# Patient Record
Sex: Female | Born: 1950 | Race: White | Hispanic: No | Marital: Single | Smoking: Current every day smoker
Health system: Southern US, Community
[De-identification: ages and names within clinical notes are randomized; demographics above are authoritative.]

## PROBLEM LIST (undated history)

## (undated) DIAGNOSIS — G459 Transient cerebral ischemic attack, unspecified: Secondary | ICD-10-CM

## (undated) DIAGNOSIS — K649 Unspecified hemorrhoids: Secondary | ICD-10-CM

## (undated) DIAGNOSIS — R0602 Shortness of breath: Secondary | ICD-10-CM

## (undated) DIAGNOSIS — G8929 Other chronic pain: Secondary | ICD-10-CM

## (undated) DIAGNOSIS — IMO0001 Reserved for inherently not codable concepts without codable children: Secondary | ICD-10-CM

## (undated) DIAGNOSIS — F419 Anxiety disorder, unspecified: Secondary | ICD-10-CM

## (undated) DIAGNOSIS — M1990 Unspecified osteoarthritis, unspecified site: Secondary | ICD-10-CM

## (undated) DIAGNOSIS — K219 Gastro-esophageal reflux disease without esophagitis: Secondary | ICD-10-CM

## (undated) DIAGNOSIS — C4490 Unspecified malignant neoplasm of skin, unspecified: Secondary | ICD-10-CM

## (undated) DIAGNOSIS — M797 Fibromyalgia: Secondary | ICD-10-CM

## (undated) DIAGNOSIS — J449 Chronic obstructive pulmonary disease, unspecified: Secondary | ICD-10-CM

## (undated) DIAGNOSIS — I10 Essential (primary) hypertension: Secondary | ICD-10-CM

## (undated) DIAGNOSIS — M549 Dorsalgia, unspecified: Secondary | ICD-10-CM

## (undated) HISTORY — DX: Reserved for inherently not codable concepts without codable children: IMO0001

## (undated) HISTORY — DX: Unspecified hemorrhoids: K64.9

## (undated) HISTORY — DX: Gastro-esophageal reflux disease without esophagitis: K21.9

## (undated) HISTORY — DX: Unspecified malignant neoplasm of skin, unspecified: C44.90

## (undated) HISTORY — DX: Dorsalgia, unspecified: M54.9

## (undated) HISTORY — DX: Other chronic pain: G89.29

## (undated) HISTORY — PX: ABDOMINAL HYSTERECTOMY: SHX81

## (undated) HISTORY — PX: OTHER SURGICAL HISTORY: SHX169

---

## 2001-01-14 ENCOUNTER — Encounter: Payer: Self-pay | Admitting: Emergency Medicine

## 2001-01-14 ENCOUNTER — Emergency Department (HOSPITAL_COMMUNITY): Admission: EM | Admit: 2001-01-14 | Discharge: 2001-01-14 | Payer: Self-pay | Admitting: Emergency Medicine

## 2004-10-16 ENCOUNTER — Emergency Department (HOSPITAL_COMMUNITY): Admission: EM | Admit: 2004-10-16 | Discharge: 2004-10-16 | Payer: Self-pay | Admitting: Emergency Medicine

## 2006-04-06 ENCOUNTER — Ambulatory Visit (HOSPITAL_COMMUNITY): Admission: RE | Admit: 2006-04-06 | Discharge: 2006-04-06 | Payer: Self-pay | Admitting: Orthopaedic Surgery

## 2006-04-06 ENCOUNTER — Encounter (INDEPENDENT_AMBULATORY_CARE_PROVIDER_SITE_OTHER): Payer: Self-pay | Admitting: Specialist

## 2008-05-03 ENCOUNTER — Emergency Department (HOSPITAL_COMMUNITY): Admission: EM | Admit: 2008-05-03 | Discharge: 2008-05-03 | Payer: Self-pay | Admitting: Emergency Medicine

## 2008-05-09 ENCOUNTER — Emergency Department (HOSPITAL_COMMUNITY): Admission: EM | Admit: 2008-05-09 | Discharge: 2008-05-09 | Payer: Self-pay | Admitting: Emergency Medicine

## 2008-05-13 ENCOUNTER — Ambulatory Visit (HOSPITAL_COMMUNITY)
Admission: RE | Admit: 2008-05-13 | Discharge: 2008-05-13 | Payer: Self-pay | Admitting: Physical Medicine and Rehabilitation

## 2008-05-21 ENCOUNTER — Ambulatory Visit (HOSPITAL_COMMUNITY)
Admission: RE | Admit: 2008-05-21 | Discharge: 2008-05-21 | Payer: Self-pay | Admitting: Physical Medicine and Rehabilitation

## 2008-05-29 ENCOUNTER — Ambulatory Visit (HOSPITAL_COMMUNITY): Admission: RE | Admit: 2008-05-29 | Discharge: 2008-05-31 | Payer: Self-pay | Admitting: Orthopedic Surgery

## 2008-06-05 ENCOUNTER — Emergency Department (HOSPITAL_COMMUNITY): Admission: EM | Admit: 2008-06-05 | Discharge: 2008-06-05 | Payer: Self-pay | Admitting: Emergency Medicine

## 2008-07-16 ENCOUNTER — Ambulatory Visit (HOSPITAL_COMMUNITY): Admission: RE | Admit: 2008-07-16 | Discharge: 2008-07-16 | Payer: Self-pay | Admitting: Orthopedic Surgery

## 2008-10-13 ENCOUNTER — Encounter
Admission: RE | Admit: 2008-10-13 | Discharge: 2008-10-13 | Payer: Self-pay | Admitting: Physical Medicine and Rehabilitation

## 2008-12-07 ENCOUNTER — Ambulatory Visit (HOSPITAL_COMMUNITY): Admission: RE | Admit: 2008-12-07 | Discharge: 2008-12-07 | Payer: Self-pay | Admitting: Family Medicine

## 2009-01-15 ENCOUNTER — Emergency Department (HOSPITAL_COMMUNITY): Admission: EM | Admit: 2009-01-15 | Discharge: 2009-01-16 | Payer: Self-pay | Admitting: Psychology

## 2009-01-17 ENCOUNTER — Emergency Department (HOSPITAL_COMMUNITY): Admission: EM | Admit: 2009-01-17 | Discharge: 2009-01-17 | Payer: Self-pay | Admitting: Emergency Medicine

## 2009-06-18 DIAGNOSIS — IMO0001 Reserved for inherently not codable concepts without codable children: Secondary | ICD-10-CM

## 2009-06-18 HISTORY — DX: Reserved for inherently not codable concepts without codable children: IMO0001

## 2009-06-27 ENCOUNTER — Ambulatory Visit: Payer: Self-pay | Admitting: Cardiology

## 2009-06-27 ENCOUNTER — Observation Stay (HOSPITAL_COMMUNITY): Admission: EM | Admit: 2009-06-27 | Discharge: 2009-06-28 | Payer: Self-pay | Admitting: Emergency Medicine

## 2009-06-28 ENCOUNTER — Ambulatory Visit: Payer: Self-pay | Admitting: Cardiology

## 2009-06-28 ENCOUNTER — Encounter (INDEPENDENT_AMBULATORY_CARE_PROVIDER_SITE_OTHER): Payer: Self-pay | Admitting: Internal Medicine

## 2009-06-29 ENCOUNTER — Encounter (HOSPITAL_COMMUNITY): Admission: RE | Admit: 2009-06-29 | Discharge: 2009-06-29 | Payer: Self-pay | Admitting: Cardiology

## 2009-06-29 ENCOUNTER — Ambulatory Visit: Payer: Self-pay | Admitting: Cardiology

## 2009-06-30 ENCOUNTER — Ambulatory Visit: Payer: Self-pay | Admitting: Cardiology

## 2009-09-28 ENCOUNTER — Emergency Department (HOSPITAL_COMMUNITY)
Admission: EM | Admit: 2009-09-28 | Discharge: 2009-09-28 | Payer: Self-pay | Source: Home / Self Care | Admitting: Emergency Medicine

## 2010-04-09 ENCOUNTER — Encounter: Payer: Self-pay | Admitting: Emergency Medicine

## 2010-04-10 ENCOUNTER — Encounter: Payer: Self-pay | Admitting: Family Medicine

## 2010-04-10 ENCOUNTER — Encounter: Payer: Self-pay | Admitting: Physical Medicine and Rehabilitation

## 2010-04-10 ENCOUNTER — Encounter: Payer: Self-pay | Admitting: Orthopedic Surgery

## 2010-04-11 ENCOUNTER — Encounter: Payer: Self-pay | Admitting: Family Medicine

## 2010-04-14 ENCOUNTER — Ambulatory Visit (HOSPITAL_COMMUNITY)
Admission: RE | Admit: 2010-04-14 | Discharge: 2010-04-14 | Payer: Self-pay | Source: Home / Self Care | Attending: Gastroenterology | Admitting: Gastroenterology

## 2010-04-21 ENCOUNTER — Other Ambulatory Visit: Payer: Self-pay | Admitting: Gastroenterology

## 2010-04-21 ENCOUNTER — Other Ambulatory Visit: Payer: Self-pay

## 2010-04-21 DIAGNOSIS — R928 Other abnormal and inconclusive findings on diagnostic imaging of breast: Secondary | ICD-10-CM

## 2010-04-27 ENCOUNTER — Ambulatory Visit (HOSPITAL_COMMUNITY): Payer: Self-pay

## 2010-04-27 ENCOUNTER — Encounter (HOSPITAL_COMMUNITY): Payer: Self-pay

## 2010-05-04 ENCOUNTER — Ambulatory Visit (HOSPITAL_COMMUNITY)
Admission: RE | Admit: 2010-05-04 | Discharge: 2010-05-04 | Disposition: A | Discharge status: Home / Self Care | Payer: Self-pay | Source: Ambulatory Visit | Attending: Gastroenterology | Admitting: Gastroenterology

## 2010-05-04 ENCOUNTER — Ambulatory Visit (HOSPITAL_COMMUNITY): Payer: Self-pay

## 2010-05-04 DIAGNOSIS — R928 Other abnormal and inconclusive findings on diagnostic imaging of breast: Secondary | ICD-10-CM | POA: Insufficient documentation

## 2010-05-04 DIAGNOSIS — N63 Unspecified lump in unspecified breast: Secondary | ICD-10-CM | POA: Insufficient documentation

## 2010-06-08 LAB — CBC
HCT: 41 % (ref 36.0–46.0)
Hemoglobin: 14 g/dL (ref 12.0–15.0)
MCHC: 34.1 g/dL (ref 30.0–36.0)
MCV: 95.1 fL (ref 78.0–100.0)
Platelets: 279 10*3/uL (ref 150–400)
RBC: 4.31 MIL/uL (ref 3.87–5.11)
RDW: 16.1 % — ABNORMAL HIGH (ref 11.5–15.5)
WBC: 11.5 10*3/uL — ABNORMAL HIGH (ref 4.0–10.5)

## 2010-06-08 LAB — DIFFERENTIAL
Basophils Absolute: 0.1 10*3/uL (ref 0.0–0.1)
Basophils Relative: 1 % (ref 0–1)
Eosinophils Absolute: 0.1 10*3/uL (ref 0.0–0.7)
Eosinophils Relative: 1 % (ref 0–5)
Lymphocytes Relative: 23 % (ref 12–46)
Lymphs Abs: 2.6 10*3/uL (ref 0.7–4.0)
Monocytes Absolute: 0.9 10*3/uL (ref 0.1–1.0)
Monocytes Relative: 8 % (ref 3–12)
Neutro Abs: 7.7 10*3/uL (ref 1.7–7.7)
Neutrophils Relative %: 67 % (ref 43–77)

## 2010-06-08 LAB — BASIC METABOLIC PANEL
BUN: 11 mg/dL (ref 6–23)
BUN: 12 mg/dL (ref 6–23)
CO2: 26 mEq/L (ref 19–32)
CO2: 27 mEq/L (ref 19–32)
Calcium: 8.6 mg/dL (ref 8.4–10.5)
Calcium: 8.8 mg/dL (ref 8.4–10.5)
Chloride: 107 mEq/L (ref 96–112)
Chloride: 96 mEq/L (ref 96–112)
Creatinine, Ser: 0.79 mg/dL (ref 0.4–1.2)
Creatinine, Ser: 1.13 mg/dL (ref 0.4–1.2)
GFR calc Af Amer: >60 mL/min (ref >60)
GFR calc Af Amer: >60 mL/min (ref >60)
GFR calc non Af Amer: 50 mL/min — ABNORMAL LOW (ref >60)
GFR calc non Af Amer: >60 mL/min (ref >60)
Glucose, Bld: 135 mg/dL — ABNORMAL HIGH (ref 70–99)
Glucose, Bld: 97 mg/dL (ref 70–99)
Potassium: 3.3 mEq/L — ABNORMAL LOW (ref 3.5–5.1)
Potassium: 4.2 mEq/L (ref 3.5–5.1)
Sodium: 133 mEq/L — ABNORMAL LOW (ref 135–145)
Sodium: 139 mEq/L (ref 135–145)

## 2010-06-08 LAB — LIPID PANEL
Cholesterol: 114 mg/dL (ref 0–200)
Cholesterol: 117 mg/dL (ref 0–200)
HDL: 36 mg/dL — ABNORMAL LOW (ref >39)
HDL: 54 mg/dL (ref >39)
LDL Cholesterol: 56 mg/dL (ref 0–99)
LDL Cholesterol: 70 mg/dL (ref 0–99)
Total CHOL/HDL Ratio: 2.2 RATIO
Total CHOL/HDL Ratio: 3.2 RATIO
Triglycerides: 36 mg/dL (ref <150)
Triglycerides: 39 mg/dL (ref <150)
VLDL: 7 mg/dL (ref 0–40)
VLDL: 8 mg/dL (ref 0–40)

## 2010-06-08 LAB — TROPONIN I: Troponin I: 0.02 ng/mL (ref 0.00–0.06)

## 2010-06-08 LAB — POCT CARDIAC MARKERS
CKMB, poc: <1 ng/mL — ABNORMAL LOW (ref 1.0–8.0)
CKMB, poc: <1 ng/mL — ABNORMAL LOW (ref 1.0–8.0)
Myoglobin, poc: 38.2 ng/mL (ref 12–200)
Myoglobin, poc: 67.5 ng/mL (ref 12–200)
Troponin i, poc: <0.05 ng/mL (ref 0.00–0.09)
Troponin i, poc: <0.05 ng/mL (ref 0.00–0.09)

## 2010-06-08 LAB — CARDIAC PANEL(CRET KIN+CKTOT+MB+TROPI)
CK, MB: 0.7 ng/mL (ref 0.3–4.0)
CK, MB: 0.9 ng/mL (ref 0.3–4.0)
Relative Index: INVALID (ref 0.0–2.5)
Relative Index: INVALID (ref 0.0–2.5)
Total CK: 26 U/L (ref 7–177)
Total CK: 26 U/L (ref 7–177)
Troponin I: 0.01 ng/mL (ref 0.00–0.06)
Troponin I: <0.01 ng/mL (ref 0.00–0.06)

## 2010-06-08 LAB — MAGNESIUM: Magnesium: 2.1 mg/dL (ref 1.5–2.5)

## 2010-06-08 LAB — D-DIMER, QUANTITATIVE: D-Dimer, Quant: 0.44 ug/mL-FEU (ref 0.00–0.48)

## 2010-06-08 LAB — CK TOTAL AND CKMB (NOT AT ARMC)
CK, MB: 0.7 ng/mL (ref 0.3–4.0)
Relative Index: INVALID (ref 0.0–2.5)
Total CK: 36 U/L (ref 7–177)

## 2010-06-08 LAB — BRAIN NATRIURETIC PEPTIDE: Pro B Natriuretic peptide (BNP): <30 pg/mL (ref 0.0–100.0)

## 2010-06-08 LAB — TSH: TSH: 1.478 u[IU]/mL (ref 0.350–4.500)

## 2010-06-23 LAB — BLOOD GAS, ARTERIAL
Acid-Base Excess: 2 mmol/L (ref 0.0–2.0)
Bicarbonate: 25.2 mEq/L — ABNORMAL HIGH (ref 20.0–24.0)
FIO2: 0.21 %
O2 Saturation: 97.5 %
Patient temperature: 37
pCO2 arterial: 34 mmHg — ABNORMAL LOW (ref 35.0–45.0)
pH, Arterial: 7.483 — ABNORMAL HIGH (ref 7.350–7.400)
pO2, Arterial: 94 mmHg (ref 80.0–100.0)

## 2010-06-23 LAB — BASIC METABOLIC PANEL
BUN: 13 mg/dL (ref 6–23)
CO2: 30 mEq/L (ref 19–32)
Calcium: 9.5 mg/dL (ref 8.4–10.5)
Chloride: 102 mEq/L (ref 96–112)
Creatinine, Ser: 1.05 mg/dL (ref 0.4–1.2)
GFR calc Af Amer: >60 mL/min (ref >60)
GFR calc non Af Amer: 54 mL/min — ABNORMAL LOW (ref >60)
Glucose, Bld: 109 mg/dL — ABNORMAL HIGH (ref 70–99)
Potassium: 3.5 mEq/L (ref 3.5–5.1)
Sodium: 137 mEq/L (ref 135–145)

## 2010-06-23 LAB — POCT CARDIAC MARKERS
CKMB, poc: <1 ng/mL — ABNORMAL LOW (ref 1.0–8.0)
Myoglobin, poc: 81.5 ng/mL (ref 12–200)
Troponin i, poc: <0.05 ng/mL (ref 0.00–0.09)

## 2010-06-23 LAB — CBC
HCT: 39.9 % (ref 36.0–46.0)
Hemoglobin: 13.6 g/dL (ref 12.0–15.0)
MCHC: 34.1 g/dL (ref 30.0–36.0)
MCV: 98.2 fL (ref 78.0–100.0)
Platelets: 264 10*3/uL (ref 150–400)
RBC: 4.06 MIL/uL (ref 3.87–5.11)
RDW: 15.4 % (ref 11.5–15.5)
WBC: 8.8 10*3/uL (ref 4.0–10.5)

## 2010-06-23 LAB — DIFFERENTIAL
Basophils Absolute: 0.1 10*3/uL (ref 0.0–0.1)
Basophils Relative: 1 % (ref 0–1)
Eosinophils Absolute: 0.1 10*3/uL (ref 0.0–0.7)
Eosinophils Relative: 2 % (ref 0–5)
Lymphocytes Relative: 31 % (ref 12–46)
Lymphs Abs: 2.7 10*3/uL (ref 0.7–4.0)
Monocytes Absolute: 0.6 10*3/uL (ref 0.1–1.0)
Monocytes Relative: 7 % (ref 3–12)
Neutro Abs: 5.3 10*3/uL (ref 1.7–7.7)
Neutrophils Relative %: 60 % (ref 43–77)

## 2010-06-23 LAB — D-DIMER, QUANTITATIVE: D-Dimer, Quant: 0.47 ug/mL-FEU (ref 0.00–0.48)

## 2010-06-30 LAB — URINE CULTURE
Colony Count: 5000
Special Requests: NEGATIVE

## 2010-06-30 LAB — URINE MICROSCOPIC-ADD ON

## 2010-06-30 LAB — CBC
HCT: 41.9 % (ref 36.0–46.0)
Hemoglobin: 13.8 g/dL (ref 12.0–15.0)
MCHC: 32.9 g/dL (ref 30.0–36.0)
MCV: 98.3 fL (ref 78.0–100.0)
Platelets: 259 10*3/uL (ref 150–400)
RBC: 4.27 MIL/uL (ref 3.87–5.11)
RDW: 16.4 % — ABNORMAL HIGH (ref 11.5–15.5)
WBC: 11.4 10*3/uL — ABNORMAL HIGH (ref 4.0–10.5)

## 2010-06-30 LAB — PROTIME-INR
INR: 0.9 (ref 0.00–1.49)
Prothrombin Time: 12.3 seconds (ref 11.6–15.2)

## 2010-06-30 LAB — COMPREHENSIVE METABOLIC PANEL
ALT: 12 U/L (ref 0–35)
AST: 8 U/L (ref 0–37)
Albumin: 3.9 g/dL (ref 3.5–5.2)
Alkaline Phosphatase: 67 U/L (ref 39–117)
BUN: 19 mg/dL (ref 6–23)
CO2: 29 mEq/L (ref 19–32)
Calcium: 9.3 mg/dL (ref 8.4–10.5)
Chloride: 106 mEq/L (ref 96–112)
Creatinine, Ser: 0.98 mg/dL (ref 0.4–1.2)
GFR calc Af Amer: >60 mL/min (ref >60)
GFR calc non Af Amer: 59 mL/min — ABNORMAL LOW (ref >60)
Glucose, Bld: 93 mg/dL (ref 70–99)
Potassium: 4.6 mEq/L (ref 3.5–5.1)
Sodium: 142 mEq/L (ref 135–145)
Total Bilirubin: 0.6 mg/dL (ref 0.3–1.2)
Total Protein: 6.5 g/dL (ref 6.0–8.3)

## 2010-06-30 LAB — DIFFERENTIAL
Basophils Absolute: 0.1 10*3/uL (ref 0.0–0.1)
Basophils Relative: 1 % (ref 0–1)
Eosinophils Absolute: 0.2 10*3/uL (ref 0.0–0.7)
Eosinophils Relative: 1 % (ref 0–5)
Lymphocytes Relative: 33 % (ref 12–46)
Lymphs Abs: 3.7 10*3/uL (ref 0.7–4.0)
Monocytes Absolute: 0.9 10*3/uL (ref 0.1–1.0)
Monocytes Relative: 8 % (ref 3–12)
Neutro Abs: 6.5 10*3/uL (ref 1.7–7.7)
Neutrophils Relative %: 58 % (ref 43–77)

## 2010-06-30 LAB — URINALYSIS, ROUTINE W REFLEX MICROSCOPIC
Bilirubin Urine: NEGATIVE
Glucose, UA: NEGATIVE mg/dL
Ketones, ur: NEGATIVE mg/dL
Leukocytes, UA: NEGATIVE
Nitrite: NEGATIVE
Protein, ur: NEGATIVE mg/dL
Specific Gravity, Urine: 1.015 (ref 1.005–1.030)
Urobilinogen, UA: 1 mg/dL (ref 0.0–1.0)
pH: 6 (ref 5.0–8.0)

## 2010-06-30 LAB — APTT: aPTT: 27 seconds (ref 24–37)

## 2010-07-05 LAB — COMPREHENSIVE METABOLIC PANEL
ALT: 28 U/L (ref 0–35)
AST: 33 U/L (ref 0–37)
Albumin: 3.4 g/dL — ABNORMAL LOW (ref 3.5–5.2)
Alkaline Phosphatase: 82 U/L (ref 39–117)
BUN: 20 mg/dL (ref 6–23)
CO2: 26 mEq/L (ref 19–32)
Calcium: 9.7 mg/dL (ref 8.4–10.5)
Chloride: 105 mEq/L (ref 96–112)
Creatinine, Ser: 0.98 mg/dL (ref 0.4–1.2)
GFR calc Af Amer: >60 mL/min (ref >60)
GFR calc non Af Amer: 59 mL/min — ABNORMAL LOW (ref >60)
Glucose, Bld: 111 mg/dL — ABNORMAL HIGH (ref 70–99)
Potassium: 4.1 mEq/L (ref 3.5–5.1)
Sodium: 141 mEq/L (ref 135–145)
Total Bilirubin: 0.2 mg/dL — ABNORMAL LOW (ref 0.3–1.2)
Total Protein: 6.3 g/dL (ref 6.0–8.3)

## 2010-07-05 LAB — DIFFERENTIAL
Basophils Absolute: 0.1 10*3/uL (ref 0.0–0.1)
Basophils Relative: 1 % (ref 0–1)
Eosinophils Absolute: 0.2 10*3/uL (ref 0.0–0.7)
Eosinophils Relative: 2 % (ref 0–5)
Lymphocytes Relative: 33 % (ref 12–46)
Lymphs Abs: 3 10*3/uL (ref 0.7–4.0)
Monocytes Absolute: 0.6 10*3/uL (ref 0.1–1.0)
Monocytes Relative: 7 % (ref 3–12)
Neutro Abs: 5.2 10*3/uL (ref 1.7–7.7)
Neutrophils Relative %: 57 % (ref 43–77)

## 2010-07-05 LAB — CBC
HCT: 38 % (ref 36.0–46.0)
Hemoglobin: 13 g/dL (ref 12.0–15.0)
MCHC: 34.2 g/dL (ref 30.0–36.0)
MCV: 96.6 fL (ref 78.0–100.0)
Platelets: 236 10*3/uL (ref 150–400)
RBC: 3.94 MIL/uL (ref 3.87–5.11)
RDW: 15.9 % — ABNORMAL HIGH (ref 11.5–15.5)
WBC: 9 10*3/uL (ref 4.0–10.5)

## 2010-07-05 LAB — URINALYSIS, ROUTINE W REFLEX MICROSCOPIC
Bilirubin Urine: NEGATIVE
Glucose, UA: NEGATIVE mg/dL
Ketones, ur: NEGATIVE mg/dL
Leukocytes, UA: NEGATIVE
Nitrite: NEGATIVE
Protein, ur: NEGATIVE mg/dL
Specific Gravity, Urine: <1.005 — ABNORMAL LOW (ref 1.005–1.030)
Urobilinogen, UA: 0.2 mg/dL (ref 0.0–1.0)
pH: 5 (ref 5.0–8.0)

## 2010-07-05 LAB — D-DIMER, QUANTITATIVE: D-Dimer, Quant: 0.47 ug/mL-FEU (ref 0.00–0.48)

## 2010-07-05 LAB — BRAIN NATRIURETIC PEPTIDE: Pro B Natriuretic peptide (BNP): <30 pg/mL (ref 0.0–100.0)

## 2010-07-05 LAB — URINE MICROSCOPIC-ADD ON

## 2010-08-02 NOTE — Op Note (Signed)
Paula Long, IPOCK               ACCOUNT NO.:  [id number]   MEDICAL RECORD NO.:  [id number]          PATIENT TYPE:  OIB   LOCATION:  1615                         FACILITY:  [facility]   PHYSICIAN:  Georges Lynch. Gioffre, M.D.DATE OF BIRTH:  13-May-1950   DATE OF PROCEDURE:  05/29/2008  DATE OF DISCHARGE:                               OPERATIVE REPORT   SURGEON:  Georges Lynch. Gioffre, MD.   ASSISTANT:  Marlowe Kays, MD.   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   OPERATIONS:  1. 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  2. Microdiskectomy at L4-5 on the left.   PROCEDURE:  Under general anesthesia, routine orthopedic prep and  draping of the lower back was carried out.  She had 2 grams of IV Ancef.  At this time 2 needles were placed in the back for localization purposes  and an x-ray was taken.  At this time, an incision was made over the L4-  5 interspace, bleeders identified and cauterized.  The muscle was  separated from the lamina and spinous processes bilaterally.  Another x-  ray was taken with the markers in place.  Following that, we noted that  there was marked overlapping of the L4-5 lamina so we decided to go  centrally because of the extremely large nature of the disk and the  obliteration of the canal, so we went down and carried out our central  decompressive lumbar laminectomy in the usual fashion.  We brought the  microscope in and then gently removed the ligamentum flavum.  We then  identified the dura, we protected the dura with cottonoids, went out  laterally on the left at L4-5 to decompress the lateral recess.  We  cauterized lateral recess veins with the bipolar.  Once this was  done,  we identified the L5 root, gently retracted it and prior to doing that  we did a foraminotomy for the root but the root was quite swollen and  tense.  We gently moved the dura and held the dura in place with the  D'Errico retractor, made a cruciate incision in the posterior  longitudinal ligament and the disk literally exploded out through the  incision site.  We then utilized the nerve hooks and the Epstein  curettes and removed all the subligamentous disk rupture, compressed it  down into the disk space and completed our diskectomy.  We made multiple  passes into the space and into the subligamental space.  Once the  diskectomy was completed, we were able to easily get a hockey-stick  proximally, distally, medially and laterally.  The nerve root was free,  the dura was free, we had a nice decompression.  We thoroughly irrigated  out the area.  I injected about 10 mL of FloSeal into the wound site and  loosely applied some thrombin-soaked Gelfoam.  The wound was closed in  layered usual fashion except the deep distal part of the wound was left  partially open for drainage purposes.  Subcu was closed with 0 Vicryl,  the skin was closed with metal staples and a sterile Neosporin dressing  was applied.  She left the operating  room in satisfactory condition.  Estimated blood loss, by anesthesia,  was 75 mL.  She was placed on 2 grams IV Ancef preop and she will be  followed closely.  I will maintain her with PAS hose starting in the  recovery room and then start her on low-dose aspirin.           ______________________________  Georges Lynch. [name], M.D.     RAG/MEDQ  D:  05/29/2008  T:  05/30/2008  Job:  [id number]

## 2010-08-05 NOTE — H&P (Signed)
NAME:  Paula Long, Paula Long               ACCOUNT NO.:  [id number]   MEDICAL RECORD NO.:  [id number]          PATIENT TYPE:  AMB   LOCATION:  DAY                           FACILITY:  [facility]   PHYSICIAN:  J. Darreld Mclean, M.D. DATE OF BIRTH:  [birth year]   DATE OF ADMISSION:  DATE OF DISCHARGE:  LH                              HISTORY & PHYSICAL   CHIEF COMPLAINT:  Right hand pain and locking of the long finger.   The patient is a 60 year old female with pain and tenderness in her  right hand with locking of the right long finger. It has been present  for several months, getting progressively worse. Locks particularly  early in the morning. She has inability to completely flex the finger.  She also has a small ganglion cyst over the radial artery on the radial  side of the hand. She denies any trauma. It has been getting  progressively worse.   PAST HISTORY:  Negative. Denies any heart disease, lung disease, kidney  disease, stroke, paralysis, weakness, hypertension, diabetes, cancer,  ulcer disease, circulatory problems. She denies any allergies. She is  currently taking no medications. The patient does smoke. She does not  use alcoholic beverages. There is no family doctor. Status post ankle  surgery and D&C in the past. History of lung cancer runs in the family;  father had this. The patient lives in [place] and is not married.   VITAL SIGNS:  BP 136/82, pulse 80, respirations 16, afebrile. Height 5  foot 5, weight 179.  The patient is alert, cooperative and oriented.  HEENT:  Negative.  NECK:  Supple.  LUNGS:  Clear to P&A.  HEART:  Regular without murmur heard.  ABDOMEN:  Soft, nontender, without masses.  EXTREMITIES:  Right hand:  She has got inability to fully extend the  right long finger and difficulty with flexion. There is pain over the A1  pulley. There is some popping of the finger. There is a small ganglion  cyst the size of English peas over the right volar wrist.  Other  extremities are within normal limits.  CENTRAL NERVOUS SYSTEM:  Intact.  SKIN:  Intact.   IMPRESSION:  Trigger finger (stenosing tenosynovitis), right long  finger. Small ganglion cyst, right wrist.   She does not desire anything done to the ganglion cyst other than  possible aspiration.   The plan is to release the A1 pulley. We discussed with the patient the  planned procedure, risks and imponderables. Labs are pending.                                            ______________________________  J. Darreld Mclean, M.D.     JWK/MEDQ  D:  04/05/2006  T:  04/05/2006  Job:  [id number]

## 2010-08-05 NOTE — Op Note (Signed)
NAME:  Paula Long, GEISSINGER               ACCOUNT NO.:  [id number]   MEDICAL RECORD NO.:  [id number]          PATIENT TYPE:  AMB   LOCATION:  DAY                           FACILITY:  [facility]   PHYSICIAN:  J. Darreld Mclean, M.D. DATE OF BIRTH:  March 03, 1951   DATE OF PROCEDURE:  04/06/2006  DATE OF DISCHARGE:                               OPERATIVE REPORT   PREOPERATIVE DIAGNOSIS:  Trigger finger, right long finger, and ganglion  cyst, right volar wrist.   POSTOPERATIVE DIAGNOSIS:  Trigger finger, right long finger, and  ganglion cyst, right volar wrist.   PROCEDURE:  Release A1 pulley, right long finger, and aspiration of  ganglion cyst.   ANESTHESIA:  Bier block. Please refer to anesthesia record for  tourniquet time.   SURGEON:  Keeling.   No drains.   INDICATIONS:  The patient is a 60 year old female with locking of the  right long finger. It has been present for some time, and conservative  treatment has not been successful. She also has a ganglion cyst. She  does not want it surgically removed. I told her I would aspirate that  while we were doing the procedure for the finger. She is agreeable to  that. She understands the cyst could reoccur. Risks and imponderables of  the procedure were discussed preoperatively. She asked appropriate  questions. She appears to understand.   DESCRIPTION OF PROCEDURE:  The patient was seen in the holding area. The  right hand and right long finger was identified as the correct surgical  site. I placed a mark on the palm of the right long finger. She had  placed a mark. She was taken to the operating room. She was given Bier  block anesthesia while supine on the operating room table, hand table  attached. We had a time out reidentifying Ms. Priddy as the patient and  the right long finger as the correct surgical site. Once anesthesia had  been given, Bier block, and the patient was prepped and draped in the  usual manner. I performed a small  incision over the A1 pulley. With  careful dissection, the A1 pulley was identified. The neurovascular  bundles were identified and protected and retracted. Incision was made  in the A1 pulley with a small Beaver blade, and the volar portion of the  pulley was removed. There  __________ changes on the tendon. There was  also a small ganglion cyst coming from the area. This was removed.  Tendon sheath was opened proximally and distally, and then the finger  was extended and flexed, and there was no further locking. Neurovascular  bundles were inspected and no apparent injury. Wounds were approximated  using 3-0 nylon interrupted vertical mattress manner. Put Marcaine 0.25%  in the wound. Attention was directed to the small ganglion cyst. I  aspirated that using an 18-gauge needle. Obtained approximately 1.5 cc  of fluid, real thick  gelatinous type. Sterile dressing was applied. Bulky dressing applied.  Ace bandage applied loosely. The patient tolerated the procedure well.  Prescription for Vicodin ES given for pain. Will see in the office on  Monday for a dressing change and then approximately 10 to 2 weeks for  suture removal.           ______________________________  J. Darreld Mclean, M.D.     JWK/MEDQ  D:  04/06/2006  T:  04/06/2006  Job:  [id number]

## 2010-12-16 ENCOUNTER — Emergency Department (HOSPITAL_COMMUNITY): Payer: Medicare Other

## 2010-12-16 ENCOUNTER — Emergency Department (HOSPITAL_COMMUNITY)
Admission: EM | Admit: 2010-12-16 | Discharge: 2010-12-16 | Disposition: A | Discharge status: Home / Self Care | Payer: Medicare Other | Attending: Emergency Medicine | Admitting: Emergency Medicine

## 2010-12-16 ENCOUNTER — Encounter: Payer: Self-pay | Admitting: *Deleted

## 2010-12-16 DIAGNOSIS — M25519 Pain in unspecified shoulder: Secondary | ICD-10-CM | POA: Insufficient documentation

## 2010-12-16 DIAGNOSIS — M25511 Pain in right shoulder: Secondary | ICD-10-CM

## 2010-12-16 DIAGNOSIS — M79609 Pain in unspecified limb: Secondary | ICD-10-CM | POA: Insufficient documentation

## 2010-12-16 DIAGNOSIS — F17200 Nicotine dependence, unspecified, uncomplicated: Secondary | ICD-10-CM | POA: Insufficient documentation

## 2010-12-16 DIAGNOSIS — J4489 Other specified chronic obstructive pulmonary disease: Secondary | ICD-10-CM | POA: Insufficient documentation

## 2010-12-16 DIAGNOSIS — M542 Cervicalgia: Secondary | ICD-10-CM | POA: Insufficient documentation

## 2010-12-16 DIAGNOSIS — J449 Chronic obstructive pulmonary disease, unspecified: Secondary | ICD-10-CM | POA: Insufficient documentation

## 2010-12-16 HISTORY — DX: Chronic obstructive pulmonary disease, unspecified: J44.9

## 2010-12-16 MED ORDER — CYCLOBENZAPRINE HCL 10 MG PO TABS: 10.0000 mg | ORAL_TABLET | Freq: Three times a day (TID) | ORAL | Status: AC | PRN

## 2010-12-16 MED ORDER — DICLOFENAC SODIUM 75 MG PO TBEC: 75.0000 mg | DELAYED_RELEASE_TABLET | Freq: Two times a day (BID) | ORAL | Status: DC

## 2010-12-16 MED ORDER — MORPHINE SULFATE 10 MG/ML IJ SOLN
8.0000 mg | Freq: Once | INTRAMUSCULAR | Status: AC
Start: 2010-12-16 — End: 2010-12-16
  Administered 2010-12-16: 8 mg via INTRAMUSCULAR
  Filled 2010-12-16: qty 1

## 2010-12-16 NOTE — ED Provider Notes (Signed)
History     CSN: [id number] Arrival date & time: 12/16/2010  2:59 PM  Chief Complaint  Patient presents with  . Arm Pain    right arm    (Consider location/radiation/quality/duration/timing/severity/associated sxs/prior treatment) HPI Comments: Patient c/o pain to the right shoulder for one month.  States pain s increasing and worse with over head movements.  States the pain ranges from her right upper arm into the shoulder and to the right side of her neck.  She denies numbness, weakness, headaches, chest pain or visual changes.    Patient is a 60 y.o. female presenting with shoulder pain. The history is provided by the patient.  Shoulder Pain This is a chronic problem. The current episode started more than 1 month ago. The problem occurs constantly. The problem has been gradually worsening. Associated symptoms include neck pain. Pertinent negatives include no chest pain, fever, headaches, joint swelling, numbness, rash, sore throat, swollen glands, visual change, vomiting or weakness. Exacerbated by: movement , palpation. She has tried NSAIDs and acetaminophen for the symptoms. The treatment provided no relief.    Past Medical History  Diagnosis Date  . COPD (chronic obstructive pulmonary disease)     Past Surgical History  Procedure Date  . Abdominal hysterectomy   . Left ankle surgery   . Right wrist surgery for pinched nerve   . Trigger finger surgery   . Back surgery     History reviewed. No pertinent family history.  History  Substance Use Topics  . Smoking status: Current Everyday Smoker  . Smokeless tobacco: Not on file  . Alcohol Use: No    OB History    Grav Para Term Preterm Abortions TAB SAB Ect Mult Living                  Review of Systems  Constitutional: Negative for fever.  HENT: Positive for neck pain. Negative for sore throat and neck stiffness.   Eyes: Negative for visual disturbance.  Cardiovascular: Negative for chest pain.    Gastrointestinal: Negative for vomiting.  Musculoskeletal: Negative for back pain and joint swelling.  Skin: Negative.  Negative for rash.  Neurological: Negative for dizziness, facial asymmetry, weakness, numbness and headaches.  Hematological: Negative for adenopathy. Does not bruise/bleed easily.  All other systems reviewed and are negative.    Allergies  Percocet and Vicodin  Home Medications   Current Outpatient Rx  Name Route Sig Dispense Refill  . ACETAMINOPHEN 500 MG PO TABS Oral Take 1,000 mg by mouth as needed. For pain     . IBUPROFEN 200 MG PO TABS Oral Take 800 mg by mouth daily as needed. For pain       BP 146/81  Pulse 82  Temp(Src) 98.2 F (36.8 C) (Oral)  Resp 16  Ht 5\' 6"  (1.676 m)  Wt 173 lb (78.472 kg)  BMI 27.92 kg/m2  SpO2 98%  Physical Exam  Nursing note and vitals reviewed. Constitutional: She is oriented to person, place, and time. She appears well-developed and well-nourished. No distress.  HENT:  Head: Normocephalic and atraumatic.  Eyes: EOM are normal.  Neck: Trachea normal and normal range of motion. Neck supple. Spinous process tenderness and muscular tenderness present. No rigidity. No edema, no erythema and normal range of motion present. No Brudzinski's sign and no Kernig's sign noted.  Cardiovascular: Normal rate, regular rhythm and normal heart sounds.   Pulmonary/Chest: Effort normal and breath sounds normal.  Musculoskeletal: She exhibits tenderness. She exhibits no edema.  Right shoulder: She exhibits tenderness and pain. She exhibits normal range of motion, no bony tenderness, no swelling, no effusion, no crepitus, no deformity, no laceration, normal pulse and normal strength.  Lymphadenopathy:    She has no cervical adenopathy.  Neurological: She is alert and oriented to person, place, and time. She has normal reflexes. No cranial nerve deficit or sensory deficit. She exhibits normal muscle tone. Coordination and gait normal.   Reflex Scores:      Tricep reflexes are 2+ on the right side and 2+ on the left side.      Bicep reflexes are 2+ on the right side and 2+ on the left side.      Brachioradialis reflexes are 2+ on the right side and 2+ on the left side. Skin: Skin is warm and dry.    ED Course  Procedures (including critical care time)  Dg Cervical Spine Complete  12/16/2010  *RADIOLOGY REPORT*  Clinical Data: Neck pain  CERVICAL SPINE - COMPLETE 4+ VIEW  Comparison: None  Findings: Normal alignment of the cervical spine.  Vertebral body heights are well preserved.  Prevertebral soft tissue space is normal.  Mild disc space narrowing and endplate spurring noted at C5-6.  Neuroforamina appear patent.  No fractures or subluxations identified.  IMPRESSION:  1.  No acute findings. 2.  Mild degenerative disc disease.  Original Report Authenticated By: Rosealee Albee, M.D.   Dg Shoulder Right  12/16/2010  *RADIOLOGY REPORT*  Clinical Data: Right shoulder pain.  RIGHT SHOULDER - 2+ VIEW  Comparison: None  Findings: The joint spaces are maintained.  Mild AC joint degenerative changes.  Mild lateral downsloping of the acromion. No acute fracture.  The right lung apex is clear.  No upper right rib fractures.  IMPRESSION: No acute bony findings.  Original Report Authenticated By: P. Loralie Champagne, M.D.        MDM    Patient has ttp of the right [facility] joint and right trapezius muscles.  Has full ROM of the shoulder, pain is reproduced with full abduction and external rotation, distal sensation intact,  CR<2 sec.  Radial pulse is strong and equal bilaterally.  Diff dx would include rotator cuff syndrome vs cervical radiculopathy.  No focal neuro deficits, speech impairment  or motor weakness to suggest TIA.    Patient requests referral to f/u with Dr. Hilda Lias.    12/16/2010 Pt feels improved after observation and/or treatment in ED.   Patient / Family / Caregiver understand and agree with initial ED impression and  plan with expectations set for ED visit.         Channing Savich L. [place], [place] 12/16/10 1717

## 2010-12-16 NOTE — ED Provider Notes (Signed)
Medical screening examination/treatment/procedure(s) were performed by non-physician practitioner and as supervising physician I was immediately available for consultation/collaboration.   Dayton Bailiff, MD 12/16/10 [phone]

## 2010-12-16 NOTE — ED Notes (Signed)
Pt c/o right arm pain starting on the upper arm radiating around to shoulder and up to neck and down front to collar bone; pt states the pain has been going on for about a month and denies any injury

## 2010-12-16 NOTE — ED Notes (Signed)
Pt a/ox4. Resp even and unlabored. NAD at this time. D/C instructions and Rx x2  reviewed with pt. Pt verbalized understanding. Pt ambulated with steady gate. Sister with pt to transport home.

## 2011-06-17 ENCOUNTER — Emergency Department (HOSPITAL_COMMUNITY)
Admission: EM | Admit: 2011-06-17 | Discharge: 2011-06-17 | Disposition: A | Discharge status: Home / Self Care | Payer: Medicare Other | Attending: Emergency Medicine | Admitting: Emergency Medicine

## 2011-06-17 ENCOUNTER — Encounter (HOSPITAL_COMMUNITY): Payer: Self-pay | Admitting: *Deleted

## 2011-06-17 DIAGNOSIS — F17200 Nicotine dependence, unspecified, uncomplicated: Secondary | ICD-10-CM | POA: Insufficient documentation

## 2011-06-17 DIAGNOSIS — J4489 Other specified chronic obstructive pulmonary disease: Secondary | ICD-10-CM | POA: Insufficient documentation

## 2011-06-17 DIAGNOSIS — Z886 Allergy status to analgesic agent status: Secondary | ICD-10-CM | POA: Insufficient documentation

## 2011-06-17 DIAGNOSIS — I10 Essential (primary) hypertension: Secondary | ICD-10-CM | POA: Insufficient documentation

## 2011-06-17 DIAGNOSIS — M5430 Sciatica, unspecified side: Secondary | ICD-10-CM

## 2011-06-17 DIAGNOSIS — G8929 Other chronic pain: Secondary | ICD-10-CM | POA: Insufficient documentation

## 2011-06-17 DIAGNOSIS — J449 Chronic obstructive pulmonary disease, unspecified: Secondary | ICD-10-CM | POA: Insufficient documentation

## 2011-06-17 HISTORY — DX: Essential (primary) hypertension: I10

## 2011-06-17 MED ORDER — METHOCARBAMOL 500 MG PO TABS: ORAL_TABLET | ORAL | Status: DC

## 2011-06-17 MED ORDER — ONDANSETRON 8 MG PO TBDP
8.0000 mg | ORAL_TABLET | Freq: Once | ORAL | Status: AC
  Administered 2011-06-17: 8 mg via ORAL
  Filled 2011-06-17: qty 1

## 2011-06-17 MED ORDER — OXYCODONE-ACETAMINOPHEN 5-325 MG PO TABS: 1.0000 | ORAL_TABLET | ORAL | Status: AC | PRN

## 2011-06-17 MED ORDER — MAGIC MOUTHWASH: ORAL | Status: DC

## 2011-06-17 MED ORDER — HYDROMORPHONE HCL PF 1 MG/ML IJ SOLN
1.0000 mg | Freq: Once | INTRAMUSCULAR | Status: AC
  Administered 2011-06-17: 1 mg via INTRAMUSCULAR
  Filled 2011-06-17: qty 1

## 2011-06-17 NOTE — ED Provider Notes (Signed)
History     CSN: [id number]  Arrival date & time 06/17/11  1546   First MD Initiated Contact with Patient 06/17/11 1603      Chief Complaint  Patient presents with  . Leg Pain    (Consider location/radiation/quality/duration/timing/severity/associated sxs/prior treatment) HPI Comments: Patient with hx of chronic low back pain c/o increasing pain to her left lower back for 2-3 weeks.  States the pain has become worse today and radiating into her left thigh and knee.  States the pain has radiated into her left leg in the past.  States the pain is similar to the intense pain she felt in 2010 prior to her previous back surgery.  She denies fall or recent injury.  She denies weakness, numbness, incontinence or feces or urine, or abd pain.  States she has appt with her neurologist, Dr. Ethelene Hal, on June 26, 2011  Patient is a 61 y.o. female presenting with back pain. The history is provided by the patient. No language interpreter was used.  Back Pain  This is a chronic problem. The current episode started more than 1 week ago. The problem occurs constantly. The problem has been gradually worsening. The pain is associated with no known injury. The pain is present in the lumbar spine and sacro-iliac joint. The quality of the pain is described as aching and shooting. The pain radiates to the left thigh and left knee. The pain is moderate. The symptoms are aggravated by twisting, certain positions and bending. The pain is the same all the time. Associated symptoms include leg pain and tingling. Pertinent negatives include no chest pain, no fever, no numbness, no abdominal pain, no abdominal swelling, no bowel incontinence, no perianal numbness, no bladder incontinence, no dysuria, no pelvic pain, no paresthesias, no paresis and no weakness. She has tried NSAIDs (tylenol) for the symptoms. The treatment provided no relief.    Past Medical History  Diagnosis Date  . COPD (chronic obstructive pulmonary  disease)   . Hypertension     Past Surgical History  Procedure Date  . Abdominal hysterectomy   . Left ankle surgery   . Right wrist surgery for pinched nerve   . Trigger finger surgery   . Back surgery     History reviewed. No pertinent family history.  History  Substance Use Topics  . Smoking status: Current Everyday Smoker    Types: Cigarettes  . Smokeless tobacco: Not on file  . Alcohol Use: No    OB History    Grav Para Term Preterm Abortions TAB SAB Ect Mult Living                  Review of Systems  Constitutional: Negative for fever.  Cardiovascular: Negative for chest pain.  Gastrointestinal: Negative for abdominal pain and bowel incontinence.  Genitourinary: Negative for bladder incontinence, dysuria and pelvic pain.  Musculoskeletal: Positive for back pain.  Neurological: Positive for tingling. Negative for weakness, numbness and paresthesias.    Allergies  Percocet and Vicodin  Home Medications   Current Outpatient Rx  Name Route Sig Dispense Refill  . ACETAMINOPHEN 500 MG PO TABS Oral Take 1,000 mg by mouth as needed. For pain     . DICLOFENAC SODIUM 75 MG PO TBEC Oral Take 1 tablet (75 mg total) by mouth 2 (two) times daily. Prn pain.  Take with food 20 tablet 0  . IBUPROFEN 200 MG PO TABS Oral Take 800 mg by mouth daily as needed. For pain  BP 155/72  Pulse 90  Temp(Src) 97.5 F (36.4 C) (Oral)  Resp 18  Ht 5\' 6"  (1.676 m)  Wt 180 lb (81.647 kg)  BMI 29.05 kg/m2  SpO2 99%  Physical Exam  Nursing note and vitals reviewed. Constitutional: She is oriented to person, place, and time. She appears well-developed and well-nourished. No distress.  HENT:  Head: Normocephalic and atraumatic.  Neck: Normal range of motion. Neck supple.  Cardiovascular: Normal rate, regular rhythm and normal heart sounds.   Pulmonary/Chest: Effort normal and breath sounds normal. No respiratory distress.  Abdominal: Soft. She exhibits no distension. There is  no tenderness.  Musculoskeletal: Normal range of motion. She exhibits tenderness. She exhibits no edema.       Lumbar back: She exhibits tenderness and pain. She exhibits normal range of motion, no bony tenderness, no swelling, no edema, no deformity and normal pulse.       Back:  Neurological: She is alert and oriented to person, place, and time. No sensory deficit. She exhibits normal muscle tone. Coordination and gait normal.  Reflex Scores:      Patellar reflexes are 2+ on the right side and 2+ on the left side.      Achilles reflexes are 2+ on the right side and 2+ on the left side. Skin: Skin is warm and dry.    ED Course  Procedures (including critical care time)       MDM  I have reviewed previous ED charts and nursing notes  Patient ambulates with a cane but has a steady gait.  No focal neuro deficits on exam.  ttp of the left lumbar paraspinal muscles and left SI joint space.  Patient has hx of chronic low back pain and has appt with Dr. Ethelene Hal next week.  I will prescribe a short course of pain medication and a muscle relaxer.    Patient / Family / Caregiver understand and agree with initial ED impression and plan with expectations set for ED visit. Pt stable in ED with no significant deterioration in condition. Pt feels improved after observation and/or treatment in ED.        Paytan Recine L. [place], [place] 06/18/11 [phone]

## 2011-06-17 NOTE — ED Notes (Signed)
Left leg pain x 2-3 weeks, but today pain is unbearable per pt, denies any injury

## 2011-06-17 NOTE — Discharge Instructions (Signed)

## 2011-06-18 NOTE — ED Provider Notes (Signed)
Medical screening examination/treatment/procedure(s) were performed by non-physician practitioner and as supervising physician I was immediately available for consultation/collaboration.  Rick Warnick, MD 06/18/11 1519 

## 2011-08-04 ENCOUNTER — Other Ambulatory Visit (HOSPITAL_COMMUNITY): Payer: Self-pay | Admitting: Physical Medicine and Rehabilitation

## 2011-08-04 DIAGNOSIS — M545 Low back pain: Secondary | ICD-10-CM

## 2011-08-08 ENCOUNTER — Ambulatory Visit (HOSPITAL_COMMUNITY): Payer: Medicare Other

## 2011-08-09 ENCOUNTER — Ambulatory Visit (HOSPITAL_COMMUNITY)
Admission: RE | Admit: 2011-08-09 | Discharge: 2011-08-09 | Disposition: A | Discharge status: Home / Self Care | Payer: Medicare Other | Source: Ambulatory Visit | Attending: Physical Medicine and Rehabilitation | Admitting: Physical Medicine and Rehabilitation

## 2011-08-09 DIAGNOSIS — M545 Low back pain, unspecified: Secondary | ICD-10-CM | POA: Insufficient documentation

## 2011-08-09 DIAGNOSIS — M79609 Pain in unspecified limb: Secondary | ICD-10-CM | POA: Insufficient documentation

## 2011-08-09 DIAGNOSIS — M5126 Other intervertebral disc displacement, lumbar region: Secondary | ICD-10-CM | POA: Insufficient documentation

## 2011-08-09 LAB — POCT I-STAT, CHEM 8
BUN: 10 mg/dL (ref 6–23)
Calcium, Ion: 1.16 mmol/L (ref 1.12–1.32)
Chloride: 107 mEq/L (ref 96–112)
Creatinine, Ser: 1 mg/dL (ref 0.50–1.10)
Glucose, Bld: 96 mg/dL (ref 70–99)
HCT: 44 % (ref 36.0–46.0)
Hemoglobin: 15 g/dL (ref 12.0–15.0)
Potassium: 4.1 mEq/L (ref 3.5–5.1)
Sodium: 138 mEq/L (ref 135–145)
TCO2: 22 mmol/L (ref 0–100)

## 2011-08-09 MED ORDER — GADOBENATE DIMEGLUMINE 529 MG/ML IV SOLN: 20.0000 mL | Freq: Once | INTRAVENOUS | Status: AC | PRN

## 2011-10-26 ENCOUNTER — Ambulatory Visit: Payer: Medicare Other | Admitting: Family Medicine

## 2011-10-30 ENCOUNTER — Encounter: Payer: Self-pay | Admitting: Family Medicine

## 2011-10-30 ENCOUNTER — Ambulatory Visit (INDEPENDENT_AMBULATORY_CARE_PROVIDER_SITE_OTHER): Payer: Medicare Other | Admitting: Family Medicine

## 2011-10-30 VITALS — BP 134/76 | HR 91 | Resp 18 | Ht 65.0 in | Wt 208.0 lb

## 2011-10-30 DIAGNOSIS — E669 Obesity, unspecified: Secondary | ICD-10-CM

## 2011-10-30 DIAGNOSIS — F17210 Nicotine dependence, cigarettes, uncomplicated: Secondary | ICD-10-CM | POA: Insufficient documentation

## 2011-10-30 DIAGNOSIS — R7303 Prediabetes: Secondary | ICD-10-CM

## 2011-10-30 DIAGNOSIS — I10 Essential (primary) hypertension: Secondary | ICD-10-CM

## 2011-10-30 DIAGNOSIS — J449 Chronic obstructive pulmonary disease, unspecified: Secondary | ICD-10-CM | POA: Insufficient documentation

## 2011-10-30 DIAGNOSIS — R7309 Other abnormal glucose: Secondary | ICD-10-CM

## 2011-10-30 DIAGNOSIS — R928 Other abnormal and inconclusive findings on diagnostic imaging of breast: Secondary | ICD-10-CM

## 2011-10-30 DIAGNOSIS — G8929 Other chronic pain: Secondary | ICD-10-CM

## 2011-10-30 DIAGNOSIS — Z720 Tobacco use: Secondary | ICD-10-CM | POA: Insufficient documentation

## 2011-10-30 DIAGNOSIS — M549 Dorsalgia, unspecified: Secondary | ICD-10-CM

## 2011-10-30 DIAGNOSIS — F17200 Nicotine dependence, unspecified, uncomplicated: Secondary | ICD-10-CM

## 2011-10-30 DIAGNOSIS — K219 Gastro-esophageal reflux disease without esophagitis: Secondary | ICD-10-CM | POA: Insufficient documentation

## 2011-10-30 MED ORDER — ALBUTEROL SULFATE HFA 108 (90 BASE) MCG/ACT IN AERS: 2.0000 | INHALATION_SPRAY | RESPIRATORY_TRACT | Status: DC | PRN

## 2011-10-30 MED ORDER — ESOMEPRAZOLE MAGNESIUM 40 MG PO CPDR: 40.0000 mg | DELAYED_RELEASE_CAPSULE | Freq: Every day | ORAL | Status: DC

## 2011-10-30 MED ORDER — FLUTICASONE-SALMETEROL 250-50 MCG/DOSE IN AEPB: 1.0000 | INHALATION_SPRAY | Freq: Two times a day (BID) | RESPIRATORY_TRACT | Status: DC

## 2011-10-30 NOTE — Assessment & Plan Note (Signed)
Counseled on cessation, she has used patches in the past

## 2011-10-30 NOTE — Addendum Note (Signed)
Addended by: Milinda Antis F on: 10/30/2011 09:30 PM   Modules accepted: Orders

## 2011-10-30 NOTE — Assessment & Plan Note (Signed)
Send for repeat Mammogram as recommended with multiple nodular densities

## 2011-10-30 NOTE — Assessment & Plan Note (Signed)
Followed by pain clinic, has radicular symptoms with paresthesia

## 2011-10-30 NOTE — Assessment & Plan Note (Signed)
Labs, monitor off meds, currently bp looks good

## 2011-10-30 NOTE — Assessment & Plan Note (Signed)
Per report she has gained 75 pounds since her back surgery in 2010

## 2011-10-30 NOTE — Assessment & Plan Note (Signed)
Restart nexium 

## 2011-10-30 NOTE — Patient Instructions (Signed)
Get the labs done fasting Restart nexium You need to quit smoking! Restart advair and albuterol  I will get records  F/U 1 week for labs

## 2011-10-30 NOTE — Assessment & Plan Note (Signed)
Restart advair and albuterol, nebs as needed for flares

## 2011-10-30 NOTE — Progress Notes (Signed)
  Subjective:    Patient ID: Paula Long, female    DOB: September 13, 1950, 61 y.o.   MRN: [id number]  HPI Pt here to establish care, last PCP Dr. Loney Hering [facility]- Dr. Nobie Putnam- Dr. Ethelene Hal Medications and history reviewed Chronic back pain followed by pain clinic, on Opana, has had back surgery in the past, has radicular symptoms down left side with numbness in left foot 1st and 2nd digits for past 4 months. Pt on Disability since 2011, back surgery in 2010. Borderline diabetic, HTN with no current medications.  COPD- was on advair and albuterol but no insurance to get meds, smokes 2ppd, has wheezing episodes a few times a week Stress test done in 2011 secondary to SOB work-up- negative stress test Abnormal Mammogram- has multiple nodular densities in breast being followed, overdue for Mammogram  Review of Systems - per above    GEN- denies fatigue, fever, weight loss,weakness, recent illness HEENT- denies eye drainage, change in vision, nasal discharge, CVS- denies chest pain, palpitations RESP- denies SOB,+ cough,+ wheeze ABD- denies N/V, change in stools, abd pain GU- denies dysuria, hematuria, dribbling, incontinence MSK- + joint pain, muscle aches, injury Neuro- denies headache, dizziness, syncope, seizure activity      Objective:   Physical Exam GEN- NAD, alert and oriented x3, obesity HEENT- PERRL, EOMI, non injected sclera, pink conjunctiva, MMM, oropharynx clear Neck- Supple,  CVS- RRR, no murmur RESP-CTAB ABD-NABS,soft,NT,ND EXT- No edema Neuro- decreased sensation in left foot 1st and 2nd digits, small ulcerative callus seen on left sole x 2 , bunion of left foot Pulses- Radial, DP- 2+ Antalgic gait       Assessment & Plan:

## 2011-10-31 LAB — CBC
HCT: 43.2 % (ref 36.0–46.0)
Hemoglobin: 14.8 g/dL (ref 12.0–15.0)
MCH: 31.5 pg (ref 26.0–34.0)
MCHC: 34.3 g/dL (ref 30.0–36.0)
MCV: 91.9 fL (ref 78.0–100.0)
Platelets: 317 10*3/uL (ref 150–400)
RBC: 4.7 MIL/uL (ref 3.87–5.11)
RDW: 14.9 % (ref 11.5–15.5)
WBC: 8.2 10*3/uL (ref 4.0–10.5)

## 2011-10-31 LAB — COMPREHENSIVE METABOLIC PANEL
ALT: 10 U/L (ref 0–35)
AST: 11 U/L (ref 0–37)
Albumin: 4.1 g/dL (ref 3.5–5.2)
Alkaline Phosphatase: 79 U/L (ref 39–117)
BUN: 18 mg/dL (ref 6–23)
CO2: 27 mEq/L (ref 19–32)
Calcium: 9.7 mg/dL (ref 8.4–10.5)
Chloride: 105 mEq/L (ref 96–112)
Creat: 1.03 mg/dL (ref 0.50–1.10)
Glucose, Bld: 88 mg/dL (ref 70–99)
Potassium: 4.7 mEq/L (ref 3.5–5.3)
Sodium: 140 mEq/L (ref 135–145)
Total Bilirubin: 0.4 mg/dL (ref 0.3–1.2)
Total Protein: 7.1 g/dL (ref 6.0–8.3)

## 2011-10-31 LAB — LIPID PANEL
Cholesterol: 130 mg/dL (ref 0–200)
HDL: 49 mg/dL (ref >39)
LDL Cholesterol: 72 mg/dL (ref 0–99)
Total CHOL/HDL Ratio: 2.7 Ratio
Triglycerides: 47 mg/dL (ref <150)
VLDL: 9 mg/dL (ref 0–40)

## 2011-10-31 LAB — TSH: TSH: 2.439 u[IU]/mL (ref 0.350–4.500)

## 2011-11-01 LAB — HEMOGLOBIN A1C
Hgb A1c MFr Bld: 6.3 % — ABNORMAL HIGH (ref <5.7)
Mean Plasma Glucose: 134 mg/dL — ABNORMAL HIGH (ref <117)

## 2011-11-06 ENCOUNTER — Encounter: Payer: Self-pay | Admitting: Family Medicine

## 2011-11-06 ENCOUNTER — Ambulatory Visit: Payer: Medicare Other | Admitting: Family Medicine

## 2011-11-10 ENCOUNTER — Encounter: Payer: Self-pay | Admitting: Family Medicine

## 2011-11-10 ENCOUNTER — Ambulatory Visit (INDEPENDENT_AMBULATORY_CARE_PROVIDER_SITE_OTHER): Payer: Medicare Other | Admitting: Family Medicine

## 2011-11-10 VITALS — BP 144/76 | HR 74 | Resp 18 | Ht 65.0 in | Wt 208.0 lb

## 2011-11-10 DIAGNOSIS — Z720 Tobacco use: Secondary | ICD-10-CM

## 2011-11-10 DIAGNOSIS — F17200 Nicotine dependence, unspecified, uncomplicated: Secondary | ICD-10-CM

## 2011-11-10 DIAGNOSIS — I10 Essential (primary) hypertension: Secondary | ICD-10-CM

## 2011-11-10 DIAGNOSIS — R7303 Prediabetes: Secondary | ICD-10-CM

## 2011-11-10 DIAGNOSIS — R7309 Other abnormal glucose: Secondary | ICD-10-CM

## 2011-11-10 MED ORDER — TRAZODONE HCL 50 MG PO TABS: 50.0000 mg | ORAL_TABLET | Freq: Every day | ORAL | Status: DC

## 2011-11-10 NOTE — Progress Notes (Signed)
  Subjective:    Patient ID: Paula Long, female    DOB: [birth year], 61 y.o.   MRN: [id number]  HPI Patient here to followup blood pressure and labs.  She has no concerns today She was seen one week ago for her establishment visit. She still being followed by pain clinic. She tells me that she drinks a 2 L of soda every day.  Review of Systems - per above      Objective:   Physical Exam GEN-NAD, alert and oriented x 3 CVS- Repeat BP 140/80      Assessment & Plan:

## 2011-11-10 NOTE — Assessment & Plan Note (Signed)
We'll continue to monitor her blood pressure will not start medications at this time. Renal function ,liver function of and CBC all normal

## 2011-11-10 NOTE — Assessment & Plan Note (Signed)
She's cut back to one pack per day which is a good success for her. Hopefully she will continue to work on her smoking.

## 2011-11-10 NOTE — Assessment & Plan Note (Signed)
A1c is 6.3% we discussed her diet detail. She is to cut down to 24 ounces of soda a day she was previous to drinking 2 L a day. No meds needed

## 2011-11-10 NOTE — Patient Instructions (Signed)
Decrease the soda to 24 ounces a day  Continue to work on the smoking, congratulations on cutting back to 1 pack a day  Sleeping medication sent in  Please schedule Mammogram  Watch the sweets, junk food, avoid fast food, eat fresh or frozen veggies and fruits  F/U 4 months

## 2011-11-14 ENCOUNTER — Ambulatory Visit (HOSPITAL_COMMUNITY): Payer: Medicare Other

## 2011-11-15 ENCOUNTER — Ambulatory Visit (HOSPITAL_COMMUNITY)
Admission: RE | Admit: 2011-11-15 | Discharge: 2011-11-15 | Disposition: A | Discharge status: Home / Self Care | Payer: Medicare Other | Source: Ambulatory Visit | Attending: Family Medicine | Admitting: Family Medicine

## 2011-11-15 ENCOUNTER — Encounter (HOSPITAL_COMMUNITY): Payer: Medicare Other

## 2011-11-15 DIAGNOSIS — Z09 Encounter for follow-up examination after completed treatment for conditions other than malignant neoplasm: Secondary | ICD-10-CM | POA: Insufficient documentation

## 2011-11-15 DIAGNOSIS — R928 Other abnormal and inconclusive findings on diagnostic imaging of breast: Secondary | ICD-10-CM

## 2011-11-22 ENCOUNTER — Ambulatory Visit (HOSPITAL_COMMUNITY): Payer: Medicare Other

## 2011-12-01 ENCOUNTER — Telehealth: Payer: Self-pay | Admitting: Family Medicine

## 2011-12-01 MED ORDER — TRAZODONE HCL 100 MG PO TABS: 100.0000 mg | ORAL_TABLET | Freq: Every day | ORAL | Status: DC

## 2011-12-01 NOTE — Telephone Encounter (Signed)
Do you want to send in new rx for Trazadone 100mg ?

## 2011-12-01 NOTE — Telephone Encounter (Signed)
New dose sent

## 2011-12-04 ENCOUNTER — Other Ambulatory Visit: Payer: Self-pay | Admitting: Surgical

## 2011-12-05 ENCOUNTER — Encounter (HOSPITAL_COMMUNITY): Payer: Self-pay | Admitting: Pharmacy Technician

## 2011-12-05 NOTE — H&P (Signed)
Paula Long DOB: Feb 23, 1951  Chief Complaint: back pain  History of Present Illness The patient is a 61 year old female who comes in today for a preoperative History and Physical. The patient is scheduled for a decmpressive lumbar laminectomy L5-S1 on the right to be performed by Dr. Georges Lynch. Paula Hillock, MD at [facility] on Monday December 11, 2011 . The patient reports lumbosacral area symptoms including pain, numbness, pain with range of motion, pain with extension, pain with flexion, tingling and left leg numbness and tingling down to her toes which began 2 months ago without any known injury. The patient describes the pain as sharp, burning and tingling. Symptoms are exacerbated by standing and bending. She had a central decompressive lumbar laminectomy. She did fairly well for three years. She had a recent MRI that showed that central decompression that was done is wide open now. She had a small disc herniation on the left at L5-S1. She tried to get by with conservative measures with pain management with Dr. Ethelene Hal but was unable to.    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 06/11/2008 Hives. PERCOCET. 06/11/2008 Hives.   Family History Father. deceased age 20 due to cancer Mother. deceased age 28 due to cancer Cancer. mother, father, grandmother mothers side, grandfather mothers side, grandmother fathers side and grandfather fathers side Chronic Obstructive Lung Disease. sister Hypertension. sister, brother and child   Social History Tobacco use. current every day smoker; smoke(d) 2 1/2 pack(s) per day Tobacco / smoke exposure. yes Marital status. divorced Number of flights of stairs before winded. 2-3 Living situation. live alone Alcohol use. never  consumed alcohol Exercise. Exercises rarely Children. 2 Current work status. disabled Post-Surgical Plans. home with family Advance Directives. none   Medication History Opana (10MG  Tablet, 1 Oral ONE PO Q 6 hrs prn pain, Taken starting 10/24/2011) Active. TraZODone HCl (100MG  Tablet, Oral at bedtime) Active. Advair HFA (115-21MCG/ACT Aerosol, Inhalation as needed) Active.   Past Surgical History Spinal Surgery. central decompressive lumbar laminectomy 2010 Hysterectomy (not due to cancer) - Partial Carpal Tunnel Surgery - Right Finger Surgery. Right. trigger finger Ankle Surgery. left   Review of Systems General:Present- Night Sweats, Fatigue and Weight Gain. Not Present- Chills, Fever, Weight Loss and Memory Loss. Skin:Not Present- Hives, Itching, Rash, Eczema and Lesions. HEENT:Present- Dentures. Not Present- Tinnitus, Headache, Double Vision, Visual Loss and Hearing Loss. Respiratory:Present- Shortness of breath with exertion, Shortness of breath at rest, Chronic Cough and Wheezing. Not Present- Allergies and Coughing up blood. Cardiovascular:Present- Difficulty Breathing Lying Down. Not Present- Chest Pain, Racing/skipping heartbeats, Murmur, Swelling and Palpitations. Gastrointestinal:Not Present- Bloody Stool, Heartburn, Abdominal Pain, Vomiting, Nausea, Constipation, Diarrhea, Difficulty Swallowing, Jaundice and Loss of appetitie. Female Genitourinary:Not Present- Blood in Urine, Urinary frequency, Weak urinary stream, Discharge, Flank Pain, Incontinence, Painful Urination, Urgency, Urinary Retention and Urinating at Night. Musculoskeletal:Present- Muscle Weakness, Back Pain and Morning Stiffness. Not Present- Muscle Pain, Joint Swelling, Joint Pain and Spasms. Neurological:Present- Unsteadiness and Weakness. Not Present- Tremor, Dizziness, Blackout spells, Paralysis and Difficulty with balance. Psychiatric:Not Present- Insomnia.   Vitals Weight:  208 lb Height: 65 in Body Surface Area: 2.08 m Body Mass Index: 34.61 kg/m Pulse: 81 (Regular) Resp.: 18 (Unlabored) BP: 143/89 (Sitting, Left Arm, Standard)    Physical Exam General Mental Status - Alert, cooperative and good historian. General Appearance- pleasant. Not in acute distress. Orientation- Oriented X3.  Build & Nutrition- Overweight, Well nourished and Well developed. Head and Neck Head- normocephalic, atraumatic . Neck Global Assessment- supple. no bruit auscultated on the right and no bruit auscultated on the left. Eye Pupil- Bilateral- Regular and Round. Motion- Bilateral- EOMI. Chest and Lung Exam Auscultation: Breath sounds:Decreased- Note: distant breath sounds Adventitious sounds:- No Adventitious sounds. Cardiovascular Auscultation:Rhythm- Regular rate and rhythm. Heart Sounds- S1 WNL and S2 WNL. Murmurs & Other Heart Sounds:Auscultation of the heart reveals - No Murmurs. Abdomen Palpation/Percussion:Tenderness- Abdomen is non-tender to palpation. Rigidity (guarding)- Abdomen is soft. Auscultation:Auscultation of the abdomen reveals - Bowel sounds normal. Female Genitourinary Not done, not pertinent to present illness Peripheral Vascular Upper Extremity: Palpation:- Pulses bilaterally normal. Lower Extremity: Palpation:- Pulses bilaterally normal. Neurologic Examination of related systems reveals - normal muscle strength and tone in all extremities. Sensory: Light Touch:Decreased- Left L5 distribution. Musculoskeletal Pain with motion of the lumbar spine with pain radiating into the left leg . Positive straight leg raise on the left . Normal painless motion of the hips and knees.   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

## 2011-12-07 ENCOUNTER — Ambulatory Visit (HOSPITAL_COMMUNITY)
Admission: RE | Admit: 2011-12-07 | Discharge: 2011-12-07 | Disposition: A | Discharge status: Home / Self Care | Payer: Medicare Other | Source: Ambulatory Visit | Attending: Surgical | Admitting: Surgical

## 2011-12-07 ENCOUNTER — Encounter (HOSPITAL_COMMUNITY)
Admission: RE | Admit: 2011-12-07 | Discharge: 2011-12-07 | Disposition: A | Discharge status: Home / Self Care | Payer: Medicare Other | Source: Ambulatory Visit | Attending: Orthopedic Surgery | Admitting: Orthopedic Surgery

## 2011-12-07 ENCOUNTER — Encounter (HOSPITAL_COMMUNITY): Payer: Self-pay

## 2011-12-07 DIAGNOSIS — M1990 Unspecified osteoarthritis, unspecified site: Secondary | ICD-10-CM

## 2011-12-07 DIAGNOSIS — J449 Chronic obstructive pulmonary disease, unspecified: Secondary | ICD-10-CM

## 2011-12-07 DIAGNOSIS — Z01812 Encounter for preprocedural laboratory examination: Secondary | ICD-10-CM | POA: Insufficient documentation

## 2011-12-07 DIAGNOSIS — K219 Gastro-esophageal reflux disease without esophagitis: Secondary | ICD-10-CM | POA: Insufficient documentation

## 2011-12-07 DIAGNOSIS — J4489 Other specified chronic obstructive pulmonary disease: Secondary | ICD-10-CM | POA: Insufficient documentation

## 2011-12-07 DIAGNOSIS — I10 Essential (primary) hypertension: Secondary | ICD-10-CM

## 2011-12-07 DIAGNOSIS — M5126 Other intervertebral disc displacement, lumbar region: Secondary | ICD-10-CM | POA: Insufficient documentation

## 2011-12-07 DIAGNOSIS — Z01810 Encounter for preprocedural cardiovascular examination: Secondary | ICD-10-CM | POA: Insufficient documentation

## 2011-12-07 DIAGNOSIS — R0602 Shortness of breath: Secondary | ICD-10-CM

## 2011-12-07 DIAGNOSIS — M48061 Spinal stenosis, lumbar region without neurogenic claudication: Secondary | ICD-10-CM | POA: Insufficient documentation

## 2011-12-07 HISTORY — PX: BACK SURGERY: SHX140

## 2011-12-07 HISTORY — DX: Essential (primary) hypertension: I10

## 2011-12-07 HISTORY — DX: Shortness of breath: R06.02

## 2011-12-07 HISTORY — PX: OTHER SURGICAL HISTORY: SHX169

## 2011-12-07 HISTORY — DX: Unspecified osteoarthritis, unspecified site: M19.90

## 2011-12-07 HISTORY — DX: Chronic obstructive pulmonary disease, unspecified: J44.9

## 2011-12-07 LAB — PROTIME-INR
INR: 0.91 (ref 0.00–1.49)
Prothrombin Time: 12.2 seconds (ref 11.6–15.2)

## 2011-12-07 LAB — COMPREHENSIVE METABOLIC PANEL
ALT: 9 U/L (ref 0–35)
AST: 12 U/L (ref 0–37)
Albumin: 3.8 g/dL (ref 3.5–5.2)
Alkaline Phosphatase: 78 U/L (ref 39–117)
BUN: 16 mg/dL (ref 6–23)
CO2: 28 mEq/L (ref 19–32)
Calcium: 9.7 mg/dL (ref 8.4–10.5)
Chloride: 100 mEq/L (ref 96–112)
Creatinine, Ser: 1.08 mg/dL (ref 0.50–1.10)
GFR calc Af Amer: 64 mL/min — ABNORMAL LOW (ref >90)
GFR calc non Af Amer: 55 mL/min — ABNORMAL LOW (ref >90)
Glucose, Bld: 97 mg/dL (ref 70–99)
Potassium: 4.2 mEq/L (ref 3.5–5.1)
Sodium: 136 mEq/L (ref 135–145)
Total Bilirubin: 0.2 mg/dL — ABNORMAL LOW (ref 0.3–1.2)
Total Protein: 7.5 g/dL (ref 6.0–8.3)

## 2011-12-07 LAB — URINE MICROSCOPIC-ADD ON

## 2011-12-07 LAB — CBC
HCT: 43.5 % (ref 36.0–46.0)
Hemoglobin: 14.4 g/dL (ref 12.0–15.0)
MCH: 30.7 pg (ref 26.0–34.0)
MCHC: 33.1 g/dL (ref 30.0–36.0)
MCV: 92.8 fL (ref 78.0–100.0)
Platelets: 283 10*3/uL (ref 150–400)
RBC: 4.69 MIL/uL (ref 3.87–5.11)
RDW: 14.9 % (ref 11.5–15.5)
WBC: 8.5 10*3/uL (ref 4.0–10.5)

## 2011-12-07 LAB — URINALYSIS, ROUTINE W REFLEX MICROSCOPIC
Bilirubin Urine: NEGATIVE
Glucose, UA: NEGATIVE mg/dL
Ketones, ur: NEGATIVE mg/dL
Nitrite: NEGATIVE
Protein, ur: NEGATIVE mg/dL
Specific Gravity, Urine: 1.012 (ref 1.005–1.030)
Urobilinogen, UA: 0.2 mg/dL (ref 0.0–1.0)
pH: 6 (ref 5.0–8.0)

## 2011-12-07 LAB — SURGICAL PCR SCREEN
MRSA, PCR: NEGATIVE
Staphylococcus aureus: NEGATIVE

## 2011-12-07 LAB — APTT: aPTT: 29 seconds (ref 24–37)

## 2011-12-07 NOTE — Pre-Procedure Instructions (Addendum)
12-07-11 EKG/CXR/Lumbar spine done today. Dr. Leta Jungling given history update- will see preop  Day of surgery.W. Angelina Venard,RN 12-08-11 1520 Urinalysis result faxed to Dr. Jeannetta Ellis office.W. Kennon Portela

## 2011-12-07 NOTE — Patient Instructions (Addendum)
20 KALEE BROXTON  12/07/2011   Your procedure is scheduled on:   12-11-2011  Report to [facility] at     12:00   PM.  Call this number if you have problems the morning of surgery: [phone]  Or Presurgical Testing [phone](Feras Gardella)   Remember:   Do not eat food:After Midnight.  May have clear liquids:up to 6 Hours before arrival. Nothing after : 0800 AM  Clear liquids include soda, tea, black coffee, apple or grape juice, broth.  Take these medicines the morning of surgery with A SIP OF WATER: Nexium, Opano . Use Advair and /bring Albeterol and Advair.   Do not wear jewelry, make-up or nail polish.  Do not wear lotions, powders, or perfumes. You may wear deodorant.  Do not shave 48 hours prior to surgery.(face and neck okay, no shaving of legs)  Do not bring valuables to the hospital.  Contacts, dentures or bridgework may not be worn into surgery.  Leave suitcase in the car. After surgery it may be brought to your room.  For patients admitted to the hospital, checkout time is 11:00 AM the day of discharge.   Patients discharged the day of surgery will not be allowed to drive home. Must have responsible person with you x 24 hours once discharged.  Name and phone number of your driver: Cade Olberding, sister [phone]  Special Instructions: CHG Shower Use Special Wash: 1/2 bottle night before surgery and 1/2 bottle morning of surgery.(avoid face and genitals)-see special instructions.   Please read over the following fact sheets that you were given: MRSA Information, Blood Transfusion fact sheet, Incentive Spirometry Instruction.

## 2011-12-11 ENCOUNTER — Inpatient Hospital Stay (HOSPITAL_COMMUNITY)
Admission: RE | Admit: 2011-12-11 | Discharge: 2011-12-13 | DRG: 491 | Disposition: A | Discharge status: Home / Self Care | Payer: Medicare Other | Source: Ambulatory Visit | Attending: Orthopedic Surgery | Admitting: Orthopedic Surgery

## 2011-12-11 ENCOUNTER — Ambulatory Visit (HOSPITAL_COMMUNITY): Payer: Medicare Other

## 2011-12-11 ENCOUNTER — Encounter (HOSPITAL_COMMUNITY): Payer: Self-pay | Admitting: *Deleted

## 2011-12-11 ENCOUNTER — Encounter (HOSPITAL_COMMUNITY)
Admission: RE | Disposition: A | Discharge status: Home / Self Care | Payer: Self-pay | Source: Ambulatory Visit | Attending: Orthopedic Surgery

## 2011-12-11 ENCOUNTER — Encounter (HOSPITAL_COMMUNITY): Payer: Self-pay | Admitting: Anesthesiology

## 2011-12-11 ENCOUNTER — Ambulatory Visit (HOSPITAL_COMMUNITY): Payer: Medicare Other | Admitting: Anesthesiology

## 2011-12-11 DIAGNOSIS — G988 Other disorders of nervous system: Secondary | ICD-10-CM | POA: Diagnosis present

## 2011-12-11 DIAGNOSIS — I10 Essential (primary) hypertension: Secondary | ICD-10-CM | POA: Diagnosis present

## 2011-12-11 DIAGNOSIS — M216X9 Other acquired deformities of unspecified foot: Secondary | ICD-10-CM | POA: Diagnosis present

## 2011-12-11 DIAGNOSIS — Z79899 Other long term (current) drug therapy: Secondary | ICD-10-CM

## 2011-12-11 DIAGNOSIS — M961 Postlaminectomy syndrome, not elsewhere classified: Secondary | ICD-10-CM | POA: Diagnosis present

## 2011-12-11 DIAGNOSIS — J449 Chronic obstructive pulmonary disease, unspecified: Secondary | ICD-10-CM | POA: Diagnosis present

## 2011-12-11 DIAGNOSIS — F3289 Other specified depressive episodes: Secondary | ICD-10-CM | POA: Diagnosis present

## 2011-12-11 DIAGNOSIS — Z6834 Body mass index (BMI) 34.0-34.9, adult: Secondary | ICD-10-CM

## 2011-12-11 DIAGNOSIS — J4489 Other specified chronic obstructive pulmonary disease: Secondary | ICD-10-CM | POA: Diagnosis present

## 2011-12-11 DIAGNOSIS — K219 Gastro-esophageal reflux disease without esophagitis: Secondary | ICD-10-CM | POA: Diagnosis present

## 2011-12-11 DIAGNOSIS — G8929 Other chronic pain: Secondary | ICD-10-CM | POA: Diagnosis present

## 2011-12-11 DIAGNOSIS — Z90710 Acquired absence of both cervix and uterus: Secondary | ICD-10-CM

## 2011-12-11 DIAGNOSIS — E663 Overweight: Secondary | ICD-10-CM | POA: Diagnosis present

## 2011-12-11 DIAGNOSIS — M48062 Spinal stenosis, lumbar region with neurogenic claudication: Principal | ICD-10-CM | POA: Diagnosis present

## 2011-12-11 DIAGNOSIS — F17200 Nicotine dependence, unspecified, uncomplicated: Secondary | ICD-10-CM | POA: Diagnosis present

## 2011-12-11 DIAGNOSIS — Z9889 Other specified postprocedural states: Secondary | ICD-10-CM

## 2011-12-11 DIAGNOSIS — IMO0002 Reserved for concepts with insufficient information to code with codable children: Secondary | ICD-10-CM | POA: Diagnosis present

## 2011-12-11 DIAGNOSIS — F329 Major depressive disorder, single episode, unspecified: Secondary | ICD-10-CM | POA: Diagnosis present

## 2011-12-11 DIAGNOSIS — Z885 Allergy status to narcotic agent status: Secondary | ICD-10-CM

## 2011-12-11 HISTORY — PX: LUMBAR LAMINECTOMY/DECOMPRESSION MICRODISCECTOMY: SHX5026

## 2011-12-11 LAB — TYPE AND SCREEN
ABO/RH(D): O POS
Antibody Screen: NEGATIVE

## 2011-12-11 LAB — ABO/RH: ABO/RH(D): O POS

## 2011-12-11 SURGERY — LUMBAR LAMINECTOMY/DECOMPRESSION MICRODISCECTOMY
Anesthesia: General | Site: Back | Laterality: Left | Wound class: Clean

## 2011-12-11 MED ORDER — HYDROMORPHONE HCL 2 MG PO TABS
2.0000 mg | ORAL_TABLET | ORAL | Status: DC | PRN
  Administered 2011-12-11 – 2011-12-13 (×7): 2 mg via ORAL
  Filled 2011-12-11 (×7): qty 1

## 2011-12-11 MED ORDER — POLYETHYLENE GLYCOL 3350 17 G PO PACK: 17.0000 g | PACK | Freq: Every day | ORAL | Status: DC | PRN

## 2011-12-11 MED ORDER — ONDANSETRON HCL 4 MG/2ML IJ SOLN: 4.0000 mg | INTRAMUSCULAR | Status: DC | PRN

## 2011-12-11 MED ORDER — DEXAMETHASONE SODIUM PHOSPHATE 10 MG/ML IJ SOLN
INTRAMUSCULAR | Status: DC | PRN
  Administered 2011-12-11: 10 mg via INTRAVENOUS

## 2011-12-11 MED ORDER — BUPIVACAINE LIPOSOME 1.3 % IJ SUSP
20.0000 mL | Freq: Once | INTRAMUSCULAR | Status: AC
  Administered 2011-12-11: 20 mL
  Filled 2011-12-11: qty 20

## 2011-12-11 MED ORDER — SODIUM CHLORIDE 0.9 % IR SOLN
Status: DC | PRN
  Administered 2011-12-11: 15:00:00

## 2011-12-11 MED ORDER — CEFAZOLIN SODIUM-DEXTROSE 2-3 GM-% IV SOLR
2.0000 g | INTRAVENOUS | Status: AC
  Administered 2011-12-11: 2 g via INTRAVENOUS

## 2011-12-11 MED ORDER — ONDANSETRON HCL 4 MG/2ML IJ SOLN
INTRAMUSCULAR | Status: DC | PRN
  Administered 2011-12-11: 4 mg via INTRAVENOUS

## 2011-12-11 MED ORDER — SUCCINYLCHOLINE CHLORIDE 20 MG/ML IJ SOLN
INTRAMUSCULAR | Status: DC | PRN
  Administered 2011-12-11: 100 mg via INTRAVENOUS

## 2011-12-11 MED ORDER — METHOCARBAMOL 500 MG PO TABS
500.0000 mg | ORAL_TABLET | Freq: Four times a day (QID) | ORAL | Status: DC | PRN
  Administered 2011-12-12 – 2011-12-13 (×2): 500 mg via ORAL
  Filled 2011-12-11 (×3): qty 1

## 2011-12-11 MED ORDER — ALBUTEROL SULFATE 0.63 MG/3ML IN NEBU: 1.0000 | INHALATION_SOLUTION | Freq: Four times a day (QID) | RESPIRATORY_TRACT | Status: DC | PRN

## 2011-12-11 MED ORDER — THROMBIN 5000 UNITS EX SOLR
CUTANEOUS | Status: DC | PRN
  Administered 2011-12-11: 5000 [IU] via TOPICAL

## 2011-12-11 MED ORDER — LACTATED RINGERS IV SOLN
INTRAVENOUS | Status: DC
  Administered 2011-12-11: 1000 mL via INTRAVENOUS
  Administered 2011-12-11: 15:00:00 via INTRAVENOUS

## 2011-12-11 MED ORDER — LACTATED RINGERS IV SOLN: INTRAVENOUS | Status: DC

## 2011-12-11 MED ORDER — BUPIVACAINE-EPINEPHRINE 0.25% -1:200000 IJ SOLN
INTRAMUSCULAR | Status: DC | PRN
  Administered 2011-12-11: 30 mL

## 2011-12-11 MED ORDER — HYDROMORPHONE HCL PF 1 MG/ML IJ SOLN
INTRAMUSCULAR | Status: AC
  Filled 2011-12-11: qty 1

## 2011-12-11 MED ORDER — ACETAMINOPHEN 10 MG/ML IV SOLN
INTRAVENOUS | Status: DC | PRN
  Administered 2011-12-11: 1000 mg via INTRAVENOUS

## 2011-12-11 MED ORDER — TRAZODONE HCL 100 MG PO TABS
100.0000 mg | ORAL_TABLET | Freq: Every day | ORAL | Status: DC
  Administered 2011-12-11 – 2011-12-12 (×2): 100 mg via ORAL
  Filled 2011-12-11 (×3): qty 1

## 2011-12-11 MED ORDER — THROMBIN 5000 UNITS EX SOLR
CUTANEOUS | Status: AC
  Filled 2011-12-11: qty 10000

## 2011-12-11 MED ORDER — FLUTICASONE-SALMETEROL 250-50 MCG/DOSE IN AEPB
1.0000 | INHALATION_SPRAY | Freq: Two times a day (BID) | RESPIRATORY_TRACT | Status: DC
  Administered 2011-12-11 – 2011-12-12 (×3): 1 via RESPIRATORY_TRACT
  Filled 2011-12-11: qty 14

## 2011-12-11 MED ORDER — ALBUTEROL SULFATE HFA 108 (90 BASE) MCG/ACT IN AERS
2.0000 | INHALATION_SPRAY | RESPIRATORY_TRACT | Status: DC | PRN
  Filled 2011-12-11: qty 6.7

## 2011-12-11 MED ORDER — CEFAZOLIN SODIUM-DEXTROSE 2-3 GM-% IV SOLR
INTRAVENOUS | Status: AC
  Filled 2011-12-11: qty 50

## 2011-12-11 MED ORDER — BUPIVACAINE-EPINEPHRINE PF 0.25-1:200000 % IJ SOLN
INTRAMUSCULAR | Status: AC
  Filled 2011-12-11: qty 30

## 2011-12-11 MED ORDER — ACETAMINOPHEN 10 MG/ML IV SOLN
INTRAVENOUS | Status: AC
  Filled 2011-12-11: qty 100

## 2011-12-11 MED ORDER — HYDROMORPHONE HCL PF 1 MG/ML IJ SOLN: 0.5000 mg | INTRAMUSCULAR | Status: DC | PRN

## 2011-12-11 MED ORDER — FENTANYL CITRATE 0.05 MG/ML IJ SOLN
INTRAMUSCULAR | Status: DC | PRN
  Administered 2011-12-11 (×3): 50 ug via INTRAVENOUS
  Administered 2011-12-11: 25 ug via INTRAVENOUS
  Administered 2011-12-11: 100 ug via INTRAVENOUS
  Administered 2011-12-11: 50 ug via INTRAVENOUS
  Administered 2011-12-11: 25 ug via INTRAVENOUS
  Administered 2011-12-11 (×2): 50 ug via INTRAVENOUS

## 2011-12-11 MED ORDER — ROCURONIUM BROMIDE 100 MG/10ML IV SOLN
INTRAVENOUS | Status: DC | PRN
  Administered 2011-12-11: 30 mg via INTRAVENOUS
  Administered 2011-12-11: 5 mg via INTRAVENOUS

## 2011-12-11 MED ORDER — ALBUTEROL SULFATE (5 MG/ML) 0.5% IN NEBU: 2.5000 mg | INHALATION_SOLUTION | Freq: Four times a day (QID) | RESPIRATORY_TRACT | Status: DC | PRN

## 2011-12-11 MED ORDER — MENTHOL 3 MG MT LOZG
1.0000 | LOZENGE | OROMUCOSAL | Status: DC | PRN
  Filled 2011-12-11: qty 9

## 2011-12-11 MED ORDER — FLEET ENEMA 7-19 GM/118ML RE ENEM: 1.0000 | ENEMA | Freq: Once | RECTAL | Status: AC | PRN

## 2011-12-11 MED ORDER — HYDROMORPHONE HCL PF 1 MG/ML IJ SOLN
0.2500 mg | INTRAMUSCULAR | Status: DC | PRN
  Administered 2011-12-11 (×4): 0.5 mg via INTRAVENOUS

## 2011-12-11 MED ORDER — LACTATED RINGERS IV SOLN
INTRAVENOUS | Status: DC
  Administered 2011-12-11: 21:00:00 via INTRAVENOUS

## 2011-12-11 MED ORDER — CEFAZOLIN SODIUM 1-5 GM-% IV SOLN
1.0000 g | Freq: Three times a day (TID) | INTRAVENOUS | Status: AC
  Administered 2011-12-11 – 2011-12-12 (×3): 1 g via INTRAVENOUS
  Filled 2011-12-11 (×3): qty 50

## 2011-12-11 MED ORDER — PROPOFOL 10 MG/ML IV BOLUS
INTRAVENOUS | Status: DC | PRN
  Administered 2011-12-11: 150 mg via INTRAVENOUS
  Administered 2011-12-11: 50 mg via INTRAVENOUS

## 2011-12-11 MED ORDER — BACITRACIN-NEOMYCIN-POLYMYXIN 400-5-5000 EX OINT
TOPICAL_OINTMENT | CUTANEOUS | Status: DC | PRN
  Administered 2011-12-11: 1 via TOPICAL

## 2011-12-11 MED ORDER — BISACODYL 10 MG RE SUPP: 10.0000 mg | Freq: Every day | RECTAL | Status: DC | PRN

## 2011-12-11 MED ORDER — PANTOPRAZOLE SODIUM 40 MG PO TBEC
40.0000 mg | DELAYED_RELEASE_TABLET | Freq: Every day | ORAL | Status: DC
  Administered 2011-12-12 – 2011-12-13 (×2): 40 mg via ORAL
  Filled 2011-12-11 (×3): qty 1

## 2011-12-11 MED ORDER — METHOCARBAMOL 100 MG/ML IJ SOLN
500.0000 mg | Freq: Four times a day (QID) | INTRAVENOUS | Status: DC | PRN
  Administered 2011-12-11: 500 mg via INTRAVENOUS
  Filled 2011-12-11: qty 5

## 2011-12-11 MED ORDER — PHENOL 1.4 % MT LIQD
1.0000 | OROMUCOSAL | Status: DC | PRN
  Filled 2011-12-11: qty 177

## 2011-12-11 MED ORDER — NEOSTIGMINE METHYLSULFATE 1 MG/ML IJ SOLN
INTRAMUSCULAR | Status: DC | PRN
  Administered 2011-12-11: 4 mg via INTRAVENOUS

## 2011-12-11 MED ORDER — BACITRACIN ZINC 500 UNIT/GM EX OINT
TOPICAL_OINTMENT | CUTANEOUS | Status: AC
  Filled 2011-12-11: qty 15

## 2011-12-11 MED ORDER — ROCURONIUM BROMIDE 100 MG/10ML IV SOLN: INTRAVENOUS | Status: DC | PRN

## 2011-12-11 MED ORDER — GLYCOPYRROLATE 0.2 MG/ML IJ SOLN
INTRAMUSCULAR | Status: DC | PRN
  Administered 2011-12-11: .5 mg via INTRAVENOUS

## 2011-12-11 SURGICAL SUPPLY — 46 items
APL SKNCLS STERI-STRIP NONHPOA (GAUZE/BANDAGES/DRESSINGS) ×1
BAG SPEC THK2 15X12 ZIP CLS (MISCELLANEOUS) ×1
BAG ZIPLOCK 12X15 (MISCELLANEOUS) ×2 IMPLANT
BENZOIN TINCTURE PRP APPL 2/3 (GAUZE/BANDAGES/DRESSINGS) ×2 IMPLANT
CLEANER TIP ELECTROSURG 2X2 (MISCELLANEOUS) ×2 IMPLANT
CLOTH BEACON ORANGE TIMEOUT ST (SAFETY) ×2 IMPLANT
CONT SPECI 4OZ STER CLIK (MISCELLANEOUS) ×2 IMPLANT
DRAIN PENROSE 18X1/4 LTX STRL (WOUND CARE) IMPLANT
DRAPE LG THREE QUARTER DISP (DRAPES) ×5 IMPLANT
DRAPE MICROSCOPE LEICA (MISCELLANEOUS) ×2 IMPLANT
DRAPE POUCH INSTRU U-SHP 10X18 (DRAPES) ×2 IMPLANT
DRAPE SURG 17X11 SM STRL (DRAPES) ×2 IMPLANT
DRSG ADAPTIC 3X8 NADH LF (GAUZE/BANDAGES/DRESSINGS) ×2 IMPLANT
DRSG PAD ABDOMINAL 8X10 ST (GAUZE/BANDAGES/DRESSINGS) ×5 IMPLANT
DURAPREP 26ML APPLICATOR (WOUND CARE) ×2 IMPLANT
ELECT REM PT RETURN 9FT ADLT (ELECTROSURGICAL) ×2
ELECTRODE REM PT RTRN 9FT ADLT (ELECTROSURGICAL) ×1 IMPLANT
GLOVE BIOGEL PI IND STRL 8 (GLOVE) ×2 IMPLANT
GLOVE BIOGEL PI INDICATOR 8 (GLOVE) ×2
GLOVE ECLIPSE 8.0 STRL XLNG CF (GLOVE) ×4 IMPLANT
GOWN PREVENTION PLUS LG XLONG (DISPOSABLE) ×4 IMPLANT
GOWN STRL REIN XL XLG (GOWN DISPOSABLE) ×4 IMPLANT
KIT BASIN OR (CUSTOM PROCEDURE TRAY) ×2 IMPLANT
KIT POSITIONING SURG ANDREWS (MISCELLANEOUS) ×2 IMPLANT
MANIFOLD NEPTUNE II (INSTRUMENTS) ×2 IMPLANT
NDL SPNL 18GX3.5 QUINCKE PK (NEEDLE) ×2 IMPLANT
NEEDLE SPNL 18GX3.5 QUINCKE PK (NEEDLE) ×4 IMPLANT
NS IRRIG 1000ML POUR BTL (IV SOLUTION) ×1 IMPLANT
PATTIES SURGICAL .5 X.5 (GAUZE/BANDAGES/DRESSINGS) IMPLANT
PATTIES SURGICAL .75X.75 (GAUZE/BANDAGES/DRESSINGS) IMPLANT
PATTIES SURGICAL 1X1 (DISPOSABLE) IMPLANT
PIN SAFETY NICK PLATE  2 MED (MISCELLANEOUS)
PIN SAFETY NICK PLATE 2 MED (MISCELLANEOUS) IMPLANT
POSITIONER SURGICAL ARM (MISCELLANEOUS) ×2 IMPLANT
SPONGE GAUZE 4X4 12PLY (GAUZE/BANDAGES/DRESSINGS) ×1 IMPLANT
SPONGE LAP 4X18 X RAY DECT (DISPOSABLE) ×2 IMPLANT
SPONGE SURGIFOAM ABS GEL 100 (HEMOSTASIS) ×2 IMPLANT
STAPLER VISISTAT 35W (STAPLE) IMPLANT
SUT VIC AB 0 CT1 27 (SUTURE) ×2
SUT VIC AB 0 CT1 27XBRD ANTBC (SUTURE) ×1 IMPLANT
SUT VIC AB 1 CT1 27 (SUTURE) ×8
SUT VIC AB 1 CT1 27XBRD ANTBC (SUTURE) ×4 IMPLANT
TAPE CLOTH SURG 4X10 WHT LF (GAUZE/BANDAGES/DRESSINGS) ×1 IMPLANT
TOWEL OR 17X26 10 PK STRL BLUE (TOWEL DISPOSABLE) ×4 IMPLANT
TRAY LAMINECTOMY (CUSTOM PROCEDURE TRAY) ×2 IMPLANT
WATER STERILE IRR 1500ML POUR (IV SOLUTION) ×1 IMPLANT

## 2011-12-11 NOTE — Preoperative (Signed)
Beta Blockers   Reason not to administer Beta Blockers:Not Applicable 

## 2011-12-11 NOTE — Anesthesia Postprocedure Evaluation (Signed)
  Anesthesia Post-op Note  Patient: Paula Long  Procedure(s) Performed: Procedure(s) (LRB): LUMBAR LAMINECTOMY/DECOMPRESSION MICRODISCECTOMY (Left)  Patient Location: PACU  Anesthesia Type: General  Level of Consciousness: awake and alert   Airway and Oxygen Therapy: Patient Spontanous Breathing  Post-op Pain: mild  Post-op Assessment: Post-op Vital signs reviewed, Patient's Cardiovascular Status Stable, Respiratory Function Stable, Patent Airway and No signs of Nausea or vomiting  Post-op Vital Signs: stable  Complications: No apparent anesthesia complications

## 2011-12-11 NOTE — Transfer of Care (Signed)
Immediate Anesthesia Transfer of Care Note  Patient: Paula Long  Procedure(s) Performed: Procedure(s) (LRB) with comments: LUMBAR LAMINECTOMY/DECOMPRESSION MICRODISCECTOMY (Left) - Decompressive Lumbar Laminectomy L5-S1 on Left  Patient Location: PACU  Anesthesia Type: General  Level of Consciousness: awake and patient cooperative  Airway & Oxygen Therapy: Patient Spontanous Breathing and Patient connected to face mask oxygen  Post-op Assessment: Report given to PACU RN and Post -op Vital signs reviewed and stable  Post vital signs: Reviewed and stable  Complications: No apparent anesthesia complications

## 2011-12-11 NOTE — Interval H&P Note (Signed)
History and Physical Interval Note:  12/11/2011 1:53 PM  Paula Long  has presented today for surgery, with the diagnosis of hnp L5-S1  The various methods of treatment have been discussed with the patient and family. After consideration of risks, benefits and other options for treatment, the patient has consented to  Procedure(s) (LRB) with comments: LUMBAR LAMINECTOMY/DECOMPRESSION MICRODISCECTOMY (Left) - Decompressive Lumbar Laminectomy L5-S1 on Left as a surgical intervention .  The patient's history has been reviewed, patient examined, no change in status, stable for surgery.  I have reviewed the patient's chart and labs.  Questions were answered to the patient's satisfaction.     Ulrick Methot A

## 2011-12-11 NOTE — Brief Op Note (Signed)
12/11/2011  4:40 PM  PATIENT:  Paula Long  61 y.o. 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  POST-OPERATIVE DIAGNOSIS: Same as Pre-OP  PROCEDURE: Complex decompressive Lumbar Laminectomy at L-5_S-1 on the left and L-5 and S-1 roots were Decompressed and Foraminotomies.for both.  SURGEON:  Surgeon(s) and Role:    * Jacki Cones, MD - Primary    * Javier Docker, MD - Assisting    ASSISTANTS: Jene Every MD  ANESTHESIA:   general  EBL:  Total I/O In: 1500 [I.V.:1500] Out: 400 [Urine:300; Blood:100]  BLOOD ADMINISTERED:none  DRAINS: none   LOCAL MEDICATIONS USED:  BUPIVICAINE 20cc mixed with 20cc Normal Saline, A Total of 20cc of this mixture was used.Prior to this I injected 30cc of 0.5% Marcaine with Epinephrine was used prior to making my incision.  SPECIMEN:  No Specimen  DISPOSITION OF SPECIMEN:  N/A  COUNTS:  YES  TOURNIQUET:  * No tourniquets in log *  DICTATION: .Other Dictation: Dictation Number [id number]  PLAN OF CARE: Admit to inpatient   PATIENT DISPOSITION:  PACU - hemodynamically stable.   Delay start of Pharmacological VTE agent (>24hrs) due to surgical blood loss or risk of bleeding: yes

## 2011-12-11 NOTE — Anesthesia Preprocedure Evaluation (Signed)
Anesthesia Evaluation  Patient identified by MRN, date of birth, ID band Patient awake    Reviewed: Allergy & Precautions, H&P , NPO status , Patient's Chart, lab work & pertinent test results  Airway Mallampati: II TM Distance: >3 FB Neck ROM: full    Dental No notable dental hx.    Pulmonary neg pulmonary ROS, shortness of breath and with exertion, COPD COPD inhaler, Current Smoker,  breath sounds clear to auscultation  Pulmonary exam normal       Cardiovascular Exercise Tolerance: Good hypertension, negative cardio ROS  Rhythm:regular Rate:Normal     Neuro/Psych negative neurological ROS  negative psych ROS   GI/Hepatic negative GI ROS, Neg liver ROS, GERD-  Medicated and Controlled,  Endo/Other  negative endocrine ROSPre diabetes  Renal/GU negative Renal ROS  negative genitourinary   Musculoskeletal   Abdominal   Peds  Hematology negative hematology ROS (+)   Anesthesia Other Findings   Reproductive/Obstetrics negative OB ROS                           Anesthesia Physical Anesthesia Plan  ASA: III  Anesthesia Plan: General   Post-op Pain Management:    Induction: Intravenous  Airway Management Planned: Oral ETT  Additional Equipment:   Intra-op Plan:   Post-operative Plan: Extubation in OR  Informed Consent: I have reviewed the patients History and Physical, chart, labs and discussed the procedure including the risks, benefits and alternatives for the proposed anesthesia with the patient or authorized representative who has indicated his/her understanding and acceptance.   Dental Advisory Given  Plan Discussed with: CRNA and Surgeon  Anesthesia Plan Comments:         Anesthesia Quick Evaluation

## 2011-12-12 ENCOUNTER — Encounter (HOSPITAL_COMMUNITY): Payer: Self-pay | Admitting: Orthopedic Surgery

## 2011-12-12 NOTE — Evaluation (Signed)
Occupational Therapy Evaluation Patient Details Name: Paula Long MRN: [id number] DOB: 1950/12/23 Today's Date: 12/12/2011 Time: 2130-8657 OT Time Calculation (min): 16 min  OT Assessment / Plan / Recommendation Clinical Impression  Pt doing well POD 1 LUMBAR LAMINECTOMY/DECOMPRESSION MICRODISCECTOMY (Left). All education completed. Pt will have necessary level of A from family upon d/c.    OT Assessment  Patient does not need any further OT services    Follow Up Recommendations  No OT follow up    Barriers to Discharge      Equipment Recommendations  3 in 1 bedside comode    Recommendations for Other Services    Frequency       Precautions / Restrictions Precautions Precautions: Back Precaution Comments: given handout   Pertinent Vitals/Pain Pt reported 7/10 pain following mobility. Repositioned for comfort.    ADL  Grooming: Performed;Wash/dry hands;Supervision/safety Where Assessed - Grooming: Supported standing Upper Body Bathing: Simulated;Set up Where Assessed - Upper Body Bathing: Unsupported sitting Lower Body Bathing: Simulated;Minimal assistance Where Assessed - Lower Body Bathing: Supported sit to stand Upper Body Dressing: Simulated;Set up Where Assessed - Upper Body Dressing: Unsupported sitting Lower Body Dressing: Simulated;Moderate assistance Where Assessed - Lower Body Dressing: Supported sit to Pharmacist, hospital: Performed;Minimal Dentist Method: Sit to Barista: Regular height toilet;Grab bars Toileting - Clothing Manipulation and Hygiene: Performed;Supervision/safety Where Assessed - Engineer, mining and Hygiene: Sit to stand from 3-in-1 or toilet Equipment Used: Rolling walker ADL Comments: Pt stated she would be spongebathing until able to step into the tub. Declined education in the use of AE stating she would have prn A from family. Pt needed the use of the grab bar to rise from  comfort height toilet.     OT Diagnosis:    OT Problem List:   OT Treatment Interventions:     OT Goals    Visit Information  Last OT Received On: 12/12/11 Assistance Needed: +1    Subjective Data  Subjective: I'm too old to keep getting cut open like this. Patient Stated Goal: Not asked   Prior Functioning  Vision/Perception  Home Living Lives With: Other (Comment) (fiancee) Available Help at Discharge: Family Type of Home: Mobile home Home Access: Stairs to enter Entrance Stairs-Number of Steps: 5 Entrance Stairs-Rails: Can reach both Home Layout: One level Bathroom Shower/Tub: Engineer, manufacturing systems: Standard Home Adaptive Equipment: Straight cane Prior Function Level of Independence: Independent with assistive device(s) Able to Take Stairs?: Yes Driving: Yes Vocation: On disability Communication Communication: No difficulties Dominant Hand: Right      Cognition  Overall Cognitive Status: Appears within functional limits for tasks assessed/performed Arousal/Alertness: Awake/alert Orientation Level: Appears intact for tasks assessed Behavior During Session: [facility] for tasks performed    Extremity/Trunk Assessment Right Upper Extremity Assessment RUE ROM/Strength/Tone: [facility] for tasks assessed Left Upper Extremity Assessment LUE ROM/Strength/Tone: WFL for tasks assessed Right Lower Extremity Assessment RLE ROM/Strength/Tone: [facility] for tasks assessed Left Lower Extremity Assessment LLE ROM/Strength/Tone: Deficits LLE ROM/Strength/Tone Deficits: pt 2+/5 DF as pt can move against gravity but not full range, pt reports L LE weakness prior to surgery, moves WFL for tasks assessed   Mobility  Shoulder Instructions  Bed Mobility Bed Mobility: Rolling Left;Left Sidelying to Sit Rolling Left: 5: Supervision Left Sidelying to Sit: 5: Supervision Details for Bed Mobility Assistance: Reinforced proper technique for log roll. Transfers Transfers: Sit to  Stand;Stand to Sit Sit to Stand: 5: Supervision;4: Min assist;With upper extremity assist;From bed;From toilet  Stand to Sit: 5: Supervision;With upper extremity assist;With armrests;To chair/3-in-1;To toilet Details for Transfer Assistance: supervision needed to stand from bed, min A needed to stand from toilet.       Exercise     Balance     End of Session OT - End of Session Activity Tolerance: Patient tolerated treatment well Patient left: in chair;with call bell/phone within reach  GO     Gizell Danser A OTR/L [phone] 12/12/2011, 11:20 AM

## 2011-12-12 NOTE — Op Note (Signed)
NAMECHANDANI, ROYS               ACCOUNT NO.:  [id number]  MEDICAL RECORD NO.:  [id number]  LOCATION:  1615                         FACILITY:  [facility]  PHYSICIAN:  Georges Lynch. Nylen Creque, M.D.DATE OF BIRTH:  08-Jul-1950  DATE OF PROCEDURE:  12/11/2011 DATE OF DISCHARGE:                              OPERATIVE REPORT   PREOPERATIVE DIAGNOSES: 1. Severe spinal stenosis at L5-S1 on the left. 2. Previous central decompressive lumbar laminectomy at L4-5 on the     left. 3. 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 4. She had a partial footdrop on the left.  POSTOPERATIVE DIAGNOSES: 1. Severe spinal stenosis at L5-S1 on the left. 2. Previous central decompressive lumbar laminectomy at L4-5 on the     left. 3. 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 4. She had a partial footdrop on the left.  OPERATION: 1. THIS IS COMPLEX.  I did a decompressive lumbar laminectomy at L5-S1     on the left. 2. We decompressed TWO nerve roots, the L5 root and the S1 root, and I     would like to state that this appeared to be a conjoined nerve root     situation at L5-S1 on the left. 3. We did foraminotomy for the L5 root and foraminotomy for the S1     root on the left.  SURGEON:  Georges Lynch. Marcianna Daily, MD  ASSISTANT:  Jene Every, MD  PROCEDURE:  Under general anesthesia, a routine orthopedic prepping and draping on the lower back was carried out.  The patient was on the spinal frame.  She had 2 g of IV Ancef.  Prior to surgery, I marked the appropriate left side of her back in the holding area because all her symptoms were on the left.  At this particular time, 2 needles were placed after sterile prepping and draping, and a time-out was carried out.  Two needles were placed in back for localization purposes. Another x-ray was taken.  Following that, an incision was made through the old incision site.  I then went down and separated the muscle from the lamina of  L5, the small portion was remaining and also at S1. Note: THIS WAS SEVERELY COMPLEX because of the previous surgery at L4-5, where there was a central decompressive lumbar laminectomy done 3 years ago. I gradually removed the scar tissue, went down, isolated the sacrum bilaterally and then isolated the L5 lamina.  The microscope table was brought in.  I utilized the large curettes to curette the scar tissue from the lamina.  I then identified the L5-S1 space.  Several x-rays were taken.  At this time, we began our laminectomy at L5, and also down at S1.  We went far out laterally and decompressed the recess, she had severe compression of the dura and roots.  We noted at this time that by use of the microscope, that she had what looked like a definite conjoined nerve root.  We did 1st decompress the L5 root, we went out the foramen with the hockey-stick and we had nice open foramen.  We also went proximally and had a nice  decompression.  Then, we went distally and did a foraminotomy for the S1 root which was quite swollen.  We also went down, did a hemilaminectomy of the sacrum.  I thoroughly exposed the dura.  We examined, we placed 2 markers again that the above and below the disk space, 1 was up the foramen of L5-S1.  We did search for the disk thoroughly and there were  no true herniated disk, it was a hard disk.  The main problem was a severe stenosis.  We thoroughly irrigated out the area.  Good hemostasis was maintained.  I loosely applied some thrombin-soaked Gelfoam.  We both agreed at this time that both roots were thoroughly decompressed on the left.  Note:  Prior to making my incision, I injected 30 mL of 0.25% Marcaine with epinephrine into the soft tissue.  Postop, I injected a mixture of 20 mL.  I injected a mixture of bupivacaine and normal saline.  I used 20 mL of that mixture in the soft tissue, went on the way with closing the wound. The wound then was closed in multiple  layers.  I left a small deep distal and proximal part of the wound open for drainage purposes.  Subcu was closed with #1 Vicryl, and then followed by a 2nd subcu closure with 0 Vicryl.  Skin was closed with metal staples.  A sterile Neosporin dressing was applied.  As I stated, she did have 2 g of Ancef preop.          ______________________________ Georges Lynch. [name], M.D.     RAG/MEDQ  D:  12/11/2011  T:  12/12/2011  Job:  [id number]

## 2011-12-12 NOTE — Evaluation (Signed)
Physical Therapy Evaluation Patient Details Name: Paula Long MRN: [id number] DOB: 09-Apr-1950 Today's Date: 12/12/2011 Time: 4098-1191 PT Time Calculation (min): 17 min  PT Assessment / Plan / Recommendation Clinical Impression  Pt s/p "Complex decompressive Lumbar Laminectomy at L-5_S-1 on the left and L-5 and S-1 roots were Decompressed and Foraminotomies for both." Pt reports L LEwas weaker prior to surgery but now feeling better.  Pt would benefit from 1-2 more treatments of PT to reinforce precautions and safely mobilize pt until d/c home likely tomorrow per pt.    PT Assessment  Patient needs continued PT services    Follow Up Recommendations  No PT follow up    Barriers to Discharge        Equipment Recommendations  3 in 1 bedside comode;Rolling walker with 5" wheels    Recommendations for Other Services     Frequency Min 5X/week    Precautions / Restrictions Precautions Precautions: Back Precaution Comments: given handout   Pertinent Vitals/Pain 2/10 back pain, premedicated, repositioned sidelying in bed with pillow between knees      Mobility  Bed Mobility Bed Mobility: Sit to Sidelying Left Rolling Left: 5: Supervision Left Sidelying to Sit: 5: Supervision Sit to Sidelying Left: 3: Mod assist;HOB flat;With rail Details for Bed Mobility Assistance: educated in log roll technique, pt required assist to bring LEs onto bed, reports family can help with that at home if needed Transfers Transfers: Stand to Sit;Sit to Stand Sit to Stand: 4: Min guard;With upper extremity assist;From toilet;From chair/3-in-1 Stand to Sit: 4: Min guard;With upper extremity assist;To toilet;To bed;To chair/3-in-1 Details for Transfer Assistance: min/guard for safety, pt performed sit <-> stands multiple times for rest break and going to bathroom Ambulation/Gait Ambulation/Gait Assistance: 4: Min guard Ambulation Distance (Feet): 140 Feet (total) Assistive device: Rolling  walker;Straight cane Ambulation/Gait Assistance Details: pt ambulated 80 feet with RW, reports pain decreases with ambulation compared to sitting still, seated rest break, then performed stairs and attempted ambulation for 20 feet with SPC however pt reported pain worse so continued to use RW 40 feet for pain control Gait Pattern: Step-through pattern;Decreased stride length Stairs: Yes Stairs Assistance: 4: Min guard Stairs Assistance Details (indicate cue type and reason): verbal cues for safe technique, up with better R LE and down with L LE (per pt weaker leg) Stair Management Technique: Two rails Number of Stairs: 2     Exercises     PT Diagnosis: Difficulty walking;Acute pain  PT Problem List: Decreased activity tolerance;Decreased mobility;Decreased knowledge of use of DME;Decreased knowledge of precautions;Pain PT Treatment Interventions: DME instruction;Gait training;Stair training;Functional mobility training;Therapeutic exercise;Therapeutic activities;Patient/family education   PT Goals Acute Rehab PT Goals PT Goal Formulation: With patient Time For Goal Achievement: 12/15/11 Potential to Achieve Goals: Good Pt will go Supine/Side to Sit: with supervision PT Goal: Supine/Side to Sit - Progress: Goal set today Pt will go Sit to Supine/Side: with supervision PT Goal: Sit to Supine/Side - Progress: Goal set today Pt will go Sit to Stand: with supervision PT Goal: Sit to Stand - Progress: Goal set today Pt will Ambulate: >150 feet;with supervision;with least restrictive assistive device PT Goal: Ambulate - Progress: Goal set today Pt will Go Up / Down Stairs: 3-5 stairs;with supervision;with rail(s) PT Goal: Up/Down Stairs - Progress: Goal set today  Visit Information  Last PT Received On: 12/12/11 Assistance Needed: +1    Subjective Data  Subjective: I can already tell a difference, my L leg feels better.   Prior Functioning  Home Living Lives With: Other (Comment)  (fiancee) Available Help at Discharge: Family Type of Home: Mobile home Home Access: Stairs to enter Entrance Stairs-Number of Steps: 5 Entrance Stairs-Rails: Can reach both Home Layout: One level Bathroom Shower/Tub: Engineer, manufacturing systems: Standard Home Adaptive Equipment: Straight cane Prior Function Level of Independence: Independent with assistive device(s) Able to Take Stairs?: Yes Driving: Yes Vocation: On disability Communication Communication: No difficulties Dominant Hand: Right    Cognition  Overall Cognitive Status: Appears within functional limits for tasks assessed/performed Arousal/Alertness: Awake/alert Orientation Level: Appears intact for tasks assessed Behavior During Session: [facility] for tasks performed    Extremity/Trunk Assessment Right Upper Extremity Assessment RUE ROM/Strength/Tone: [facility] for tasks assessed Left Upper Extremity Assessment LUE ROM/Strength/Tone: WFL for tasks assessed Right Lower Extremity Assessment RLE ROM/Strength/Tone: [facility] for tasks assessed Left Lower Extremity Assessment LLE ROM/Strength/Tone: Deficits LLE ROM/Strength/Tone Deficits: pt 2+/5 DF as pt can move against gravity but not full range, pt reports L LE weakness prior to surgery, moves WFL for tasks assessed   Balance    End of Session PT - End of Session Equipment Utilized During Treatment: Gait belt Activity Tolerance: Patient tolerated treatment well Patient left: in bed;with call bell/phone within reach  GP     Saivion Goettel,KATHrine E 12/12/2011, 12:16 PM Pager: [phone]

## 2011-12-12 NOTE — Progress Notes (Signed)
Subjective: Doing much better today. No leg pain.   Objective: Vital signs in last 24 hours: Temp:  [96.9 F (36.1 C)-98.3 F (36.8 C)] 98.3 F (36.8 C) (09/24 0558) Pulse Rate:  [60-76] 60  (09/24 0558) Resp:  [15-20] 16  (09/24 0558) BP: (111-172)/(56-76) 122/71 mmHg (09/24 0558) SpO2:  [94 %-99 %] 97 % (09/24 0558) Weight:  [93.895 kg (207 lb)] 93.895 kg (207 lb) (09/23 1745)  Intake/Output from previous day: 09/23 0701 - 09/24 0700 In: 2683.3 [I.V.:2533.3; IV Piggyback:150] Out: 1975 [Urine:1875; Blood:100] Intake/Output this shift:    No results found for this basename: HGB:5 in the last 72 hours No results found for this basename: WBC:2,RBC:2,HCT:2,PLT:2 in the last 72 hours No results found for this basename: NA:2,K:2,CL:2,CO2:2,BUN:2,CREATININE:2,GLUCOSE:2,CALCIUM:2 in the last 72 hours No results found for this basename: LABPT:2,INR:2 in the last 72 hours  Neurovascular intact Dorsiflexion/Plantar flexion intact  Assessment/Plan: Will ambulate and  DC in the morning.    Paula Long 12/12/2011, 7:18 AM

## 2011-12-12 NOTE — Progress Notes (Signed)
Utilization review completed.  

## 2011-12-12 NOTE — Care Management Note (Unsigned)
    Page 1 of 1   12/12/2011     5:59:47 PM   CARE MANAGEMENT NOTE 12/12/2011  Patient:  Paula Long, Paula Long   Account Number:  [id number]  Date Initiated:  12/12/2011  Documentation initiated by:  Colleen Can  Subjective/Objective Assessment:   dx HNP, L5-S1; lumbar laminectomy, decompressive microdissectomy     Action/Plan:   CM spoke with patient. Pt plans to return to her home in [place] where fiance, sister and children will care for her. She needs RW. Does not need 3n1. No HH needed at this time   Anticipated DC Date:  12/13/2011   Anticipated DC Plan:  HOME/SELF CARE  In-house referral  NA      DC Planning Services  CM consult      PAC Choice  NA   Choice offered to / List presented to:  NA   DME arranged  Levan Hurst      DME agency  [facility]        Status of service:  In process, will continue to follow Medicare Important Message given?   (If response is "NO", the following Medicare IM given date fields will be blank) Date Medicare IM given:   Date Additional Medicare IM given:    Discharge Disposition:    Per UR Regulation:    If discussed at Long Length of Stay Meetings, dates discussed:    Comments:

## 2011-12-13 MED ORDER — METHOCARBAMOL 500 MG PO TABS: 500.0000 mg | ORAL_TABLET | Freq: Four times a day (QID) | ORAL | Status: DC | PRN

## 2011-12-13 MED ORDER — OXYMORPHONE HCL 10 MG PO TABS: 10.0000 mg | ORAL_TABLET | Freq: Four times a day (QID) | ORAL | Status: DC | PRN

## 2011-12-13 NOTE — Progress Notes (Signed)
Discharge summary sent to payer through [facility]  

## 2011-12-13 NOTE — Progress Notes (Signed)
   Subjective: 2 Days Post-Op Procedure(s) (LRB): LUMBAR LAMINECTOMY/DECOMPRESSION MICRODISCECTOMY (Left) Patient reports pain as mild.   Patient seen in rounds without Dr. [name]. Patient is well, and has had no acute complaints or problems. She is very pleased with how she is doing. She does have some stiffness and discomfort in the low back from the surgery but is no longer having severe back pain, leg pain, numbness and tingling, or foot drop. She walked with therapy yesterday. She is voiding well. No BM yet but positive flatus.    Objective: Vital signs in last 24 hours: Temp:  [98 F (36.7 C)-98.4 F (36.9 C)] 98.3 F (36.8 C) (09/25 0532) Pulse Rate:  [66-92] 92  (09/25 0532) Resp:  [14-20] 20  (09/25 0532) BP: (99-120)/(66-72) 99/68 mmHg (09/25 0532) SpO2:  [90 %-96 %] 90 % (09/25 0532)  Intake/Output from previous day:  Intake/Output Summary (Last 24 hours) at 12/13/11 0656 Last data filed at 12/13/11 0446  Gross per 24 hour  Intake    580 ml  Output   2100 ml  Net  -1520 ml    Intake/Output this shift: Total I/O In: 220 [P.O.:220] Out: 200 [Urine:200]   EXAM General - Patient is Alert and Oriented Extremity - Neurologically intact Neurovascular intact Dorsiflexion/Plantar flexion intact Dressing/Incision - clean, dry, no drainage Motor Function - intact, moving foot and toes well on exam.   Past Medical History  Diagnosis Date  . GERD (gastroesophageal reflux disease)   . Chronic back pain   . Normal cardiac stress test 06/2009  . Hypertension 12-07-11    presently no meds  . COPD (chronic obstructive pulmonary disease) 12-07-11    pt. uses nebulizer as needed and inhaler daily  . Shortness of breath 12-07-11    less now, occ wheezes  . Arthritis 12-07-11    arthritis,DDD,spinal stenosis    Assessment/Plan: 2 Days Post-Op Procedure(s) (LRB): LUMBAR LAMINECTOMY/DECOMPRESSION MICRODISCECTOMY (Left) Active Problems:  Spinal stenosis, lumbar region,  with neurogenic claudication   Advance diet Up with therapy D/C IV fluids Discharge home today after PT Weight-Bearing as tolerated  Will follow up in office in 2 weeks  Kamron Portee LAUREN 12/13/2011, 6:56 AM

## 2011-12-13 NOTE — Progress Notes (Signed)
Physical Therapy Treatment Patient Details Name: Paula Long MRN: [id number] DOB: November 18, 1950 Today's Date: 12/13/2011 Time: 4098-1191 PT Time Calculation (min): 9 min  PT Assessment / Plan / Recommendation Comments on Treatment Session  Pt ambulated in hallway, also reviewed back precautions.  Pt declined bed mobility and stairs.  RN in to provide pain meds and d/c instructions upon leaving room.    Follow Up Recommendations  No PT follow up    Barriers to Discharge        Equipment Recommendations  3 in 1 bedside comode;Rolling walker with 5" wheels    Recommendations for Other Services    Frequency     Plan Discharge plan remains appropriate    Precautions / Restrictions Precautions Precautions: Back Precaution Comments: reviewed back precautions, pt able to recall 2/3   Pertinent Vitals/Pain 5/10 back pain with ambulation, RN to give meds, repositioned    Mobility  Bed Mobility Details for Bed Mobility Assistance: pt did not wish to practice log roll technique so verbally reeducated, pt reports increased pain with this transfer Transfers Transfers: Stand to Sit;Sit to Stand Sit to Stand: 5: Supervision;From chair/3-in-1;With upper extremity assist Stand to Sit: 5: Supervision;To chair/3-in-1;With upper extremity assist Details for Transfer Assistance: verbal cue for back precautions Ambulation/Gait Ambulation/Gait Assistance: 5: Supervision Ambulation Distance (Feet): 160 Feet Assistive device: Rolling walker Ambulation/Gait Assistance Details: verbal cue for safe use of RW, pt reports 5/10 back pain Gait Pattern: Step-through pattern;Decreased stride length    Exercises     PT Diagnosis:    PT Problem List:   PT Treatment Interventions:     PT Goals Acute Rehab PT Goals PT Goal: Sit to Stand - Progress: Met PT Goal: Ambulate - Progress: Met  Visit Information  Last PT Received On: 12/13/11 Assistance Needed: +1    Subjective Data  Subjective: I'm  ready to get out of here.   Cognition  Overall Cognitive Status: Appears within functional limits for tasks assessed/performed    Balance     End of Session PT - End of Session Activity Tolerance: Patient tolerated treatment well Patient left: in chair;with call bell/phone within reach;with family/visitor present   GP     Hartwell Vandiver,Paula Long 12/13/2011, 11:00 AM Pager: [phone]

## 2011-12-18 NOTE — Discharge Summary (Signed)
Physician Discharge Summary   Patient ID: Paula Long MRN: [id number] DOB/AGE: 06-14-1950 61 y.o.  Admit date: 12/11/2011 Discharge date: 12/13/2011  Primary Diagnosis:  Spinal stenosis, lumbar  Admission Diagnoses:  Past Medical History  Diagnosis Date  . GERD (gastroesophageal reflux disease)   . Chronic back pain   . Normal cardiac stress test 06/2009  . Hypertension 12-07-11    presently no meds  . COPD (chronic obstructive pulmonary disease) 12-07-11    pt. uses nebulizer as needed and inhaler daily  . Shortness of breath 12-07-11    less now, occ wheezes  . Arthritis 12-07-11    arthritis,DDD,spinal stenosis   Discharge Diagnoses:   Active Problems:  Spinal stenosis, lumbar region, with neurogenic claudication S/P lumbar laminectomy/decompression microdiscectomy (left)  Procedure:  Procedure(s) (LRB): LUMBAR LAMINECTOMY/DECOMPRESSION MICRODISCECTOMY (Left)   Consults: None  HPI: The patient is a 61 year old female. The patient reports lumbosacral area symptoms including pain, numbness, pain with range of motion, pain with extension, pain with flexion, tingling and left leg numbness and tingling down to her toes which began 2 months ago without any known injury. The patient describes the pain as sharp, burning and tingling. Symptoms are exacerbated by standing and bending. She had a central decompressive lumbar laminectomy. She did fairly well for three years. She had a recent MRI that showed that central decompression that was done is wide open now. She had a small disc herniation on the left at L5-S1. She tried to get by with conservative measures with pain management with Dr. Ethelene Hal but was unable to.     Laboratory Data: Hospital Outpatient Visit on 12/07/2011  Component Date Value Range Status  . aPTT 12/07/2011 29  24 - 37 seconds Final  . Sodium 12/07/2011 136  135 - 145 mEq/L Final  . Potassium 12/07/2011 4.2  3.5 - 5.1 mEq/L Final  . Chloride 12/07/2011 100   96 - 112 mEq/L Final  . CO2 12/07/2011 28  19 - 32 mEq/L Final  . Glucose, Bld 12/07/2011 97  70 - 99 mg/dL Final  . BUN 30/86/5784 16  6 - 23 mg/dL Final  . Creatinine, Ser 12/07/2011 1.08  0.50 - 1.10 mg/dL Final  . Calcium 69/62/9528 9.7  8.4 - 10.5 mg/dL Final  . Total Protein 12/07/2011 7.5  6.0 - 8.3 g/dL Final  . Albumin 41/32/4401 3.8  3.5 - 5.2 g/dL Final  . AST 02/72/5366 12  0 - 37 U/L Final  . ALT 12/07/2011 9  0 - 35 U/L Final  . Alkaline Phosphatase 12/07/2011 78  39 - 117 U/L Final  . Total Bilirubin 12/07/2011 0.2* 0.3 - 1.2 mg/dL Final  . GFR calc non Af Amer 12/07/2011 55* >90 mL/min Final  . GFR calc Af Amer 12/07/2011 64* >90 mL/min Final   Comment:                                 The eGFR has been calculated                          using the CKD EPI equation.                          This calculation has not been  validated in all clinical                          situations.                          eGFR's persistently                          <90 mL/min signify                          possible Chronic Kidney Disease.  Marland Kitchen Prothrombin Time 12/07/2011 12.2  11.6 - 15.2 seconds Final  . INR 12/07/2011 0.91  0.00 - 1.49 Final  . Color, Urine 12/07/2011 YELLOW  YELLOW Final  . APPearance 12/07/2011 CLEAR  CLEAR Final  . Specific Gravity, Urine 12/07/2011 1.012  1.005 - 1.030 Final  . pH 12/07/2011 6.0  5.0 - 8.0 Final  . Glucose, UA 12/07/2011 NEGATIVE  NEGATIVE mg/dL Final  . Hgb urine dipstick 12/07/2011 SMALL* NEGATIVE Final  . Bilirubin Urine 12/07/2011 NEGATIVE  NEGATIVE Final  . Ketones, ur 12/07/2011 NEGATIVE  NEGATIVE mg/dL Final  . Protein, ur 16/12/9602 NEGATIVE  NEGATIVE mg/dL Final  . Urobilinogen, UA 12/07/2011 0.2  0.0 - 1.0 mg/dL Final  . Nitrite 54/11/8117 NEGATIVE  NEGATIVE Final  . Leukocytes, UA 12/07/2011 MODERATE* NEGATIVE Final  . MRSA, PCR 12/07/2011 NEGATIVE  NEGATIVE Final  . Staphylococcus aureus 12/07/2011  NEGATIVE  NEGATIVE Final   Comment:                                 The Xpert SA Assay (FDA                          approved for NASAL specimens                          in patients over 65 years of age),                          is one component of                          a comprehensive surveillance                          program.  Test performance has                          been validated by [employer] for patients greater                          than or equal to 33 year old.                          It is not intended  to diagnose infection nor to                          guide or monitor treatment.  . WBC 12/07/2011 8.5  4.0 - 10.5 K/uL Final  . RBC 12/07/2011 4.69  3.87 - 5.11 MIL/uL Final  . Hemoglobin 12/07/2011 14.4  12.0 - 15.0 g/dL Final  . HCT 24/40/1027 43.5  36.0 - 46.0 % Final  . MCV 12/07/2011 92.8  78.0 - 100.0 fL Final  . MCH 12/07/2011 30.7  26.0 - 34.0 pg Final  . MCHC 12/07/2011 33.1  30.0 - 36.0 g/dL Final  . RDW 25/36/6440 14.9  11.5 - 15.5 % Final  . Platelets 12/07/2011 283  150 - 400 K/uL Final  . Squamous Epithelial / LPF 12/07/2011 FEW* RARE Final  . WBC, UA 12/07/2011 3-6  <3 WBC/hpf Final  . RBC / HPF 12/07/2011 0-2  <3 RBC/hpf Final  . Bacteria, UA 12/07/2011 RARE  RARE Final  . Urine-Other 12/07/2011 TRICHOMONAS PRESENT   Final    X-Rays:Dg Chest 2 View  12/07/2011  *RADIOLOGY REPORT*  Clinical Data: Preoperative evaluation for back surgery.  CHEST - 2 VIEW  Comparison: 06/27/2009.  Findings: The heart size and mediastinal contours are stable. There is improved aeration of the lung bases with resolution of the previously demonstrated atelectasis.  No edema, confluent airspace opacity or significant pleural effusion is present.  The osseous structures appear normal.  IMPRESSION: Resolved basilar atelectasis.  No active cardiopulmonary process.   Original Report Authenticated By: Gerrianne Scale,  M.D.    X-ray Lumbar Spine Ap And Lateral  12/07/2011  *RADIOLOGY REPORT*  Clinical Data: Preop lumbar surgery.  Stenosis of L5-S1.  LUMBAR SPINE - 2-3 VIEW  Comparison: MRI 08/09/2011  Findings: Five lumbar type vertebral bodies are present.  The vertebral disc space height is preserved. Mild lower lumbar facet arthropathy.  There is anatomic alignment.  Moderate vascular calcification is noted.  IMPRESSION: As above.  AP and lateral views are numbered in accordance with previous MR.   Original Report Authenticated By: Elsie Stain, M.D.    Dg Spine Portable 1 View  12/11/2011  *RADIOLOGY REPORT*  Clinical Data: Surgical level L5-S1.  PORTABLE SPINE - 1 VIEW  Comparison: 12/11/2011  Findings: Posterior surgical instruments at the L5 S1 level.  IMPRESSION: Intraoperative localization as above.   Original Report Authenticated By: Cyndie Chime, M.D.    Dg Spine Portable 1 View  12/11/2011  *RADIOLOGY REPORT*  Clinical Data: Surgical level L5-S1  PORTABLE SPINE - 1 VIEW  Comparison: 12/11/2011  Findings: Posterior surgical instruments overlying the L5-S1 level. Localizing instrument is directed at the L5-S1 disc space.  IMPRESSION: Intraoperative localization as above.   Original Report Authenticated By: Cyndie Chime, M.D.    Dg Spine Portable 1 View  12/11/2011  *RADIOLOGY REPORT*  Clinical Data: Surgical level L5-S1.  PORTABLE SPINE - 1 VIEW  Comparison: 12/07/2011  Findings: Posterior surgical instruments are directed at the L5-S1 level.  IMPRESSION: Intraoperative localization as above.   Original Report Authenticated By: Cyndie Chime, M.D.    Dg Spine Portable 1 View  12/11/2011  *RADIOLOGY REPORT*  Clinical Data: Surgical level of L5-S1.  PORTABLE SPINE - 1 VIEW  Comparison: 12/07/2011  Findings: Posterior needles are noted, directed at the L4-5 and L5- S1 levels.  IMPRESSION: Intraoperative localization as above.   Original Report Authenticated By: Cyndie Chime, M.D.  EKG: Orders  placed during the hospital encounter of 12/11/11  . EKG     Hospital Course:  MYLA MAURIELLO is a 62 y.o. who was admitted to [facility]. They were brought to the operating room on 12/11/2011 and underwent Procedure(s): LUMBAR LAMINECTOMY/DECOMPRESSION MICRODISCECTOMY.  Patient tolerated the procedure well and was later transferred to the recovery room and then to the orthopaedic floor for postoperative care.  They were given PO and IV analgesics for pain control following their surgery.  They were given 24 hours of postoperative antibiotics and started on DVT prophylaxis in the form of SCDs.   PT was ordered. Discharge planning consulted to help with postop disposition and equipment needs.  Patient had a decent night on the evening of surgery and started to get up OOB with therapy on day one.   Continued to work with therapy into day two.  Dressing was changed on day two and the incision was clean and dry. Patient was seen in rounds and was ready to go home.  Discharge Medications: Prior to Admission medications   Medication Sig Start Date End Date Taking? Authorizing Provider  esomeprazole (NEXIUM) 40 MG capsule Take 40 mg by mouth every morning.   Yes Historical Provider, MD  Fluticasone-Salmeterol (ADVAIR DISKUS) 250-50 MCG/DOSE AEPB Inhale 1 puff into the lungs 2 (two) times daily. 10/30/11 10/29/12 Yes Salley Scarlet, MD  traZODone (DESYREL) 100 MG tablet Take 1 tablet (100 mg total) by mouth at bedtime. 12/01/11  Yes Salley Scarlet, MD  albuterol (ACCUNEB) 0.63 MG/3ML nebulizer solution Take 1 ampule by nebulization every 6 (six) hours as needed. For shortness of breath    Historical Provider, MD  albuterol (PROVENTIL HFA;VENTOLIN HFA) 108 (90 BASE) MCG/ACT inhaler Inhale 2 puffs into the lungs every 4 (four) hours as needed for wheezing. 10/30/11 10/29/12  Salley Scarlet, MD  methocarbamol (ROBAXIN) 500 MG tablet Take 1 tablet (500 mg total) by mouth every 6 (six) hours as needed.  12/13/11   Chen Saadeh Tamala Ser, PA  oxymorphone (OPANA) 10 MG tablet Take 1 tablet (10 mg total) by mouth every 6 (six) hours as needed. For pain 12/13/11   Kerby Nora, PA    Diet: Regular diet Activity:WBAT Follow-up:in 2 weeks Disposition - Home Discharged Condition: fair   Discharge Orders    Future Appointments: Provider: Department: Dept Phone: Center:   03/11/2012 10:15 AM Kerri Perches, MD [facility] [phone] [facility]     Future Orders Please Complete By Expires   Diet - low sodium heart healthy      Call MD / Call 911      Comments:   If you experience chest pain or shortness of breath, CALL 911 and be transported to the hospital emergency room.  If you develope a fever above 101 F, pus (white drainage) or increased drainage or redness at the wound, or calf pain, call your surgeon's office.   Constipation Prevention      Comments:   Drink plenty of fluids.  Prune juice may be helpful.  You may use a stool softener, such as Colace (over the counter) 100 mg twice a day.  Use MiraLax (over the counter) for constipation as needed.   Increase activity slowly as tolerated      Discharge instructions      Comments:   Change your dressing daily. Shower only, no tub bath. For first three days at home, cover wound with saran wrap, shower, then replace saran  wrap with new dressing. Call if any temperatures greater than 101 or any wound complications: [phone] during the day and ask for Dr. Jeannetta Ellis nurse, [facility]. Follow up in 2 weeks at the office   Driving restrictions      Comments:   No driving   Lifting restrictions      Comments:   No lifting       Medication List     As of 12/18/2011  7:24 AM    TAKE these medications         albuterol 0.63 MG/3ML nebulizer solution   Commonly known as: ACCUNEB   Take 1 ampule by nebulization every 6 (six) hours as needed. For shortness of breath      albuterol 108 (90 BASE) MCG/ACT inhaler    Commonly known as: PROVENTIL HFA;VENTOLIN HFA   Inhale 2 puffs into the lungs every 4 (four) hours as needed for wheezing.      esomeprazole 40 MG capsule   Commonly known as: NEXIUM   Take 40 mg by mouth every morning.      Fluticasone-Salmeterol 250-50 MCG/DOSE Aepb   Commonly known as: ADVAIR   Inhale 1 puff into the lungs 2 (two) times daily.      methocarbamol 500 MG tablet   Commonly known as: ROBAXIN   Take 1 tablet (500 mg total) by mouth every 6 (six) hours as needed.      oxymorphone 10 MG tablet   Commonly known as: OPANA   Take 1 tablet (10 mg total) by mouth every 6 (six) hours as needed. For pain      traZODone 100 MG tablet   Commonly known as: DESYREL   Take 1 tablet (100 mg total) by mouth at bedtime.         Signed: Amaiah Cristiano LAUREN 12/18/2011, 7:24 AM

## 2012-03-11 ENCOUNTER — Encounter: Payer: Self-pay | Admitting: Family Medicine

## 2012-03-11 ENCOUNTER — Ambulatory Visit (INDEPENDENT_AMBULATORY_CARE_PROVIDER_SITE_OTHER): Payer: Medicare Other | Admitting: Family Medicine

## 2012-03-11 VITALS — BP 144/78 | HR 68 | Resp 16 | Ht 65.0 in | Wt 207.1 lb

## 2012-03-11 DIAGNOSIS — E669 Obesity, unspecified: Secondary | ICD-10-CM

## 2012-03-11 DIAGNOSIS — I10 Essential (primary) hypertension: Secondary | ICD-10-CM

## 2012-03-11 DIAGNOSIS — R350 Frequency of micturition: Secondary | ICD-10-CM

## 2012-03-11 DIAGNOSIS — J449 Chronic obstructive pulmonary disease, unspecified: Secondary | ICD-10-CM

## 2012-03-11 DIAGNOSIS — N63 Unspecified lump in unspecified breast: Secondary | ICD-10-CM

## 2012-03-11 DIAGNOSIS — R928 Other abnormal and inconclusive findings on diagnostic imaging of breast: Secondary | ICD-10-CM

## 2012-03-11 DIAGNOSIS — N390 Urinary tract infection, site not specified: Secondary | ICD-10-CM

## 2012-03-11 DIAGNOSIS — N3281 Overactive bladder: Secondary | ICD-10-CM

## 2012-03-11 DIAGNOSIS — R7309 Other abnormal glucose: Secondary | ICD-10-CM

## 2012-03-11 DIAGNOSIS — N318 Other neuromuscular dysfunction of bladder: Secondary | ICD-10-CM

## 2012-03-11 DIAGNOSIS — R7303 Prediabetes: Secondary | ICD-10-CM

## 2012-03-11 DIAGNOSIS — F17200 Nicotine dependence, unspecified, uncomplicated: Secondary | ICD-10-CM

## 2012-03-11 DIAGNOSIS — Z720 Tobacco use: Secondary | ICD-10-CM

## 2012-03-11 LAB — POCT URINALYSIS DIPSTICK
Bilirubin, UA: NEGATIVE
Glucose, UA: NEGATIVE
Ketones, UA: NEGATIVE
Nitrite, UA: NEGATIVE
Protein, UA: NEGATIVE
Spec Grav, UA: <=1.005
Urobilinogen, UA: 0.2
pH, UA: 5.5

## 2012-03-11 MED ORDER — VARENICLINE TARTRATE 0.5 MG X 11 & 1 MG X 42 PO MISC: ORAL | Status: DC

## 2012-03-11 MED ORDER — OXYBUTYNIN CHLORIDE ER 10 MG PO TB24: 10.0000 mg | ORAL_TABLET | Freq: Every day | ORAL | Status: DC

## 2012-03-11 MED ORDER — TRAZODONE HCL 100 MG PO TABS: 100.0000 mg | ORAL_TABLET | Freq: Every day | ORAL | Status: DC

## 2012-03-11 MED ORDER — ESOMEPRAZOLE MAGNESIUM 40 MG PO CPDR: 40.0000 mg | DELAYED_RELEASE_CAPSULE | Freq: Every morning | ORAL | Status: DC

## 2012-03-11 MED ORDER — AMLODIPINE BESYLATE 5 MG PO TABS: 5.0000 mg | ORAL_TABLET | Freq: Every day | ORAL | Status: DC

## 2012-03-11 NOTE — Patient Instructions (Addendum)
Try the chantix Set a quit date for the smoking Start new bladder medicine New blood pressure pill  Continue mucinex as needed Get labs done today  F/U 3 months

## 2012-03-14 LAB — URINE CULTURE: Colony Count: 15000

## 2012-03-15 ENCOUNTER — Encounter: Payer: Self-pay | Admitting: Family Medicine

## 2012-03-15 DIAGNOSIS — N3281 Overactive bladder: Secondary | ICD-10-CM | POA: Insufficient documentation

## 2012-03-15 NOTE — Assessment & Plan Note (Addendum)
Trial of ditropan UA neg/ UC neg

## 2012-03-15 NOTE — Assessment & Plan Note (Signed)
Recent URI doing well, minimal cough left

## 2012-03-15 NOTE — Progress Notes (Signed)
  Subjective:    Patient ID: Paula Long, female    DOB: Aug 09, 1950, 61 y.o.   MRN: [id number]  HPI Pt here to f/u chronic medical problems, she was evaluated by her insurance nurse and brings in form today She has been having a lot of urinary freqency and urgency, needs something to keep her from going so often, she has had the problem for years, has been trying kegal exercies which have not helped, no incontinence, no leaking at night, occasionally with cough  Would like to try medications to help her quit smoking  S/p back surgery doing well, continues to follow with pain clinic  Review of Systems  GEN- denies fatigue, fever, weight loss,weakness, recent illness HEENT- denies eye drainage, change in vision, nasal discharge, CVS- denies chest pain, palpitations RESP- denies SOB,+ cough, wheeze ABD- denies N/V, change in stools, abd pain GU- + joint pain, muscle aches, injury Neuro- denies headache, dizziness, syncope, seizure activity      Objective:   Physical Exam GEN- NAD, alert and oriented x3 HEENT- PERRL, EOMI, non injected sclera, pink conjunctiva, MMM, oropharynx clear Neck- Supple,  CVS- RRR, no murmur RESP-few scattered wheeze, normal WOB ABD-NABS,SOFT,NT,ND, no CVA tenderness EXT- No edema Pulses- Radial, DP- 2+        Assessment & Plan:

## 2012-03-15 NOTE — Assessment & Plan Note (Signed)
Repeat mammo in March for breast nodule

## 2012-03-15 NOTE — Assessment & Plan Note (Signed)
Trial of chantix 

## 2012-03-15 NOTE — Assessment & Plan Note (Signed)
Start norvasc 5 mg  

## 2012-03-15 NOTE — Assessment & Plan Note (Signed)
Unchanged, she is trying to work on diet

## 2012-06-12 LAB — BASIC METABOLIC PANEL
BUN: 14 mg/dL (ref 6–23)
CO2: 29 mEq/L (ref 19–32)
Calcium: 9.2 mg/dL (ref 8.4–10.5)
Chloride: 104 mEq/L (ref 96–112)
Creat: 1.06 mg/dL (ref 0.50–1.10)
Glucose, Bld: 78 mg/dL (ref 70–99)
Potassium: 4.5 mEq/L (ref 3.5–5.3)
Sodium: 138 mEq/L (ref 135–145)

## 2012-06-12 LAB — HEMOGLOBIN A1C
Hgb A1c MFr Bld: 6 % — ABNORMAL HIGH (ref <5.7)
Mean Plasma Glucose: 126 mg/dL — ABNORMAL HIGH (ref <117)

## 2012-06-13 ENCOUNTER — Ambulatory Visit: Payer: Medicare Other | Admitting: Family Medicine

## 2012-06-21 ENCOUNTER — Encounter: Payer: Self-pay | Admitting: Family Medicine

## 2012-06-21 ENCOUNTER — Ambulatory Visit (INDEPENDENT_AMBULATORY_CARE_PROVIDER_SITE_OTHER): Payer: Medicare Other | Admitting: Family Medicine

## 2012-06-21 VITALS — BP 138/70 | HR 62 | Resp 16 | Wt 207.1 lb

## 2012-06-21 DIAGNOSIS — L609 Nail disorder, unspecified: Secondary | ICD-10-CM

## 2012-06-21 DIAGNOSIS — M549 Dorsalgia, unspecified: Secondary | ICD-10-CM

## 2012-06-21 DIAGNOSIS — Z9889 Other specified postprocedural states: Secondary | ICD-10-CM

## 2012-06-21 DIAGNOSIS — L304 Erythema intertrigo: Secondary | ICD-10-CM

## 2012-06-21 DIAGNOSIS — R928 Other abnormal and inconclusive findings on diagnostic imaging of breast: Secondary | ICD-10-CM

## 2012-06-21 DIAGNOSIS — G8929 Other chronic pain: Secondary | ICD-10-CM

## 2012-06-21 DIAGNOSIS — F17200 Nicotine dependence, unspecified, uncomplicated: Secondary | ICD-10-CM

## 2012-06-21 DIAGNOSIS — M2012 Hallux valgus (acquired), left foot: Secondary | ICD-10-CM

## 2012-06-21 DIAGNOSIS — R079 Chest pain, unspecified: Secondary | ICD-10-CM

## 2012-06-21 DIAGNOSIS — Z720 Tobacco use: Secondary | ICD-10-CM

## 2012-06-21 DIAGNOSIS — R7303 Prediabetes: Secondary | ICD-10-CM

## 2012-06-21 DIAGNOSIS — M542 Cervicalgia: Secondary | ICD-10-CM

## 2012-06-21 DIAGNOSIS — R7309 Other abnormal glucose: Secondary | ICD-10-CM

## 2012-06-21 DIAGNOSIS — M21619 Bunion of unspecified foot: Secondary | ICD-10-CM

## 2012-06-21 DIAGNOSIS — L538 Other specified erythematous conditions: Secondary | ICD-10-CM

## 2012-06-21 DIAGNOSIS — J449 Chronic obstructive pulmonary disease, unspecified: Secondary | ICD-10-CM

## 2012-06-21 DIAGNOSIS — I10 Essential (primary) hypertension: Secondary | ICD-10-CM

## 2012-06-21 MED ORDER — OXYBUTYNIN CHLORIDE ER 10 MG PO TB24: 10.0000 mg | ORAL_TABLET | Freq: Every day | ORAL | Status: DC

## 2012-06-21 MED ORDER — NYSTATIN 100000 UNIT/GM EX POWD: Freq: Four times a day (QID) | CUTANEOUS | Status: DC

## 2012-06-21 MED ORDER — CLOTRIMAZOLE 1 % EX CREA: TOPICAL_CREAM | Freq: Two times a day (BID) | CUTANEOUS | Status: DC

## 2012-06-21 MED ORDER — ESOMEPRAZOLE MAGNESIUM 40 MG PO CPDR: 40.0000 mg | DELAYED_RELEASE_CAPSULE | Freq: Every morning | ORAL | Status: DC

## 2012-06-21 MED ORDER — METHOCARBAMOL 500 MG PO TABS: 500.0000 mg | ORAL_TABLET | Freq: Four times a day (QID) | ORAL | Status: DC | PRN

## 2012-06-21 MED ORDER — ALBUTEROL SULFATE HFA 108 (90 BASE) MCG/ACT IN AERS: 2.0000 | INHALATION_SPRAY | RESPIRATORY_TRACT | Status: DC | PRN

## 2012-06-21 MED ORDER — OXYMORPHONE HCL 10 MG PO TABS: 10.0000 mg | ORAL_TABLET | Freq: Four times a day (QID) | ORAL | Status: DC | PRN

## 2012-06-21 MED ORDER — AMLODIPINE BESYLATE 5 MG PO TABS: 5.0000 mg | ORAL_TABLET | Freq: Every day | ORAL | Status: DC

## 2012-06-21 NOTE — Assessment & Plan Note (Signed)
Referral to podiatry  

## 2012-06-21 NOTE — Progress Notes (Signed)
  Subjective:    Patient ID: Paula Long, female    DOB: 06-06-1950, 62 y.o.   MRN: [id number]  HPI   Patient here to followup chronic medical problems. She has multiple concerns today. She's been having substernal chest pain on and off for the past month she denies nausea vomiting associated occasionally has shortness of breath. He does not improve with her Nexium for her albuterol but she has not been using her Advair. Chest pain does not radiate. Pain occurs with exertion and at rest.  Neck pain for the past one to 2 months she has tightness and stiffness in her neck and occasionally pain goes into her right shoulder does not coincide with her chest pain above.  Rash beneath breasts for the past couple weeks  Chronic pain she is status post multiple back surgeries unfortunately she has an outstanding bill with her surgeon and cannot be seen he was prescribing her narcotic medication and muscle relaxants she's not had any medicines for the past month due to the bill  Spot on foot giving her pain for months, getting worse  Review of Systems  GEN- denies fatigue, fever, weight loss,weakness, recent illness HEENT- denies eye drainage, change in vision, nasal discharge, CVS- denies chest pain, palpitations RESP- denies SOB, cough, wheeze ABD- denies N/V, change in stools, abd pain GU- denies dysuria, hematuria, dribbling, incontinence MSK- + joint pain, muscle aches, injury Neuro- denies headache, dizziness, syncope, seizure activity      Objective:   Physical Exam GEN- NAD, alert and oriented x3 HEENT- PERRL, EOMI, non injected sclera, pink conjunctiva, MMM, oropharynx clear Neck- Supple, stiff ROM, + neck spasm, neg spurlings CVS- RRR, no murmur RESP-few scattered wheeze EXT- No edema, bunion left great toe, with callus formation, thick nails Skin- erythema and moisture beneath bilateral breast Pulses- Radial, DP- 2+  EKG-Sinus bradycardia, no ST changes      Assessment  & Plan:

## 2012-06-21 NOTE — Assessment & Plan Note (Signed)
She will be referred to pain clinic for her chronic back pain. I've given her 60 tablets of Opana

## 2012-06-21 NOTE — Assessment & Plan Note (Signed)
Her A1c has improved from 6.3% to 6%

## 2012-06-21 NOTE — Patient Instructions (Addendum)
Refilled pain medication Get the neck xray Labs look good Referral to heart doctor Referral to pain clinic Restart advair twice a day  Albuterol is your rescue inhaler Cream and powder for your breast Podiatry referral F/U 4 months

## 2012-06-21 NOTE — Assessment & Plan Note (Signed)
Repeat mammogram 

## 2012-06-21 NOTE — Assessment & Plan Note (Signed)
No paresthesia in hands, mild DDD seen on Xray  2011. Repeat  Xray may be more advanced Muscle relaxant given

## 2012-06-21 NOTE — Assessment & Plan Note (Signed)
She had a normal stress test and echo back in 2011. She does have risk factors for coronary artery disease including hypertension obesity and tobacco use. She's also prediabetic. Her EKG is reassuring today I will however send her to cardiology to be evaluated as it has been sometime.

## 2012-06-21 NOTE — Assessment & Plan Note (Signed)
Unchanged continue to work on smoking cessation with her

## 2012-06-21 NOTE — Assessment & Plan Note (Signed)
Antifungal prescribed 

## 2012-06-21 NOTE — Assessment & Plan Note (Signed)
Blood pressure well controlled

## 2012-06-21 NOTE — Assessment & Plan Note (Signed)
She is advised to restart her Advair twice a day this could also be contributing to some of the chest tightness

## 2012-06-27 ENCOUNTER — Encounter: Payer: Self-pay | Admitting: *Deleted

## 2012-06-28 ENCOUNTER — Encounter: Payer: Medicare Other | Admitting: Cardiovascular Disease

## 2012-06-28 NOTE — Patient Instructions (Signed)
Your physician recommends that you schedule a follow-up appointment in:  

## 2012-07-01 NOTE — Progress Notes (Signed)
Patient ID: Paula Long, female   DOB: Nov 11, 1950, 62 y.o.   MRN: [id number] Erroneous Encounter

## 2012-07-08 ENCOUNTER — Encounter: Payer: Self-pay | Admitting: Family Medicine

## 2012-07-29 ENCOUNTER — Ambulatory Visit: Payer: Medicare Other | Admitting: Cardiovascular Disease

## 2012-08-02 ENCOUNTER — Other Ambulatory Visit: Payer: Self-pay | Admitting: Family Medicine

## 2012-08-23 ENCOUNTER — Telehealth: Payer: Self-pay | Admitting: Family Medicine

## 2012-08-23 NOTE — Telephone Encounter (Signed)
Pt wants to know if you can write a note stating she has COPD and SOB to the [facility] in [place]. She says to please fax it to [facility] when its completed.

## 2012-08-26 ENCOUNTER — Encounter: Payer: Self-pay | Admitting: Family Medicine

## 2012-08-26 NOTE — Telephone Encounter (Signed)
Letter has been written with stating her medical problems Please figure out who it needs to be sent to,? Why this says [facility]

## 2012-08-26 NOTE — Telephone Encounter (Signed)
Letter was faxed to [facility] per pt request

## 2012-09-05 ENCOUNTER — Encounter (HOSPITAL_COMMUNITY): Payer: Self-pay

## 2012-09-05 ENCOUNTER — Emergency Department (HOSPITAL_COMMUNITY)
Admission: EM | Admit: 2012-09-05 | Discharge: 2012-09-05 | Disposition: A | Discharge status: Home / Self Care | Payer: Medicare Other | Attending: Emergency Medicine | Admitting: Emergency Medicine

## 2012-09-05 ENCOUNTER — Emergency Department (HOSPITAL_COMMUNITY): Payer: Medicare Other

## 2012-09-05 DIAGNOSIS — K219 Gastro-esophageal reflux disease without esophagitis: Secondary | ICD-10-CM | POA: Insufficient documentation

## 2012-09-05 DIAGNOSIS — J4489 Other specified chronic obstructive pulmonary disease: Secondary | ICD-10-CM | POA: Insufficient documentation

## 2012-09-05 DIAGNOSIS — Z79899 Other long term (current) drug therapy: Secondary | ICD-10-CM | POA: Insufficient documentation

## 2012-09-05 DIAGNOSIS — G8929 Other chronic pain: Secondary | ICD-10-CM | POA: Insufficient documentation

## 2012-09-05 DIAGNOSIS — Z8739 Personal history of other diseases of the musculoskeletal system and connective tissue: Secondary | ICD-10-CM | POA: Insufficient documentation

## 2012-09-05 DIAGNOSIS — F17200 Nicotine dependence, unspecified, uncomplicated: Secondary | ICD-10-CM | POA: Insufficient documentation

## 2012-09-05 DIAGNOSIS — R072 Precordial pain: Secondary | ICD-10-CM | POA: Insufficient documentation

## 2012-09-05 DIAGNOSIS — J449 Chronic obstructive pulmonary disease, unspecified: Secondary | ICD-10-CM | POA: Insufficient documentation

## 2012-09-05 DIAGNOSIS — R079 Chest pain, unspecified: Secondary | ICD-10-CM

## 2012-09-05 DIAGNOSIS — I10 Essential (primary) hypertension: Secondary | ICD-10-CM | POA: Insufficient documentation

## 2012-09-05 LAB — BASIC METABOLIC PANEL
BUN: 16 mg/dL (ref 6–23)
CO2: 27 mEq/L (ref 19–32)
Calcium: 9.5 mg/dL (ref 8.4–10.5)
Chloride: 100 mEq/L (ref 96–112)
Creatinine, Ser: 1.24 mg/dL — ABNORMAL HIGH (ref 0.50–1.10)
GFR calc Af Amer: 54 mL/min — ABNORMAL LOW (ref >90)
GFR calc non Af Amer: 46 mL/min — ABNORMAL LOW (ref >90)
Glucose, Bld: 101 mg/dL — ABNORMAL HIGH (ref 70–99)
Potassium: 4.2 mEq/L (ref 3.5–5.1)
Sodium: 137 mEq/L (ref 135–145)

## 2012-09-05 LAB — CBC WITH DIFFERENTIAL/PLATELET
Basophils Absolute: 0.1 10*3/uL (ref 0.0–0.1)
Basophils Relative: 1 % (ref 0–1)
Eosinophils Absolute: 0.1 10*3/uL (ref 0.0–0.7)
Eosinophils Relative: 1 % (ref 0–5)
HCT: 43 % (ref 36.0–46.0)
Hemoglobin: 14.5 g/dL (ref 12.0–15.0)
Lymphocytes Relative: 34 % (ref 12–46)
Lymphs Abs: 3.1 10*3/uL (ref 0.7–4.0)
MCH: 31 pg (ref 26.0–34.0)
MCHC: 33.7 g/dL (ref 30.0–36.0)
MCV: 92.1 fL (ref 78.0–100.0)
Monocytes Absolute: 0.6 10*3/uL (ref 0.1–1.0)
Monocytes Relative: 7 % (ref 3–12)
Neutro Abs: 5.3 10*3/uL (ref 1.7–7.7)
Neutrophils Relative %: 57 % (ref 43–77)
Platelets: 265 10*3/uL (ref 150–400)
RBC: 4.67 MIL/uL (ref 3.87–5.11)
RDW: 14.9 % (ref 11.5–15.5)
WBC: 9.2 10*3/uL (ref 4.0–10.5)

## 2012-09-05 LAB — TROPONIN I: Troponin I: <0.3 ng/mL (ref <0.30)

## 2012-09-05 MED ORDER — NAPROXEN 500 MG PO TABS: 500.0000 mg | ORAL_TABLET | Freq: Two times a day (BID) | ORAL | Status: DC

## 2012-09-05 MED ORDER — KETOROLAC TROMETHAMINE 60 MG/2ML IM SOLN
60.0000 mg | Freq: Once | INTRAMUSCULAR | Status: AC
  Administered 2012-09-05: 60 mg via INTRAMUSCULAR
  Filled 2012-09-05: qty 2

## 2012-09-05 NOTE — ED Provider Notes (Signed)
History  CSN: [id number]  Arrival date & time 09/05/12  2113   First MD Initiated Contact with Patient 09/05/12 2119      Chief Complaint  Patient presents with  . Chest Pain   HPI HPI Comments:  The history is being family members as well as the patient.  The patient has a history of GERD, COPD and hypertension presents with a complaint of chest pain. She states that over the last 2 weeks she has had fairly persistent chest pain though it is intermittent throughout the day. Over the last 2 days this pain has been constant, has not been relieved at all even with sleeping it is located in the middle of the chest, described as an aching and heavy feeling and not associated with shortness of breath fevers chills nausea vomiting or swelling of the legs. She had a stress test in 2011 which was normal showing no signs of ischemia. She has no history of pulmonary embolism, no recent coughing or fevers and no recent injuries to the chest wall, travel, trauma, immobilization, exogenous estrogen use pain or swelling to the legs. She notes that her chest pain gets worse when she palpates her chest but does not get worse with exertion.   Paula Long is a 62 y.o. female who presents to the Emergency Department complaining of chest pain  Past Medical History  Diagnosis Date  . GERD (gastroesophageal reflux disease)   . Chronic back pain   . Normal cardiac stress test 06/2009  . Hypertension 12-07-11    presently no meds  . COPD (chronic obstructive pulmonary disease) 12-07-11    pt. uses nebulizer as needed and inhaler daily  . Shortness of breath 12-07-11    less now, occ wheezes  . Arthritis 12-07-11    arthritis,DDD,spinal stenosis    Past Surgical History  Procedure Laterality Date  . Abdominal hysterectomy    . Left ankle surgery    . Right wrist surgery for pinched nerve    . Trigger finger surgery  12-07-11    rt. middle trigger finger release  . Back surgery  12-07-11    2'10 L5  .  Lumbar laminectomy/decompression microdiscectomy  12/11/2011    Procedure: LUMBAR LAMINECTOMY/DECOMPRESSION MICRODISCECTOMY;  Surgeon: Jacki Cones, MD;  Location: [facility];  Service: Orthopedics;  Laterality: Left;  Decompressive Lumbar Laminectomy L5-S1 on Left    Family History  Problem Relation Age of Onset  . Cancer Mother     uterine  . Cancer Father     lung    History  Substance Use Topics  . Smoking status: Current Every Day Smoker -- 2.00 packs/day    Types: Cigarettes  . Smokeless tobacco: Not on file  . Alcohol Use: No    OB History   Grav Para Term Preterm Abortions TAB SAB Ect Mult Living                  Review of Systems  All other systems reviewed and are negative.    Allergies  Percocet and Vicodin  Home Medications   Current Outpatient Rx  Name  Route  Sig  Dispense  Refill  . amLODipine (NORVASC) 5 MG tablet   Oral   Take 1 tablet (5 mg total) by mouth daily.   30 tablet   6   . esomeprazole (NEXIUM) 40 MG capsule   Oral   Take 40 mg by mouth daily as needed (for acid reflux/GERD).         Marland Kitchen  oxybutynin (DITROPAN XL) 10 MG 24 hr tablet   Oral   Take 1 tablet (10 mg total) by mouth daily.   30 tablet   6   . traZODone (DESYREL) 100 MG tablet   Oral   Take 100 mg by mouth at bedtime.         Marland Kitchen albuterol (ACCUNEB) 0.63 MG/3ML nebulizer solution   Nebulization   Take 1 ampule by nebulization every 6 (six) hours as needed. For shortness of breath         . albuterol (PROVENTIL HFA;VENTOLIN HFA) 108 (90 BASE) MCG/ACT inhaler   Inhalation   Inhale 2 puffs into the lungs every 4 (four) hours as needed for wheezing.   1 Inhaler   2   . naproxen (NAPROSYN) 500 MG tablet   Oral   Take 1 tablet (500 mg total) by mouth 2 (two) times daily with a meal.   30 tablet   0     BP 155/43  Pulse 66  Temp(Src) 98.8 F (37.1 C) (Oral)  Resp 11  Ht 5\' 5"  (1.651 m)  Wt 200 lb (90.719 kg)  BMI 33.28 kg/m2  SpO2 94%  Physical Exam   Nursing note and vitals reviewed. Constitutional: She appears well-developed and well-nourished. No distress.  HENT:  Head: Normocephalic and atraumatic.  Mouth/Throat: Oropharynx is clear and moist. No oropharyngeal exudate.  Eyes: Conjunctivae and EOM are normal. Pupils are equal, round, and reactive to light. Right eye exhibits no discharge. Left eye exhibits no discharge. No scleral icterus.  Neck: Normal range of motion. Neck supple. No JVD present. No thyromegaly present.  Cardiovascular: Normal rate, regular rhythm, normal heart sounds and intact distal pulses.  Exam reveals no gallop and no friction rub.   No murmur heard. Pulmonary/Chest: Effort normal and breath sounds normal. No respiratory distress. She has no wheezes. She has no rales. She exhibits tenderness ( There is tenderness to palpation over the mid sternal chest, there is no rash in this area, no crepitance, no bruising.).  Abdominal: Soft. Bowel sounds are normal. She exhibits no distension and no mass. There is no tenderness.  Musculoskeletal: Normal range of motion. She exhibits no edema and no tenderness.  Lymphadenopathy:    She has no cervical adenopathy.  Neurological: She is alert. Coordination normal.  Skin: Skin is warm and dry. No rash noted. No erythema.  Psychiatric: She has a normal mood and affect. Her behavior is normal.    ED Course  Procedures (including critical care time)  Labs Reviewed  BASIC METABOLIC PANEL - Abnormal; Notable for the following:    Glucose, Bld 101 (*)    Creatinine, Ser 1.24 (*)    GFR calc non Af Amer 46 (*)    GFR calc Af Amer 54 (*)    All other components within normal limits  TROPONIN I  CBC WITH DIFFERENTIAL   Dg Chest 2 View  09/05/2012   *RADIOLOGY REPORT*  Clinical Data: Shortness of breath  CHEST - 2 VIEW  Comparison: Chest radiograph 12/07/2011  Findings: The heart, mediastinal, hilar contours are within normal limits.  Atherosclerotic calcification of the  thoracic aortic arch. Negative for pleural effusion or pneumothorax.  The bony thorax is unremarkable.  IMPRESSION: No acute bony cardiopulmonary disease.   Original Report Authenticated By: Britta Mccreedy, M.D.     1. Chest pain       MDM  The patient has no swelling of the lower extremities, no tachycardia, a normal EKG and  with pain for the last 2 days that would make it very unlikely that this was an acute coronary syndrome. In addition she has had a recent stress test in 2011 suggesting that she had no coronary disease of any significance at that time. I will start with a chest x-ray and labwork, followup exam, stable at this time.  ED ECG REPORT  I personally interpreted this EKG   Date: 09/05/2012   Rate: 59  Rhythm: normal sinus rhythm  QRS Axis: normal  Intervals: normal  ST/T Wave abnormalities: normal  Conduction Disutrbances:none  Narrative Interpretation:   Old EKG Reviewed: Compared with 12/07/2011, no significant changes   Labs have been benign - pt appears well, nsaids given for pain relief, home with close f/u, pt aware of plan.  Meds given in ED:  Medications  ketorolac (TORADOL) injection 60 mg (not administered)    New Prescriptions   NAPROXEN (NAPROSYN) 500 MG TABLET    Take 1 tablet (500 mg total) by mouth 2 (two) times daily with a meal.         Vida Roller, MD 09/05/12 2251

## 2012-09-05 NOTE — ED Notes (Signed)
Chest pain, mid-sternal for 2 months, worse today per pt. Had 1 nitro en route to the ER. Pain decreased to a 6 per EMS. 20g  IV in left hand by EMS. Denies nausea, vomiting, diarrhea. Complaining of SOB all the time. History of COPD.

## 2012-09-11 ENCOUNTER — Telehealth: Payer: Self-pay | Admitting: Family Medicine

## 2012-09-12 NOTE — Telephone Encounter (Signed)
Spoke to [place] about Paula Long and she was inquiring about a form that needed to be filled out.Marland Kitchen i told her she needed to bring it to [place] as we are her PCP and we wll have it filled out and call when ready

## 2012-09-18 NOTE — Telephone Encounter (Signed)
This was an error

## 2012-10-22 ENCOUNTER — Ambulatory Visit: Payer: Medicare Other | Admitting: Family Medicine

## 2012-10-22 ENCOUNTER — Encounter: Payer: Self-pay | Admitting: Family Medicine

## 2012-10-22 ENCOUNTER — Ambulatory Visit (INDEPENDENT_AMBULATORY_CARE_PROVIDER_SITE_OTHER): Payer: Medicare Other | Admitting: Family Medicine

## 2012-10-22 VITALS — BP 140/70 | HR 70 | Temp 97.2°F | Resp 16 | Wt 205.0 lb

## 2012-10-22 DIAGNOSIS — I10 Essential (primary) hypertension: Secondary | ICD-10-CM

## 2012-10-22 DIAGNOSIS — E669 Obesity, unspecified: Secondary | ICD-10-CM

## 2012-10-22 DIAGNOSIS — L989 Disorder of the skin and subcutaneous tissue, unspecified: Secondary | ICD-10-CM

## 2012-10-22 DIAGNOSIS — N63 Unspecified lump in unspecified breast: Secondary | ICD-10-CM

## 2012-10-22 DIAGNOSIS — N318 Other neuromuscular dysfunction of bladder: Secondary | ICD-10-CM

## 2012-10-22 DIAGNOSIS — N3281 Overactive bladder: Secondary | ICD-10-CM

## 2012-10-22 DIAGNOSIS — J449 Chronic obstructive pulmonary disease, unspecified: Secondary | ICD-10-CM

## 2012-10-22 DIAGNOSIS — K649 Unspecified hemorrhoids: Secondary | ICD-10-CM

## 2012-10-22 DIAGNOSIS — M549 Dorsalgia, unspecified: Secondary | ICD-10-CM

## 2012-10-22 DIAGNOSIS — M48062 Spinal stenosis, lumbar region with neurogenic claudication: Secondary | ICD-10-CM

## 2012-10-22 DIAGNOSIS — G8929 Other chronic pain: Secondary | ICD-10-CM

## 2012-10-22 DIAGNOSIS — R928 Other abnormal and inconclusive findings on diagnostic imaging of breast: Secondary | ICD-10-CM

## 2012-10-22 MED ORDER — ALBUTEROL SULFATE HFA 108 (90 BASE) MCG/ACT IN AERS: 2.0000 | INHALATION_SPRAY | RESPIRATORY_TRACT | Status: DC | PRN

## 2012-10-22 MED ORDER — HYDROCORTISONE 2.5 % RE CREA: TOPICAL_CREAM | Freq: Two times a day (BID) | RECTAL | Status: DC

## 2012-10-22 MED ORDER — METHOCARBAMOL 500 MG PO TABS: 500.0000 mg | ORAL_TABLET | Freq: Three times a day (TID) | ORAL | Status: DC

## 2012-10-22 MED ORDER — AMLODIPINE BESYLATE 10 MG PO TABS: 10.0000 mg | ORAL_TABLET | Freq: Every day | ORAL | Status: DC

## 2012-10-22 MED ORDER — SOLIFENACIN SUCCINATE 5 MG PO TABS: 5.0000 mg | ORAL_TABLET | Freq: Every day | ORAL | Status: DC

## 2012-10-22 MED ORDER — FLUTICASONE-SALMETEROL 250-50 MCG/DOSE IN AEPB: 1.0000 | INHALATION_SPRAY | Freq: Two times a day (BID) | RESPIRATORY_TRACT | Status: DC

## 2012-10-22 MED ORDER — OXYMORPHONE HCL 10 MG PO TABS: 10.0000 mg | ORAL_TABLET | Freq: Four times a day (QID) | ORAL | Status: DC | PRN

## 2012-10-22 NOTE — Assessment & Plan Note (Signed)
Discussed importance of compliance with medications as well as tobacco cessation  She will continue her Advair, she was also given a sample from the office until she can get her prescription

## 2012-10-22 NOTE — Assessment & Plan Note (Signed)
She has some keratoses on her face however the lesion that is growing rapidly needs to be biopsied. I will refer her to dermatology to have this done

## 2012-10-22 NOTE — Assessment & Plan Note (Signed)
S/p surgical intervention now on chronic pain medication. Referral back to pain clinic

## 2012-10-22 NOTE — Assessment & Plan Note (Signed)
Discussed importance of weight loss. 

## 2012-10-22 NOTE — Patient Instructions (Signed)
Continue pain medications REferral to dermatology and pain clinic  New bladder pill Vesicare Use your advair Blood pressure medication increased to 10mg   F/U 3 months

## 2012-10-22 NOTE — Assessment & Plan Note (Signed)
She states current medication does not help will try her on Vesicare

## 2012-10-22 NOTE — Assessment & Plan Note (Signed)
Uncontrolled, very elevated in ER, increase norvasc to 10mg 

## 2012-10-22 NOTE — Assessment & Plan Note (Signed)
Refilled Opana, she will be referred to pain clinic again, discussed importance of follow- through with medications

## 2012-10-22 NOTE — Progress Notes (Signed)
  Subjective:    Patient ID: Paula Long, female    DOB: 1950-06-29, 62 y.o.   MRN: [id number]  HPI  Patient here follow chronic medical problems. Unfortunately due to some finances and transportation she did not follow through with any other referrals are placed at the last visit 3 months ago. She has not seen podiatry for her foot pain as well as bunion, she did not see the pain clinic for her chronic back pain and medications. She does not follow with cardiology and was actually seen in the emergency room in June secondary to an episode of chest pain which he ruled out negative. Today she continues to have issues with her feet as well as her lower back. Of note she did have a nurse from her insurance company come out to the house and they actually gave her prescription for her Opana last month. She is worried about a spot on her face that has been growing over the past 6 months and will like to have this removed. COPD-she'll use her Advair as needed she continues to smoke.  Note upon leaving visit she asked for cream she could use for her hemorrhoids she's been using over-the-counter Tucks with minimal relief. She has no current bleeding  Review of Systems   GEN- denies fatigue, fever, weight loss,weakness, recent illness HEENT- denies eye drainage, change in vision, nasal discharge, CVS- denies chest pain, palpitations RESP- denies SOB, cough, wheeze ABD- denies N/V, change in stools, abd pain GU- denies dysuria, hematuria, dribbling, incontinence MSK- denies joint pain, muscle aches, injury Neuro- denies headache, dizziness, syncope, seizure activity      Objective:   Physical Exam GEN- NAD, alert and oriented x3 HEENT- PERRL, EOMI, non injected sclera, pink conjunctiva, MMM, oropharynx clear Neck- Supple, CVS- RRR, no murmur RESP-CTAB, no wheeze, normal WOB EXT- No edema,  Skin- Left cheek - yellowish keratotic lesion, mild TTP, no erythema no draiange, seborrhoic keratosis  anterior left ear and right cheeck Pulses- Radial 2+       Assessment & Plan:

## 2012-10-22 NOTE — Assessment & Plan Note (Signed)
Hydrocortisone suppository

## 2012-11-05 ENCOUNTER — Telehealth: Payer: Self-pay | Admitting: Family Medicine

## 2012-11-05 MED ORDER — TRAZODONE HCL 100 MG PO TABS: 100.0000 mg | ORAL_TABLET | Freq: Every day | ORAL | Status: DC

## 2012-11-05 NOTE — Telephone Encounter (Signed)
Okay to refill? 

## 2012-11-05 NOTE — Telephone Encounter (Signed)
Rx Refilled  

## 2012-11-05 NOTE — Telephone Encounter (Signed)
?   OK to Refill  

## 2012-11-05 NOTE — Telephone Encounter (Signed)
Trazodone 100 mg tab 1 QHS #30

## 2012-11-06 ENCOUNTER — Ambulatory Visit (HOSPITAL_COMMUNITY)
Admission: RE | Admit: 2012-11-06 | Discharge: 2012-11-06 | Disposition: A | Discharge status: Home / Self Care | Payer: Medicare Other | Source: Ambulatory Visit | Attending: Family Medicine | Admitting: Family Medicine

## 2012-11-06 DIAGNOSIS — R928 Other abnormal and inconclusive findings on diagnostic imaging of breast: Secondary | ICD-10-CM

## 2012-11-06 DIAGNOSIS — Z1231 Encounter for screening mammogram for malignant neoplasm of breast: Secondary | ICD-10-CM | POA: Insufficient documentation

## 2012-11-06 DIAGNOSIS — N63 Unspecified lump in unspecified breast: Secondary | ICD-10-CM

## 2012-11-13 ENCOUNTER — Telehealth: Payer: Self-pay | Admitting: Family Medicine

## 2012-11-14 MED ORDER — OXYMORPHONE HCL 10 MG PO TABS: 10.0000 mg | ORAL_TABLET | Freq: Four times a day (QID) | ORAL | Status: DC | PRN

## 2012-11-14 NOTE — Telephone Encounter (Signed)
Okay to refill? 

## 2012-11-14 NOTE — Telephone Encounter (Signed)
Meds refilled.

## 2012-11-14 NOTE — Telephone Encounter (Signed)
Ok to refill 

## 2012-11-29 ENCOUNTER — Telehealth: Payer: Self-pay | Admitting: Family Medicine

## 2012-11-29 NOTE — Telephone Encounter (Signed)
Ok to refill 

## 2012-11-29 NOTE — Telephone Encounter (Signed)
Opana 10 mg 1 q6 hours prn pain

## 2012-11-29 NOTE — Telephone Encounter (Signed)
Called pt back and her appt for pain clinic is on Oct 12 and was wanting tjo know if she coulld get some pain meds for tomorrow when she goes to the foot doctor.I told her it was too early to receive Opana, but to take ibuprofen/tjylenol to help her with pain.

## 2012-11-29 NOTE — Telephone Encounter (Signed)
Please see when her pain clinic appointment is? If she missed her appointment I will not continue to prescribe the Opana Also Pt received refill on 8/28 and she can not get until 9/27.

## 2012-12-05 ENCOUNTER — Telehealth: Payer: Self-pay | Admitting: Family Medicine

## 2012-12-05 MED ORDER — TRAZODONE HCL 100 MG PO TABS: 100.0000 mg | ORAL_TABLET | Freq: Every day | ORAL | Status: DC

## 2012-12-05 NOTE — Addendum Note (Signed)
Addended by: Elvina Mattes T on: 12/05/2012 04:37 PM   Modules accepted: Orders

## 2012-12-05 NOTE — Telephone Encounter (Signed)
Trazodone 100 mg tab 1 QHS #30 °

## 2012-12-05 NOTE — Telephone Encounter (Signed)
Ok to refill 

## 2012-12-05 NOTE — Telephone Encounter (Signed)
Med phoned in °

## 2012-12-05 NOTE — Telephone Encounter (Signed)
yes

## 2012-12-09 ENCOUNTER — Telehealth: Payer: Self-pay | Admitting: Family Medicine

## 2012-12-09 DIAGNOSIS — M5136 Other intervertebral disc degeneration, lumbar region: Secondary | ICD-10-CM

## 2012-12-09 DIAGNOSIS — Z9889 Other specified postprocedural states: Secondary | ICD-10-CM

## 2012-12-09 DIAGNOSIS — M48062 Spinal stenosis, lumbar region with neurogenic claudication: Secondary | ICD-10-CM

## 2012-12-09 NOTE — Telephone Encounter (Signed)
Pt wants a referral to go see someone about her back. She said that she used to see someone about it, but you know the situation and she wants a referral somewhere else.

## 2012-12-09 NOTE — Telephone Encounter (Signed)
Please call and see why she needs a referral to a new neurosurgeon, no one else will see her at this time as it has been less than 1 year from her last surgery and she needs to go back to the same office Please make sure she is going to pain management as well.   Get last OV notes from [facility] - Dr. Ciro Backer

## 2012-12-11 ENCOUNTER — Telehealth: Payer: Self-pay | Admitting: Family Medicine

## 2012-12-11 MED ORDER — OXYMORPHONE HCL 10 MG PO TABS: 10.0000 mg | ORAL_TABLET | Freq: Four times a day (QID) | ORAL | Status: DC | PRN

## 2012-12-11 NOTE — Telephone Encounter (Signed)
LMTRC

## 2012-12-11 NOTE — Telephone Encounter (Signed)
Pt called back adn told her she was not due for refill until 12/15/12 but she wanted to know if you can write out prescription early and just put the date to refill on it since she was going to be in town tomorrow.

## 2012-12-11 NOTE — Telephone Encounter (Signed)
She can not refill until 9/28, I have signed script

## 2012-12-11 NOTE — Telephone Encounter (Signed)
Opana 10 mg 1 q6 hours prn pain (pt is coming into town tomorrow so she wants to see if she can pick it up then)

## 2012-12-12 NOTE — Telephone Encounter (Signed)
Pt called back stating that she needs new neurosurgeoun she is still huring in her back and it has been more than a year ago. She states she can not go back to Dr. Ciro Backer because she has a balance of 260.00+ and she refuses to pay that amount. She states that you knew the situation.

## 2012-12-13 NOTE — Telephone Encounter (Signed)
Please let pt know we will send a new referral to neurosurgery - I can not guarantee any one will pick her up as a new patient because she had surgery done in Sept 2013. We will let her know

## 2012-12-17 NOTE — Telephone Encounter (Signed)
Pt aware.

## 2013-01-06 ENCOUNTER — Other Ambulatory Visit: Payer: Self-pay | Admitting: Family Medicine

## 2013-01-06 NOTE — Telephone Encounter (Signed)
Okay to refill give 3 refills 

## 2013-01-06 NOTE — Telephone Encounter (Signed)
?   Ok to refill, last refill 12/05/12

## 2013-01-14 ENCOUNTER — Telehealth: Payer: Self-pay | Admitting: Family Medicine

## 2013-01-14 NOTE — Telephone Encounter (Signed)
Patient needs her opana refilled .   [facility]  [phone]

## 2013-01-14 NOTE — Telephone Encounter (Signed)
Ok to refill 

## 2013-01-14 NOTE — Telephone Encounter (Signed)
Please see why she is not seeing the pain management doctor, we have set her up with this twice. I will not continue to fill the opana this needs to be done by pain management.  See if she missed appt or what happended

## 2013-01-15 MED ORDER — OXYMORPHONE HCL 10 MG PO TABS: 10.0000 mg | ORAL_TABLET | Freq: Four times a day (QID) | ORAL | Status: DC | PRN

## 2013-01-15 NOTE — Telephone Encounter (Signed)
rx prionted and pt aware that Dr.Evant is not going to refill anymore, needs to get form pain management doctor.

## 2013-02-05 ENCOUNTER — Ambulatory Visit: Payer: Medicare Other | Admitting: Family Medicine

## 2013-02-14 ENCOUNTER — Telehealth: Payer: Self-pay | Admitting: Family Medicine

## 2013-02-14 MED ORDER — AMLODIPINE BESYLATE 10 MG PO TABS: 10.0000 mg | ORAL_TABLET | Freq: Every day | ORAL | Status: DC

## 2013-02-14 NOTE — Telephone Encounter (Signed)
Medication refilled per protocol. 

## 2013-02-26 ENCOUNTER — Ambulatory Visit (INDEPENDENT_AMBULATORY_CARE_PROVIDER_SITE_OTHER): Payer: Medicare Other | Admitting: Family Medicine

## 2013-02-26 VITALS — BP 128/70 | HR 68 | Temp 98.2°F | Resp 18 | Ht 62.0 in | Wt 202.0 lb

## 2013-02-26 DIAGNOSIS — N318 Other neuromuscular dysfunction of bladder: Secondary | ICD-10-CM

## 2013-02-26 DIAGNOSIS — I10 Essential (primary) hypertension: Secondary | ICD-10-CM

## 2013-02-26 DIAGNOSIS — M549 Dorsalgia, unspecified: Secondary | ICD-10-CM

## 2013-02-26 DIAGNOSIS — G8929 Other chronic pain: Secondary | ICD-10-CM

## 2013-02-26 DIAGNOSIS — Z23 Encounter for immunization: Secondary | ICD-10-CM

## 2013-02-26 DIAGNOSIS — R7303 Prediabetes: Secondary | ICD-10-CM

## 2013-02-26 DIAGNOSIS — R7309 Other abnormal glucose: Secondary | ICD-10-CM

## 2013-02-26 DIAGNOSIS — L0292 Furuncle, unspecified: Secondary | ICD-10-CM

## 2013-02-26 DIAGNOSIS — N3281 Overactive bladder: Secondary | ICD-10-CM

## 2013-02-26 DIAGNOSIS — J449 Chronic obstructive pulmonary disease, unspecified: Secondary | ICD-10-CM

## 2013-02-26 LAB — CBC WITH DIFFERENTIAL/PLATELET
Basophils Absolute: 0.1 10*3/uL (ref 0.0–0.1)
Basophils Relative: 1 % (ref 0–1)
Eosinophils Absolute: 0.1 10*3/uL (ref 0.0–0.7)
Eosinophils Relative: 1 % (ref 0–5)
HCT: 43.4 % (ref 36.0–46.0)
Hemoglobin: 15 g/dL (ref 12.0–15.0)
Lymphocytes Relative: 27 % (ref 12–46)
Lymphs Abs: 2.4 10*3/uL (ref 0.7–4.0)
MCH: 31 pg (ref 26.0–34.0)
MCHC: 34.6 g/dL (ref 30.0–36.0)
MCV: 89.7 fL (ref 78.0–100.0)
Monocytes Absolute: 0.7 10*3/uL (ref 0.1–1.0)
Monocytes Relative: 8 % (ref 3–12)
Neutro Abs: 5.7 10*3/uL (ref 1.7–7.7)
Neutrophils Relative %: 63 % (ref 43–77)
Platelets: 330 10*3/uL (ref 150–400)
RBC: 4.84 MIL/uL (ref 3.87–5.11)
RDW: 14.7 % (ref 11.5–15.5)
WBC: 8.8 10*3/uL (ref 4.0–10.5)

## 2013-02-26 MED ORDER — HYDROCORTISONE 2.5 % RE CREA: TOPICAL_CREAM | Freq: Two times a day (BID) | RECTAL | Status: DC

## 2013-02-26 MED ORDER — ALBUTEROL SULFATE HFA 108 (90 BASE) MCG/ACT IN AERS: 2.0000 | INHALATION_SPRAY | RESPIRATORY_TRACT | Status: DC | PRN

## 2013-02-26 MED ORDER — OXYMORPHONE HCL 10 MG PO TABS: 10.0000 mg | ORAL_TABLET | Freq: Four times a day (QID) | ORAL | Status: DC | PRN

## 2013-02-26 MED ORDER — SOLIFENACIN SUCCINATE 10 MG PO TABS: 10.0000 mg | ORAL_TABLET | Freq: Every day | ORAL | Status: DC

## 2013-02-26 MED ORDER — FLUTICASONE-SALMETEROL 250-50 MCG/DOSE IN AEPB: 1.0000 | INHALATION_SPRAY | Freq: Two times a day (BID) | RESPIRATORY_TRACT | Status: DC

## 2013-02-26 MED ORDER — SULFAMETHOXAZOLE-TMP DS 800-160 MG PO TABS: 1.0000 | ORAL_TABLET | Freq: Two times a day (BID) | ORAL | Status: DC

## 2013-02-26 NOTE — Patient Instructions (Signed)
Referral back to pain doctor We will send letter with lab results Vesicare increased to 10mg  Bactrim for boil Pneumovax given F/U 4 months

## 2013-02-27 ENCOUNTER — Encounter: Payer: Self-pay | Admitting: *Deleted

## 2013-02-27 ENCOUNTER — Encounter: Payer: Self-pay | Admitting: Family Medicine

## 2013-02-27 DIAGNOSIS — L0292 Furuncle, unspecified: Secondary | ICD-10-CM | POA: Insufficient documentation

## 2013-02-27 LAB — COMPREHENSIVE METABOLIC PANEL
ALT: 10 U/L (ref 0–35)
AST: 13 U/L (ref 0–37)
Albumin: 4.3 g/dL (ref 3.5–5.2)
Alkaline Phosphatase: 87 U/L (ref 39–117)
BUN: 13 mg/dL (ref 6–23)
CO2: 28 mEq/L (ref 19–32)
Calcium: 10.3 mg/dL (ref 8.4–10.5)
Chloride: 100 mEq/L (ref 96–112)
Creat: 0.93 mg/dL (ref 0.50–1.10)
Glucose, Bld: 84 mg/dL (ref 70–99)
Potassium: 4.6 mEq/L (ref 3.5–5.3)
Sodium: 137 mEq/L (ref 135–145)
Total Bilirubin: 0.3 mg/dL (ref 0.3–1.2)
Total Protein: 7.7 g/dL (ref 6.0–8.3)

## 2013-02-27 LAB — HEMOGLOBIN A1C
Hgb A1c MFr Bld: 6 % — ABNORMAL HIGH (ref <5.7)
Mean Plasma Glucose: 126 mg/dL — ABNORMAL HIGH (ref <117)

## 2013-02-27 NOTE — Assessment & Plan Note (Signed)
Blood pressure improved on Norvasc 10 mg

## 2013-02-27 NOTE — Assessment & Plan Note (Signed)
Vesicare increased to 10 mg daily

## 2013-02-27 NOTE — Assessment & Plan Note (Addendum)
Advise her that we will try to reschedule one more time with the pain clinic they may not take her as she has missed her appointment before. I will give her one more prescription of the opana She has rescheduled her appointment with the neurosurgeon for her second opinion. I discussed with her the importance of keeping her appointments with her specialist

## 2013-02-27 NOTE — Progress Notes (Signed)
   Subjective:    Patient ID: Paula Long, female    DOB: 1951-01-01, 62 y.o.   MRN: [id number]  HPI Patient here to follow chronic medical problems. She's missed her appointments for her dermatologist for followup on her basal cell cancer she needs the Mohs procedure per report. She also missed her appointment to the pain Dr. states that she was moving during this time. She also missed her appointment with the neurosurgeon for her second opinion regarding her back pain.  She is a boil that is impressive for the past few days it is not draining.  COPD-he is out of her Advair she continues to have cough on and off but has not had any wheezing.  Hypertension-last visit her Norvasc was increased to 10 mg she's not had any difficulty with this medication no leg swelling.  Overactive bladder-she will like her bladder pill increased as it is not helping control her symptoms. She has to run to the bathroom if not she will have some incontinence.   Review of Systems  GEN- denies fatigue, fever, weight loss,weakness, recent illness HEENT- denies eye drainage, change in vision, nasal discharge, CVS- denies chest pain, palpitations RESP- denies SOB, cough, wheeze ABD- denies N/V, change in stools, abd pain GU- denies dysuria, hematuria, dribbling, +incontinence MSK- + joint pain, muscle aches, injury Neuro- denies headache, dizziness, syncope, seizure activity      Objective:   Physical Exam  GEN- NAD, alert and oriented x3 HEENT- PERRL, EOMI, non injected sclera, pink conjunctiva, MMM, oropharynx clear Neck- Supple, CVS- RRR, no murmur RESP-few scattered wheeze, otherwise clear EXT- No edema,  Skin- 1cm boil Right mons pubis TTP, mild fluctuance, + erythema surrounding Pulses- Radial 2+        Assessment & Plan:

## 2013-02-27 NOTE — Assessment & Plan Note (Signed)
Check A1c and metabolic panel discussed importance of weight loss

## 2013-02-27 NOTE — Assessment & Plan Note (Signed)
Bactrim twice a day warm compresses to area

## 2013-02-27 NOTE — Assessment & Plan Note (Signed)
Continue the Advair and albuterol. She was given her pneumonia vaccine she declines flu shot

## 2013-03-18 ENCOUNTER — Telehealth: Payer: Self-pay | Admitting: Family Medicine

## 2013-03-18 ENCOUNTER — Other Ambulatory Visit: Payer: Self-pay | Admitting: Family Medicine

## 2013-03-18 NOTE — Telephone Encounter (Signed)
Medication refilled per protocol. 

## 2013-03-18 NOTE — Telephone Encounter (Signed)
Med already refilled

## 2013-04-01 ENCOUNTER — Telehealth: Payer: Self-pay | Admitting: *Deleted

## 2013-04-01 DIAGNOSIS — M549 Dorsalgia, unspecified: Secondary | ICD-10-CM

## 2013-04-01 DIAGNOSIS — G8929 Other chronic pain: Secondary | ICD-10-CM

## 2013-04-01 DIAGNOSIS — M48062 Spinal stenosis, lumbar region with neurogenic claudication: Secondary | ICD-10-CM

## 2013-04-01 NOTE — Telephone Encounter (Signed)
Please call and see when pain clinic appt is

## 2013-04-01 NOTE — Telephone Encounter (Signed)
Pt called back and her appt is already set up for March 2 pt is also stating that even though appt has been set up still needs a referral due to the one taht was put in has expired.

## 2013-04-01 NOTE — Telephone Encounter (Signed)
Call Neurology to verify appt and see if new referral is needed I need this info before I will prescribe her any more narcotics

## 2013-04-01 NOTE — Telephone Encounter (Signed)
Pt ask that we write in generic form please.

## 2013-04-03 NOTE — Telephone Encounter (Signed)
Called Dr. Alessandra Grout office and they do require a new referral for pain mgmt since pt had cancelled her appt in Oct, they is a intake class that pt has to do before a set appt and that is scheduled for Mar 13.

## 2013-04-04 ENCOUNTER — Telehealth: Payer: Self-pay | Admitting: Family Medicine

## 2013-04-04 MED ORDER — OXYMORPHONE HCL 10 MG PO TABS: 10.0000 mg | ORAL_TABLET | Freq: Four times a day (QID) | ORAL | Status: DC | PRN

## 2013-04-04 NOTE — Addendum Note (Signed)
Addended by: Vic Blackbird F on: 04/04/2013 02:07 PM   Modules accepted: Orders

## 2013-04-04 NOTE — Telephone Encounter (Signed)
medicatin refilled and pt coming to pick up

## 2013-04-04 NOTE — Telephone Encounter (Signed)
Pt aware.

## 2013-04-04 NOTE — Telephone Encounter (Signed)
Please let pt know we have verified her intake class- appt and new referral has been sent I will prescribe her medication through March, if she misses her pain appt/intake class, I will not prescribe any further

## 2013-04-04 NOTE — Telephone Encounter (Signed)
Pt is calling to get her pain medicine refilled Call back number is [phone]

## 2013-05-06 ENCOUNTER — Ambulatory Visit: Payer: Medicare Other | Admitting: Family Medicine

## 2013-05-09 ENCOUNTER — Ambulatory Visit (INDEPENDENT_AMBULATORY_CARE_PROVIDER_SITE_OTHER): Payer: Commercial Managed Care - HMO | Admitting: Family Medicine

## 2013-05-09 ENCOUNTER — Ambulatory Visit: Payer: Medicare Other | Admitting: Family Medicine

## 2013-05-09 VITALS — BP 140/80 | HR 80 | Temp 98.1°F | Resp 18 | Ht 62.0 in | Wt 204.0 lb

## 2013-05-09 DIAGNOSIS — J449 Chronic obstructive pulmonary disease, unspecified: Secondary | ICD-10-CM

## 2013-05-09 DIAGNOSIS — M549 Dorsalgia, unspecified: Secondary | ICD-10-CM

## 2013-05-09 DIAGNOSIS — N3281 Overactive bladder: Secondary | ICD-10-CM

## 2013-05-09 DIAGNOSIS — R7309 Other abnormal glucose: Secondary | ICD-10-CM

## 2013-05-09 DIAGNOSIS — G8929 Other chronic pain: Secondary | ICD-10-CM

## 2013-05-09 DIAGNOSIS — R7303 Prediabetes: Secondary | ICD-10-CM

## 2013-05-09 DIAGNOSIS — N318 Other neuromuscular dysfunction of bladder: Secondary | ICD-10-CM

## 2013-05-09 MED ORDER — ALBUTEROL SULFATE HFA 108 (90 BASE) MCG/ACT IN AERS: 2.0000 | INHALATION_SPRAY | RESPIRATORY_TRACT | Status: DC | PRN

## 2013-05-09 MED ORDER — MIRABEGRON ER 25 MG PO TB24: 25.0000 mg | ORAL_TABLET | Freq: Every day | ORAL | Status: DC

## 2013-05-09 MED ORDER — AMLODIPINE BESYLATE 10 MG PO TABS: 10.0000 mg | ORAL_TABLET | Freq: Every day | ORAL | Status: DC

## 2013-05-09 MED ORDER — ESOMEPRAZOLE MAGNESIUM 40 MG PO CPDR: DELAYED_RELEASE_CAPSULE | ORAL | Status: DC

## 2013-05-09 MED ORDER — TRAZODONE HCL 100 MG PO TABS: ORAL_TABLET | ORAL | Status: DC

## 2013-05-09 MED ORDER — OXYMORPHONE HCL 10 MG PO TABS
10.0000 mg | ORAL_TABLET | Freq: Four times a day (QID) | ORAL | Status: DC | PRN
Start: 2013-05-09 — End: 2013-05-23

## 2013-05-09 NOTE — Patient Instructions (Signed)
Continue current medications Take new bladder pill Myrbetriq Prescription for pain F/U 3 months

## 2013-05-11 ENCOUNTER — Encounter: Payer: Self-pay | Admitting: Family Medicine

## 2013-05-11 NOTE — Assessment & Plan Note (Signed)
Pt provided a meter, her A1C is up 6%, discussed healthy eating and need for weight loss to prevent development of DM

## 2013-05-11 NOTE — Progress Notes (Signed)
Patient ID: Paula Long, female   DOB: Nov 03, 1950, 63 y.o.   MRN: [id number]   Subjective:    Patient ID: Paula Long, female    DOB: 01/26/1951, 63 y.o.   MRN: [id number]  Patient presents for 2 mos follow up  She has no specific concerns, breathing is doing well with COPD meds, request refill on albuterol.  HTN- tolerating norvasc 10mg , no leg swelling Chronic pain- has intake appt March with pain clinic will then be scheduled for office visit, needs refill on opana. Has decided not to pursue second opinion regarding her back at this time   Review Of Systems:  GEN- denies fatigue, fever, weight loss,weakness, recent illness HEENT- denies eye drainage, change in vision, nasal discharge, CVS- denies chest pain, palpitations RESP- denies SOB, cough, wheeze ABD- denies N/V, change in stools, abd pain GU- denies dysuria, hematuria, dribbling, incontinence MSK- + joint pain, muscle aches, injury Neuro- denies headache, dizziness, syncope, seizure activity       Objective:    BP 140/80  Pulse 80  Temp(Src) 98.1 F (36.7 C) (Oral)  Resp 18  Ht 5\' 2"  (1.575 m)  Wt 204 lb (92.534 kg)  BMI 37.30 kg/m2 GEN- NAD, alert and oriented x3 HEENT- PERRL, EOMI, non injected sclera, pink conjunctiva, MMM, oropharynx clear CVS- RRR, no murmur RESP-CTAB, no wheeze  EXT- No edema Pulses- Radial 2+        Assessment & Plan:      Problem List Items Addressed This Visit   None      Note: This dictation was prepared with Dragon dictation along with smaller phrase technology. Any transcriptional errors that result from this process are unintentional.

## 2013-05-11 NOTE — Assessment & Plan Note (Addendum)
At end of visit pt noted her vesicare has not helped and wanted to try another medication RX Myrebitriq 25mg 

## 2013-05-11 NOTE — Assessment & Plan Note (Signed)
Currently stable, continue current meds, needs to work on tobacco cessation

## 2013-05-11 NOTE — Assessment & Plan Note (Signed)
I will provide her opana for the next 2 months until she has her first OV with the pain physician, I am aware the intake class does not prescribe any medications

## 2013-05-12 ENCOUNTER — Ambulatory Visit: Payer: Self-pay | Admitting: Family Medicine

## 2013-05-19 ENCOUNTER — Telehealth: Payer: Self-pay | Admitting: *Deleted

## 2013-05-19 NOTE — Telephone Encounter (Signed)
Pt called stating she is needing a letter to her insurance co, stating why she needs the requested medication OPANA in order for her insurance to pay, needs to be faxed over to [place] review at [phone].

## 2013-05-19 NOTE — Telephone Encounter (Signed)
Please call, pt pharmacy and see if this is a PA, I do not typically write any letters about a medication, there should be a form of some sort to complete

## 2013-05-22 ENCOUNTER — Telehealth: Payer: Self-pay | Admitting: *Deleted

## 2013-05-22 MED ORDER — AMLODIPINE BESYLATE 10 MG PO TABS: 10.0000 mg | ORAL_TABLET | Freq: Every day | ORAL | Status: DC

## 2013-05-22 MED ORDER — TRAZODONE HCL 100 MG PO TABS: ORAL_TABLET | ORAL | Status: DC

## 2013-05-22 MED ORDER — ESOMEPRAZOLE MAGNESIUM 40 MG PO CPDR: DELAYED_RELEASE_CAPSULE | ORAL | Status: DC

## 2013-05-22 MED ORDER — ALBUTEROL SULFATE HFA 108 (90 BASE) MCG/ACT IN AERS: 2.0000 | INHALATION_SPRAY | RESPIRATORY_TRACT | Status: DC | PRN

## 2013-05-22 NOTE — Telephone Encounter (Signed)
Patient is changing pharmacies and only needs new script for this medication for the mail order.

## 2013-05-22 NOTE — Telephone Encounter (Signed)
Received fax from [place] requesting refill on Opana.  Ok to refill??  Last office visit 05/09/2013.  Last refill 05/09/2013.

## 2013-05-23 MED ORDER — OXYMORPHONE HCL ER 5 MG PO TB12: 5.0000 mg | ORAL_TABLET | Freq: Two times a day (BID) | ORAL | Status: DC

## 2013-05-23 MED ORDER — OXYMORPHONE HCL 10 MG PO TABS: 10.0000 mg | ORAL_TABLET | Freq: Four times a day (QID) | ORAL | Status: DC | PRN

## 2013-05-23 NOTE — Telephone Encounter (Addendum)
She will be changed to 5mg  ER twice a day, this is the equivalent of her IR dose I do not write 3 month supply of pain meds and she will be in the pain clinic within the next 2 months- let her know that requested can not be done  RX for opana 10mg  IR #60 has been discarded

## 2013-05-23 NOTE — Telephone Encounter (Signed)
Call placed to patient and patient made aware. Reported that insurance will only pay for Opana ER (name brand) and is requesting 90 day supply.

## 2013-05-23 NOTE — Telephone Encounter (Signed)
LMTRC

## 2013-05-23 NOTE — Telephone Encounter (Signed)
Okay to refill- date must say 06/07/13, I assume this is in reference to previous phone call about it being mailed in, as this is a new pharmacy. She would still need to come get the prescription

## 2013-05-23 NOTE — Telephone Encounter (Signed)
See previous note

## 2013-05-26 NOTE — Telephone Encounter (Signed)
Call placed to patient and patient made aware.  

## 2013-06-06 ENCOUNTER — Telehealth: Payer: Self-pay | Admitting: *Deleted

## 2013-06-06 MED ORDER — OXYMORPHONE HCL 10 MG PO TABS: 10.0000 mg | ORAL_TABLET | Freq: Four times a day (QID) | ORAL | Status: DC | PRN

## 2013-06-06 NOTE — Telephone Encounter (Signed)
Received call from patient.   Reports that she was able to find Opana at [facility] [phone].   States that the medication is available in 10mg  strength.

## 2013-06-06 NOTE — Telephone Encounter (Signed)
Call placed to [facility] and [facility].   Opana 5mg  ER is on onational back order.   Patient appointment is scheduled for 07/15/2013.

## 2013-06-06 NOTE — Telephone Encounter (Signed)
She was given a script for the ER 5mg  Opana on 3/6 , no changes or refills can be made, she should have appt with the pain clinic for April

## 2013-06-06 NOTE — Telephone Encounter (Signed)
Received call from patient.   Reports that current pharmacy does not have Opana.  States that she has called other pharmacies and they do not have medication either.   States that she is on her last dose today.   MD please advise.

## 2013-06-06 NOTE — Telephone Encounter (Signed)
Since ER on back order Refilled her previous opana 10mg  #60

## 2013-06-09 ENCOUNTER — Encounter: Payer: Self-pay | Admitting: *Deleted

## 2013-06-09 NOTE — Telephone Encounter (Signed)
Call placed to patient and patient made aware.   Patient states that [place] was able to partially fill prescription and will fill the rest of the prescription when the next order comes in this week.   Printed prescription for Opana IR 10mg  discarded.  Patient states that she will be out of medication on 07/07/2013.  Her first appointment with the pain clinic is 07/15/2013.

## 2013-06-09 NOTE — Telephone Encounter (Signed)
This encounter was created in error - please disregard.

## 2013-07-04 ENCOUNTER — Telehealth: Payer: Self-pay | Admitting: *Deleted

## 2013-07-04 MED ORDER — OXYMORPHONE HCL ER 5 MG PO TB12: 5.0000 mg | ORAL_TABLET | Freq: Two times a day (BID) | ORAL | Status: DC

## 2013-07-04 NOTE — Telephone Encounter (Signed)
Received call from patient requesting refill on Opana. States 5mg  tablets do not control pain. Requested to increase dosage.   Ok to refill Opana 5mg  ER??  Last office visit 05/09/2013.  Last refill 06/07/2013.  First appointment with pain management 07/15/2013.

## 2013-07-04 NOTE — Telephone Encounter (Signed)
No dose change, okay to refill her current meds

## 2013-07-04 NOTE — Telephone Encounter (Signed)
Prescription printed and patient made aware to come to office to pick up per VM.  

## 2013-07-30 ENCOUNTER — Telehealth: Payer: Self-pay | Admitting: *Deleted

## 2013-07-30 MED ORDER — NYSTATIN 100000 UNIT/GM EX POWD: Freq: Two times a day (BID) | CUTANEOUS | Status: DC | PRN

## 2013-07-30 NOTE — Telephone Encounter (Signed)
Received fax from pharmacy requesting refill on Nystatin Powder.   Last ordered on 07/01/2012.  Ok to refill?

## 2013-07-30 NOTE — Telephone Encounter (Signed)
Prescription sent to pharmacy.

## 2013-07-30 NOTE — Telephone Encounter (Signed)
Okay to refill give 2 

## 2013-08-01 ENCOUNTER — Telehealth: Payer: Self-pay | Admitting: *Deleted

## 2013-08-01 NOTE — Telephone Encounter (Signed)
Faxed referral to [place] [facility] on 07/17/13 no response, faxing again today, pending authorization

## 2013-08-04 ENCOUNTER — Other Ambulatory Visit: Payer: Self-pay | Admitting: *Deleted

## 2013-08-04 MED ORDER — NYSTATIN 100000 UNIT/GM EX POWD: Freq: Two times a day (BID) | CUTANEOUS | Status: DC | PRN

## 2013-08-04 NOTE — Telephone Encounter (Signed)
Refill appropriate and filled per protocol. 

## 2013-08-05 NOTE — Telephone Encounter (Signed)
Faxed [employer] for status on referral, pending authorization.       

## 2013-08-06 NOTE — Telephone Encounter (Signed)
Received fax back from [place] with authorization number [id number] to Dr. Phillips Odor, faxed form to [facility] office.

## 2013-08-08 ENCOUNTER — Ambulatory Visit: Payer: Commercial Managed Care - HMO | Admitting: Family Medicine

## 2013-08-22 ENCOUNTER — Ambulatory Visit: Payer: Commercial Managed Care - HMO | Admitting: Family Medicine

## 2013-09-12 ENCOUNTER — Ambulatory Visit (INDEPENDENT_AMBULATORY_CARE_PROVIDER_SITE_OTHER): Payer: Commercial Managed Care - HMO | Admitting: Family Medicine

## 2013-09-12 ENCOUNTER — Encounter: Payer: Self-pay | Admitting: Family Medicine

## 2013-09-12 VITALS — BP 136/78 | HR 78 | Temp 97.8°F | Resp 16 | Ht 64.0 in | Wt 204.0 lb

## 2013-09-12 DIAGNOSIS — G4700 Insomnia, unspecified: Secondary | ICD-10-CM

## 2013-09-12 DIAGNOSIS — J411 Mucopurulent chronic bronchitis: Secondary | ICD-10-CM

## 2013-09-12 DIAGNOSIS — Z720 Tobacco use: Secondary | ICD-10-CM

## 2013-09-12 DIAGNOSIS — F5104 Psychophysiologic insomnia: Secondary | ICD-10-CM | POA: Insufficient documentation

## 2013-09-12 DIAGNOSIS — E669 Obesity, unspecified: Secondary | ICD-10-CM

## 2013-09-12 DIAGNOSIS — I10 Essential (primary) hypertension: Secondary | ICD-10-CM

## 2013-09-12 DIAGNOSIS — R7309 Other abnormal glucose: Secondary | ICD-10-CM

## 2013-09-12 DIAGNOSIS — R7303 Prediabetes: Secondary | ICD-10-CM

## 2013-09-12 DIAGNOSIS — F17200 Nicotine dependence, unspecified, uncomplicated: Secondary | ICD-10-CM

## 2013-09-12 DIAGNOSIS — J418 Mixed simple and mucopurulent chronic bronchitis: Secondary | ICD-10-CM

## 2013-09-12 LAB — CBC WITH DIFFERENTIAL/PLATELET
Basophils Absolute: 0.1 10*3/uL (ref 0.0–0.1)
Basophils Relative: 1 % (ref 0–1)
Eosinophils Absolute: 0.1 10*3/uL (ref 0.0–0.7)
Eosinophils Relative: 1 % (ref 0–5)
HCT: 43.5 % (ref 36.0–46.0)
Hemoglobin: 14.4 g/dL (ref 12.0–15.0)
Lymphocytes Relative: 25 % (ref 12–46)
Lymphs Abs: 2 10*3/uL (ref 0.7–4.0)
MCH: 30.3 pg (ref 26.0–34.0)
MCHC: 33.1 g/dL (ref 30.0–36.0)
MCV: 91.6 fL (ref 78.0–100.0)
Monocytes Absolute: 0.6 10*3/uL (ref 0.1–1.0)
Monocytes Relative: 7 % (ref 3–12)
Neutro Abs: 5.3 10*3/uL (ref 1.7–7.7)
Neutrophils Relative %: 66 % (ref 43–77)
Platelets: 291 10*3/uL (ref 150–400)
RBC: 4.75 MIL/uL (ref 3.87–5.11)
RDW: 15.2 % (ref 11.5–15.5)
WBC: 8 10*3/uL (ref 4.0–10.5)

## 2013-09-12 LAB — COMPREHENSIVE METABOLIC PANEL
ALT: 8 U/L (ref 0–35)
AST: 12 U/L (ref 0–37)
Albumin: 4.2 g/dL (ref 3.5–5.2)
Alkaline Phosphatase: 71 U/L (ref 39–117)
BUN: 14 mg/dL (ref 6–23)
CO2: 25 mEq/L (ref 19–32)
Calcium: 9.3 mg/dL (ref 8.4–10.5)
Chloride: 100 mEq/L (ref 96–112)
Creat: 1.02 mg/dL (ref 0.50–1.10)
Glucose, Bld: 88 mg/dL (ref 70–99)
Potassium: 4.7 mEq/L (ref 3.5–5.3)
Sodium: 138 mEq/L (ref 135–145)
Total Bilirubin: 0.3 mg/dL (ref 0.2–1.2)
Total Protein: 7.2 g/dL (ref 6.0–8.3)

## 2013-09-12 LAB — LIPID PANEL
Cholesterol: 117 mg/dL (ref 0–200)
HDL: 48 mg/dL (ref >39)
LDL Cholesterol: 49 mg/dL (ref 0–99)
Total CHOL/HDL Ratio: 2.4 Ratio
Triglycerides: 99 mg/dL (ref <150)
VLDL: 20 mg/dL (ref 0–40)

## 2013-09-12 LAB — HEMOGLOBIN A1C
Hgb A1c MFr Bld: 6 % — ABNORMAL HIGH (ref <5.7)
Mean Plasma Glucose: 126 mg/dL — ABNORMAL HIGH (ref <117)

## 2013-09-12 MED ORDER — TRAZODONE HCL 150 MG PO TABS: ORAL_TABLET | ORAL | Status: DC

## 2013-09-12 NOTE — Progress Notes (Signed)
Patient ID: Paula Long, female   DOB: 1950-08-27, 63 y.o.   MRN: [id number]   Subjective:    Patient ID: Paula Long, female    DOB: May 10, 1950, 63 y.o.   MRN: [id number]  Patient presents for 4 month F/U  Patient here to follow chronic medical problems. Her only concern is difficulty sleeping she wasn't to increase her trazodone to a higher dose. She does falsely with this but still wakes up some. She's not like the other medications as the coughing no friends when does not. She's also still in pain management for her chronic neck and back pain and is doing well care.  COPD she's not had any recent exacerbations with her lung she's taken her medications as prescribed.   Review Of Systems:  GEN- denies fatigue, fever, weight loss,weakness, recent illness HEENT- denies eye drainage, change in vision, nasal discharge, CVS- denies chest pain, palpitations RESP- denies SOB, cough, wheeze ABD- denies N/V, change in stools, abd pain GU- denies dysuria, hematuria, dribbling, incontinence MSK- + joint pain, muscle aches, injury Neuro- denies headache, dizziness, syncope, seizure activity       Objective:    BP 136/78  Pulse 78  Temp(Src) 97.8 F (36.6 C) (Oral)  Resp 16  Ht 5\' 4"  (1.626 m)  Wt 204 lb (92.534 kg)  BMI 35.00 kg/m2 GEN- NAD, alert and oriented x3 HEENT- PERRL, EOMI, non injected sclera, pink conjunctiva, MMM, oropharynx clear Neck- Supple, no thyromegaly CVS- RRR, no murmur RESP-CTAB  EXT- No edema Pulses- Radial 2+        Assessment & Plan:      Problem List Items Addressed This Visit   Tobacco user   Prediabetes   Essential hypertension, benign - Primary   COPD (chronic obstructive pulmonary disease)      Note: This dictation was prepared with Dragon dictation along with smaller phrase technology. Any transcriptional errors that result from this process are unintentional.

## 2013-09-12 NOTE — Assessment & Plan Note (Signed)
Blood pressure is well-controlled no change in medication 

## 2013-09-12 NOTE — Assessment & Plan Note (Signed)
Check A1c also discussed weight importance of dietary changes

## 2013-09-12 NOTE — Assessment & Plan Note (Signed)
She continues to smoke in setting of COPD but is trying to quit

## 2013-09-12 NOTE — Assessment & Plan Note (Signed)
She's currently stable on her current medications no changes

## 2013-09-12 NOTE — Assessment & Plan Note (Signed)
Increase trazodone to 150 mg at bedtime

## 2013-09-12 NOTE — Patient Instructions (Signed)
Increased trazodone to 150mg   Continue current medications We will call with lab results  F/U 4 months

## 2013-09-26 ENCOUNTER — Telehealth: Payer: Self-pay | Admitting: *Deleted

## 2013-09-26 NOTE — Telephone Encounter (Signed)
Message copied by Sheral Flow on Fri Sep 26, 2013 10:53 AM ------      Message from: Devoria Glassing      Created: Fri Sep 26, 2013 10:38 AM       Patient is calling to discuss her bladder issues with you       [phone] ------

## 2013-09-26 NOTE — Telephone Encounter (Signed)
Please tell her not to take others medications, I would not recommend DDVAP for her She can try Vesicare 5mg  at bedtime  #30 R 2

## 2013-09-26 NOTE — Telephone Encounter (Signed)
Call placed to patient to make aware. LMTRC. 

## 2013-09-26 NOTE — Telephone Encounter (Signed)
Call returned to patient.   States that she forgot to speak with MD during last visit about her frequent urination.   States that she tried desmopressin- acetate 2mg  x5 days from her sister's supply of medication.   States that during this time, she did not have to get up at night to go to the bathroom, and she was not going as frequently during the day.   MD please advise.

## 2013-09-29 MED ORDER — TRAZODONE HCL 150 MG PO TABS: ORAL_TABLET | ORAL | Status: DC

## 2013-09-29 NOTE — Telephone Encounter (Signed)
Call placed to patient.   States that she is allergic to [employer].   MD please advise.

## 2013-09-29 NOTE — Telephone Encounter (Signed)
Call placed to patient.   States that she has tried Myrbetriq in the past ans she kept a headache.

## 2013-09-29 NOTE — Telephone Encounter (Signed)
Try the Myrbetriiq 25mg , once a day, we may have samples she can try first If not send the prescription

## 2013-09-30 NOTE — Telephone Encounter (Signed)
Appointment scheduled.

## 2013-09-30 NOTE — Telephone Encounter (Signed)
Tell her schedule appt, I do not give out DDVAP this comes from Urology  Has she been on ditropan

## 2013-10-06 ENCOUNTER — Other Ambulatory Visit: Payer: Self-pay | Admitting: Family Medicine

## 2013-10-06 DIAGNOSIS — Z1231 Encounter for screening mammogram for malignant neoplasm of breast: Secondary | ICD-10-CM

## 2013-10-08 ENCOUNTER — Other Ambulatory Visit: Payer: Self-pay | Admitting: *Deleted

## 2013-10-08 MED ORDER — CLOTRIMAZOLE 1 % EX OINT: TOPICAL_OINTMENT | CUTANEOUS | Status: DC

## 2013-10-08 MED ORDER — HYDROCORTISONE 2.5 % RE CREA: TOPICAL_CREAM | Freq: Two times a day (BID) | RECTAL | Status: DC

## 2013-10-08 NOTE — Telephone Encounter (Signed)
Refill appropriate and filled per protocol. 

## 2013-10-13 ENCOUNTER — Encounter: Payer: Self-pay | Admitting: Family Medicine

## 2013-10-13 ENCOUNTER — Ambulatory Visit (INDEPENDENT_AMBULATORY_CARE_PROVIDER_SITE_OTHER): Payer: Commercial Managed Care - HMO | Admitting: Family Medicine

## 2013-10-13 VITALS — BP 136/74 | HR 64 | Temp 97.6°F | Resp 14 | Ht 64.0 in | Wt 202.0 lb

## 2013-10-13 DIAGNOSIS — N3281 Overactive bladder: Secondary | ICD-10-CM

## 2013-10-13 DIAGNOSIS — N318 Other neuromuscular dysfunction of bladder: Secondary | ICD-10-CM

## 2013-10-13 DIAGNOSIS — N3946 Mixed incontinence: Secondary | ICD-10-CM | POA: Insufficient documentation

## 2013-10-13 MED ORDER — HYDROCORTISONE 2.5 % RE CREA: TOPICAL_CREAM | Freq: Two times a day (BID) | RECTAL | Status: DC

## 2013-10-13 MED ORDER — NYSTATIN 100000 UNIT/GM EX POWD: Freq: Two times a day (BID) | CUTANEOUS | Status: DC | PRN

## 2013-10-13 MED ORDER — CLOTRIMAZOLE 1 % EX OINT: TOPICAL_OINTMENT | CUTANEOUS | Status: DC

## 2013-10-13 MED ORDER — FESOTERODINE FUMARATE ER 4 MG PO TB24: 4.0000 mg | ORAL_TABLET | Freq: Every day | ORAL | Status: DC

## 2013-10-13 NOTE — Patient Instructions (Addendum)
Try the Paula Long for your bladder Continue current medications If the bladder medicine does not help then referral to urology We will call with urine results F/U as previous

## 2013-10-13 NOTE — Progress Notes (Signed)
Patient ID: Paula Long, female   DOB: 1950-04-20, 63 y.o.   MRN: [id number]   Subjective:    Patient ID: Paula Long, female    DOB: 01-03-1951, 63 y.o.   MRN: [id number]  Patient presents for Urinary Frequency  patient here with urinary frequency as well as some incontinence. She has a voiding schedule for the past 10 days. She goes to bathroom on average 15 times a day and 3 times at night she typically has one accident per day which she cannot make it to the restroom. She started been tried on Vesicare ( no improvement), Ditropan ( Rash), Myrtebeiq caused headache. She denies dysuria or constipation. She denies any blood in the urine. She has had a hysterectomy. She does feel like she empties her bladder but that time she only dribbles her urine       Review Of Systems:  GEN- denies fatigue, fever, weight loss,weakness, recent illness HEENT- denies eye drainage, change in vision, nasal discharge, CVS- denies chest pain, palpitations RESP- denies SOB, cough, wheeze ABD- denies N/V, change in stools, abd pain GU- denies dysuria, hematuria, dribbling,+ incontinence MSK- denies joint pain, muscle aches, injury Neuro- denies headache, dizziness, syncope, seizure activity       Objective:    BP 136/74  Pulse 64  Temp(Src) 97.6 F (36.4 C) (Oral)  Resp 14  Ht 5\' 4"  (1.626 m)  Wt 202 lb (91.627 kg)  BMI 34.66 kg/m2 GEN- NAD, alert and oriented x3 CVS- RRR, no murmur RESP-CTAB ABD-NABS,soft,NT,ND         Assessment & Plan:      Problem List Items Addressed This Visit   Urinary incontinence, mixed - Primary   Relevant Medications      fesoterodine (TOVIAZ) ER tablet   Other Relevant Orders      Urinalysis, Routine w reflex microscopic      Urine culture      Note: This dictation was prepared with Dragon dictation along with smaller phrase technology. Any transcriptional errors that result from this process are unintentional.

## 2013-10-14 LAB — URINALYSIS, ROUTINE W REFLEX MICROSCOPIC
Bilirubin Urine: NEGATIVE
Glucose, UA: NEGATIVE mg/dL
Hgb urine dipstick: NEGATIVE
Ketones, ur: NEGATIVE mg/dL
Nitrite: NEGATIVE
Protein, ur: NEGATIVE mg/dL
Specific Gravity, Urine: 1.006 (ref 1.005–1.030)
Urobilinogen, UA: 0.2 mg/dL (ref 0.0–1.0)
pH: 6 (ref 5.0–8.0)

## 2013-10-14 LAB — URINALYSIS, MICROSCOPIC ONLY
Bacteria, UA: NONE SEEN
Casts: NONE SEEN
Crystals: NONE SEEN

## 2013-10-14 NOTE — Assessment & Plan Note (Signed)
She appears to have overactive bladder as well as some incontinence. Will try another medication Toviaz to see if this helps without any symptoms as the others did give relief she does had some side effects. If this does not improve her symptoms she will proceed with urology for evaluation and treatment

## 2013-10-15 ENCOUNTER — Other Ambulatory Visit: Payer: Self-pay | Admitting: *Deleted

## 2013-10-15 LAB — URINE CULTURE: Colony Count: 4000

## 2013-10-15 MED ORDER — CLOTRIMAZOLE 1 % EX CREA: TOPICAL_CREAM | CUTANEOUS | Status: DC

## 2013-10-15 NOTE — Telephone Encounter (Signed)
Refill appropriate and filled per protocol. 

## 2013-10-24 ENCOUNTER — Telehealth: Payer: Self-pay | Admitting: Family Medicine

## 2013-10-24 MED ORDER — KETOCONAZOLE 2 % EX CREA: 1.0000 "application " | TOPICAL_CREAM | Freq: Two times a day (BID) | CUTANEOUS | Status: DC

## 2013-10-24 NOTE — Telephone Encounter (Signed)
[phone] (can leave a message) [name] states that the cream that was called in for the rash the insurance does not cover it anymore and she is wanting to know if she could have something else called in

## 2013-10-24 NOTE — Telephone Encounter (Signed)
Prescription sent to pharmacy.

## 2013-10-24 NOTE — Telephone Encounter (Signed)
Call returned to patient.   States that [facility] is no longer paying for Clotrimazole 1% Cream. States that she requires this cream for her rash under her breasts.  Requested to have MD advise on new medication.   [employer] does not cover Clotrimazole since it is OTC, but does cover Ketoconazole 2%  Cream.

## 2013-10-24 NOTE — Telephone Encounter (Signed)
Okay to send ketoconazole apply BID

## 2013-11-07 ENCOUNTER — Ambulatory Visit (HOSPITAL_COMMUNITY): Payer: Medicare Other

## 2013-11-14 ENCOUNTER — Ambulatory Visit (HOSPITAL_COMMUNITY)
Admission: RE | Admit: 2013-11-14 | Discharge: 2013-11-14 | Disposition: A | Discharge status: Home / Self Care | Payer: Medicare HMO | Source: Ambulatory Visit | Attending: Family Medicine | Admitting: Family Medicine

## 2013-11-14 DIAGNOSIS — Z1231 Encounter for screening mammogram for malignant neoplasm of breast: Secondary | ICD-10-CM | POA: Insufficient documentation

## 2014-01-12 ENCOUNTER — Ambulatory Visit: Payer: Commercial Managed Care - HMO | Admitting: Family Medicine

## 2014-01-20 ENCOUNTER — Ambulatory Visit (INDEPENDENT_AMBULATORY_CARE_PROVIDER_SITE_OTHER): Payer: Commercial Managed Care - HMO | Admitting: Family Medicine

## 2014-01-20 ENCOUNTER — Other Ambulatory Visit: Payer: Self-pay | Admitting: Neurology

## 2014-01-20 VITALS — BP 138/72 | HR 76 | Temp 98.1°F | Resp 14 | Ht 64.0 in | Wt 201.0 lb

## 2014-01-20 DIAGNOSIS — E669 Obesity, unspecified: Secondary | ICD-10-CM

## 2014-01-20 DIAGNOSIS — N3946 Mixed incontinence: Secondary | ICD-10-CM

## 2014-01-20 DIAGNOSIS — I10 Essential (primary) hypertension: Secondary | ICD-10-CM

## 2014-01-20 DIAGNOSIS — L304 Erythema intertrigo: Secondary | ICD-10-CM

## 2014-01-20 DIAGNOSIS — K589 Irritable bowel syndrome without diarrhea: Secondary | ICD-10-CM

## 2014-01-20 DIAGNOSIS — Z1211 Encounter for screening for malignant neoplasm of colon: Secondary | ICD-10-CM

## 2014-01-20 LAB — CBC WITH DIFFERENTIAL/PLATELET
Basophils Absolute: 0.1 10*3/uL (ref 0.0–0.1)
Basophils Relative: 1 % (ref 0–1)
Eosinophils Absolute: 0.1 10*3/uL (ref 0.0–0.7)
Eosinophils Relative: 1 % (ref 0–5)
HCT: 42.2 % (ref 36.0–46.0)
Hemoglobin: 14.8 g/dL (ref 12.0–15.0)
Lymphocytes Relative: 31 % (ref 12–46)
Lymphs Abs: 2.5 10*3/uL (ref 0.7–4.0)
MCH: 31.7 pg (ref 26.0–34.0)
MCHC: 35.1 g/dL (ref 30.0–36.0)
MCV: 90.4 fL (ref 78.0–100.0)
Monocytes Absolute: 0.6 10*3/uL (ref 0.1–1.0)
Monocytes Relative: 7 % (ref 3–12)
Neutro Abs: 4.9 10*3/uL (ref 1.7–7.7)
Neutrophils Relative %: 60 % (ref 43–77)
Platelets: 295 10*3/uL (ref 150–400)
RBC: 4.67 MIL/uL (ref 3.87–5.11)
RDW: 15 % (ref 11.5–15.5)
WBC: 8.2 10*3/uL (ref 4.0–10.5)

## 2014-01-20 LAB — COMPREHENSIVE METABOLIC PANEL
ALT: <8 U/L (ref 0–35)
AST: 12 U/L (ref 0–37)
Albumin: 4 g/dL (ref 3.5–5.2)
Alkaline Phosphatase: 74 U/L (ref 39–117)
BUN: 13 mg/dL (ref 6–23)
CO2: 31 mEq/L (ref 19–32)
Calcium: 8.9 mg/dL (ref 8.4–10.5)
Chloride: 98 mEq/L (ref 96–112)
Creat: 1.11 mg/dL — ABNORMAL HIGH (ref 0.50–1.10)
Glucose, Bld: 84 mg/dL (ref 70–99)
Potassium: 4.7 mEq/L (ref 3.5–5.3)
Sodium: 134 mEq/L — ABNORMAL LOW (ref 135–145)
Total Bilirubin: 0.2 mg/dL (ref 0.2–1.2)
Total Protein: 6.6 g/dL (ref 6.0–8.3)

## 2014-01-20 LAB — TSH: TSH: 1.325 u[IU]/mL (ref 0.350–4.500)

## 2014-01-20 LAB — VITAMIN B12: Vitamin B-12: 328 pg/mL (ref 211–911)

## 2014-01-21 LAB — VITAMIN D 25 HYDROXY (VIT D DEFICIENCY, FRACTURES): Vit D, 25-Hydroxy: 18 ng/mL — ABNORMAL LOW (ref 30–89)

## 2014-01-23 ENCOUNTER — Encounter: Payer: Self-pay | Admitting: Family Medicine

## 2014-01-23 DIAGNOSIS — K589 Irritable bowel syndrome without diarrhea: Secondary | ICD-10-CM | POA: Insufficient documentation

## 2014-01-23 LAB — METHYLMALONIC ACID, SERUM: Methylmalonic Acid, Quant: 178 nmol/L (ref 87–318)

## 2014-01-23 MED ORDER — CLOTRIMAZOLE 1 % EX CREA: 1.0000 "application " | TOPICAL_CREAM | Freq: Two times a day (BID) | CUTANEOUS | Status: DC

## 2014-01-23 NOTE — Assessment & Plan Note (Signed)
Well controlled, no change to meds 

## 2014-01-23 NOTE — Assessment & Plan Note (Signed)
Clotrimazole BID

## 2014-01-23 NOTE — Progress Notes (Signed)
Patient ID: Paula Long, female   DOB: 1950/07/20, 63 y.o.   MRN: [id number]   Subjective:    Patient ID: Paula Long, female    DOB: Feb 13, 1951, 63 y.o.   MRN: [id number]  Patient presents for Follow-up  This note is a late entry as the computer system was down on Tuesday, November 3.  Patient presented for follow-up. She continues to have difficulty with overactive bladder I tried her on a different medication Toviaz with no improvement she is due to be referred to urology for further evaluation.  She's been seen by the pain clinic and recently increased her gabapentin to 3 times a day and she is doing well with this.  She's had both diarrhea and constipation on and off for the past few months she has never had a colonoscopy and will like to proceed with this evaluation.  Insomnia she is doing well with trazodone 150 mg  State nizoral cream does not help beneath breast, wants old cream " clotimazole" Review Of Systems:  GEN- denies fatigue, fever, weight loss,weakness, recent illness HEENT- denies eye drainage, change in vision, nasal discharge, CVS- denies chest pain, palpitations RESP- denies SOB, cough, wheeze ABD- denies N/V, change in stools, abd pain GU- denies dysuria, hematuria, dribbling, =incontinence MSK-+oint pain, muscle aches, injury Neuro- denies headache, dizziness, syncope, seizure activity       Objective:    BP 138/72 mmHg  Pulse 76  Temp(Src) 98.1 F (36.7 C)  Resp 14  Ht 5\' 4"  (1.626 m)  Wt 201 lb (91.173 kg)  BMI 34.48 kg/m2 GEN- NAD, alert and oriented x3 HEENT- PERRL, EOMI, non injected sclera, pink conjunctiva, MMM, oropharynx clear CVS- RRR, no murmur RESP-CTAB ABD-NABS,soft,NT,ND EXT- No edema Pulses- Radial, DP- 2+        Assessment & Plan:      Problem List Items Addressed This Visit    None      Note: This dictation was prepared with Dragon dictation along with smaller phrase technology. Any transcriptional  errors that result from this process are unintentional.

## 2014-01-23 NOTE — Assessment & Plan Note (Signed)
IBS both constipation and loose,stools, bloating, send for coloncoloscopy

## 2014-01-23 NOTE — Assessment & Plan Note (Signed)
Referral to urology

## 2014-01-27 ENCOUNTER — Telehealth: Payer: Self-pay | Admitting: Family Medicine

## 2014-01-28 ENCOUNTER — Encounter (INDEPENDENT_AMBULATORY_CARE_PROVIDER_SITE_OTHER): Payer: Self-pay | Admitting: *Deleted

## 2014-02-19 ENCOUNTER — Ambulatory Visit (INDEPENDENT_AMBULATORY_CARE_PROVIDER_SITE_OTHER): Payer: Medicare HMO | Admitting: Internal Medicine

## 2014-02-19 ENCOUNTER — Telehealth (INDEPENDENT_AMBULATORY_CARE_PROVIDER_SITE_OTHER): Payer: Self-pay | Admitting: *Deleted

## 2014-02-19 ENCOUNTER — Encounter (INDEPENDENT_AMBULATORY_CARE_PROVIDER_SITE_OTHER): Payer: Self-pay | Admitting: Internal Medicine

## 2014-02-19 ENCOUNTER — Other Ambulatory Visit (INDEPENDENT_AMBULATORY_CARE_PROVIDER_SITE_OTHER): Payer: Self-pay | Admitting: *Deleted

## 2014-02-19 ENCOUNTER — Telehealth: Payer: Self-pay | Admitting: *Deleted

## 2014-02-19 VITALS — BP 134/52 | HR 72 | Temp 97.3°F | Ht 65.0 in | Wt 198.8 lb

## 2014-02-19 DIAGNOSIS — Z800 Family history of malignant neoplasm of digestive organs: Secondary | ICD-10-CM

## 2014-02-19 DIAGNOSIS — Z1211 Encounter for screening for malignant neoplasm of colon: Secondary | ICD-10-CM

## 2014-02-19 NOTE — Telephone Encounter (Signed)
Received a call from Butch Penny at Dr. Dereck Leep office stating needing a [employer] referral for pt to be seen, submitted referral thru acuity connect with authorization number [id number]

## 2014-02-19 NOTE — Patient Instructions (Signed)
Screening colonoscopy.The risks and benefits such as perforation, bleeding, and infection were reviewed with the patient and is agreeable. 

## 2014-02-19 NOTE — Telephone Encounter (Signed)
Patient needs trilyte 

## 2014-02-19 NOTE — Progress Notes (Signed)
   Subjective:    Patient ID: Paula Long, female    DOB: [birth year], 63 y.o.   MRN: [id number]  HPI Referred to our office by Dr. Buelah Manis screening colonoscopy. She has never undergone a colonoscopy in the past. Grandfather hx of colon cancer in his early 13s. Appetite is good. No weight loss. No dysphagia.  Hx of dysphagia and underwent an EGD/ED 2011 or 2012 ? By Dr. Laural Golden. No abdominal pain. She does have back pain. She is on chronic pain medicaiton.    Review of Systems Married, Two children in good health. Patient disabled.    Objective:   Physical Exam  Filed Vitals:   02/19/14 0946  Height: 5\' 5"  (1.651 m)  Weight: 198 lb 12.8 oz (90.175 kg)   Alert and oriented. Skin warm and dry. Oral mucosa is moist.   . Sclera anicteric, conjunctivae is pink. Thyroid not enlarged. No cervical lymphadenopathy. Lungs clear. Heart regular rate and rhythm.  Abdomen is soft. Bowel sounds are positive. No hepatomegaly. No abdominal masses felt. No tenderness.  No edema to lower extremities.          Assessment & Plan:  Screening colonoscopy. No GI problems. Family hx of colon cancer grandfather in his early 15s.

## 2014-02-20 ENCOUNTER — Telehealth: Payer: Self-pay | Admitting: *Deleted

## 2014-02-20 NOTE — Telephone Encounter (Signed)
Received fax from [facility] care mgmt with authorization number [id number] to Dr. Allen Norris. Faxed information to [employer] office

## 2014-02-24 MED ORDER — PEG 3350-KCL-NA BICARB-NACL 420 G PO SOLR: 4000.0000 mL | Freq: Once | ORAL | Status: DC

## 2014-02-27 ENCOUNTER — Other Ambulatory Visit: Payer: Self-pay | Admitting: Family Medicine

## 2014-02-27 ENCOUNTER — Telehealth: Payer: Self-pay | Admitting: Family Medicine

## 2014-02-27 NOTE — Telephone Encounter (Signed)
[phone]  PT states that Paula Long will be faxing over refill request for inhaler and nexium and when they do she needs to a 90 day supply of them both

## 2014-02-27 NOTE — Telephone Encounter (Signed)
Refill request received and filled per protocol for 90 day supply.

## 2014-02-27 NOTE — Telephone Encounter (Signed)
Refill appropriate and filled per protocol. 

## 2014-03-12 ENCOUNTER — Other Ambulatory Visit: Payer: Self-pay | Admitting: Family Medicine

## 2014-03-16 NOTE — Telephone Encounter (Signed)
OK refill?  Is she using long term?

## 2014-03-16 NOTE — Telephone Encounter (Signed)
Okay to refill? 

## 2014-03-23 NOTE — Patient Instructions (Signed)
Paula Long  03/23/2014   Your procedure is scheduled on:  Monday, 03/30/14  Report to [facility] at [place] AM.  Call this number if you have problems the morning of surgery: [phone]   Remember:   Do not eat food or drink liquids after midnight.   Take these medicines the morning of surgery with A SIP OF WATER: nexium, gabapentin, and oxymorphone. Please use and bring your albuterol and proair.   Do not wear jewelry, make-up or nail polish.  Do not wear lotions, powders, or perfumes. You may wear deodorant.  Do not shave 48 hours prior to surgery. Men may shave face and neck.  Do not bring valuables to the hospital.  [facility] is not responsible                  for any belongings or valuables.               Contacts, dentures or bridgework may not be worn into surgery.  Leave suitcase in the car. After surgery it may be brought to your room.  For patients admitted to the hospital, discharge time is determined by your                treatment team.               Patients discharged the day of surgery will not be allowed to drive  home.  Name and phone number of your driver: family  Special Instructions: Please follow instructions regarding diet that Dr. Olevia Perches office sent you.   Please read over the following fact sheets that you were given: Pain Booklet, Anesthesia Post-op Instructions and Care and Recovery After Surgery    Colonoscopy A colonoscopy is an exam to look at the entire large intestine (colon). This exam can help find problems such as tumors, polyps, inflammation, and areas of bleeding. The exam takes about 1 hour.  LET [facility] CARE PROVIDER KNOW ABOUT:   Any allergies you have.  All medicines you are taking, including vitamins, herbs, eye drops, creams, and over-the-counter medicines.  Previous problems you or members of your family have had with the use of anesthetics.  Any blood disorders you have.  Previous surgeries you have had.  Medical  conditions you have. RISKS AND COMPLICATIONS  Generally, this is a safe procedure. However, as with any procedure, complications can occur. Possible complications include:  Bleeding.  Tearing or rupture of the colon wall.  Reaction to medicines given during the exam.  Infection (rare). BEFORE THE PROCEDURE   Ask your health care provider about changing or stopping your regular medicines.  You may be prescribed an oral bowel prep. This involves drinking a large amount of medicated liquid, starting the day before your procedure. The liquid will cause you to have multiple loose stools until your stool is almost clear or light green. This cleans out your colon in preparation for the procedure.  Do not eat or drink anything else once you have started the bowel prep, unless your health care provider tells you it is safe to do so.  Arrange for someone to drive you home after the procedure. PROCEDURE   You will be given medicine to help you relax (sedative).  You will lie on your side with your knees bent.  A long, flexible tube with a light and camera on the end (colonoscope) will be inserted through the rectum and into the colon. The camera sends video back to a  computer screen as it moves through the colon. The colonoscope also releases carbon dioxide gas to inflate the colon. This helps your health care provider see the area better.  During the exam, your health care provider may take a small tissue sample (biopsy) to be examined under a microscope if any abnormalities are found.  The exam is finished when the entire colon has been viewed. AFTER THE PROCEDURE   Do not drive for 24 hours after the exam.  You may have a small amount of blood in your stool.  You may pass moderate amounts of gas and have mild abdominal cramping or bloating. This is caused by the gas used to inflate your colon during the exam.  Ask when your test results will be ready and how you will get your results.  Make sure you get your test results. Document Released: 03/03/2000 Document Revised: 12/25/2012 Document Reviewed: 11/11/2012 [facility] Patient Information 2015 [place], [place]. This information is not intended to replace advice given to you by your health care provider. Make sure you discuss any questions you have with your health care provider. Colonoscopy, Care After Refer to this sheet in the next few weeks. These instructions provide you with information on caring for yourself after your procedure. Your health care provider may also give you more specific instructions. Your treatment has been planned according to current medical practices, but problems sometimes occur. Call your health care provider if you have any problems or questions after your procedure. WHAT TO EXPECT AFTER THE PROCEDURE  After your procedure, it is typical to have the following:  A small amount of blood in your stool.  Moderate amounts of gas and mild abdominal cramping or bloating. HOME CARE INSTRUCTIONS  Do not drive, operate machinery, or sign important documents for 24 hours.  You may shower and resume your regular physical activities, but move at a slower pace for the first 24 hours.  Take frequent rest periods for the first 24 hours.  Walk around or put a warm pack on your abdomen to help reduce abdominal cramping and bloating.  Drink enough fluids to keep your urine clear or pale yellow.  You may resume your normal diet as instructed by your health care provider. Avoid heavy or fried foods that are hard to digest.  Avoid drinking alcohol for 24 hours or as instructed by your health care provider.  Only take over-the-counter or prescription medicines as directed by your health care provider.  If a tissue sample (biopsy) was taken during your procedure:  Do not take aspirin or blood thinners for 7 days, or as instructed by your health care provider.  Do not drink alcohol for 7 days, or as instructed by  your health care provider.  Eat soft foods for the first 24 hours. SEEK MEDICAL CARE IF: You have persistent spotting of blood in your stool 2-3 days after the procedure. SEEK IMMEDIATE MEDICAL CARE IF:  You have more than a small spotting of blood in your stool.  You pass large blood clots in your stool.  Your abdomen is swollen (distended).  You have nausea or vomiting.  You have a fever.  You have increasing abdominal pain that is not relieved with medicine. Document Released: 10/19/2003 Document Revised: 12/25/2012 Document Reviewed: 11/11/2012 [facility] Patient Information 2015 [place], [place]. This information is not intended to replace advice given to you by your health care provider. Make sure you discuss any questions you have with your health care provider. PATIENT INSTRUCTIONS POST-ANESTHESIA  IMMEDIATELY FOLLOWING SURGERY:  Do not drive or operate machinery for the first twenty four hours after surgery.  Do not make any important decisions for twenty four hours after surgery or while taking narcotic pain medications or sedatives.  If you develop intractable nausea and vomiting or a severe headache please notify your doctor immediately.  FOLLOW-UP:  Please make an appointment with your surgeon as instructed. You do not need to follow up with anesthesia unless specifically instructed to do so.  WOUND CARE INSTRUCTIONS (if applicable):  Keep a dry clean dressing on the anesthesia/puncture wound site if there is drainage.  Once the wound has quit draining you may leave it open to air.  Generally you should leave the bandage intact for twenty four hours unless there is drainage.  If the epidural site drains for more than 36-48 hours please call the anesthesia department.  QUESTIONS?:  Please feel free to call your physician or the hospital operator if you have any questions, and they will be happy to assist you.

## 2014-03-24 ENCOUNTER — Encounter (INDEPENDENT_AMBULATORY_CARE_PROVIDER_SITE_OTHER): Payer: Self-pay | Admitting: *Deleted

## 2014-03-24 ENCOUNTER — Encounter (HOSPITAL_COMMUNITY)
Admission: RE | Admit: 2014-03-24 | Discharge: 2014-03-24 | Disposition: A | Discharge status: Home / Self Care | Payer: Medicare HMO | Source: Ambulatory Visit | Attending: Internal Medicine | Admitting: Internal Medicine

## 2014-04-02 ENCOUNTER — Telehealth: Payer: Self-pay | Admitting: *Deleted

## 2014-04-02 NOTE — Telephone Encounter (Signed)
Submitted [employer] referral thru acuity connect for authorization to Dr. 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

## 2014-05-05 ENCOUNTER — Ambulatory Visit (INDEPENDENT_AMBULATORY_CARE_PROVIDER_SITE_OTHER): Payer: Medicare HMO | Admitting: Urology

## 2014-05-05 DIAGNOSIS — R32 Unspecified urinary incontinence: Secondary | ICD-10-CM | POA: Diagnosis not present

## 2014-05-05 DIAGNOSIS — N3946 Mixed incontinence: Secondary | ICD-10-CM | POA: Diagnosis not present

## 2014-05-11 DIAGNOSIS — Z79899 Other long term (current) drug therapy: Secondary | ICD-10-CM | POA: Diagnosis not present

## 2014-05-11 DIAGNOSIS — R252 Cramp and spasm: Secondary | ICD-10-CM | POA: Diagnosis not present

## 2014-05-11 DIAGNOSIS — M159 Polyosteoarthritis, unspecified: Secondary | ICD-10-CM | POA: Diagnosis not present

## 2014-05-11 DIAGNOSIS — M545 Low back pain: Secondary | ICD-10-CM | POA: Diagnosis not present

## 2014-05-22 ENCOUNTER — Encounter: Payer: Self-pay | Admitting: Family Medicine

## 2014-05-22 ENCOUNTER — Ambulatory Visit (INDEPENDENT_AMBULATORY_CARE_PROVIDER_SITE_OTHER): Payer: Commercial Managed Care - HMO | Admitting: Family Medicine

## 2014-05-22 VITALS — BP 138/72 | HR 76 | Resp 16 | Ht 65.0 in | Wt 200.0 lb

## 2014-05-22 DIAGNOSIS — I10 Essential (primary) hypertension: Secondary | ICD-10-CM

## 2014-05-22 DIAGNOSIS — M542 Cervicalgia: Secondary | ICD-10-CM | POA: Diagnosis not present

## 2014-05-22 DIAGNOSIS — L299 Pruritus, unspecified: Secondary | ICD-10-CM

## 2014-05-22 DIAGNOSIS — K219 Gastro-esophageal reflux disease without esophagitis: Secondary | ICD-10-CM | POA: Diagnosis not present

## 2014-05-22 DIAGNOSIS — F418 Other specified anxiety disorders: Secondary | ICD-10-CM

## 2014-05-22 MED ORDER — AMLODIPINE BESYLATE 5 MG PO TABS: 5.0000 mg | ORAL_TABLET | Freq: Every day | ORAL | Status: DC

## 2014-05-22 MED ORDER — ESCITALOPRAM OXALATE 10 MG PO TABS: 10.0000 mg | ORAL_TABLET | Freq: Every day | ORAL | Status: DC

## 2014-05-22 MED ORDER — PERMETHRIN 5 % EX CREA: 1.0000 "application " | TOPICAL_CREAM | Freq: Once | CUTANEOUS | Status: DC

## 2014-05-22 MED ORDER — DEXLANSOPRAZOLE 60 MG PO CPDR
60.0000 mg | DELAYED_RELEASE_CAPSULE | Freq: Every day | ORAL | Status: DC
Start: 2014-05-22 — End: 2014-05-26

## 2014-05-22 NOTE — Patient Instructions (Addendum)
Take 1 tab gabapentin in morning and 2 at night  Restart blood pressure medicine norvasc 5mg  once a day  New medicine lexapro for your nerves Follow up with your pain doctor about your neck/legs Use cream in hair and on skin Try the dexilant  F/U 3 weeks

## 2014-05-24 NOTE — Progress Notes (Signed)
Patient ID: Paula Long, female   DOB: September 22, 1950, 64 y.o.   MRN: [id number]   Subjective:    Patient ID: Paula Long, female    DOB: December 14, 1950, 64 y.o.   MRN: [id number]  Patient presents for 4 month F/U; HTN; Open areas to face; Neck Pain; Generalized Itching; L Ankle Pain; and Visual Disturbances  Pt here with multiple problems. Advised she will need follow-up to discuss everything.  1. Itching all over, exposed to lice a few weeks ago, used OTC lice treatment, had some children at her home who had outbreak  2. Chronic neck pain , worse past few weeks, has not seen her neurosurgeon, knows she has DDD, also in pain clinic, no new radicular symptoms  3. Feels stressed out, not on good terms with her sister, finds herself crying on a whim, her mind races a lot, she thinks she is going crazy because she sees things in her periphery, at times felt like she saw the outline of a face on the wall. No audio hallucinations,   4. HTN- has been monitoring BP, noticed it has been elevating, at specialist office BP in 150's, was on meds prior   5. GERD- meds no longer helping  Review Of Systems:  GEN- denies fatigue, fever, weight loss,weakness, recent illness HEENT- denies eye drainage, change in vision, nasal discharge, CVS- denies chest pain, palpitations RESP- denies SOB, cough, wheeze ABD- denies N/V, change in stools, +abd pain GU- denies dysuria, hematuria, dribbling, incontinence MSK- + joint pain, muscle aches, injury Neuro- denies headache, dizziness, syncope, seizure activity       Objective:    BP 138/72 mmHg  Pulse 76  Resp 16  Ht 5\' 5"  (1.651 m)  Wt 200 lb (90.719 kg)  BMI 33.28 kg/m2 GEN- NAD, alert and oriented x3 HEENT- PERRL, EOMI, non injected sclera, pink conjunctiva, MMM, oropharynx clear CVS- RRR, no murmur RESP-CTAB ABD-NABS,soft,NT,ND Skin- no lesion, mild excoriations in scalp, no nits or live lice seen Psych- tearful, depressed affect,  not anxious appearing, no SI, no apparent hallucinations EXT- No edema Pulses- Radial 2+        Assessment & Plan:      Problem List Items Addressed This Visit      Unprioritized   Neck pain    Chronic pain advised to f/u with her specialist for this On chronic pain meds already      GERD (gastroesophageal reflux disease)    Trial of dexilant, given samples from office      Relevant Medications   dexlansoprazole (DEXILANT) 60 MG capsule   Essential hypertension, benign - Primary    Start novasc 5mg  once a day  Monitor at home      Relevant Medications   amLODIpine (NORVASC) tablet   Depression with anxiety    Trial of lexapro 10mg  once a day        Other Visit Diagnoses    Pruritic dermatitis        No lice seen, no specific rash, will cover bed bugs/scabies, given permethrin       Note: This dictation was prepared with Dragon dictation along with smaller phrase technology. Any transcriptional errors that result from this process are unintentional.

## 2014-05-24 NOTE — Assessment & Plan Note (Signed)
Trial of dexilant, given samples from office

## 2014-05-24 NOTE — Assessment & Plan Note (Addendum)
Trial of lexapro 10mg  once a day  ? Related to the visual disturbances in periphery Denies any gross vision changes Will f/u in 4 weeks and see how she is doing, we may need eye appt but I want to treat mood first

## 2014-05-24 NOTE — Assessment & Plan Note (Signed)
Chronic pain advised to f/u with her specialist for this On chronic pain meds already

## 2014-05-24 NOTE — Assessment & Plan Note (Signed)
Start novasc 5mg  once a day  Monitor at home

## 2014-05-26 ENCOUNTER — Telehealth: Payer: Self-pay | Admitting: Family Medicine

## 2014-05-26 MED ORDER — DEXLANSOPRAZOLE 60 MG PO CPDR: 60.0000 mg | DELAYED_RELEASE_CAPSULE | Freq: Every day | ORAL | Status: DC

## 2014-05-26 NOTE — Telephone Encounter (Signed)
[phone]    dexilant  Patient got samples of this and it worked well, would like this called into [place] if possible

## 2014-05-26 NOTE — Telephone Encounter (Signed)
Prescription sent to pharmacy.

## 2014-05-28 NOTE — Patient Instructions (Signed)
Paula Long  05/28/2014   Your procedure is scheduled on:  06/03/2014  Report to [facility] at  66  AM.  Call this number if you have problems the morning of surgery: [phone]   Remember:   Do not eat food or drink liquids after midnight.   Take these medicines the morning of surgery with A SIP OF WATER:  Amlodipine, dexilant, lexapro, gabapentin   Do not wear jewelry, make-up or nail polish.  Do not wear lotions, powders, or perfumes.   Do not shave 48 hours prior to surgery. Men may shave face and neck.  Do not bring valuables to the hospital.  [facility] is not responsible for any belongings or valuables.               Contacts, dentures or bridgework may not be worn into surgery.  Leave suitcase in the car. After surgery it may be brought to your room.  For patients admitted to the hospital, discharge time is determined by your treatment team.               Patients discharged the day of surgery will not be allowed to drive home.  Name and phone number of your driver: family  Special Instructions: N/A   Please read over the following fact sheets that you were given: Pain Booklet, Coughing and Deep Breathing, Surgical Site Infection Prevention, Anesthesia Post-op Instructions and Care and Recovery After Surgery Colonoscopy A colonoscopy is an exam to look at the entire large intestine (colon). This exam can help find problems such as tumors, polyps, inflammation, and areas of bleeding. The exam takes about 1 hour.  LET [facility] CARE PROVIDER KNOW ABOUT:   Any allergies you have.  All medicines you are taking, including vitamins, herbs, eye drops, creams, and over-the-counter medicines.  Previous problems you or members of your family have had with the use of anesthetics.  Any blood disorders you have.  Previous surgeries you have had.  Medical conditions you have. RISKS AND COMPLICATIONS  Generally, this is a safe procedure. However, as with any  procedure, complications can occur. Possible complications include:  Bleeding.  Tearing or rupture of the colon wall.  Reaction to medicines given during the exam.  Infection (rare). BEFORE THE PROCEDURE   Ask your health care provider about changing or stopping your regular medicines.  You may be prescribed an oral bowel prep. This involves drinking a large amount of medicated liquid, starting the day before your procedure. The liquid will cause you to have multiple loose stools until your stool is almost clear or light green. This cleans out your colon in preparation for the procedure.  Do not eat or drink anything else once you have started the bowel prep, unless your health care provider tells you it is safe to do so.  Arrange for someone to drive you home after the procedure. PROCEDURE   You will be given medicine to help you relax (sedative).  You will lie on your side with your knees bent.  A long, flexible tube with a light and camera on the end (colonoscope) will be inserted through the rectum and into the colon. The camera sends video back to a computer screen as it moves through the colon. The colonoscope also releases carbon dioxide gas to inflate the colon. This helps your health care provider see the area better.  During the exam, your health care provider may take a small tissue sample (  biopsy) to be examined under a microscope if any abnormalities are found.  The exam is finished when the entire colon has been viewed. AFTER THE PROCEDURE   Do not drive for 24 hours after the exam.  You may have a small amount of blood in your stool.  You may pass moderate amounts of gas and have mild abdominal cramping or bloating. This is caused by the gas used to inflate your colon during the exam.  Ask when your test results will be ready and how you will get your results. Make sure you get your test results. Document Released: 03/03/2000 Document Revised: 12/25/2012 Document  Reviewed: 11/11/2012 [facility] Patient Information 2015 [place], [place]. This information is not intended to replace advice given to you by your health care provider. Make sure you discuss any questions you have with your health care provider. PATIENT INSTRUCTIONS POST-ANESTHESIA  IMMEDIATELY FOLLOWING SURGERY:  Do not drive or operate machinery for the first twenty four hours after surgery.  Do not make any important decisions for twenty four hours after surgery or while taking narcotic pain medications or sedatives.  If you develop intractable nausea and vomiting or a severe headache please notify your doctor immediately.  FOLLOW-UP:  Please make an appointment with your surgeon as instructed. You do not need to follow up with anesthesia unless specifically instructed to do so.  WOUND CARE INSTRUCTIONS (if applicable):  Keep a dry clean dressing on the anesthesia/puncture wound site if there is drainage.  Once the wound has quit draining you may leave it open to air.  Generally you should leave the bandage intact for twenty four hours unless there is drainage.  If the epidural site drains for more than 36-48 hours please call the anesthesia department.  QUESTIONS?:  Please feel free to call your physician or the hospital operator if you have any questions, and they will be happy to assist you.

## 2014-05-29 ENCOUNTER — Encounter (HOSPITAL_COMMUNITY): Payer: Self-pay

## 2014-05-29 ENCOUNTER — Encounter (HOSPITAL_COMMUNITY)
Admission: RE | Admit: 2014-05-29 | Discharge: 2014-05-29 | Disposition: A | Discharge status: Home / Self Care | Payer: Medicare HMO | Source: Ambulatory Visit | Attending: Internal Medicine | Admitting: Internal Medicine

## 2014-05-29 ENCOUNTER — Other Ambulatory Visit: Payer: Self-pay

## 2014-05-29 VITALS — BP 163/51 | HR 66 | Temp 97.4°F | Resp 18 | Ht 65.0 in | Wt 198.0 lb

## 2014-05-29 DIAGNOSIS — Z01812 Encounter for preprocedural laboratory examination: Secondary | ICD-10-CM | POA: Insufficient documentation

## 2014-05-29 DIAGNOSIS — Z1211 Encounter for screening for malignant neoplasm of colon: Secondary | ICD-10-CM | POA: Diagnosis not present

## 2014-05-29 DIAGNOSIS — Z800 Family history of malignant neoplasm of digestive organs: Secondary | ICD-10-CM

## 2014-05-29 DIAGNOSIS — Z01810 Encounter for preprocedural cardiovascular examination: Secondary | ICD-10-CM | POA: Insufficient documentation

## 2014-05-29 HISTORY — DX: Fibromyalgia: M79.7

## 2014-05-29 HISTORY — DX: Anxiety disorder, unspecified: F41.9

## 2014-05-29 LAB — CBC
HCT: 44.4 % (ref 36.0–46.0)
Hemoglobin: 14.5 g/dL (ref 12.0–15.0)
MCH: 31.1 pg (ref 26.0–34.0)
MCHC: 32.7 g/dL (ref 30.0–36.0)
MCV: 95.3 fL (ref 78.0–100.0)
Platelets: 255 10*3/uL (ref 150–400)
RBC: 4.66 MIL/uL (ref 3.87–5.11)
RDW: 15.2 % (ref 11.5–15.5)
WBC: 6.5 10*3/uL (ref 4.0–10.5)

## 2014-05-29 LAB — BASIC METABOLIC PANEL
Anion gap: 6 (ref 5–15)
BUN: 17 mg/dL (ref 6–23)
CO2: 26 mmol/L (ref 19–32)
Calcium: 9 mg/dL (ref 8.4–10.5)
Chloride: 106 mmol/L (ref 96–112)
Creatinine, Ser: 1.09 mg/dL (ref 0.50–1.10)
GFR calc Af Amer: 62 mL/min — ABNORMAL LOW (ref >90)
GFR calc non Af Amer: 53 mL/min — ABNORMAL LOW (ref >90)
Glucose, Bld: 89 mg/dL (ref 70–99)
Potassium: 4.1 mmol/L (ref 3.5–5.1)
Sodium: 138 mmol/L (ref 135–145)

## 2014-05-29 NOTE — Pre-Procedure Instructions (Signed)
Patient given information to sign up for my chart at home. 

## 2014-06-03 ENCOUNTER — Ambulatory Visit (HOSPITAL_COMMUNITY): Payer: Commercial Managed Care - HMO | Admitting: Anesthesiology

## 2014-06-03 ENCOUNTER — Encounter (HOSPITAL_COMMUNITY): Payer: Self-pay | Admitting: *Deleted

## 2014-06-03 ENCOUNTER — Encounter (HOSPITAL_COMMUNITY)
Admission: RE | Disposition: A | Discharge status: Home / Self Care | Payer: Self-pay | Source: Ambulatory Visit | Attending: Internal Medicine

## 2014-06-03 ENCOUNTER — Ambulatory Visit (HOSPITAL_COMMUNITY)
Admission: RE | Admit: 2014-06-03 | Discharge: 2014-06-03 | Disposition: A | Discharge status: Home / Self Care | Payer: Commercial Managed Care - HMO | Source: Ambulatory Visit | Attending: Internal Medicine | Admitting: Internal Medicine

## 2014-06-03 DIAGNOSIS — Z90710 Acquired absence of both cervix and uterus: Secondary | ICD-10-CM | POA: Insufficient documentation

## 2014-06-03 DIAGNOSIS — Z882 Allergy status to sulfonamides status: Secondary | ICD-10-CM | POA: Insufficient documentation

## 2014-06-03 DIAGNOSIS — K6289 Other specified diseases of anus and rectum: Secondary | ICD-10-CM | POA: Insufficient documentation

## 2014-06-03 DIAGNOSIS — D125 Benign neoplasm of sigmoid colon: Secondary | ICD-10-CM | POA: Insufficient documentation

## 2014-06-03 DIAGNOSIS — I10 Essential (primary) hypertension: Secondary | ICD-10-CM | POA: Diagnosis not present

## 2014-06-03 DIAGNOSIS — K648 Other hemorrhoids: Secondary | ICD-10-CM | POA: Diagnosis not present

## 2014-06-03 DIAGNOSIS — Z800 Family history of malignant neoplasm of digestive organs: Secondary | ICD-10-CM | POA: Diagnosis not present

## 2014-06-03 DIAGNOSIS — F419 Anxiety disorder, unspecified: Secondary | ICD-10-CM | POA: Insufficient documentation

## 2014-06-03 DIAGNOSIS — Z85828 Personal history of other malignant neoplasm of skin: Secondary | ICD-10-CM | POA: Insufficient documentation

## 2014-06-03 DIAGNOSIS — D123 Benign neoplasm of transverse colon: Secondary | ICD-10-CM | POA: Insufficient documentation

## 2014-06-03 DIAGNOSIS — G8929 Other chronic pain: Secondary | ICD-10-CM | POA: Diagnosis not present

## 2014-06-03 DIAGNOSIS — K644 Residual hemorrhoidal skin tags: Secondary | ICD-10-CM | POA: Diagnosis not present

## 2014-06-03 DIAGNOSIS — K219 Gastro-esophageal reflux disease without esophagitis: Secondary | ICD-10-CM | POA: Insufficient documentation

## 2014-06-03 DIAGNOSIS — Z888 Allergy status to other drugs, medicaments and biological substances status: Secondary | ICD-10-CM | POA: Diagnosis not present

## 2014-06-03 DIAGNOSIS — M797 Fibromyalgia: Secondary | ICD-10-CM | POA: Diagnosis not present

## 2014-06-03 DIAGNOSIS — M549 Dorsalgia, unspecified: Secondary | ICD-10-CM | POA: Diagnosis not present

## 2014-06-03 DIAGNOSIS — J449 Chronic obstructive pulmonary disease, unspecified: Secondary | ICD-10-CM | POA: Insufficient documentation

## 2014-06-03 DIAGNOSIS — M5135 Other intervertebral disc degeneration, thoracolumbar region: Secondary | ICD-10-CM | POA: Diagnosis not present

## 2014-06-03 DIAGNOSIS — F17210 Nicotine dependence, cigarettes, uncomplicated: Secondary | ICD-10-CM | POA: Diagnosis not present

## 2014-06-03 DIAGNOSIS — Z885 Allergy status to narcotic agent status: Secondary | ICD-10-CM | POA: Insufficient documentation

## 2014-06-03 DIAGNOSIS — Z1211 Encounter for screening for malignant neoplasm of colon: Secondary | ICD-10-CM | POA: Insufficient documentation

## 2014-06-03 HISTORY — PX: POLYPECTOMY: SHX5525

## 2014-06-03 HISTORY — PX: COLONOSCOPY WITH PROPOFOL: SHX5780

## 2014-06-03 SURGERY — COLONOSCOPY WITH PROPOFOL
Anesthesia: Monitor Anesthesia Care

## 2014-06-03 MED ORDER — GLYCOPYRROLATE 0.2 MG/ML IJ SOLN
0.2000 mg | Freq: Once | INTRAMUSCULAR | Status: AC
  Administered 2014-06-03: 0.2 mg via INTRAVENOUS

## 2014-06-03 MED ORDER — PROPOFOL 10 MG/ML IV BOLUS
INTRAVENOUS | Status: AC
  Filled 2014-06-03: qty 20

## 2014-06-03 MED ORDER — ONDANSETRON HCL 4 MG/2ML IJ SOLN
INTRAMUSCULAR | Status: AC
  Filled 2014-06-03: qty 2

## 2014-06-03 MED ORDER — FENTANYL CITRATE 0.05 MG/ML IJ SOLN
25.0000 ug | INTRAMUSCULAR | Status: AC
  Administered 2014-06-03: 25 ug via INTRAVENOUS

## 2014-06-03 MED ORDER — FENTANYL CITRATE 0.05 MG/ML IJ SOLN
INTRAMUSCULAR | Status: AC
  Filled 2014-06-03: qty 2

## 2014-06-03 MED ORDER — MIDAZOLAM HCL 2 MG/2ML IJ SOLN
INTRAMUSCULAR | Status: AC
  Filled 2014-06-03: qty 2

## 2014-06-03 MED ORDER — ALBUTEROL SULFATE (2.5 MG/3ML) 0.083% IN NEBU
INHALATION_SOLUTION | RESPIRATORY_TRACT | Status: AC
  Filled 2014-06-03: qty 3

## 2014-06-03 MED ORDER — GLYCOPYRROLATE 0.2 MG/ML IJ SOLN
INTRAMUSCULAR | Status: AC
  Filled 2014-06-03: qty 1

## 2014-06-03 MED ORDER — ONDANSETRON HCL 4 MG/2ML IJ SOLN
4.0000 mg | Freq: Once | INTRAMUSCULAR | Status: AC
  Administered 2014-06-03: 4 mg via INTRAVENOUS

## 2014-06-03 MED ORDER — STERILE WATER FOR IRRIGATION IR SOLN
Status: DC | PRN
  Administered 2014-06-03: 1000 mL

## 2014-06-03 MED ORDER — ONDANSETRON HCL 4 MG/2ML IJ SOLN: 4.0000 mg | Freq: Once | INTRAMUSCULAR | Status: DC | PRN

## 2014-06-03 MED ORDER — FENTANYL CITRATE 0.05 MG/ML IJ SOLN: 25.0000 ug | INTRAMUSCULAR | Status: DC | PRN

## 2014-06-03 MED ORDER — ALBUTEROL SULFATE (2.5 MG/3ML) 0.083% IN NEBU
2.5000 mg | INHALATION_SOLUTION | Freq: Once | RESPIRATORY_TRACT | Status: AC
  Administered 2014-06-03: 2.5 mg via RESPIRATORY_TRACT

## 2014-06-03 MED ORDER — MIDAZOLAM HCL 2 MG/2ML IJ SOLN
1.0000 mg | INTRAMUSCULAR | Status: DC | PRN
  Administered 2014-06-03: 2 mg via INTRAVENOUS

## 2014-06-03 MED ORDER — SODIUM CHLORIDE 0.9 % IN NEBU
INHALATION_SOLUTION | RESPIRATORY_TRACT | Status: AC
  Filled 2014-06-03: qty 3

## 2014-06-03 MED ORDER — LACTATED RINGERS IV SOLN
INTRAVENOUS | Status: DC
  Administered 2014-06-03: 07:00:00 via INTRAVENOUS

## 2014-06-03 MED ORDER — PROPOFOL INFUSION 10 MG/ML OPTIME
INTRAVENOUS | Status: DC | PRN
  Administered 2014-06-03: 08:00:00 via INTRAVENOUS
  Administered 2014-06-03: 100 ug/kg/min via INTRAVENOUS

## 2014-06-03 MED ORDER — FENTANYL CITRATE 0.05 MG/ML IJ SOLN
INTRAMUSCULAR | Status: DC | PRN
  Administered 2014-06-03: 25 ug via INTRAVENOUS
  Administered 2014-06-03: 50 ug via INTRAVENOUS

## 2014-06-03 MED ORDER — LIDOCAINE HCL (PF) 1 % IJ SOLN
INTRAMUSCULAR | Status: AC
  Filled 2014-06-03: qty 5

## 2014-06-03 SURGICAL SUPPLY — 24 items
ELECT REM PT RETURN 9FT ADLT (ELECTROSURGICAL)
ELECTRODE REM PT RTRN 9FT ADLT (ELECTROSURGICAL) IMPLANT
FCP BXJMBJMB 240X2.8X (CUTTING FORCEPS)
FLOOR PAD 36X40 (MISCELLANEOUS) ×3
FORCEPS BIOP RAD 4 LRG CAP 4 (CUTTING FORCEPS) ×2 IMPLANT
FORCEPS BIOP RJ4 240 W/NDL (CUTTING FORCEPS)
FORCEPS BXJMBJMB 240X2.8X (CUTTING FORCEPS) IMPLANT
FORMALIN 10 PREFIL 20ML (MISCELLANEOUS) ×4 IMPLANT
INJECTOR/SNARE I SNARE (MISCELLANEOUS) IMPLANT
KIT CLEAN ENDO COMPLIANCE (KITS) ×3 IMPLANT
LUBRICANT JELLY 4.5OZ STERILE (MISCELLANEOUS) ×2 IMPLANT
MANIFOLD NEPTUNE II (INSTRUMENTS) ×2 IMPLANT
NDL SCLEROTHERAPY 25GX240 (NEEDLE) IMPLANT
NEEDLE SCLEROTHERAPY 25GX240 (NEEDLE) IMPLANT
PAD FLOOR 36X40 (MISCELLANEOUS) IMPLANT
PROBE APC STR FIRE (PROBE) IMPLANT
PROBE INJECTION GOLD (MISCELLANEOUS)
PROBE INJECTION GOLD 7FR (MISCELLANEOUS) IMPLANT
SNARE ROTATE MED OVAL 20MM (MISCELLANEOUS) ×2 IMPLANT
SNARE SHORT THROW 13M SML OVAL (MISCELLANEOUS) ×1 IMPLANT
SYR 50ML LL SCALE MARK (SYRINGE) ×2 IMPLANT
TRAP SPECIMEN MUCOUS 40CC (MISCELLANEOUS) IMPLANT
TUBING IRRIGATION ENDOGATOR (MISCELLANEOUS) ×2 IMPLANT
WATER STERILE IRR 1000ML POUR (IV SOLUTION) ×2 IMPLANT

## 2014-06-03 NOTE — Op Note (Signed)
COLONOSCOPY PROCEDURE REPORT  PATIENT:  Paula Long  MR#:  [id number] Birthdate:  05-Jun-1950, 64 y.o., female Endoscopist:  Dr. Rogene Houston, MD Referred By:  Dr. Vic Blackbird, MD  Procedure Date: 06/03/2014  Procedure:   Colonoscopy  Indications:  Patient is 64 year old Caucasian female was undergoing average risk screening colonoscopy. This is patient's first exam. Family history is positive for CRC and paternal grandfather who was 20 at the time of diagnosis.  Informed Consent:  The procedure and risks were reviewed with the patient and informed consent was obtained.  Medications:  Monitored anesthesia care. Please see anesthesia records for details.  Description of procedure:  After a digital rectal exam was performed, that colonoscope was advanced from the anus through the rectum and colon to the area of the cecum, ileocecal valve and appendiceal orifice. The cecum was deeply intubated. These structures were well-seen and photographed for the record. From the level of the cecum and ileocecal valve, the scope was slowly and cautiously withdrawn. The mucosal surfaces were carefully surveyed utilizing scope tip to flexion to facilitate fold flattening as needed. The scope was pulled down into the rectum where a thorough exam including retroflexion was performed.  Findings:   Prep excellent. 7 mm polyp hot snared from hepatic flexure. 4 mm polyp hot snared from proximal sigmoid colon. 10 mm pedunculated polyp hot snare from mid sigmoid colon. Normal rectal mucosa. Small hemorrhoids below the dentate line along with single anal papilla.   Therapeutic/Diagnostic Maneuvers Performed:  See above  Complications:  None  Cecal Withdrawal Time:  16 minutes  Impression:  Examination performed to cecum. 7 mm polyp hot snared from hepatic flexure. 4 mm polyp hot snared from sigmoid colon. 10 mm polyp hot snared from sigmoid colon. Small external hemorrhoids and single anal  papilla.  Polyp from hepatic flexure and proximal sigmoid were submitted together.  Recommendations:  Standard instructions given. I will contact patient with biopsy results and further recommendations.  [name]  06/03/2014 8:17 AM  CC: Dr. Vic Blackbird, MD & Dr. Rayne Du ref. provider found

## 2014-06-03 NOTE — H&P (Signed)
Paula Long is an 64 y.o. female.   Chief Complaint: Patientis here for colonoscopy. HPI: Patient is 64 year old Caucasian female who is in for screening colonoscopy. This is patient's first exam. She denies abdominal pain change in bowel habits or rectal bleeding. Family history significant for CRC and paternal grandfather who was around 15. Father had lung carcinoma at 27 and mother had vaginal cancer in her 34s.  Past Medical History  Diagnosis Date  . GERD (gastroesophageal reflux disease)   . Chronic back pain   . Normal cardiac stress test 06/2009  . Hypertension 12-07-11    presently no meds  . COPD (chronic obstructive pulmonary disease) 12-07-11    pt. uses nebulizer as needed and inhaler daily  . Shortness of breath 12-07-11    less now, occ wheezes  . Arthritis 12-07-11    arthritis,DDD,spinal stenosis  . Hemorrhoids   . Skin cancer     melanoma.  face 2014  . Anxiety   . Fibromyalgia     skin cancer    Past Surgical History  Procedure Laterality Date  . Abdominal hysterectomy    . Left ankle surgery      tendonitis  . Right wrist surgery for pinched nerve    . Trigger finger surgery  12-07-11    rt. middle trigger finger release  . Lumbar laminectomy/decompression microdiscectomy  12/11/2011    Procedure: LUMBAR LAMINECTOMY/DECOMPRESSION MICRODISCECTOMY;  Surgeon: Tobi Bastos, MD;  Location: [facility];  Service: Orthopedics;  Laterality: Left;  Decompressive Lumbar Laminectomy L5-S1 on Left  . Back surgery  12-07-11    2'10 L5 x2    Family History  Problem Relation Age of Onset  . Cancer Mother     uterine  . Cancer Father     lung   Social History:  reports that she has been smoking Cigarettes.  She has a 96 pack-year smoking history. She has never used smokeless tobacco. She reports that she does not drink alcohol or use illicit drugs.  Allergies:  Allergies  Allergen Reactions  . Sulfa Antibiotics   . Percocet [Oxycodone-Acetaminophen] Rash   Oral rash  . Vesicare [Solifenacin] Rash  . Vicodin [Hydrocodone-Acetaminophen] Rash    Oral rash    Medications Prior to Admission  Medication Sig Dispense Refill  . escitalopram (LEXAPRO) 10 MG tablet Take 1 tablet (10 mg total) by mouth daily. 30 tablet 1  . gabapentin (NEURONTIN) 300 MG capsule Take 300 mg by mouth 3 (three) times daily.     . hydrocortisone (ANUSOL-HC) 2.5 % rectal cream Place 1 application rectally 3 times/day as needed-between meals & bedtime for hemorrhoids or itching.    . nystatin (MYCOSTATIN) powder APPLY TOPICALLY TWO TIMES DAILY AS NEEDED. 60 g 2  . oxymorphone (OPANA ER) 10 MG 12 hr tablet Take 10 mg by mouth every 12 (twelve) hours.    . permethrin (ACTICIN) 5 % cream Apply 1 application topically once. Apply to hair and neck down for 8 hours then rinse 60 g 1  . polyethylene glycol-electrolytes (NULYTELY/GOLYTELY) 420 G solution Take 4,000 mLs by mouth once. 4000 mL 0  . traZODone (DESYREL) 150 MG tablet TAKE ONE TABLET BY MOUTH AT BEDTIME. 90 tablet 3  . amLODipine (NORVASC) 5 MG tablet Take 1 tablet (5 mg total) by mouth daily. 90 tablet 3  . dexlansoprazole (DEXILANT) 60 MG capsule Take 1 capsule (60 mg total) by mouth daily. 90 capsule 3    No results found for this or any  previous visit (from the past 48 hour(s)). No results found.  ROS  Blood pressure 164/81, pulse 62, temperature 98.8 F (37.1 C), temperature source Oral, resp. rate 20, height 5\' 5"  (1.651 m), weight 198 lb (89.812 kg), SpO2 100 %. Physical Exam  Constitutional: She appears well-developed and well-nourished.  HENT:  Mouth/Throat: Oropharynx is clear and moist.  Eyes: Conjunctivae are normal. No scleral icterus.  Neck: No thyromegaly present.  Cardiovascular: Normal rate, regular rhythm and normal heart sounds.   No murmur heard. Respiratory: Effort normal and breath sounds normal.  GI: Soft. She exhibits no distension and no mass. There is no tenderness.  Musculoskeletal:  She exhibits no edema.  Lymphadenopathy:    She has no cervical adenopathy.  Neurological: She is alert.  Skin: Skin is warm and dry.     Assessment/Plan Average risk screening colonoscopy under monitored anesthesia care.  REHMAN,NAJEEB U 06/03/2014, 7:26 AM

## 2014-06-03 NOTE — Anesthesia Preprocedure Evaluation (Signed)
Anesthesia Evaluation  Patient identified by MRN, date of birth, ID band Patient awake    Reviewed: Allergy & Precautions, H&P , NPO status , Patient's Chart, lab work & pertinent test results  Airway Mallampati: II  TM Distance: >3 FB Neck ROM: full    Dental no notable dental hx. (+) Edentulous Upper, Edentulous Lower   Pulmonary neg pulmonary ROS, shortness of breath and with exertion, COPD COPD inhaler, Current Smoker,  breath sounds clear to auscultation  Pulmonary exam normal       Cardiovascular Exercise Tolerance: Good hypertension, Pt. on medications negative cardio ROS  Rhythm:regular Rate:Normal     Neuro/Psych PSYCHIATRIC DISORDERS Anxiety negative neurological ROS  negative psych ROS   GI/Hepatic negative GI ROS, Neg liver ROS, GERD-  Medicated and Controlled,  Endo/Other  negative endocrine ROSPre diabetes  Renal/GU negative Renal ROS  negative genitourinary   Musculoskeletal  (+) Arthritis -, Fibromyalgia -  Abdominal   Peds  Hematology negative hematology ROS (+)   Anesthesia Other Findings   Reproductive/Obstetrics negative OB ROS                             Anesthesia Physical Anesthesia Plan  ASA: III  Anesthesia Plan: MAC   Post-op Pain Management:    Induction: Intravenous  Airway Management Planned: Simple Face Mask  Additional Equipment:   Intra-op Plan:   Post-operative Plan:   Informed Consent: I have reviewed the patients History and Physical, chart, labs and discussed the procedure including the risks, benefits and alternatives for the proposed anesthesia with the patient or authorized representative who has indicated his/her understanding and acceptance.     Plan Discussed with:   Anesthesia Plan Comments: (Albuterol jet nebs preop.)        Anesthesia Quick Evaluation

## 2014-06-03 NOTE — Addendum Note (Signed)
Addendum  created 06/03/14 7366 by Ollen Bowl, CRNA   Modules edited: Anesthesia Medication Administration

## 2014-06-03 NOTE — Transfer of Care (Signed)
Immediate Anesthesia Transfer of Care Note  Patient: Paula Long  Procedure(s) Performed: Procedure(s): COLONOSCOPY WITH PROPOFOL; IN CECUM AT 0749; TOTAL WITHDRAWAL TIME 17 MINUTES (N/A) POLYPECTOMY (N/A)  Patient Location: PACU  Anesthesia Type:MAC  Level of Consciousness: awake, alert  and oriented  Airway & Oxygen Therapy: Patient Spontanous Breathing  Post-op Assessment: Report given to RN  Post vital signs: Reviewed  Last Vitals:  Filed Vitals:   06/03/14 0725  BP: 164/81  Pulse:   Temp:   Resp: 20    Complications: No apparent anesthesia complications

## 2014-06-03 NOTE — Anesthesia Postprocedure Evaluation (Signed)
  Anesthesia Post-op Note  Patient: Paula Long  Procedure(s) Performed: Procedure(s): COLONOSCOPY WITH PROPOFOL; IN CECUM AT 0749; TOTAL WITHDRAWAL TIME 17 MINUTES (N/A) POLYPECTOMY (N/A)  Patient Location: PACU  Anesthesia Type:MAC  Level of Consciousness: awake, alert  and oriented  Airway and Oxygen Therapy: Patient Spontanous Breathing and Patient connected to nasal cannula oxygen  Post-op Pain: none  Post-op Assessment: Post-op Vital signs reviewed, Patient's Cardiovascular Status Stable, Respiratory Function Stable, Patent Airway and No signs of Nausea or vomiting  Post-op Vital Signs: Reviewed and stable  Last Vitals:  Filed Vitals:   06/03/14 0725  BP: 164/81  Pulse:   Temp:   Resp: 20    Complications: No apparent anesthesia complications

## 2014-06-03 NOTE — Discharge Instructions (Signed)
No aspirin or NSAIDs for 1 week. Resume usual medications and diet. No driving for 24 hours. Physician will call with biopsy results.    Colonoscopy, Care After These instructions give you information on caring for yourself after your procedure. Your doctor may also give you more specific instructions. Call your doctor if you have any problems or questions after your procedure. HOME CARE  Do not drive for 24 hours.  Do not sign important papers or use machinery for 24 hours.  You may shower.  You may go back to your usual activities, but go slower for the first 24 hours.  Take rest breaks often during the first 24 hours.  Walk around or use warm packs on your belly (abdomen) if you have belly cramping or gas.  Drink enough fluids to keep your pee (urine) clear or pale yellow.  Resume your normal diet. Avoid heavy or fried foods.  Avoid drinking alcohol for 24 hours or as told by your doctor.  Only take medicines as told by your doctor. If a tissue sample (biopsy) was taken during the procedure:   Do not take aspirin or blood thinners for 7 days, or as told by your doctor.  Do not drink alcohol for 7 days, or as told by your doctor.  Eat soft foods for the first 24 hours. GET HELP IF: You still have a small amount of blood in your poop (stool) 2-3 days after the procedure. GET HELP RIGHT AWAY IF:  You have more than a small amount of blood in your poop.  You see clumps of tissue (blood clots) in your poop.  Your belly is puffy (swollen).  You feel sick to your stomach (nauseous) or throw up (vomit).  You have a fever.  You have belly pain that gets worse and medicine does not help. MAKE SURE YOU:  Understand these instructions.  Will watch your condition.  Will get help right away if you are not doing well or get worse. Document Released: 04/08/2010 Document Revised: 03/11/2013 Document Reviewed: 11/11/2012 [facility] Patient Information 2015 [place], [place].  This information is not intended to replace advice given to you by your health care provider. Make sure you discuss any questions you have with your health care provider.

## 2014-06-04 ENCOUNTER — Encounter (HOSPITAL_COMMUNITY): Payer: Self-pay | Admitting: Internal Medicine

## 2014-06-10 ENCOUNTER — Encounter: Payer: Self-pay | Admitting: Family Medicine

## 2014-06-10 ENCOUNTER — Ambulatory Visit (INDEPENDENT_AMBULATORY_CARE_PROVIDER_SITE_OTHER): Payer: Commercial Managed Care - HMO | Admitting: Family Medicine

## 2014-06-10 VITALS — BP 148/78 | HR 76 | Temp 97.8°F | Resp 14 | Ht 65.0 in | Wt 199.0 lb

## 2014-06-10 DIAGNOSIS — M545 Low back pain: Secondary | ICD-10-CM | POA: Diagnosis not present

## 2014-06-10 DIAGNOSIS — R7309 Other abnormal glucose: Secondary | ICD-10-CM | POA: Diagnosis not present

## 2014-06-10 DIAGNOSIS — R252 Cramp and spasm: Secondary | ICD-10-CM | POA: Diagnosis not present

## 2014-06-10 DIAGNOSIS — F418 Other specified anxiety disorders: Secondary | ICD-10-CM

## 2014-06-10 DIAGNOSIS — N3946 Mixed incontinence: Secondary | ICD-10-CM | POA: Diagnosis not present

## 2014-06-10 DIAGNOSIS — I10 Essential (primary) hypertension: Secondary | ICD-10-CM | POA: Diagnosis not present

## 2014-06-10 DIAGNOSIS — M542 Cervicalgia: Secondary | ICD-10-CM | POA: Diagnosis not present

## 2014-06-10 DIAGNOSIS — M159 Polyosteoarthritis, unspecified: Secondary | ICD-10-CM | POA: Diagnosis not present

## 2014-06-10 DIAGNOSIS — K219 Gastro-esophageal reflux disease without esophagitis: Secondary | ICD-10-CM | POA: Diagnosis not present

## 2014-06-10 DIAGNOSIS — M549 Dorsalgia, unspecified: Secondary | ICD-10-CM | POA: Diagnosis not present

## 2014-06-10 DIAGNOSIS — G8929 Other chronic pain: Secondary | ICD-10-CM | POA: Diagnosis not present

## 2014-06-10 DIAGNOSIS — R7303 Prediabetes: Secondary | ICD-10-CM

## 2014-06-10 LAB — LIPID PANEL
Cholesterol: 123 mg/dL (ref 0–200)
HDL: 54 mg/dL (ref >=46)
LDL Cholesterol: 57 mg/dL (ref 0–99)
Total CHOL/HDL Ratio: 2.3 Ratio
Triglycerides: 61 mg/dL (ref <150)
VLDL: 12 mg/dL (ref 0–40)

## 2014-06-10 LAB — HEMOGLOBIN A1C
Hgb A1c MFr Bld: 5.8 % — ABNORMAL HIGH (ref <5.7)
Mean Plasma Glucose: 120 mg/dL — ABNORMAL HIGH (ref <117)

## 2014-06-10 MED ORDER — ALBUTEROL SULFATE HFA 108 (90 BASE) MCG/ACT IN AERS: 2.0000 | INHALATION_SPRAY | RESPIRATORY_TRACT | Status: DC | PRN

## 2014-06-10 MED ORDER — AMLODIPINE BESYLATE 10 MG PO TABS: 10.0000 mg | ORAL_TABLET | Freq: Every day | ORAL | Status: DC

## 2014-06-10 NOTE — Patient Instructions (Signed)
Continue dexliant Take 2 of the norvasc once a day for blood pressure Continue lexapro at current doser We will call with cholesterol and sugar labs Try 1/2 tablet mrybetriq F/U 3 months

## 2014-06-10 NOTE — Assessment & Plan Note (Signed)
Followed by urology advised to try the Myrebetriq 25mg  to see if she still has HA, she has appt with them next month

## 2014-06-10 NOTE — Assessment & Plan Note (Signed)
Improved with lexapro, continue current dose

## 2014-06-10 NOTE — Assessment & Plan Note (Signed)
Increase to norvasc 10mg  once a day

## 2014-06-10 NOTE — Progress Notes (Signed)
Patient ID: Paula Long, female   DOB: [birth year], 64 y.o.   MRN: [id number]   Subjective:    Patient ID: Paula Long, female    DOB: 09/11/1950, 64 y.o.   MRN: [id number]  Patient presents for 2 week F/U and Back Pain  patient here for interim follow-up on her blood pressure. She states her blood pressures still been running a little high at home 076K to 088P systolic. She has not had any chest pain or shortness of breath.  Reguarding the acid reflux she did well with a Dexilant  She was seen by urology for her bladder. She was given Mirabetriq as well as Vesicare she had a reaction to the [employer] and she states that the Myrebetriq 50 mg gave her a headache  She also woke up today with back pain she's not had any injury she has known chronic back pain which she goes to a pain management specialist for. She has an appointment with them this afternoon     Review Of Systems:  GEN- denies fatigue, fever, weight loss,weakness, recent illness HEENT- denies eye drainage, change in vision, nasal discharge, CVS- denies chest pain, palpitations RESP- denies SOB, cough, wheeze ABD- denies N/V, change in stools, abd pain GU- denies dysuria, hematuria, dribbling, incontinence MSK- + joint pain, muscle aches, injury Neuro- denies headache, dizziness, syncope, seizure activity       Objective:    BP 148/78 mmHg  Pulse 76  Temp(Src) 97.8 F (36.6 C) (Oral)  Resp 14  Ht 5\' 5"  (1.651 m)  Wt 199 lb (90.266 kg)  BMI 33.12 kg/m2 GEN- NAD, alert and oriented x3 HEENT- PERRL, EOMI, non injected sclera, pink conjunctiva, MMM, oropharynx clear Neck- Supple, no thyromegaly CVS- RRR, no murmur RESP-CTAB MSK- TTP left paraspinals, mild TTP lumbar spine,neg SLR, decreased ROM lumbar spine EXT- No edema Pulses- Radial, DP- 2+        Assessment & Plan:      Problem List Items Addressed This Visit      Unprioritized   Prediabetes - Primary   Relevant Orders   Hemoglobin  A1c   Essential hypertension, benign   Relevant Medications   amLODIpine (NORVASC) tablet   Other Relevant Orders   Lipid panel   Depression with anxiety   Chronic back pain      Note: This dictation was prepared with Dragon dictation along with smaller phrase technology. Any transcriptional errors that result from this process are unintentional.

## 2014-06-10 NOTE — Assessment & Plan Note (Signed)
Improved with Dexilant. 

## 2014-06-10 NOTE — Assessment & Plan Note (Signed)
F/U with pain clinic, no red flags

## 2014-06-12 ENCOUNTER — Encounter: Payer: Self-pay | Admitting: *Deleted

## 2014-07-07 ENCOUNTER — Ambulatory Visit: Payer: Medicare HMO | Admitting: Urology

## 2014-07-17 ENCOUNTER — Other Ambulatory Visit: Payer: Self-pay | Admitting: Family Medicine

## 2014-07-21 NOTE — Telephone Encounter (Signed)
Refill appropriate and filled per protocol. 

## 2014-07-29 ENCOUNTER — Other Ambulatory Visit: Payer: Self-pay | Admitting: Family Medicine

## 2014-07-29 NOTE — Telephone Encounter (Signed)
Refill appropriate and filled per protocol. 

## 2014-08-25 ENCOUNTER — Ambulatory Visit (INDEPENDENT_AMBULATORY_CARE_PROVIDER_SITE_OTHER): Payer: Commercial Managed Care - HMO | Admitting: Family Medicine

## 2014-08-25 ENCOUNTER — Encounter: Payer: Self-pay | Admitting: Family Medicine

## 2014-08-25 ENCOUNTER — Telehealth: Payer: Self-pay | Admitting: Family Medicine

## 2014-08-25 VITALS — BP 132/74 | HR 78 | Temp 97.6°F | Resp 14 | Ht 65.0 in | Wt 199.0 lb

## 2014-08-25 DIAGNOSIS — H109 Unspecified conjunctivitis: Secondary | ICD-10-CM

## 2014-08-25 DIAGNOSIS — F418 Other specified anxiety disorders: Secondary | ICD-10-CM | POA: Diagnosis not present

## 2014-08-25 DIAGNOSIS — M549 Dorsalgia, unspecified: Secondary | ICD-10-CM

## 2014-08-25 DIAGNOSIS — I10 Essential (primary) hypertension: Secondary | ICD-10-CM

## 2014-08-25 DIAGNOSIS — N950 Postmenopausal bleeding: Secondary | ICD-10-CM | POA: Diagnosis not present

## 2014-08-25 DIAGNOSIS — A599 Trichomoniasis, unspecified: Secondary | ICD-10-CM

## 2014-08-25 DIAGNOSIS — H00016 Hordeolum externum left eye, unspecified eyelid: Secondary | ICD-10-CM

## 2014-08-25 DIAGNOSIS — G8929 Other chronic pain: Secondary | ICD-10-CM

## 2014-08-25 DIAGNOSIS — E669 Obesity, unspecified: Secondary | ICD-10-CM

## 2014-08-25 DIAGNOSIS — N898 Other specified noninflammatory disorders of vagina: Secondary | ICD-10-CM | POA: Diagnosis not present

## 2014-08-25 DIAGNOSIS — Z1272 Encounter for screening for malignant neoplasm of vagina: Secondary | ICD-10-CM | POA: Diagnosis not present

## 2014-08-25 LAB — WET PREP FOR TRICH, YEAST, CLUE: Yeast Wet Prep HPF POC: NONE SEEN

## 2014-08-25 MED ORDER — ESCITALOPRAM OXALATE 10 MG PO TABS: 10.0000 mg | ORAL_TABLET | Freq: Every day | ORAL | Status: DC

## 2014-08-25 MED ORDER — POLYMYXIN B-TRIMETHOPRIM 10000-0.1 UNIT/ML-% OP SOLN: OPHTHALMIC | Status: DC

## 2014-08-25 MED ORDER — METRONIDAZOLE 500 MG PO TABS: 500.0000 mg | ORAL_TABLET | Freq: Two times a day (BID) | ORAL | Status: DC

## 2014-08-25 NOTE — Assessment & Plan Note (Signed)
Restart lexapro 

## 2014-08-25 NOTE — Patient Instructions (Signed)
Restart lexpapro Referral for pain doctor  We will call with PAP Smear results Eye drop , call if not better than referral to eye doctor F/U 4 months

## 2014-08-25 NOTE — Assessment & Plan Note (Signed)
Well controlled on norvascc

## 2014-08-25 NOTE — Progress Notes (Signed)
Patient ID: Paula Long, female   DOB: Aug 22, 1950, 64 y.o.   MRN: [id number]   Subjective:    Patient ID: Paula Long, female    DOB: May 18, 1950, 64 y.o.   MRN: [id number]  Patient presents for 3 month F/u; Eye Issues; Vaginal Bleeding; and Referral  agent here to follow chronic medical problems. She has multiple concerns today. She has a bump that has been on her left upper eye with for the past couple months. She's also had drainage where she has mattering from her left eye for about 2-3 months as well. There's been no significant change in her vision.  She's had episodes of vaginal bleeding over the past month. The last time she had this was about a year ago. She is status post hysterectomy at age 56. She noticed that after urinating when she wipes. Her urine itself is clear and she has no UTI symptoms.  She requires a new referral to continue with her pain management.  Depression with anxiety at her last visit I started her on Lexapro she was doing wellness medication however her pain management once up with her in Cymbalta therefore they stopped the Lexapro. She did not feel the Cymbalta helped her pain or her mood therefore she discontinued this and was to return to the Lexapro.     Review Of Systems:  GEN- denies fatigue, fever, weight loss,weakness, recent illness HEENT- +eye drainage, dnies change in vision, nasal discharge, CVS- denies chest pain, palpitations RESP- denies SOB, cough, wheeze ABD- denies N/V, change in stools, abd pain GU- denies dysuria, hematuria, dribbling, incontinence MSK- +joint pain, muscle aches, injury Neuro- denies headache, dizziness, syncope, seizure activity       Objective:    BP 132/74 mmHg  Pulse 78  Temp(Src) 97.6 F (36.4 C) (Oral)  Resp 14  Ht 5\' 5"  (1.651 m)  Wt 199 lb (90.266 kg)  BMI 33.12 kg/m2 GEN- NAD, alert and oriented x3 HEENT- PERRL, EOMI, non injected sclera, pink conjunctiva, MMM, oropharynx clear Neck-  Supple, no thyromegaly CVS- RRR, no murmur RESP-CTAB ABD-NABS,soft,NT,ND GU- normal external genitalia, vaginal mucosa pink and atrophic,no gross bleeding until manipulated with brush ,  Ovaries not palpated, uterus/cervix absent Psych- normal affect and mood EXT- No edema Pulses- Radial, DP- 2+        Assessment & Plan:      Problem List Items Addressed This Visit    Obesity - Primary   Essential hypertension, benign    Well controlled on norvascc      Chronic back pain    Referral to continue with pain clinic      Relevant Orders   Ambulatory referral to Pain Clinic    Other Visit Diagnoses    Post-menopausal bleeding        Cultures obtained, will treat for trichomonas, Sample also taken of vaginal cuff    Relevant Orders    PAP, ThinPrep ASCUS Rflx HPV Rflx Type    Conjunctivitis of left eye        Polytrim drop, if not improved send to eye doctor, she wants to hold off at this time    Hordeolum externum, left        Screening for vaginal cancer        Relevant Orders    PAP, ThinPrep ASCUS Rflx HPV Rflx Type    Trichomonas infection        Flagyl x 7 days, partner to be treated as well    Relevant  Medications    metroNIDAZOLE (FLAGYL) 500 MG tablet    Other Relevant Orders    WET PREP FOR [place], YEAST, CLUE (Completed)    GC/Chlamydia Probe Amp       Note: This dictation was prepared with Dragon dictation along with smaller phrase technology. Any transcriptional errors that result from this process are unintentional.

## 2014-08-25 NOTE — Telephone Encounter (Signed)
Return call placed to patient.   Discussed concerns with patient.

## 2014-08-25 NOTE — Telephone Encounter (Signed)
Patient would like to speak to you regarding her visit today if possible  [phone]

## 2014-08-25 NOTE — Assessment & Plan Note (Signed)
Referral to continue with pain clinic

## 2014-08-26 ENCOUNTER — Encounter: Payer: Self-pay | Admitting: *Deleted

## 2014-08-26 ENCOUNTER — Telehealth: Payer: Self-pay | Admitting: Family Medicine

## 2014-08-26 DIAGNOSIS — M542 Cervicalgia: Secondary | ICD-10-CM | POA: Diagnosis not present

## 2014-08-26 DIAGNOSIS — M159 Polyosteoarthritis, unspecified: Secondary | ICD-10-CM | POA: Diagnosis not present

## 2014-08-26 DIAGNOSIS — M545 Low back pain: Secondary | ICD-10-CM | POA: Diagnosis not present

## 2014-08-26 DIAGNOSIS — R252 Cramp and spasm: Secondary | ICD-10-CM | POA: Diagnosis not present

## 2014-08-26 LAB — PAP THINPREP ASCUS RFLX HPV RFLX TYPE

## 2014-08-26 LAB — GC/CHLAMYDIA PROBE AMP
CT Probe RNA: NEGATIVE
GC Probe RNA: NEGATIVE

## 2014-08-26 NOTE — Telephone Encounter (Signed)
Call placed to patient.   Answered questions patient had again.

## 2014-08-26 NOTE — Telephone Encounter (Addendum)
Patient left message on vm requesting speak with you again to finish the discussing her visit [phone]

## 2014-09-09 ENCOUNTER — Telehealth: Payer: Self-pay | Admitting: Family Medicine

## 2014-09-09 NOTE — Telephone Encounter (Signed)
Patient calling to speak to you about something personal  [phone]

## 2014-09-09 NOTE — Telephone Encounter (Signed)
Returned call to patient.   States that spouse confessed to infidelity. States that she has concerns about what else spouse was tested for.   Advised that staff cannot discuss spouse's health with her. Advised that she can come in for office to have labs drawn.

## 2014-09-11 ENCOUNTER — Ambulatory Visit: Payer: Commercial Managed Care - HMO | Admitting: Family Medicine

## 2014-09-23 DIAGNOSIS — M545 Low back pain: Secondary | ICD-10-CM | POA: Diagnosis not present

## 2014-09-23 DIAGNOSIS — Z79899 Other long term (current) drug therapy: Secondary | ICD-10-CM | POA: Diagnosis not present

## 2014-09-23 DIAGNOSIS — M542 Cervicalgia: Secondary | ICD-10-CM | POA: Diagnosis not present

## 2014-09-23 DIAGNOSIS — R252 Cramp and spasm: Secondary | ICD-10-CM | POA: Diagnosis not present

## 2014-10-29 DIAGNOSIS — F17200 Nicotine dependence, unspecified, uncomplicated: Secondary | ICD-10-CM | POA: Diagnosis not present

## 2014-10-29 DIAGNOSIS — H1031 Unspecified acute conjunctivitis, right eye: Secondary | ICD-10-CM | POA: Diagnosis not present

## 2014-10-29 DIAGNOSIS — H5711 Ocular pain, right eye: Secondary | ICD-10-CM | POA: Diagnosis not present

## 2014-10-30 ENCOUNTER — Other Ambulatory Visit: Payer: Self-pay | Admitting: Family Medicine

## 2014-10-30 DIAGNOSIS — Z1231 Encounter for screening mammogram for malignant neoplasm of breast: Secondary | ICD-10-CM

## 2014-11-03 DIAGNOSIS — Z79899 Other long term (current) drug therapy: Secondary | ICD-10-CM | POA: Diagnosis not present

## 2014-11-03 DIAGNOSIS — R252 Cramp and spasm: Secondary | ICD-10-CM | POA: Diagnosis not present

## 2014-11-03 DIAGNOSIS — M545 Low back pain: Secondary | ICD-10-CM | POA: Diagnosis not present

## 2014-11-03 DIAGNOSIS — M542 Cervicalgia: Secondary | ICD-10-CM | POA: Diagnosis not present

## 2014-11-18 ENCOUNTER — Ambulatory Visit (HOSPITAL_COMMUNITY): Payer: Medicare HMO

## 2014-11-25 ENCOUNTER — Ambulatory Visit (HOSPITAL_COMMUNITY)
Admission: RE | Admit: 2014-11-25 | Discharge: 2014-11-25 | Disposition: A | Discharge status: Home / Self Care | Payer: Commercial Managed Care - HMO | Source: Ambulatory Visit | Attending: Family Medicine | Admitting: Family Medicine

## 2014-11-25 DIAGNOSIS — Z1231 Encounter for screening mammogram for malignant neoplasm of breast: Secondary | ICD-10-CM

## 2014-12-02 ENCOUNTER — Telehealth: Payer: Self-pay | Admitting: Family Medicine

## 2014-12-02 NOTE — Telephone Encounter (Signed)
Pt is calling for a referral to her doctor's office.  Please call pt @ [phone]

## 2014-12-04 NOTE — Telephone Encounter (Signed)
lmtrc

## 2014-12-04 NOTE — Telephone Encounter (Signed)
Pt called back stating the doctor she is needing referral thru her insurance is Dr. [name] for [facility], I submitted the referral with pending authorization [id number] authorizated for 6 visits.

## 2014-12-07 ENCOUNTER — Telehealth: Payer: Self-pay | Admitting: *Deleted

## 2014-12-07 NOTE — Telephone Encounter (Signed)
Submitted humana referral thru acuity connect for authorization to Dr. 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 [name] office for records/review

## 2014-12-16 ENCOUNTER — Other Ambulatory Visit: Payer: Self-pay | Admitting: Family Medicine

## 2014-12-25 ENCOUNTER — Encounter: Payer: Self-pay | Admitting: Family Medicine

## 2014-12-25 ENCOUNTER — Ambulatory Visit (INDEPENDENT_AMBULATORY_CARE_PROVIDER_SITE_OTHER): Payer: Commercial Managed Care - HMO | Admitting: Family Medicine

## 2014-12-25 VITALS — BP 134/80 | HR 82 | Temp 97.7°F | Resp 16 | Ht 65.0 in | Wt 202.0 lb

## 2014-12-25 DIAGNOSIS — F418 Other specified anxiety disorders: Secondary | ICD-10-CM | POA: Diagnosis not present

## 2014-12-25 DIAGNOSIS — J418 Mixed simple and mucopurulent chronic bronchitis: Secondary | ICD-10-CM

## 2014-12-25 DIAGNOSIS — R7303 Prediabetes: Secondary | ICD-10-CM

## 2014-12-25 DIAGNOSIS — Z1159 Encounter for screening for other viral diseases: Secondary | ICD-10-CM | POA: Diagnosis not present

## 2014-12-25 DIAGNOSIS — I10 Essential (primary) hypertension: Secondary | ICD-10-CM

## 2014-12-25 DIAGNOSIS — Z720 Tobacco use: Secondary | ICD-10-CM

## 2014-12-25 DIAGNOSIS — Z113 Encounter for screening for infections with a predominantly sexual mode of transmission: Secondary | ICD-10-CM

## 2014-12-25 DIAGNOSIS — Z0000 Encounter for general adult medical examination without abnormal findings: Secondary | ICD-10-CM | POA: Diagnosis not present

## 2014-12-25 DIAGNOSIS — R7309 Other abnormal glucose: Secondary | ICD-10-CM | POA: Diagnosis not present

## 2014-12-25 DIAGNOSIS — Z202 Contact with and (suspected) exposure to infections with a predominantly sexual mode of transmission: Secondary | ICD-10-CM | POA: Diagnosis not present

## 2014-12-25 LAB — CBC WITH DIFFERENTIAL/PLATELET
Basophils Absolute: 0.1 10*3/uL (ref 0.0–0.1)
Basophils Relative: 1 % (ref 0–1)
Eosinophils Absolute: 0.1 10*3/uL (ref 0.0–0.7)
Eosinophils Relative: 1 % (ref 0–5)
HCT: 42.6 % (ref 36.0–46.0)
Hemoglobin: 14.6 g/dL (ref 12.0–15.0)
Lymphocytes Relative: 26 % (ref 12–46)
Lymphs Abs: 2.1 10*3/uL (ref 0.7–4.0)
MCH: 31.7 pg (ref 26.0–34.0)
MCHC: 34.3 g/dL (ref 30.0–36.0)
MCV: 92.6 fL (ref 78.0–100.0)
MPV: 10.2 fL (ref 8.6–12.4)
Monocytes Absolute: 0.6 10*3/uL (ref 0.1–1.0)
Monocytes Relative: 7 % (ref 3–12)
Neutro Abs: 5.1 10*3/uL (ref 1.7–7.7)
Neutrophils Relative %: 65 % (ref 43–77)
Platelets: 292 10*3/uL (ref 150–400)
RBC: 4.6 MIL/uL (ref 3.87–5.11)
RDW: 14.5 % (ref 11.5–15.5)
WBC: 7.9 10*3/uL (ref 4.0–10.5)

## 2014-12-25 LAB — COMPREHENSIVE METABOLIC PANEL
ALT: 8 U/L (ref 6–29)
AST: 12 U/L (ref 10–35)
Albumin: 4.1 g/dL (ref 3.6–5.1)
Alkaline Phosphatase: 78 U/L (ref 33–130)
BUN: 17 mg/dL (ref 7–25)
CO2: 29 mmol/L (ref 20–31)
Calcium: 9 mg/dL (ref 8.6–10.4)
Chloride: 98 mmol/L (ref 98–110)
Creat: 1.28 mg/dL — ABNORMAL HIGH (ref 0.50–0.99)
Glucose, Bld: 88 mg/dL (ref 70–99)
Potassium: 4.6 mmol/L (ref 3.5–5.3)
Sodium: 136 mmol/L (ref 135–146)
Total Bilirubin: 0.2 mg/dL (ref 0.2–1.2)
Total Protein: 7.1 g/dL (ref 6.1–8.1)

## 2014-12-25 LAB — WET PREP FOR TRICH, YEAST, CLUE
Clue Cells Wet Prep HPF POC: NONE SEEN
Trich, Wet Prep: NONE SEEN
Yeast Wet Prep HPF POC: NONE SEEN

## 2014-12-25 LAB — LIPID PANEL
Cholesterol: 132 mg/dL (ref 125–200)
HDL: 51 mg/dL (ref >=46)
LDL Cholesterol: 63 mg/dL (ref <130)
Total CHOL/HDL Ratio: 2.6 Ratio (ref <=5.0)
Triglycerides: 92 mg/dL (ref <150)
VLDL: 18 mg/dL (ref <30)

## 2014-12-25 LAB — HEMOGLOBIN A1C
Hgb A1c MFr Bld: 6.1 % — ABNORMAL HIGH (ref <5.7)
Mean Plasma Glucose: 128 mg/dL — ABNORMAL HIGH (ref <117)

## 2014-12-25 MED ORDER — ESCITALOPRAM OXALATE 20 MG PO TABS: 20.0000 mg | ORAL_TABLET | Freq: Every day | ORAL | Status: DC

## 2014-12-25 MED ORDER — OXYMORPHONE HCL ER 15 MG PO T12A: EXTENDED_RELEASE_TABLET | ORAL | Status: DC

## 2014-12-25 NOTE — Assessment & Plan Note (Signed)
Well controlled 

## 2014-12-25 NOTE — Progress Notes (Signed)
Patient ID: Paula Long, female   DOB: [birth year], 64 y.o.   MRN: [id number] Subjective:   Patient presents for Medicare Annual/Subsequent preventive examination.  Patient here for a all wellness exam. She continues to suffer with low back pain she is in a pain clinic. Preoperative from her husband will like complete STD screening today. She was treated for Trichomonas back in the summer. She continues to smoke. On the present screening she states that she has been more stress lately and feeling depressed with everything going on with her husband. She is currently on Lexapro 10 mg. She denies any suicidal or homicidal ideations. Review Past Medical/Family/Social: Per EMR   Risk Factors  Current exercise habits: None Dietary issues discussed: Yes  Cardiac risk factors: Obesity (BMI >= 30 kg/m2). HTN, borderline DM  Depression Screen  (Note: if answer to either of the following is "Yes", a more complete depression screening is indicated)  Over the past two weeks, have you felt down, depressed or hopeless? yes Over the past two weeks, have you felt little interest or pleasure in doing things? No Have you lost interest or pleasure in daily life? No Do you often feel hopeless? No Do you cry easily over simple problems? yes  Activities of Daily Living  In your present state of health, do you have any difficulty performing the following activities?:  Driving? No  Managing money? No  Feeding yourself? No  Getting from bed to chair? No  Climbing a flight of stairs? yes Preparing food and eating?: No  Bathing or showering? No  Getting dressed: No  Getting to the toilet? No  Using the toilet:No  Moving around from place to place: yes  In the past year have you fallen or had a near fall?:yes  Are you sexually active? yes  Do you have more than one partner? No   Hearing Difficulties: No  Do you often ask people to speak up or repeat themselves? No  Do you experience ringing or  noises in your ears? No Do you have difficulty understanding soft or whispered voices? No  Do you feel that you have a problem with memory? No Do you often misplace items? No  Do you feel safe at home? Yes  Cognitive Testing  Alert? Yes Normal Appearance?Yes  Oriented to person? Yes Place? Yes  Time? Yes  Recall of three objects? Yes  Can perform simple calculations? Yes  Displays appropriate judgment?Yes  Can read the correct time from a watch face?Yes   List the Names of Other Physician/Practitioners you currently use:   Dr. [name]- Neurology/pain clinic    Screening Tests / Date Colonoscopy    UTD                 Zostavax age 89 Mammogram  UTD Influenza Vaccine - Declines  Tetanus/tdap - believes 2010  ROS: GEN- denies fatigue, fever, weight loss,weakness, recent illness HEENT- denies eye drainage, change in vision, nasal discharge, CVS- denies chest pain, palpitations RESP- denies SOB, cough, wheeze ABD- denies N/V, change in stools, abd pain GU- denies dysuria, hematuria, dribbling, incontinence MSK- denies joint pain, muscle aches, injury Neuro- denies headache, dizziness, syncope, seizure activity  PHYSICAL: GEN- NAD, alert and oriented x3 HEENT- PERRL, EOMI, non injected sclera, pink conjunctiva, MMM, oropharynx clear Neck- Supple, no thryomegaly CVS- RRR, no murmur RESP-CTAB ABD-NABS,soft,ND,NT GU- normal external genitalia, vaginal mucosa pink and atrophic,no gross blood, minimal discharge ,  Ovaries not palpated, uterus/cervix absent Psych- normal affect and mood  EXT- No edema Pulses- Radial, DP- 2+    Assessment:    Annual wellness medicare exam   Plan:    During the course of the visit the patient was educated and counseled about appropriate screening and preventive services including:  Declines Flu shot  Current treatment for depression- increase to Lexapro 20mg  Discussed healthy eating, even if she cant exercise  Discussed tobacco cessation   _  Patient Instructions (the written plan) was given to the patient.  Medicare Attestation  I have personally reviewed:  The patient's medical and social history  Their use of alcohol, tobacco or illicit drugs  Their current medications and supplements  The patient's functional ability including ADLs,fall risks, home safety risks, cognitive, and hearing and visual impairment  Diet and physical activities  Evidence for depression or mood disorders  The patient's weight, height, BMI, and visual acuity have been recorded in the chart. I have made referrals, counseling, and provided education to the patient based on review of the above and I have provided the patient with a written personalized care plan for preventive services.

## 2014-12-25 NOTE — Assessment & Plan Note (Signed)
Counseled on cessation 

## 2014-12-25 NOTE — Assessment & Plan Note (Signed)
Increase Lexapro to 20 mg.

## 2014-12-25 NOTE — Patient Instructions (Signed)
Lexapro increased to 20mg  once a day  We will call with lab results Work on diet and weight loss  F/U 6 months

## 2014-12-26 LAB — GC/CHLAMYDIA PROBE AMP
CT Probe RNA: NEGATIVE
GC Probe RNA: NEGATIVE

## 2014-12-26 LAB — HEPATITIS C ANTIBODY: HCV Ab: NEGATIVE

## 2014-12-26 LAB — HIV ANTIBODY (ROUTINE TESTING W REFLEX): HIV 1&2 Ab, 4th Generation: NONREACTIVE

## 2014-12-26 LAB — RPR

## 2014-12-31 DIAGNOSIS — Z79899 Other long term (current) drug therapy: Secondary | ICD-10-CM | POA: Diagnosis not present

## 2014-12-31 DIAGNOSIS — R413 Other amnesia: Secondary | ICD-10-CM | POA: Diagnosis not present

## 2014-12-31 DIAGNOSIS — M545 Low back pain: Secondary | ICD-10-CM | POA: Diagnosis not present

## 2014-12-31 DIAGNOSIS — M542 Cervicalgia: Secondary | ICD-10-CM | POA: Diagnosis not present

## 2014-12-31 DIAGNOSIS — R252 Cramp and spasm: Secondary | ICD-10-CM | POA: Diagnosis not present

## 2015-02-10 ENCOUNTER — Other Ambulatory Visit: Payer: Self-pay | Admitting: Family Medicine

## 2015-02-10 NOTE — Telephone Encounter (Signed)
Refill appropriate and filled per protocol.l 

## 2015-02-15 ENCOUNTER — Other Ambulatory Visit: Payer: Self-pay | Admitting: Family Medicine

## 2015-02-16 NOTE — Telephone Encounter (Signed)
Medication refilled per protocol. 

## 2015-03-01 ENCOUNTER — Other Ambulatory Visit: Payer: Self-pay | Admitting: Family Medicine

## 2015-03-01 NOTE — Telephone Encounter (Signed)
Refill appropriate and filled per protocol. 

## 2015-03-02 DIAGNOSIS — G479 Sleep disorder, unspecified: Secondary | ICD-10-CM | POA: Diagnosis not present

## 2015-03-02 DIAGNOSIS — R252 Cramp and spasm: Secondary | ICD-10-CM | POA: Diagnosis not present

## 2015-03-02 DIAGNOSIS — K219 Gastro-esophageal reflux disease without esophagitis: Secondary | ICD-10-CM | POA: Diagnosis not present

## 2015-03-02 DIAGNOSIS — M542 Cervicalgia: Secondary | ICD-10-CM | POA: Diagnosis not present

## 2015-03-02 DIAGNOSIS — M545 Low back pain: Secondary | ICD-10-CM | POA: Diagnosis not present

## 2015-03-02 DIAGNOSIS — J449 Chronic obstructive pulmonary disease, unspecified: Secondary | ICD-10-CM | POA: Diagnosis not present

## 2015-03-02 DIAGNOSIS — I10 Essential (primary) hypertension: Secondary | ICD-10-CM | POA: Diagnosis not present

## 2015-03-02 DIAGNOSIS — Z79899 Other long term (current) drug therapy: Secondary | ICD-10-CM | POA: Diagnosis not present

## 2015-03-05 ENCOUNTER — Encounter: Payer: Self-pay | Admitting: Family Medicine

## 2015-03-05 ENCOUNTER — Ambulatory Visit: Payer: Commercial Managed Care - HMO | Admitting: Family Medicine

## 2015-03-05 ENCOUNTER — Ambulatory Visit (INDEPENDENT_AMBULATORY_CARE_PROVIDER_SITE_OTHER): Payer: Commercial Managed Care - HMO | Admitting: Family Medicine

## 2015-03-05 VITALS — BP 138/86 | HR 78 | Temp 97.7°F | Resp 16 | Ht 65.0 in | Wt 211.0 lb

## 2015-03-05 DIAGNOSIS — J441 Chronic obstructive pulmonary disease with (acute) exacerbation: Secondary | ICD-10-CM

## 2015-03-05 DIAGNOSIS — B370 Candidal stomatitis: Secondary | ICD-10-CM

## 2015-03-05 MED ORDER — METHYLPREDNISOLONE ACETATE 80 MG/ML IJ SUSP
80.0000 mg | Freq: Once | INTRAMUSCULAR | Status: AC
  Administered 2015-03-05: 80 mg via INTRAMUSCULAR

## 2015-03-05 MED ORDER — NYSTATIN 100000 UNIT/ML MT SUSP: 5.0000 mL | Freq: Four times a day (QID) | OROMUCOSAL | Status: DC

## 2015-03-05 MED ORDER — PREDNISONE 10 MG PO TABS: ORAL_TABLET | ORAL | Status: DC

## 2015-03-05 MED ORDER — IPRATROPIUM-ALBUTEROL 0.5-2.5 (3) MG/3ML IN SOLN
3.0000 mL | Freq: Once | RESPIRATORY_TRACT | Status: AC
  Administered 2015-03-05: 3 mL via RESPIRATORY_TRACT

## 2015-03-05 MED ORDER — DOXYCYCLINE HYCLATE 100 MG PO TABS: 100.0000 mg | ORAL_TABLET | Freq: Two times a day (BID) | ORAL | Status: DC

## 2015-03-05 MED ORDER — ALBUTEROL SULFATE (2.5 MG/3ML) 0.083% IN NEBU: 2.5000 mg | INHALATION_SOLUTION | RESPIRATORY_TRACT | Status: DC | PRN

## 2015-03-05 NOTE — Progress Notes (Signed)
Patient ID: TYRONZA KITTELL, female   DOB: [birth year], 64 y.o.   MRN: [id number]   Subjective:    Patient ID: NITZA BACIK, female    DOB: Sep 03, 1950, 64 y.o.   MRN: [id number]  Patient presents for Illness here with cough with production wheezing shortness of breath worse at nighttime. She has underlying COPD. She's been using her nebulizer with minimal improvement. No fever. No known sick contacts. She continues to smoke 1 pack per day. She's also had some mild sinus drainage.    Review Of Systems:  GEN- denies fatigue, fever, weight loss,weakness, recent illness HEENT- denies eye drainage, change in vision, +nasal discharge, CVS- denies chest pain, palpitations RESP- + SOB, +cough,+ wheeze ABD- denies N/V, change in stools, abd pain GU- denies dysuria, hematuria, dribbling, incontinence MSK- denies joint pain, muscle aches, injury Neuro- denies headache, dizziness, syncope, seizure activity       Objective:    BP 138/86 mmHg  Pulse 78  Temp(Src) 97.7 F (36.5 C) (Oral)  Resp 16  Ht 5\' 5"  (1.651 m)  Wt 211 lb (95.709 kg)  BMI 35.11 kg/m2  SpO2 97% GEN- NAD, alert and oriented x3 HEENT- PERRL, EOMI, non injected sclera, pink conjunctiva, MMM, oropharynx clear, mild thrush on tongue, TM clear bilat no effusion,  No  maxillary sinus tenderness, +  Nasal drainage  Neck- Supple, no LAD CVS- RRR, no murmur RESP- bilat wheeze, decreased air movement, +rhonci bilat, harsh cough, sat 97% EXT- No edema Pulses- Radial 2+          Assessment & Plan:      Problem List Items Addressed This Visit    None    Visit Diagnoses    COPD exacerbation ([place])    -  Primary    S/P neb improved bronchospasm, good air movement, Given Depo Medrol 80mg  IM, Duoneb, start prednisoen taper, antibiotics,continue nebs    Relevant Medications    predniSONE (DELTASONE) 10 MG tablet    albuterol (PROVENTIL) (2.5 MG/3ML) 0.083% nebulizer solution    Thrush        Mild thrush will  worsen with prednisone, given nystatin rinse    Relevant Medications    nystatin (MYCOSTATIN) 100000 UNIT/ML suspension       Note: This dictation was prepared with Dragon dictation along with smaller phrase technology. Any transcriptional errors that result from this process are unintentional.

## 2015-03-05 NOTE — Patient Instructions (Addendum)
Take antibiotics Start prednisone tomorrow Use nebulizer every 4 hours Use Mucinex DM Nystatin for mouth F/U as previous

## 2015-03-08 ENCOUNTER — Encounter: Payer: Self-pay | Admitting: Family Medicine

## 2015-03-08 ENCOUNTER — Telehealth: Payer: Self-pay | Admitting: Family Medicine

## 2015-03-08 NOTE — Telephone Encounter (Signed)
Pt is calling to see if we can call her in something for her cough to [place].  [phone]

## 2015-03-09 MED ORDER — BENZONATATE 200 MG PO CAPS: 200.0000 mg | ORAL_CAPSULE | Freq: Two times a day (BID) | ORAL | Status: DC | PRN

## 2015-03-09 NOTE — Telephone Encounter (Signed)
She is on chronic pain meds, she can take [facility] DM and the tessalon, nothing else can be added

## 2015-03-09 NOTE — Telephone Encounter (Signed)
Call placed to patient and patient made aware.  

## 2015-03-09 NOTE — Telephone Encounter (Signed)
Call placed to patient.   Reports that she continues to have cough. She is taking prednisone taper and ABTx as prescribed. Has tried Mucinex and Delsym without relief.   Reports that she had some Tessalon [place], but they were ineffective as well.   MD please advise.

## 2015-03-18 ENCOUNTER — Telehealth: Payer: Self-pay | Admitting: Family Medicine

## 2015-03-18 ENCOUNTER — Other Ambulatory Visit: Payer: Self-pay | Admitting: *Deleted

## 2015-03-18 ENCOUNTER — Ambulatory Visit (HOSPITAL_COMMUNITY)
Admission: RE | Admit: 2015-03-18 | Discharge: 2015-03-18 | Disposition: A | Discharge status: Home / Self Care | Payer: Commercial Managed Care - HMO | Source: Ambulatory Visit | Attending: Family Medicine | Admitting: Family Medicine

## 2015-03-18 DIAGNOSIS — R059 Cough, unspecified: Secondary | ICD-10-CM

## 2015-03-18 DIAGNOSIS — R918 Other nonspecific abnormal finding of lung field: Secondary | ICD-10-CM | POA: Insufficient documentation

## 2015-03-18 DIAGNOSIS — R05 Cough: Secondary | ICD-10-CM | POA: Diagnosis not present

## 2015-03-18 DIAGNOSIS — R0989 Other specified symptoms and signs involving the circulatory and respiratory systems: Secondary | ICD-10-CM | POA: Diagnosis not present

## 2015-03-18 MED ORDER — LEVOFLOXACIN 500 MG PO TABS: 500.0000 mg | ORAL_TABLET | Freq: Every day | ORAL | Status: DC

## 2015-03-18 NOTE — Telephone Encounter (Signed)
Patient is still congested and the med that was prescribed for her is not working would like more antibiotic if possible  [place]

## 2015-03-18 NOTE — Telephone Encounter (Signed)
Patient seen on 03/05/2015 for COPD exacerbation and has completed ABTx/ prednisone.   MD please advise.

## 2015-03-18 NOTE — Telephone Encounter (Signed)
Patient returned call and made aware.

## 2015-03-18 NOTE — Telephone Encounter (Signed)
Send for CXR first- [facility]

## 2015-03-18 NOTE — Telephone Encounter (Signed)
CXR ordered.   Call placed to patient. LMTRC.  

## 2015-03-26 ENCOUNTER — Telehealth: Payer: Self-pay | Admitting: Family Medicine

## 2015-03-26 ENCOUNTER — Emergency Department (HOSPITAL_COMMUNITY)
Admission: EM | Admit: 2015-03-26 | Discharge: 2015-03-26 | Discharge status: Against Medical Advice | Payer: Commercial Managed Care - HMO | Attending: Emergency Medicine | Admitting: Emergency Medicine

## 2015-03-26 ENCOUNTER — Encounter (HOSPITAL_COMMUNITY): Payer: Self-pay | Admitting: Cardiology

## 2015-03-26 ENCOUNTER — Emergency Department (HOSPITAL_COMMUNITY): Payer: Commercial Managed Care - HMO

## 2015-03-26 DIAGNOSIS — Z85828 Personal history of other malignant neoplasm of skin: Secondary | ICD-10-CM | POA: Insufficient documentation

## 2015-03-26 DIAGNOSIS — R0602 Shortness of breath: Secondary | ICD-10-CM | POA: Diagnosis present

## 2015-03-26 DIAGNOSIS — J069 Acute upper respiratory infection, unspecified: Secondary | ICD-10-CM | POA: Diagnosis not present

## 2015-03-26 DIAGNOSIS — G8929 Other chronic pain: Secondary | ICD-10-CM | POA: Insufficient documentation

## 2015-03-26 DIAGNOSIS — Z136 Encounter for screening for cardiovascular disorders: Secondary | ICD-10-CM | POA: Insufficient documentation

## 2015-03-26 DIAGNOSIS — R05 Cough: Secondary | ICD-10-CM | POA: Diagnosis not present

## 2015-03-26 DIAGNOSIS — F17210 Nicotine dependence, cigarettes, uncomplicated: Secondary | ICD-10-CM | POA: Diagnosis not present

## 2015-03-26 DIAGNOSIS — Z79899 Other long term (current) drug therapy: Secondary | ICD-10-CM | POA: Diagnosis not present

## 2015-03-26 DIAGNOSIS — M797 Fibromyalgia: Secondary | ICD-10-CM | POA: Diagnosis not present

## 2015-03-26 DIAGNOSIS — I10 Essential (primary) hypertension: Secondary | ICD-10-CM | POA: Diagnosis not present

## 2015-03-26 DIAGNOSIS — K219 Gastro-esophageal reflux disease without esophagitis: Secondary | ICD-10-CM | POA: Insufficient documentation

## 2015-03-26 DIAGNOSIS — F419 Anxiety disorder, unspecified: Secondary | ICD-10-CM | POA: Insufficient documentation

## 2015-03-26 DIAGNOSIS — J441 Chronic obstructive pulmonary disease with (acute) exacerbation: Secondary | ICD-10-CM | POA: Insufficient documentation

## 2015-03-26 MED ORDER — IPRATROPIUM-ALBUTEROL 0.5-2.5 (3) MG/3ML IN SOLN: 3.0000 mL | Freq: Once | RESPIRATORY_TRACT | Status: DC

## 2015-03-26 MED ORDER — PREDNISONE 20 MG PO TABS
40.0000 mg | ORAL_TABLET | Freq: Once | ORAL | Status: AC
  Administered 2015-03-26: 40 mg via ORAL
  Filled 2015-03-26: qty 2

## 2015-03-26 NOTE — ED Notes (Signed)
Sob,  Cough times 2 weeks.  Seen Dr. Buelah Manis diagnosed with pneumonia,  finished antibiodic yesterday.  Pt states she is a little better.

## 2015-03-26 NOTE — ED Notes (Signed)
Pt witnessed walking out of dept. Steady gait.

## 2015-03-26 NOTE — Telephone Encounter (Signed)
Patient has been seen and given ABTx for CXR showing infiltrates.   MD made aware and advised that she needs to be seen today. Advised to go to [facility].

## 2015-03-26 NOTE — ED Provider Notes (Signed)
CSN: [id number]     Arrival date & time 03/26/15  1345 History   First MD Initiated Contact with Patient 03/26/15 1538     Chief Complaint  Patient presents with  . Shortness of Breath      Patient is a 65 y.o. female presenting with shortness of breath. The history is provided by the patient.  Shortness of Breath Associated symptoms: cough   Associated symptoms: no abdominal pain, no chest pain, no headaches, no rash and no vomiting    patient's had shortness of breath and cough the last 2 weeks. Has a history of COPD. She was seen by primary care doctor and finished up her Levaquin today. Reported positive x-ray. States she's doing a little bit better but not taking: Better. No fevers. He's had some sputum but states there is not as much better she feels that she needs to bring out. No hemoptysis. She is current everyday smoker. No swelling or legs.  Past Medical History  Diagnosis Date  . GERD (gastroesophageal reflux disease)   . Chronic back pain   . Normal cardiac stress test 06/2009  . Hypertension 12-07-11    presently no meds  . COPD (chronic obstructive pulmonary disease) ([place]) 12-07-11    pt. uses nebulizer as needed and inhaler daily  . Shortness of breath 12-07-11    less now, occ wheezes  . Arthritis 12-07-11    arthritis,DDD,spinal stenosis  . Hemorrhoids   . Skin cancer     melanoma.  face 2014  . Anxiety   . Fibromyalgia     skin cancer   Past Surgical History  Procedure Laterality Date  . Abdominal hysterectomy    . Left ankle surgery      tendonitis  . Right wrist surgery for pinched nerve    . Trigger finger surgery  12-07-11    rt. middle trigger finger release  . Lumbar laminectomy/decompression microdiscectomy  12/11/2011    Procedure: LUMBAR LAMINECTOMY/DECOMPRESSION MICRODISCECTOMY;  Surgeon: Tobi Bastos, MD;  Location: [facility];  Service: Orthopedics;  Laterality: Left;  Decompressive Lumbar Laminectomy L5-S1 on Left  . Back surgery  12-07-11    2'10  L5 x2  . Colonoscopy with propofol N/A 06/03/2014    Procedure: COLONOSCOPY WITH PROPOFOL; IN CECUM AT 0750; TOTAL WITHDRAWAL TIME 16 MINUTES;  Surgeon: Rogene Houston, MD;  Location: [facility];  Service: Endoscopy;  Laterality: N/A;  . Polypectomy N/A 06/03/2014    Procedure: POLYPECTOMY;  Surgeon: Rogene Houston, MD;  Location: [facility];  Service: Endoscopy;  Laterality: N/A;   Family History  Problem Relation Age of Onset  . Cancer Mother     uterine  . Cancer Father     lung   Social History  Substance Use Topics  . Smoking status: Current Every Day Smoker -- 2.00 packs/day for 48 years    Types: Cigarettes  . Smokeless tobacco: Never Used     Comment: since age 34. smoking 2-3 packs a day  . Alcohol Use: No   OB History    No data available     Review of Systems  Constitutional: Negative for activity change, appetite change and fatigue.  Eyes: Negative for pain.  Respiratory: Positive for cough and shortness of breath. Negative for chest tightness.   Cardiovascular: Negative for chest pain and leg swelling.  Gastrointestinal: Negative for nausea, vomiting, abdominal pain and diarrhea.  Genitourinary: Negative for flank pain.  Musculoskeletal: Negative for back pain and neck stiffness.  Skin:  Negative for rash.  Neurological: Negative for weakness, numbness and headaches.  Psychiatric/Behavioral: Negative for behavioral problems.      Allergies  Sulfa antibiotics; Nystatin; Percocet; Vesicare; and Vicodin  Home Medications   Prior to Admission medications   Medication Sig Start Date End Date Taking? Authorizing Provider  albuterol (PROVENTIL) (2.5 MG/3ML) 0.083% nebulizer solution Take 3 mLs (2.5 mg total) by nebulization every 4 (four) hours as needed for wheezing or shortness of breath. 03/05/15  Yes Alycia Rossetti, MD  amLODipine (NORVASC) 10 MG tablet Take 1 tablet (10 mg total) by mouth daily. 06/10/14  Yes Alycia Rossetti, MD  dexlansoprazole (DEXILANT) 60 MG  capsule Take 1 capsule (60 mg total) by mouth daily. 05/26/14  Yes Alycia Rossetti, MD  escitalopram (LEXAPRO) 20 MG tablet Take 1 tablet (20 mg total) by mouth daily. 12/25/14  Yes Alycia Rossetti, MD  gabapentin (NEURONTIN) 400 MG capsule Take 400 mg by mouth 2 (two) times daily.   Yes Historical Provider, MD  Oxymorphone ER, Crush Resist, (OPANA ER, CRUSH RESISTANT,) 15 MG T12A Twice a dayjscript:void(0) Patient taking differently: Take 15 mg by mouth 2 (two) times daily.  12/25/14  Yes Alycia Rossetti, MD  traZODone (DESYREL) 150 MG tablet TAKE 1 TABLET AT BEDTIME 07/29/14  Yes Alycia Rossetti, MD  VENTOLIN HFA 108 (90 BASE) MCG/ACT inhaler INHALE 2 PUFFS EVERY 4 HOURS AS NEEDED FOR WHEEZING OR SHORTNESS OF BREATH 02/16/15  Yes Alycia Rossetti, MD  benzonatate (TESSALON) 200 MG capsule Take 1 capsule (200 mg total) by mouth 2 (two) times daily as needed for cough. Patient not taking: Reported on 03/26/2015 03/09/15   Alycia Rossetti, MD  doxycycline (VIBRA-TABS) 100 MG tablet Take 1 tablet (100 mg total) by mouth 2 (two) times daily. Patient not taking: Reported on 03/26/2015 03/05/15   Alycia Rossetti, MD  levofloxacin (LEVAQUIN) 500 MG tablet Take 1 tablet (500 mg total) by mouth daily. Patient not taking: Reported on 03/26/2015 03/18/15   Alycia Rossetti, MD  nystatin (MYCOSTATIN) 100000 UNIT/ML suspension Take 5 mLs (500,000 Units total) by mouth 4 (four) times daily. Patient not taking: Reported on 03/26/2015 03/05/15   Alycia Rossetti, MD  nystatin (MYCOSTATIN) powder APPLY  TOPICALLY TWICE DAILY AS NEEDED Patient not taking: Reported on 03/26/2015 03/01/15   Alycia Rossetti, MD  predniSONE (DELTASONE) 10 MG tablet Take 40mg  x 3 days,20mg  x 3 days, 10mg  x 3 days Patient not taking: Reported on 03/26/2015 03/05/15   Alycia Rossetti, MD   BP 139/73 mmHg  Pulse 72  Temp(Src) 97.7 F (36.5 C) (Oral)  Resp 15  SpO2 97% Physical Exam  Constitutional: She is oriented to person, place, and  time. She appears well-developed and well-nourished.  HENT:  Head: Normocephalic and atraumatic.  Cardiovascular: Normal rate, regular rhythm and normal heart sounds.   No murmur heard. Pulmonary/Chest: Effort normal. She has wheezes. She has no rales.  Mild ex for wheezes and prolonged expirations.  Abdominal: Soft. There is no tenderness.  Musculoskeletal: Normal range of motion.  Neurological: She is alert and oriented to person, place, and time. No cranial nerve deficit.  Skin: Skin is warm and dry.  Psychiatric: Her speech is normal.  Nursing note and vitals reviewed.   ED Course  Procedures (including critical care time) Labs Review Labs Reviewed - No data to display  Imaging Review Dg Chest 2 View  03/26/2015  CLINICAL DATA:  Cough for 2 weeks EXAM:  CHEST  2 VIEW COMPARISON:  03/18/2015 FINDINGS: Normal mediastinum and cardiac silhouette. Normal pulmonary vasculature. No evidence of effusion, infiltrate, or pneumothorax. No acute bony abnormality. IMPRESSION: No acute cardiopulmonary process. Electronically Signed   By: Suzy Bouchard M.D.   On: 03/26/2015 14:33   I have personally reviewed and evaluated these images and lab results as part of my medical decision-making.   EKG Interpretation None      MDM   Final diagnoses:  URI (upper respiratory infection)   patient was shortness of breath and cough. Has had for last 2 weeks. Has been on antibiotics. Some wheezing on exam. I was waiting on pulse ox of ambulation when patient eloped from the ER. X-ray was reassuring with no infiltrate. Given sterile it's here.    Davonna Belling, MD 03/26/15 [phone]

## 2015-03-26 NOTE — Telephone Encounter (Signed)
Patient calling to let you know she has finished her antibiotic, but is still wheezing and has a bad cough  [phone]

## 2015-04-12 ENCOUNTER — Other Ambulatory Visit: Payer: Self-pay | Admitting: Family Medicine

## 2015-04-12 NOTE — Telephone Encounter (Signed)
Refill appropriate and filled per protocol. 

## 2015-04-20 ENCOUNTER — Telehealth: Payer: Self-pay | Admitting: Family Medicine

## 2015-04-20 MED ORDER — ALBUTEROL SULFATE HFA 108 (90 BASE) MCG/ACT IN AERS: INHALATION_SPRAY | RESPIRATORY_TRACT | Status: DC

## 2015-04-20 NOTE — Telephone Encounter (Signed)
Patient would like a prescription called into [facility].

## 2015-04-20 NOTE — Telephone Encounter (Signed)
Prescription sent to pharmacy for Ventolin.

## 2015-04-23 DIAGNOSIS — J449 Chronic obstructive pulmonary disease, unspecified: Secondary | ICD-10-CM | POA: Diagnosis not present

## 2015-04-23 DIAGNOSIS — Z79899 Other long term (current) drug therapy: Secondary | ICD-10-CM | POA: Diagnosis not present

## 2015-04-23 DIAGNOSIS — G479 Sleep disorder, unspecified: Secondary | ICD-10-CM | POA: Diagnosis not present

## 2015-04-23 DIAGNOSIS — M542 Cervicalgia: Secondary | ICD-10-CM | POA: Diagnosis not present

## 2015-04-23 DIAGNOSIS — R252 Cramp and spasm: Secondary | ICD-10-CM | POA: Diagnosis not present

## 2015-04-23 DIAGNOSIS — I10 Essential (primary) hypertension: Secondary | ICD-10-CM | POA: Diagnosis not present

## 2015-04-23 DIAGNOSIS — M545 Low back pain: Secondary | ICD-10-CM | POA: Diagnosis not present

## 2015-04-23 DIAGNOSIS — K219 Gastro-esophageal reflux disease without esophagitis: Secondary | ICD-10-CM | POA: Diagnosis not present

## 2015-04-27 DIAGNOSIS — R5383 Other fatigue: Secondary | ICD-10-CM | POA: Diagnosis not present

## 2015-04-27 DIAGNOSIS — Z79899 Other long term (current) drug therapy: Secondary | ICD-10-CM | POA: Diagnosis not present

## 2015-04-27 DIAGNOSIS — E538 Deficiency of other specified B group vitamins: Secondary | ICD-10-CM | POA: Diagnosis not present

## 2015-04-27 DIAGNOSIS — M818 Other osteoporosis without current pathological fracture: Secondary | ICD-10-CM | POA: Diagnosis not present

## 2015-04-27 DIAGNOSIS — E559 Vitamin D deficiency, unspecified: Secondary | ICD-10-CM | POA: Diagnosis not present

## 2015-06-03 ENCOUNTER — Other Ambulatory Visit: Payer: Self-pay | Admitting: Family Medicine

## 2015-06-03 NOTE — Telephone Encounter (Signed)
Test strips refilled

## 2015-06-25 ENCOUNTER — Telehealth: Payer: Self-pay | Admitting: *Deleted

## 2015-06-25 DIAGNOSIS — G4733 Obstructive sleep apnea (adult) (pediatric): Secondary | ICD-10-CM | POA: Diagnosis not present

## 2015-06-25 DIAGNOSIS — Z79899 Other long term (current) drug therapy: Secondary | ICD-10-CM | POA: Diagnosis not present

## 2015-06-25 DIAGNOSIS — M461 Sacroiliitis, not elsewhere classified: Secondary | ICD-10-CM | POA: Diagnosis not present

## 2015-06-25 DIAGNOSIS — I10 Essential (primary) hypertension: Secondary | ICD-10-CM | POA: Diagnosis not present

## 2015-06-25 DIAGNOSIS — M545 Low back pain: Secondary | ICD-10-CM | POA: Diagnosis not present

## 2015-06-25 DIAGNOSIS — M542 Cervicalgia: Secondary | ICD-10-CM | POA: Diagnosis not present

## 2015-06-25 DIAGNOSIS — M5416 Radiculopathy, lumbar region: Secondary | ICD-10-CM | POA: Diagnosis not present

## 2015-06-25 NOTE — Telephone Encounter (Signed)
Submitted humana referral thru acuity connect for authorization to Dr. Trey Sailors [place],MD with authorization [phone]  Requesting provider: Neysa Hotter  Treating provider: Audley Hose  Number of visits:6  Start Date: 06/25/15  End Date:12/22/15  Dx:M54.5- Low back pain      R25.2- Cramp and spasm      R41.3- Other amnesia

## 2015-06-28 ENCOUNTER — Encounter: Payer: Self-pay | Admitting: Family Medicine

## 2015-06-28 ENCOUNTER — Ambulatory Visit (INDEPENDENT_AMBULATORY_CARE_PROVIDER_SITE_OTHER): Payer: Commercial Managed Care - HMO | Admitting: Family Medicine

## 2015-06-28 VITALS — BP 128/78 | HR 72 | Temp 97.9°F | Resp 18 | Ht 65.0 in | Wt 212.0 lb

## 2015-06-28 DIAGNOSIS — E669 Obesity, unspecified: Secondary | ICD-10-CM | POA: Diagnosis not present

## 2015-06-28 DIAGNOSIS — R7303 Prediabetes: Secondary | ICD-10-CM

## 2015-06-28 DIAGNOSIS — I10 Essential (primary) hypertension: Secondary | ICD-10-CM

## 2015-06-28 DIAGNOSIS — R7309 Other abnormal glucose: Secondary | ICD-10-CM | POA: Diagnosis not present

## 2015-06-28 DIAGNOSIS — Z720 Tobacco use: Secondary | ICD-10-CM

## 2015-06-28 DIAGNOSIS — J418 Mixed simple and mucopurulent chronic bronchitis: Secondary | ICD-10-CM | POA: Diagnosis not present

## 2015-06-28 NOTE — Patient Instructions (Signed)
We will call with lab results Work on smoking  F/U 4 months

## 2015-06-28 NOTE — Progress Notes (Signed)
Patient ID: Paula Long, female   DOB: 02/13/1951, 65 y.o.   MRN: [id number]    Subjective:    Patient ID: Paula Long, female    DOB: 1950/07/12, 65 y.o.   MRN: [id number]  Patient presents for 6 month F/U Patient for follow-up on chronic medical problems. She is also due for repeat labs. She has borderline diabetes mellitus. Her fasting blood sugars have been 120 to 130s  She's history chronic back pain she is currently in a pain clinic. He is currently in physical therapy is she's had some increased pain and pain on her right side. She still her chronic pain medications.  They're also concerned about her sleep pattern and possible sleep apnea as she is supposed be set up to have a sleep study.   She's been having more migraine she was started on Topamax  She does complain of her heel spurs but she wants to hold off on going back to podiatry at this time  COPD she continues to smoke but states that her breathing is good. She's not had to use her albuterol recently  Review Of Systems:  GEN- denies fatigue, fever, weight loss,weakness, recent illness HEENT- denies eye drainage, change in vision, nasal discharge, CVS- denies chest pain, palpitations RESP- denies SOB, cough, wheeze ABD- denies N/V, change in stools, abd pain GU- denies dysuria, hematuria, dribbling, incontinence MSK- denies joint pain, muscle aches, injury Neuro- denies headache, dizziness, syncope, seizure activity       Objective:    BP 128/78 mmHg  Pulse 72  Temp(Src) 97.9 F (36.6 C) (Oral)  Resp 18  Ht 5\' 5"  (1.651 m)  Wt 212 lb (96.163 kg)  BMI 35.28 kg/m2 GEN- NAD, alert and oriented x3 HEENT- PERRL, EOMI, non injected sclera, pink conjunctiva, MMM, oropharynx clear Neck- Supple, no thyromegaly CVS- RRR, no murmur RESP-CTAB ABD-NABS,soft,NT,ND EXT- No edema Pulses- Radial, DP- 2+        Assessment & Plan:      Problem List Items Addressed This Visit    Tobacco user   Prediabetes    Recheck her labs she is at risk for diabetes mellitus with her weight discussed the importance of healthy eating and need for weight loss. She's been borderline for quite some time      Relevant Orders   Hemoglobin A1c (Completed)   Obesity - Primary   Essential hypertension, benign    Blood pressure is well controlled medication medication      Relevant Orders   Basic metabolic panel (Completed)   COPD (chronic obstructive pulmonary disease) ([place])    Routine to reiterate the need for tobacco cessation she will continue her albuterol which she rarely uses         Note: This dictation was prepared with Dragon dictation along with smaller phrase technology. Any transcriptional errors that result from this process are unintentional.

## 2015-06-29 ENCOUNTER — Other Ambulatory Visit: Payer: Self-pay | Admitting: *Deleted

## 2015-06-29 LAB — BASIC METABOLIC PANEL
BUN: 15 mg/dL (ref 7–25)
CO2: 25 mmol/L (ref 20–31)
Calcium: 9 mg/dL (ref 8.6–10.4)
Chloride: 102 mmol/L (ref 98–110)
Creat: 1.09 mg/dL — ABNORMAL HIGH (ref 0.50–0.99)
Glucose, Bld: 88 mg/dL (ref 70–99)
Potassium: 4.7 mmol/L (ref 3.5–5.3)
Sodium: 136 mmol/L (ref 135–146)

## 2015-06-29 LAB — HEMOGLOBIN A1C
Hgb A1c MFr Bld: 6 % — ABNORMAL HIGH (ref <5.7)
Mean Plasma Glucose: 126 mg/dL

## 2015-06-29 MED ORDER — ESCITALOPRAM OXALATE 20 MG PO TABS: 20.0000 mg | ORAL_TABLET | Freq: Every day | ORAL | Status: DC

## 2015-06-29 MED ORDER — DEXLANSOPRAZOLE 60 MG PO CPDR: 60.0000 mg | DELAYED_RELEASE_CAPSULE | Freq: Every day | ORAL | Status: DC

## 2015-06-29 MED ORDER — TRAZODONE HCL 150 MG PO TABS: 150.0000 mg | ORAL_TABLET | Freq: Every day | ORAL | Status: DC

## 2015-06-29 MED ORDER — AMLODIPINE BESYLATE 10 MG PO TABS: 10.0000 mg | ORAL_TABLET | Freq: Every day | ORAL | Status: DC

## 2015-06-29 NOTE — Telephone Encounter (Signed)
Received call from patient.   Requested to have prescriptions sent to [facility].   Prescription sent to pharmacy.

## 2015-06-30 NOTE — Assessment & Plan Note (Signed)
Recheck her labs she is at risk for diabetes mellitus with her weight discussed the importance of healthy eating and need for weight loss. She's been borderline for quite some time

## 2015-06-30 NOTE — Assessment & Plan Note (Signed)
Routine to reiterate the need for tobacco cessation she will continue her albuterol which she rarely uses

## 2015-06-30 NOTE — Assessment & Plan Note (Signed)
Blood pressure is well controlled medication medication 

## 2015-07-01 ENCOUNTER — Other Ambulatory Visit (HOSPITAL_COMMUNITY): Payer: Self-pay | Admitting: Respiratory Therapy

## 2015-07-01 DIAGNOSIS — G4733 Obstructive sleep apnea (adult) (pediatric): Secondary | ICD-10-CM

## 2015-07-05 DIAGNOSIS — M5431 Sciatica, right side: Secondary | ICD-10-CM | POA: Diagnosis not present

## 2015-07-05 DIAGNOSIS — F17200 Nicotine dependence, unspecified, uncomplicated: Secondary | ICD-10-CM | POA: Diagnosis not present

## 2015-07-05 DIAGNOSIS — M461 Sacroiliitis, not elsewhere classified: Secondary | ICD-10-CM | POA: Diagnosis not present

## 2015-07-05 DIAGNOSIS — Z6834 Body mass index (BMI) 34.0-34.9, adult: Secondary | ICD-10-CM | POA: Diagnosis not present

## 2015-07-05 DIAGNOSIS — I10 Essential (primary) hypertension: Secondary | ICD-10-CM | POA: Diagnosis not present

## 2015-07-05 DIAGNOSIS — M542 Cervicalgia: Secondary | ICD-10-CM | POA: Diagnosis not present

## 2015-07-07 DIAGNOSIS — M542 Cervicalgia: Secondary | ICD-10-CM | POA: Diagnosis not present

## 2015-07-07 DIAGNOSIS — M461 Sacroiliitis, not elsewhere classified: Secondary | ICD-10-CM | POA: Diagnosis not present

## 2015-07-07 DIAGNOSIS — M5431 Sciatica, right side: Secondary | ICD-10-CM | POA: Diagnosis not present

## 2015-07-07 DIAGNOSIS — F17200 Nicotine dependence, unspecified, uncomplicated: Secondary | ICD-10-CM | POA: Diagnosis not present

## 2015-07-07 DIAGNOSIS — Z6834 Body mass index (BMI) 34.0-34.9, adult: Secondary | ICD-10-CM | POA: Diagnosis not present

## 2015-07-07 DIAGNOSIS — I10 Essential (primary) hypertension: Secondary | ICD-10-CM | POA: Diagnosis not present

## 2015-07-13 DIAGNOSIS — F17200 Nicotine dependence, unspecified, uncomplicated: Secondary | ICD-10-CM | POA: Diagnosis not present

## 2015-07-13 DIAGNOSIS — M542 Cervicalgia: Secondary | ICD-10-CM | POA: Diagnosis not present

## 2015-07-13 DIAGNOSIS — Z6834 Body mass index (BMI) 34.0-34.9, adult: Secondary | ICD-10-CM | POA: Diagnosis not present

## 2015-07-13 DIAGNOSIS — I10 Essential (primary) hypertension: Secondary | ICD-10-CM | POA: Diagnosis not present

## 2015-07-13 DIAGNOSIS — M461 Sacroiliitis, not elsewhere classified: Secondary | ICD-10-CM | POA: Diagnosis not present

## 2015-07-13 DIAGNOSIS — M5431 Sciatica, right side: Secondary | ICD-10-CM | POA: Diagnosis not present

## 2015-07-15 DIAGNOSIS — F17200 Nicotine dependence, unspecified, uncomplicated: Secondary | ICD-10-CM | POA: Diagnosis not present

## 2015-07-15 DIAGNOSIS — M461 Sacroiliitis, not elsewhere classified: Secondary | ICD-10-CM | POA: Diagnosis not present

## 2015-07-15 DIAGNOSIS — I10 Essential (primary) hypertension: Secondary | ICD-10-CM | POA: Diagnosis not present

## 2015-07-15 DIAGNOSIS — M542 Cervicalgia: Secondary | ICD-10-CM | POA: Diagnosis not present

## 2015-07-15 DIAGNOSIS — M5431 Sciatica, right side: Secondary | ICD-10-CM | POA: Diagnosis not present

## 2015-07-15 DIAGNOSIS — Z6834 Body mass index (BMI) 34.0-34.9, adult: Secondary | ICD-10-CM | POA: Diagnosis not present

## 2015-07-20 DIAGNOSIS — M5431 Sciatica, right side: Secondary | ICD-10-CM | POA: Diagnosis not present

## 2015-07-20 DIAGNOSIS — F17200 Nicotine dependence, unspecified, uncomplicated: Secondary | ICD-10-CM | POA: Diagnosis not present

## 2015-07-20 DIAGNOSIS — Z6834 Body mass index (BMI) 34.0-34.9, adult: Secondary | ICD-10-CM | POA: Diagnosis not present

## 2015-07-20 DIAGNOSIS — M542 Cervicalgia: Secondary | ICD-10-CM | POA: Diagnosis not present

## 2015-07-20 DIAGNOSIS — M461 Sacroiliitis, not elsewhere classified: Secondary | ICD-10-CM | POA: Diagnosis not present

## 2015-07-20 DIAGNOSIS — I10 Essential (primary) hypertension: Secondary | ICD-10-CM | POA: Diagnosis not present

## 2015-07-22 DIAGNOSIS — M542 Cervicalgia: Secondary | ICD-10-CM | POA: Diagnosis not present

## 2015-07-22 DIAGNOSIS — M5431 Sciatica, right side: Secondary | ICD-10-CM | POA: Diagnosis not present

## 2015-07-22 DIAGNOSIS — I10 Essential (primary) hypertension: Secondary | ICD-10-CM | POA: Diagnosis not present

## 2015-07-22 DIAGNOSIS — Z6834 Body mass index (BMI) 34.0-34.9, adult: Secondary | ICD-10-CM | POA: Diagnosis not present

## 2015-07-22 DIAGNOSIS — M461 Sacroiliitis, not elsewhere classified: Secondary | ICD-10-CM | POA: Diagnosis not present

## 2015-07-22 DIAGNOSIS — F17200 Nicotine dependence, unspecified, uncomplicated: Secondary | ICD-10-CM | POA: Diagnosis not present

## 2015-07-27 DIAGNOSIS — M5431 Sciatica, right side: Secondary | ICD-10-CM | POA: Diagnosis not present

## 2015-07-27 DIAGNOSIS — Z6834 Body mass index (BMI) 34.0-34.9, adult: Secondary | ICD-10-CM | POA: Diagnosis not present

## 2015-07-27 DIAGNOSIS — I10 Essential (primary) hypertension: Secondary | ICD-10-CM | POA: Diagnosis not present

## 2015-07-27 DIAGNOSIS — F17200 Nicotine dependence, unspecified, uncomplicated: Secondary | ICD-10-CM | POA: Diagnosis not present

## 2015-07-27 DIAGNOSIS — M461 Sacroiliitis, not elsewhere classified: Secondary | ICD-10-CM | POA: Diagnosis not present

## 2015-07-27 DIAGNOSIS — M542 Cervicalgia: Secondary | ICD-10-CM | POA: Diagnosis not present

## 2015-07-29 DIAGNOSIS — F17200 Nicotine dependence, unspecified, uncomplicated: Secondary | ICD-10-CM | POA: Diagnosis not present

## 2015-07-29 DIAGNOSIS — I10 Essential (primary) hypertension: Secondary | ICD-10-CM | POA: Diagnosis not present

## 2015-07-29 DIAGNOSIS — M5431 Sciatica, right side: Secondary | ICD-10-CM | POA: Diagnosis not present

## 2015-07-29 DIAGNOSIS — Z6834 Body mass index (BMI) 34.0-34.9, adult: Secondary | ICD-10-CM | POA: Diagnosis not present

## 2015-07-29 DIAGNOSIS — M542 Cervicalgia: Secondary | ICD-10-CM | POA: Diagnosis not present

## 2015-07-29 DIAGNOSIS — M461 Sacroiliitis, not elsewhere classified: Secondary | ICD-10-CM | POA: Diagnosis not present

## 2015-08-09 ENCOUNTER — Other Ambulatory Visit: Payer: Self-pay | Admitting: Family Medicine

## 2015-08-10 NOTE — Telephone Encounter (Signed)
Medication refilled per protocol. 

## 2015-08-12 ENCOUNTER — Ambulatory Visit: Payer: Commercial Managed Care - HMO | Attending: Neurology | Admitting: Neurology

## 2015-08-12 DIAGNOSIS — J449 Chronic obstructive pulmonary disease, unspecified: Secondary | ICD-10-CM | POA: Diagnosis not present

## 2015-08-12 DIAGNOSIS — I10 Essential (primary) hypertension: Secondary | ICD-10-CM | POA: Diagnosis not present

## 2015-08-12 DIAGNOSIS — M5416 Radiculopathy, lumbar region: Secondary | ICD-10-CM | POA: Diagnosis not present

## 2015-08-12 DIAGNOSIS — M545 Low back pain: Secondary | ICD-10-CM | POA: Diagnosis not present

## 2015-08-12 DIAGNOSIS — K219 Gastro-esophageal reflux disease without esophagitis: Secondary | ICD-10-CM | POA: Diagnosis not present

## 2015-08-12 DIAGNOSIS — M542 Cervicalgia: Secondary | ICD-10-CM | POA: Diagnosis not present

## 2015-08-12 DIAGNOSIS — M461 Sacroiliitis, not elsewhere classified: Secondary | ICD-10-CM | POA: Diagnosis not present

## 2015-08-12 DIAGNOSIS — G4761 Periodic limb movement disorder: Secondary | ICD-10-CM | POA: Insufficient documentation

## 2015-08-12 DIAGNOSIS — G4733 Obstructive sleep apnea (adult) (pediatric): Secondary | ICD-10-CM | POA: Diagnosis not present

## 2015-08-12 DIAGNOSIS — G479 Sleep disorder, unspecified: Secondary | ICD-10-CM | POA: Diagnosis not present

## 2015-08-21 NOTE — Procedures (Signed)
Maitland A. [name], MD     [email or web address]             NOCTURNAL POLYSOMNOGRAPHY   LOCATION: [facility]  Patient Name: Martella, Medine Date: 08/12/2015 Gender: Female D.O.B: 29-Jul-1950 Age (years): 43 Referring Provider: Not Available Height (inches): 65 Interpreting Physician: Phillips Odor MD, ABSM Weight (lbs): 210 RPSGT: Peak, Robert BMI: 35 MRN: [id number] Neck Size: 15.00 CLINICAL INFORMATION Sleep Study Type: NPSG Indication for sleep study: N/A Epworth Sleepiness Score: 18 SLEEP STUDY TECHNIQUE As per the AASM Manual for the Scoring of Sleep and Associated Events v2.3 (April 2016) with a hypopnea requiring 4% desaturations. The channels recorded and monitored were frontal, central and occipital EEG, electrooculogram (EOG), submentalis EMG (chin), nasal and oral airflow, thoracic and abdominal wall motion, anterior tibialis EMG, snore microphone, electrocardiogram, and pulse oximetry. MEDICATIONS Patient's medications include: N/A. Medications self-administered by patient during sleep study : Sleep medicine administered - TRAZADONE 150 MG at 10:43:28 PM,GABAPENTIN 400 MG at 10:43:53 PM,TAPIRAMATE 25 MG at 10:44:04 PM  Current outpatient prescriptions:  .  albuterol (PROVENTIL) (2.5 MG/3ML) 0.083% nebulizer solution, Take 3 mLs (2.5 mg total) by nebulization every 4 (four) hours as needed for wheezing or shortness of breath., Disp: 150 mL, Rfl: 1 .  amLODipine (NORVASC) 10 MG tablet, Take 1 tablet (10 mg total) by mouth daily., Disp: 90 tablet, Rfl: 3 .  cholecalciferol (VITAMIN D) 1000 units tablet, Take 1,000 Units by mouth daily., Disp: , Rfl:  .  dexlansoprazole (DEXILANT) 60 MG capsule, Take 1 capsule (60 mg total) by mouth daily., Disp: 90 capsule, Rfl: 3 .  EASYMAX TEST test strip, USE TO TEST ONCE DAILY., Disp: 100 each, Rfl: 3 .  escitalopram (LEXAPRO) 20 MG tablet, Take 1 tablet (20 mg total) by mouth daily., Disp: 90 tablet,  Rfl: 2 .  gabapentin (NEURONTIN) 400 MG capsule, Take 400 mg by mouth 2 (two) times daily., Disp: , Rfl:  .  Oxymorphone ER, Crush Resist, (OPANA ER, CRUSH RESISTANT,) 15 MG T12A, Twice a dayjscript:void(0) (Patient taking differently: Take 15 mg by mouth 2 (two) times daily. ), Disp: 60 tablet, Rfl: 0 .  topiramate (TOPAMAX) 25 MG tablet, Take 25 mg by mouth daily., Disp: , Rfl:  .  traZODone (DESYREL) 150 MG tablet, Take 1 tablet (150 mg total) by mouth at bedtime., Disp: 90 tablet, Rfl: 3 .  VENTOLIN HFA 108 (90 Base) MCG/ACT inhaler, INHALE 2 PUFFS EVERY 4 HOURS AS NEEDED FOR WHEEZING OR SHORTNESS OF BREATH, Disp: 18 g, Rfl: 3  SLEEP ARCHITECTURE The study was initiated at 9:29:21 PM and ended at 5:18:09 AM. Sleep onset time was 63.3 minutes and the sleep efficiency was 84.4%. The total sleep time was 395.5 minutes. Stage REM latency was 131.0 minutes. The patient spent 0.76% of the night in stage N1 sleep, 60.05% in stage N2 sleep, 20.61% in stage N3 and 18.59% in REM. Alpha intrusion was absent. Supine sleep was 24.26%. RESPIRATORY PARAMETERS The overall apnea/hypopnea index (AHI) was 1.2 per hour. There were 5 total apneas, including 1 obstructive, 4 central and 0 mixed apneas. There were 3 hypopneas and 6 RERAs. The AHI during Stage REM sleep was 0.8 per hour. AHI while supine was 1.3 per hour. The mean oxygen saturation was 90.01%. The minimum SpO2 during sleep was 85.00%. Soft snoring was noted during this study. CARDIAC DATA The 2 lead EKG demonstrated sinus rhythm. The mean heart rate was 60.09 beats per minute. Other EKG findings include: None. LEG  MOVEMENT DATA The total PLMS were 73 with a resulting PLMS index of 11.08. Associated arousal with leg movement index was 0.6.    IMPRESSIONS - Mild periodic limb movements of sleep occurred during the study. No significant associated arousals. - No significant obstructive sleep apnea occurred during this study. - No significant  central sleep apnea occurred during this study.   Delano Metz, MD Diplomate, American Board of Sleep Medicine.

## 2015-08-23 DIAGNOSIS — M545 Low back pain: Secondary | ICD-10-CM | POA: Diagnosis not present

## 2015-08-23 DIAGNOSIS — G479 Sleep disorder, unspecified: Secondary | ICD-10-CM | POA: Diagnosis not present

## 2015-08-23 DIAGNOSIS — K219 Gastro-esophageal reflux disease without esophagitis: Secondary | ICD-10-CM | POA: Diagnosis not present

## 2015-08-23 DIAGNOSIS — Z79899 Other long term (current) drug therapy: Secondary | ICD-10-CM | POA: Diagnosis not present

## 2015-08-23 DIAGNOSIS — J449 Chronic obstructive pulmonary disease, unspecified: Secondary | ICD-10-CM | POA: Diagnosis not present

## 2015-08-23 DIAGNOSIS — M542 Cervicalgia: Secondary | ICD-10-CM | POA: Diagnosis not present

## 2015-08-23 DIAGNOSIS — M5416 Radiculopathy, lumbar region: Secondary | ICD-10-CM | POA: Diagnosis not present

## 2015-08-23 DIAGNOSIS — R252 Cramp and spasm: Secondary | ICD-10-CM | POA: Diagnosis not present

## 2015-08-23 DIAGNOSIS — I10 Essential (primary) hypertension: Secondary | ICD-10-CM | POA: Diagnosis not present

## 2015-08-23 DIAGNOSIS — M461 Sacroiliitis, not elsewhere classified: Secondary | ICD-10-CM | POA: Diagnosis not present

## 2015-08-25 ENCOUNTER — Ambulatory Visit (INDEPENDENT_AMBULATORY_CARE_PROVIDER_SITE_OTHER): Payer: Commercial Managed Care - HMO | Admitting: Family Medicine

## 2015-08-25 ENCOUNTER — Other Ambulatory Visit: Payer: Self-pay | Admitting: Family Medicine

## 2015-08-25 ENCOUNTER — Encounter: Payer: Self-pay | Admitting: Family Medicine

## 2015-08-25 VITALS — BP 142/78 | HR 84 | Temp 97.8°F | Resp 16 | Ht 65.0 in | Wt 210.0 lb

## 2015-08-25 DIAGNOSIS — M549 Dorsalgia, unspecified: Secondary | ICD-10-CM | POA: Diagnosis not present

## 2015-08-25 DIAGNOSIS — G8929 Other chronic pain: Secondary | ICD-10-CM | POA: Diagnosis not present

## 2015-08-25 DIAGNOSIS — J069 Acute upper respiratory infection, unspecified: Secondary | ICD-10-CM | POA: Diagnosis not present

## 2015-08-25 DIAGNOSIS — M48062 Spinal stenosis, lumbar region with neurogenic claudication: Secondary | ICD-10-CM

## 2015-08-25 DIAGNOSIS — R609 Edema, unspecified: Secondary | ICD-10-CM | POA: Diagnosis not present

## 2015-08-25 DIAGNOSIS — J418 Mixed simple and mucopurulent chronic bronchitis: Secondary | ICD-10-CM

## 2015-08-25 DIAGNOSIS — I10 Essential (primary) hypertension: Secondary | ICD-10-CM

## 2015-08-25 DIAGNOSIS — R600 Localized edema: Secondary | ICD-10-CM

## 2015-08-25 DIAGNOSIS — M4806 Spinal stenosis, lumbar region: Secondary | ICD-10-CM | POA: Diagnosis not present

## 2015-08-25 MED ORDER — CETIRIZINE HCL 10 MG PO TABS
10.0000 mg | ORAL_TABLET | Freq: Every day | ORAL | Status: DC
Start: 2015-08-25 — End: 2015-12-07

## 2015-08-25 MED ORDER — METHYLPREDNISOLONE ACETATE 40 MG/ML IJ SUSP
40.0000 mg | Freq: Once | INTRAMUSCULAR | Status: AC
  Administered 2015-08-25: 40 mg via INTRAMUSCULAR

## 2015-08-25 MED ORDER — METHYLPREDNISOLONE ACETATE 80 MG/ML IJ SUSP: 80.0000 mg | Freq: Once | INTRAMUSCULAR | Status: DC

## 2015-08-25 MED ORDER — AZITHROMYCIN 250 MG PO TABS: ORAL_TABLET | ORAL | Status: DC

## 2015-08-25 MED ORDER — HYDROCHLOROTHIAZIDE 25 MG PO TABS: 25.0000 mg | ORAL_TABLET | Freq: Every day | ORAL | Status: DC

## 2015-08-25 NOTE — Patient Instructions (Addendum)
For fluid and blood pressure get HCTZ take once a day in the morning For sinuses- take ANTIBIOTICS, USE ZYRTEC ONCE A DAY  Referral to Dr. [name] F/U 3 weeks for blood pressure

## 2015-08-25 NOTE — Assessment & Plan Note (Signed)
With underlying URI will treat per above hopefully this would not cause an actual COPD exacerbation

## 2015-08-25 NOTE — Progress Notes (Signed)
Patient ID: Paula Long, female   DOB: 1950-03-30, 65 y.o.   MRN: [id number]    Subjective:    Patient ID: Paula Long, female    DOB: 25-Jun-1950, 65 y.o.   MRN: [id number]  Patient presents for Illness and Referral Patient here with sinus pressure and nasal congestion right ear pain mild productive cough. She does have underlying COPD but states she has not been short of breath has not had new wheezing no fever. She has not used any medication over-the-counter. She does not one use any nasal sprays for her drainage.  She also needs a new referral to recertify for her pain clinic  She is also noticing swelling in her ankles and that her blood pressures been elevated for the past month. She states when she went to a specialist office one time it was in the [id number] systolic.  Review Of Systems:  GEN- denies fatigue, fever, weight loss,weakness, recent illness HEENT- denies eye drainage, change in vision, +nasal discharge, CVS- denies chest pain, palpitations RESP- denies SOB, +cough, wheeze ABD- denies N/V, change in stools, abd pain GU- denies dysuria, hematuria, dribbling, incontinence MSK- denies joint pain, muscle aches, injury Neuro- denies headache, dizziness, syncope, seizure activity       Objective:    BP 142/78 mmHg  Pulse 84  Temp(Src) 97.8 F (36.6 C) (Oral)  Resp 16  Ht 5\' 5"  (1.651 m)  Wt 210 lb (95.255 kg)  BMI 34.95 kg/m2  SpO2 96% GEN- NAD, alert and oriented x3 HEENT- PERRL, EOMI, non injected sclera, pink conjunctiva, MMM, oropharynx mild injection, TM clear bilat no effusion,  + mild maxillary sinus tenderness, inflammed turbinates,  Nasal drainage  Neck- Supple, no LAD CVS- RRR, no murmur RESP-CTAB EXT- pedal  edema Pulses- Radial 2+          Assessment & Plan:      Problem List Items Addressed This Visit    Essential hypertension, benign    Blood pressure is elevated though her weight fairly steady. She is on Norvasc 10 mg.  Abdomen at hydrochlorothiazide 25 mg to see if we get some fluid off. It is possible that the fluid is coming from the amlodipine she has been on this for quite some time without any difficulties. Recheck in 3 weeks      Relevant Medications   hydrochlorothiazide (HYDRODIURIL) 25 MG tablet   COPD (chronic obstructive pulmonary disease) (HCC)    With underlying URI will treat per above hopefully this would not cause an actual COPD exacerbation      Relevant Medications   cetirizine (ZYRTEC) 10 MG tablet   azithromycin (ZITHROMAX Z-PAK) 250 MG tablet   methylPREDNISolone acetate (DEPO-MEDROL) injection 40 mg (Completed)    Other Visit Diagnoses    Peripheral edema    -  Primary    HCTZ added will also help BP, low salt    Acute URI        More URI, no sign of COPD exacerbation, given Depo Medrol , zpak see f this helps with congestion with her BP no Decongestants. She declines nasal sprays, given zyrtec    Relevant Medications    azithromycin (ZITHROMAX Z-PAK) 250 MG tablet    methylPREDNISolone acetate (DEPO-MEDROL) injection 40 mg (Completed)       Note: This dictation was prepared with Dragon dictation along with smaller phrase technology. Any transcriptional errors that result from this process are unintentional.

## 2015-08-25 NOTE — Assessment & Plan Note (Addendum)
Blood pressure is elevated though her weight fairly steady. She is on Norvasc 10 mg. Abdomen at hydrochlorothiazide 25 mg to see if we get some fluid off. It is possible that the fluid is coming from the amlodipine she has been on this for quite some time without any difficulties. Recheck in 3 weeks

## 2015-09-01 ENCOUNTER — Other Ambulatory Visit: Payer: Self-pay | Admitting: Neurology

## 2015-09-01 DIAGNOSIS — M545 Low back pain: Secondary | ICD-10-CM

## 2015-09-07 ENCOUNTER — Telehealth: Payer: Self-pay | Admitting: Family Medicine

## 2015-09-07 MED ORDER — KETOCONAZOLE 2 % EX CREA: 1.0000 "application " | TOPICAL_CREAM | Freq: Two times a day (BID) | CUTANEOUS | Status: DC

## 2015-09-07 NOTE — Telephone Encounter (Signed)
Pt is requesting a prescription of Ketoconazole 2% cream sent to [facility]

## 2015-09-07 NOTE — Telephone Encounter (Signed)
Prescription sent to pharmacy.

## 2015-09-09 ENCOUNTER — Ambulatory Visit (HOSPITAL_COMMUNITY)
Admission: RE | Admit: 2015-09-09 | Discharge: 2015-09-09 | Disposition: A | Discharge status: Home / Self Care | Payer: Commercial Managed Care - HMO | Source: Ambulatory Visit | Attending: Neurology | Admitting: Neurology

## 2015-09-09 DIAGNOSIS — M545 Low back pain: Secondary | ICD-10-CM | POA: Insufficient documentation

## 2015-09-09 DIAGNOSIS — M479 Spondylosis, unspecified: Secondary | ICD-10-CM | POA: Diagnosis not present

## 2015-09-09 DIAGNOSIS — Z9889 Other specified postprocedural states: Secondary | ICD-10-CM | POA: Diagnosis not present

## 2015-09-09 DIAGNOSIS — M47816 Spondylosis without myelopathy or radiculopathy, lumbar region: Secondary | ICD-10-CM | POA: Diagnosis not present

## 2015-09-15 ENCOUNTER — Ambulatory Visit: Payer: Commercial Managed Care - HMO | Admitting: Family Medicine

## 2015-09-23 DIAGNOSIS — M545 Low back pain: Secondary | ICD-10-CM | POA: Diagnosis not present

## 2015-09-23 DIAGNOSIS — Z79891 Long term (current) use of opiate analgesic: Secondary | ICD-10-CM | POA: Diagnosis not present

## 2015-09-23 DIAGNOSIS — K219 Gastro-esophageal reflux disease without esophagitis: Secondary | ICD-10-CM | POA: Diagnosis not present

## 2015-09-23 DIAGNOSIS — M461 Sacroiliitis, not elsewhere classified: Secondary | ICD-10-CM | POA: Diagnosis not present

## 2015-09-23 DIAGNOSIS — I10 Essential (primary) hypertension: Secondary | ICD-10-CM | POA: Diagnosis not present

## 2015-09-23 DIAGNOSIS — M542 Cervicalgia: Secondary | ICD-10-CM | POA: Diagnosis not present

## 2015-09-23 DIAGNOSIS — M5416 Radiculopathy, lumbar region: Secondary | ICD-10-CM | POA: Diagnosis not present

## 2015-09-23 DIAGNOSIS — R252 Cramp and spasm: Secondary | ICD-10-CM | POA: Diagnosis not present

## 2015-09-23 DIAGNOSIS — J449 Chronic obstructive pulmonary disease, unspecified: Secondary | ICD-10-CM | POA: Diagnosis not present

## 2015-10-08 ENCOUNTER — Other Ambulatory Visit: Payer: Self-pay | Admitting: Family Medicine

## 2015-10-21 DIAGNOSIS — M461 Sacroiliitis, not elsewhere classified: Secondary | ICD-10-CM | POA: Diagnosis not present

## 2015-10-21 DIAGNOSIS — J449 Chronic obstructive pulmonary disease, unspecified: Secondary | ICD-10-CM | POA: Diagnosis not present

## 2015-10-21 DIAGNOSIS — G479 Sleep disorder, unspecified: Secondary | ICD-10-CM | POA: Diagnosis not present

## 2015-10-21 DIAGNOSIS — I10 Essential (primary) hypertension: Secondary | ICD-10-CM | POA: Diagnosis not present

## 2015-10-21 DIAGNOSIS — G4733 Obstructive sleep apnea (adult) (pediatric): Secondary | ICD-10-CM | POA: Diagnosis not present

## 2015-10-21 DIAGNOSIS — M5416 Radiculopathy, lumbar region: Secondary | ICD-10-CM | POA: Diagnosis not present

## 2015-10-21 DIAGNOSIS — M545 Low back pain: Secondary | ICD-10-CM | POA: Diagnosis not present

## 2015-10-21 DIAGNOSIS — K219 Gastro-esophageal reflux disease without esophagitis: Secondary | ICD-10-CM | POA: Diagnosis not present

## 2015-10-21 DIAGNOSIS — M542 Cervicalgia: Secondary | ICD-10-CM | POA: Diagnosis not present

## 2015-11-01 ENCOUNTER — Ambulatory Visit: Payer: Commercial Managed Care - HMO | Admitting: Family Medicine

## 2015-11-03 ENCOUNTER — Other Ambulatory Visit: Payer: Self-pay | Admitting: Family Medicine

## 2015-11-04 NOTE — Telephone Encounter (Signed)
Refill appropriate and filled per protocol. 

## 2015-11-08 ENCOUNTER — Encounter: Payer: Self-pay | Admitting: Family Medicine

## 2015-11-08 ENCOUNTER — Ambulatory Visit (INDEPENDENT_AMBULATORY_CARE_PROVIDER_SITE_OTHER): Payer: Commercial Managed Care - HMO | Admitting: Family Medicine

## 2015-11-08 VITALS — BP 132/74 | HR 66 | Temp 97.8°F | Resp 16 | Ht 65.0 in | Wt 206.0 lb

## 2015-11-08 DIAGNOSIS — I10 Essential (primary) hypertension: Secondary | ICD-10-CM | POA: Diagnosis not present

## 2015-11-08 DIAGNOSIS — J418 Mixed simple and mucopurulent chronic bronchitis: Secondary | ICD-10-CM | POA: Diagnosis not present

## 2015-11-08 DIAGNOSIS — Z720 Tobacco use: Secondary | ICD-10-CM

## 2015-11-08 DIAGNOSIS — L304 Erythema intertrigo: Secondary | ICD-10-CM | POA: Diagnosis not present

## 2015-11-08 DIAGNOSIS — E669 Obesity, unspecified: Secondary | ICD-10-CM

## 2015-11-08 MED ORDER — CLOTRIMAZOLE-BETAMETHASONE 1-0.05 % EX LOTN: TOPICAL_LOTION | Freq: Two times a day (BID) | CUTANEOUS | 1 refills | Status: DC

## 2015-11-08 MED ORDER — FLUCONAZOLE 100 MG PO TABS: ORAL_TABLET | ORAL | 1 refills | Status: DC

## 2015-11-08 NOTE — Assessment & Plan Note (Signed)
Currently stable continue to counsel on tobacco cessation she is not interested in quitting at this time

## 2015-11-08 NOTE — Progress Notes (Signed)
   Subjective:    Patient ID: Paula Long, female    DOB: 1950/10/14, 65 y.o.   MRN: [id number]  Patient presents for Follow-up (is not fasting) Pt here To follow-up medications and chronic problems. She still being followed by the pain clinic there actually tapering her off of her Opana she will be getting what sounds like facet injections this Friday.  She continues to smoke at least 1-1/2 packs per day. She denies any difficulty with her breathing currently.  She continues to have problems with yeast beneath the breasts as well as in the groin area.  BP has improved, swelling has gone down with HCTZ  Meds reviewed  Review Of Systems:  GEN- denies fatigue, fever, weight loss,weakness, recent illness HEENT- denies eye drainage, change in vision, nasal discharge, CVS- denies chest pain, palpitations RESP- denies SOB, cough, wheeze ABD- denies N/V, change in stools, abd pain GU- denies dysuria, hematuria, dribbling, incontinence MSK- +joint pain, muscle aches, injury Neuro- denies headache, dizziness, syncope, seizure activity       Objective:    BP 132/74 (BP Location: Left Arm, Patient Position: Sitting, Cuff Size: Normal)   Pulse 66   Temp 97.8 F (36.6 C) (Oral)   Resp 16   Ht 5\' 5"  (1.651 m)   Wt 206 lb (93.4 kg)   BMI 34.28 kg/m  GEN- NAD, alert and oriented x3 HEENT- PERRL, EOMI, non injected sclera, pink conjunctiva, MMM, oropharynx clear CVS- RRR, no murmur RESP-CTAB Skin- erythema with moisture beneath breast and plaque like erythematous lesions in groin bilat +excoriations  ABD-NABS,soft,NT,ND EXT- No edema Pulses- Radial, DP- 2+        Assessment & Plan:      Problem List Items Addressed This Visit    Tobacco user   Obesity   Intertrigo - Primary    Will give diflucan po x 2 doses, change to lotrisone cream, continue nystatin powder, okay to use in groin area as well      Essential hypertension, benign    Blood pressure is improved  swelling also resolved no change medication      COPD (chronic obstructive pulmonary disease) ([facility])    Currently stable continue to counsel on tobacco cessation she is not interested in quitting at this time       Other Visit Diagnoses   None.     Note: This dictation was prepared with Dragon dictation along with smaller phrase technology. Any transcriptional errors that result from this process are unintentional.

## 2015-11-08 NOTE — Patient Instructions (Signed)
Use the new cream Lotrisone and use diflucan pills You have to quit smoking ! F/U November Physical

## 2015-11-08 NOTE — Assessment & Plan Note (Signed)
Blood pressure is improved swelling also resolved no change medication

## 2015-11-08 NOTE — Assessment & Plan Note (Signed)
Will give diflucan po x 2 doses, change to lotrisone cream, continue nystatin powder, okay to use in groin area as well

## 2015-11-10 ENCOUNTER — Other Ambulatory Visit: Payer: Self-pay | Admitting: Family Medicine

## 2015-11-10 DIAGNOSIS — Z1231 Encounter for screening mammogram for malignant neoplasm of breast: Secondary | ICD-10-CM

## 2015-11-12 DIAGNOSIS — G4733 Obstructive sleep apnea (adult) (pediatric): Secondary | ICD-10-CM | POA: Diagnosis not present

## 2015-11-12 DIAGNOSIS — K219 Gastro-esophageal reflux disease without esophagitis: Secondary | ICD-10-CM | POA: Diagnosis not present

## 2015-11-12 DIAGNOSIS — G479 Sleep disorder, unspecified: Secondary | ICD-10-CM | POA: Diagnosis not present

## 2015-11-12 DIAGNOSIS — M542 Cervicalgia: Secondary | ICD-10-CM | POA: Diagnosis not present

## 2015-11-12 DIAGNOSIS — J449 Chronic obstructive pulmonary disease, unspecified: Secondary | ICD-10-CM | POA: Diagnosis not present

## 2015-11-12 DIAGNOSIS — M5416 Radiculopathy, lumbar region: Secondary | ICD-10-CM | POA: Diagnosis not present

## 2015-11-12 DIAGNOSIS — I10 Essential (primary) hypertension: Secondary | ICD-10-CM | POA: Diagnosis not present

## 2015-11-12 DIAGNOSIS — M461 Sacroiliitis, not elsewhere classified: Secondary | ICD-10-CM | POA: Diagnosis not present

## 2015-11-12 DIAGNOSIS — M545 Low back pain: Secondary | ICD-10-CM | POA: Diagnosis not present

## 2015-11-19 ENCOUNTER — Other Ambulatory Visit: Payer: Self-pay | Admitting: Family Medicine

## 2015-11-23 DIAGNOSIS — I10 Essential (primary) hypertension: Secondary | ICD-10-CM | POA: Diagnosis not present

## 2015-11-23 DIAGNOSIS — M542 Cervicalgia: Secondary | ICD-10-CM | POA: Diagnosis not present

## 2015-11-23 DIAGNOSIS — M461 Sacroiliitis, not elsewhere classified: Secondary | ICD-10-CM | POA: Diagnosis not present

## 2015-11-23 DIAGNOSIS — M545 Low back pain: Secondary | ICD-10-CM | POA: Diagnosis not present

## 2015-11-23 DIAGNOSIS — M5416 Radiculopathy, lumbar region: Secondary | ICD-10-CM | POA: Diagnosis not present

## 2015-11-23 DIAGNOSIS — J449 Chronic obstructive pulmonary disease, unspecified: Secondary | ICD-10-CM | POA: Diagnosis not present

## 2015-11-23 DIAGNOSIS — G4733 Obstructive sleep apnea (adult) (pediatric): Secondary | ICD-10-CM | POA: Diagnosis not present

## 2015-11-23 DIAGNOSIS — G479 Sleep disorder, unspecified: Secondary | ICD-10-CM | POA: Diagnosis not present

## 2015-11-23 DIAGNOSIS — K219 Gastro-esophageal reflux disease without esophagitis: Secondary | ICD-10-CM | POA: Diagnosis not present

## 2015-11-29 ENCOUNTER — Ambulatory Visit (HOSPITAL_COMMUNITY)
Admission: RE | Admit: 2015-11-29 | Discharge: 2015-11-29 | Disposition: A | Discharge status: Home / Self Care | Payer: Commercial Managed Care - HMO | Source: Ambulatory Visit | Attending: Family Medicine | Admitting: Family Medicine

## 2015-11-29 DIAGNOSIS — Z1231 Encounter for screening mammogram for malignant neoplasm of breast: Secondary | ICD-10-CM | POA: Diagnosis not present

## 2015-12-06 ENCOUNTER — Other Ambulatory Visit: Payer: Self-pay | Admitting: Family Medicine

## 2015-12-07 ENCOUNTER — Emergency Department (HOSPITAL_COMMUNITY): Payer: Commercial Managed Care - HMO

## 2015-12-07 ENCOUNTER — Emergency Department (HOSPITAL_COMMUNITY)
Admission: EM | Admit: 2015-12-07 | Discharge: 2015-12-07 | Disposition: A | Discharge status: Home / Self Care | Payer: Commercial Managed Care - HMO | Attending: Emergency Medicine | Admitting: Emergency Medicine

## 2015-12-07 ENCOUNTER — Encounter (HOSPITAL_COMMUNITY): Payer: Self-pay | Admitting: Emergency Medicine

## 2015-12-07 DIAGNOSIS — Z85828 Personal history of other malignant neoplasm of skin: Secondary | ICD-10-CM | POA: Insufficient documentation

## 2015-12-07 DIAGNOSIS — I10 Essential (primary) hypertension: Secondary | ICD-10-CM | POA: Diagnosis not present

## 2015-12-07 DIAGNOSIS — J449 Chronic obstructive pulmonary disease, unspecified: Secondary | ICD-10-CM | POA: Diagnosis not present

## 2015-12-07 DIAGNOSIS — F17210 Nicotine dependence, cigarettes, uncomplicated: Secondary | ICD-10-CM | POA: Diagnosis not present

## 2015-12-07 DIAGNOSIS — Z79899 Other long term (current) drug therapy: Secondary | ICD-10-CM | POA: Insufficient documentation

## 2015-12-07 DIAGNOSIS — R110 Nausea: Secondary | ICD-10-CM | POA: Insufficient documentation

## 2015-12-07 DIAGNOSIS — R109 Unspecified abdominal pain: Secondary | ICD-10-CM | POA: Diagnosis present

## 2015-12-07 DIAGNOSIS — K5900 Constipation, unspecified: Secondary | ICD-10-CM | POA: Diagnosis not present

## 2015-12-07 MED ORDER — POLYETHYLENE GLYCOL 3350 17 G PO PACK
17.0000 g | PACK | Freq: Once | ORAL | Status: AC
  Administered 2015-12-07: 17 g via ORAL
  Filled 2015-12-07: qty 1

## 2015-12-07 MED ORDER — POLYETHYLENE GLYCOL 3350 17 G PO PACK: 17.0000 g | PACK | Freq: Every day | ORAL | 0 refills | Status: AC | PRN

## 2015-12-07 NOTE — ED Notes (Signed)
Pt to Radiology

## 2015-12-07 NOTE — ED Provider Notes (Signed)
[facility] DEPT Provider Note   CSN: [id number] Arrival date & time: 12/07/15  1228     History   Chief Complaint Chief Complaint  Patient presents with  . Constipation    HPI Paula Long is a 65 y.o. female.  Pt with copd, chronic back pain, constipation, colon polyps, off narcotics for the past two months, fibromyalgia presents with difficulty using the bathroom for the past 3 weeks.  Pt strains.  Now requires laxatives to help.  Recurrent issue.  No current abd pain.  Mild lower discomfort resolves after laxative.  No wt loss or blood in stools.  No fevers or vomiting.  Pt had colonoscopy 2 yrs prior.  No neuro sxs. Mild back discomfort- pt has had injections in the past that relieved.  MRI back in recent past stable.     Past Medical History:  Diagnosis Date  . Anxiety   . Arthritis 12-07-11   arthritis,DDD,spinal stenosis  . Chronic back pain   . COPD (chronic obstructive pulmonary disease) ([place]) 12-07-11   pt. uses nebulizer as needed and inhaler daily  . Fibromyalgia    skin cancer  . GERD (gastroesophageal reflux disease)   . Hemorrhoids   . Hypertension 12-07-11   presently no meds  . Normal cardiac stress test 06/2009  . Shortness of breath 12-07-11   less now, occ wheezes  . Skin cancer    melanoma.  face 2014    Patient Active Problem List   Diagnosis Date Noted  . Depression with anxiety 05/22/2014  . Family hx of colon cancer 02/19/2014  . IBS (irritable bowel syndrome) 01/23/2014  . Urinary incontinence, mixed 10/13/2013  . Chronic insomnia 09/12/2013  . Boil 02/27/2013  . Skin lesion of face 10/22/2012  . Hemorrhoids 10/22/2012  . Chest pain 06/21/2012  . Intertrigo 06/21/2012  . Neck pain 06/21/2012  . Bunion of great toe 06/21/2012  . OAB (overactive bladder) 03/15/2012  . Spinal stenosis, lumbar region, with neurogenic claudication 12/11/2011  . Essential hypertension, benign 10/30/2011  . Chronic back pain 10/30/2011  . GERD  (gastroesophageal reflux disease) 10/30/2011  . COPD (chronic obstructive pulmonary disease) ([place]) 10/30/2011  . Tobacco user 10/30/2011  . Obesity 10/30/2011  . Prediabetes 10/30/2011  . Abnormal mammogram 10/30/2011    Past Surgical History:  Procedure Laterality Date  . ABDOMINAL HYSTERECTOMY    . BACK SURGERY  12-07-11   2'10 L5 x2  . COLONOSCOPY WITH PROPOFOL N/A 06/03/2014   Procedure: COLONOSCOPY WITH PROPOFOL; IN CECUM AT 0750; TOTAL WITHDRAWAL TIME 16 MINUTES;  Surgeon: Rogene Houston, MD;  Location: [facility];  Service: Endoscopy;  Laterality: N/A;  . left ankle surgery     tendonitis  . LUMBAR LAMINECTOMY/DECOMPRESSION MICRODISCECTOMY  12/11/2011   Procedure: LUMBAR LAMINECTOMY/DECOMPRESSION MICRODISCECTOMY;  Surgeon: Tobi Bastos, MD;  Location: [facility];  Service: Orthopedics;  Laterality: Left;  Decompressive Lumbar Laminectomy L5-S1 on Left  . POLYPECTOMY N/A 06/03/2014   Procedure: POLYPECTOMY;  Surgeon: Rogene Houston, MD;  Location: [facility];  Service: Endoscopy;  Laterality: N/A;  . right wrist surgery for pinched nerve    . trigger finger surgery  12-07-11   rt. middle trigger finger release    OB History    No data available       Home Medications    Prior to Admission medications   Medication Sig Start Date End Date Taking? Authorizing Provider  albuterol (PROVENTIL) (2.5 MG/3ML) 0.083% nebulizer solution Take 3 mLs (2.5 mg total)  by nebulization every 4 (four) hours as needed for wheezing or shortness of breath. 03/05/15  Yes Alycia Rossetti, MD  amLODipine (NORVASC) 10 MG tablet Take 1 tablet (10 mg total) by mouth daily. 06/29/15  Yes Alycia Rossetti, MD  clotrimazole-betamethasone (LOTRISONE) lotion Apply topically 2 (two) times daily. 11/08/15  Yes Alycia Rossetti, MD  dexlansoprazole (DEXILANT) 60 MG capsule Take 1 capsule (60 mg total) by mouth daily. 06/29/15  Yes Alycia Rossetti, MD  gabapentin (NEURONTIN) 400 MG capsule Take 800 mg by mouth at  bedtime.    Yes Historical Provider, MD  nystatin (MYCOSTATIN/NYSTOP) powder APPLY  TOPICALLY TWICE DAILY AS NEEDED 11/04/15  Yes Alycia Rossetti, MD  traZODone (DESYREL) 150 MG tablet Take 1 tablet (150 mg total) by mouth at bedtime. 06/29/15  Yes Alycia Rossetti, MD  VENTOLIN HFA [phone] Base) MCG/ACT inhaler INHALE 2 PUFFS EVERY 4 HOURS AS NEEDED FOR WHEEZING OR SHORTNESS OF BREATH 12/07/15  Yes Alycia Rossetti, MD  [facility] TEST test strip USE TO TEST ONCE DAILY. 06/03/15   Susy Frizzle, MD  Oxymorphone ER, Crush Resist, (OPANA ER, CRUSH RESISTANT,) 15 MG T12A Twice a dayjscript:void(0) Patient not taking: Reported on 12/07/2015 12/25/14   Alycia Rossetti, MD  polyethylene glycol [facility] / Floria Raveling) packet Take 17 g by mouth daily as needed. 12/07/15 12/13/15  Elnora Morrison, MD    Family History Family History  Problem Relation Age of Onset  . Cancer Mother     uterine  . Cancer Father     lung    Social History Social History  Substance Use Topics  . Smoking status: Current Every Day Smoker    Packs/day: 2.00    Years: 48.00    Types: Cigarettes  . Smokeless tobacco: Never Used     Comment: since age 14. smoking 2-3 packs a day  . Alcohol use No     Allergies   Sulfa antibiotics; Nystatin; Percocet [oxycodone-acetaminophen]; Vesicare [solifenacin]; and Vicodin [hydrocodone-acetaminophen]   Review of Systems Review of Systems  Constitutional: Negative for chills and fever.  HENT: Negative for congestion.   Eyes: Negative for visual disturbance.  Respiratory: Negative for shortness of breath.   Cardiovascular: Negative for chest pain.  Gastrointestinal: Positive for abdominal pain, constipation and nausea. Negative for vomiting.  Genitourinary: Negative for difficulty urinating, dysuria and flank pain.  Musculoskeletal: Negative for back pain, neck pain and neck stiffness.  Skin: Negative for rash.  Neurological: Negative for light-headedness and headaches.      Physical Exam Updated Vital Signs BP 145/56 (BP Location: Right Arm)   Pulse 64   Temp 97.6 F (36.4 C) (Oral)   Resp 18   Ht 5\' 5"  (1.651 m)   Wt 180 lb (81.6 kg)   SpO2 100%   BMI 29.95 kg/m   Physical Exam  Constitutional: She is oriented to person, place, and time. She appears well-developed and well-nourished.  HENT:  Head: Normocephalic and atraumatic.  Eyes: Conjunctivae are normal. Right eye exhibits no discharge. Left eye exhibits no discharge.  Neck: Normal range of motion. Neck supple. No tracheal deviation present.  Cardiovascular: Normal rate and regular rhythm.   Pulmonary/Chest: Effort normal and breath sounds normal.  Abdominal: Soft. She exhibits no distension. There is no tenderness. There is no guarding.  Musculoskeletal: She exhibits tenderness (mild tender paraspinal lumbar, no midline tenderness). She exhibits no edema.  Neurological: She is alert and oriented to person, place, and time. She has  normal strength. No sensory deficit. GCS eye subscore is 4. GCS verbal subscore is 5. GCS motor subscore is 6.  Skin: Skin is warm. No rash noted.  Psychiatric: She has a normal mood and affect.  Nursing note and vitals reviewed.    ED Treatments / Results  Labs (all labs ordered are listed, but only abnormal results are displayed) Labs Reviewed - No data to display  EKG  EKG Interpretation None       Radiology Dg Abdomen Acute W/chest  Result Date: 12/07/2015 CLINICAL DATA:  Chronic constipation, low back pain and nausea. EXAM: DG ABDOMEN ACUTE W/ 1V CHEST COMPARISON:  03/26/2015 FINDINGS: Normal heart size and vascularity. Lungs remain clear. No focal pneumonia, collapse or consolidation. Negative for edema, effusion or pneumothorax. Trachea midline. Aortic atherosclerosis noted. Suspect stable calcified granulomas in the medial right lower lobe. Negative for free air. No significant dilatation, ileus, or obstruction pattern. Overall gas and stool  burden appears normal. Aortoiliac atherosclerosis evident. Degenerative changes of the spine and pelvis. Benign pelvic calcifications. IMPRESSION: No acute finding in the chest or abdomen by plain radiography Negative for obstruction or free air Normal stool burden by plain radiography. Electronically Signed   By: Jerilynn Mages.  Shick M.D.   On: 12/07/2015 15:26    Procedures Procedures (including critical care time)  Medications Ordered in ED Medications  polyethylene glycol (MIRALAX / GLYCOLAX) packet 17 g (17 g Oral Given 12/07/15 1537)     Initial Impression / Assessment and Plan / ED Course  I have reviewed the triage vital signs and the nursing notes.  Pertinent labs & imaging results that were available during my care of the patient were reviewed by me and considered in my medical decision making (see chart for details).  Clinical Course   Pt with no current abd pain or vomiting presents with recurrent constipation sxs.  No neuro sxs, no fevers.   Xray unremarkable,.   Miralax in ED. Discussed trial of miralax and outpt fup with GI/ pcp.   Results and differential diagnosis were discussed with the patient/parent/guardian. Xrays were independently reviewed by myself.  Close follow up outpatient was discussed, comfortable with the plan.   Medications  polyethylene glycol (MIRALAX / GLYCOLAX) packet 17 g (17 g Oral Given 12/07/15 1537)    Vitals:   12/07/15 1233 12/07/15 1234 12/07/15 1457 12/07/15 1641  BP:  147/58 145/56 153/58  Pulse:  83 64 61  Resp:  20 18 18   Temp:  97.9 F (36.6 C) 97.6 F (36.4 C) 97.4 F (36.3 C)  TempSrc:  Oral Oral Oral  SpO2:  99% 100% 99%  Weight: 180 lb (81.6 kg)     Height: 5\' 5"  (1.651 m)       Final diagnoses:  Constipation, unspecified constipation type     Final Clinical Impressions(s) / ED Diagnoses   Final diagnoses:  Constipation, unspecified constipation type    New Prescriptions New Prescriptions   POLYETHYLENE GLYCOL  (MIRALAX / GLYCOLAX) PACKET    Take 17 g by mouth daily as needed.     Elnora Morrison, MD 12/07/15 [phone]

## 2015-12-07 NOTE — ED Triage Notes (Signed)
Pt states that she believe that her bowels are blocked because she can't go to the bathroom. She has been taking laxative on a regular basis. Pt took one this morning. Pt states that she feels like she needs to go but can't go.

## 2015-12-07 NOTE — Discharge Instructions (Signed)
If you were given medicines take as directed.  If you are on coumadin or contraceptives realize their levels and effectiveness is altered by many different medicines.  If you have any reaction (rash, tongues swelling, other) to the medicines stop taking and see a physician.    If your blood pressure was elevated in the ER make sure you follow up for management with a primary doctor or return for chest pain, shortness of breath or stroke symptoms.  Please follow up as directed and return to the ER or see a physician for new or worsening symptoms.  Thank you. Vitals:   12/07/15 1233 12/07/15 1234 12/07/15 1457  BP:  147/58 145/56  Pulse:  83 64  Resp:  20 18  Temp:  97.9 F (36.6 C) 97.6 F (36.4 C)  TempSrc:  Oral Oral  SpO2:  99% 100%  Weight: 180 lb (81.6 kg)    Height: 5\' 5"  (1.651 m)

## 2015-12-07 NOTE — ED Triage Notes (Signed)
Pt states that problems started 3 months ago but the pain didn't start until yesterday. Pt states that she usually has a regular BM every morning.

## 2015-12-08 DIAGNOSIS — G479 Sleep disorder, unspecified: Secondary | ICD-10-CM | POA: Diagnosis not present

## 2015-12-08 DIAGNOSIS — M542 Cervicalgia: Secondary | ICD-10-CM | POA: Diagnosis not present

## 2015-12-08 DIAGNOSIS — K219 Gastro-esophageal reflux disease without esophagitis: Secondary | ICD-10-CM | POA: Diagnosis not present

## 2015-12-08 DIAGNOSIS — M545 Low back pain: Secondary | ICD-10-CM | POA: Diagnosis not present

## 2015-12-08 DIAGNOSIS — J449 Chronic obstructive pulmonary disease, unspecified: Secondary | ICD-10-CM | POA: Diagnosis not present

## 2015-12-08 DIAGNOSIS — M461 Sacroiliitis, not elsewhere classified: Secondary | ICD-10-CM | POA: Diagnosis not present

## 2015-12-08 DIAGNOSIS — M5416 Radiculopathy, lumbar region: Secondary | ICD-10-CM | POA: Diagnosis not present

## 2015-12-08 DIAGNOSIS — G4733 Obstructive sleep apnea (adult) (pediatric): Secondary | ICD-10-CM | POA: Diagnosis not present

## 2015-12-08 DIAGNOSIS — I10 Essential (primary) hypertension: Secondary | ICD-10-CM | POA: Diagnosis not present

## 2015-12-16 ENCOUNTER — Telehealth: Payer: Self-pay | Admitting: *Deleted

## 2015-12-16 NOTE — Telephone Encounter (Signed)
IMPRESSION: No mammographic evidence of malignancy. A result letter of this screening mammogram will be mailed directly to the patient.  RECOMMENDATION: Screening mammogram in one year.  Call placed to patient and patient made aware.

## 2015-12-16 NOTE — Telephone Encounter (Signed)
Patient called and is inquiring about her mammogram results. Patient is requesting a call back . Her number is [phone]. Please advise. Thank you .

## 2016-01-05 DIAGNOSIS — G479 Sleep disorder, unspecified: Secondary | ICD-10-CM | POA: Diagnosis not present

## 2016-01-05 DIAGNOSIS — K219 Gastro-esophageal reflux disease without esophagitis: Secondary | ICD-10-CM | POA: Diagnosis not present

## 2016-01-05 DIAGNOSIS — G4733 Obstructive sleep apnea (adult) (pediatric): Secondary | ICD-10-CM | POA: Diagnosis not present

## 2016-01-05 DIAGNOSIS — M545 Low back pain: Secondary | ICD-10-CM | POA: Diagnosis not present

## 2016-01-05 DIAGNOSIS — M461 Sacroiliitis, not elsewhere classified: Secondary | ICD-10-CM | POA: Diagnosis not present

## 2016-01-05 DIAGNOSIS — M542 Cervicalgia: Secondary | ICD-10-CM | POA: Diagnosis not present

## 2016-01-05 DIAGNOSIS — M5416 Radiculopathy, lumbar region: Secondary | ICD-10-CM | POA: Diagnosis not present

## 2016-01-05 DIAGNOSIS — I10 Essential (primary) hypertension: Secondary | ICD-10-CM | POA: Diagnosis not present

## 2016-01-05 DIAGNOSIS — J449 Chronic obstructive pulmonary disease, unspecified: Secondary | ICD-10-CM | POA: Diagnosis not present

## 2016-01-19 ENCOUNTER — Other Ambulatory Visit: Payer: Self-pay | Admitting: Family Medicine

## 2016-01-21 ENCOUNTER — Other Ambulatory Visit: Payer: Self-pay | Admitting: Family Medicine

## 2016-01-24 ENCOUNTER — Encounter: Payer: Self-pay | Admitting: Family Medicine

## 2016-01-24 ENCOUNTER — Ambulatory Visit (INDEPENDENT_AMBULATORY_CARE_PROVIDER_SITE_OTHER): Payer: Commercial Managed Care - HMO | Admitting: Family Medicine

## 2016-01-24 VITALS — BP 144/66 | HR 80 | Temp 98.4°F | Resp 18 | Ht 65.0 in | Wt 199.0 lb

## 2016-01-24 DIAGNOSIS — I10 Essential (primary) hypertension: Secondary | ICD-10-CM | POA: Diagnosis not present

## 2016-01-24 DIAGNOSIS — Z720 Tobacco use: Secondary | ICD-10-CM | POA: Diagnosis not present

## 2016-01-24 DIAGNOSIS — Z6832 Body mass index (BMI) 32.0-32.9, adult: Secondary | ICD-10-CM | POA: Diagnosis not present

## 2016-01-24 DIAGNOSIS — E6609 Other obesity due to excess calories: Secondary | ICD-10-CM

## 2016-01-24 DIAGNOSIS — R7303 Prediabetes: Secondary | ICD-10-CM

## 2016-01-24 DIAGNOSIS — F5104 Psychophysiologic insomnia: Secondary | ICD-10-CM

## 2016-01-24 DIAGNOSIS — Z0000 Encounter for general adult medical examination without abnormal findings: Secondary | ICD-10-CM

## 2016-01-24 DIAGNOSIS — F418 Other specified anxiety disorders: Secondary | ICD-10-CM

## 2016-01-24 NOTE — Assessment & Plan Note (Signed)
Discussed continued healthy eating changes in diet. She is borderline diabetic recheck A1c today. She is starting to be little more active. A goal weight would be around 180 pounds

## 2016-01-24 NOTE — Telephone Encounter (Signed)
Medication refilled per protocol. 

## 2016-01-24 NOTE — Assessment & Plan Note (Signed)
Blood pressure looks okay no change in medication today. She will continue to monitor

## 2016-01-24 NOTE — Assessment & Plan Note (Addendum)
She declines taking any further medication. She will continue the trazodone 150 mg at bedtime She is also on gabapentin which is for pain but also found to help with anxiety

## 2016-01-24 NOTE — Progress Notes (Signed)
Subjective:   Patient presents for Medicare Annual/Subsequent preventive examination.   A she here for annual wellness exam Medications reviewed Seen in ED on 9/19 dx with Constipation, placed on Miralax , started BID, now on daily and Bowels movements are softner  Her weight is down 7lbs since our visit in August   HTN- on norvasc- at home runs 120-130's/ 60-70's , highest 142 at home  Depression/anxieyt/Insomnia- not on lexapro on trazodone , didn't feel like we needed the lexapro   Needs referral to dermatology ,for spot on hip and face   Review Past Medical/Family/Social: per EMR    Risk Factors  Current exercise habits: none  Dietary issues discussed: yes ,cutting back soda, now drinks Sprite instead of SunDrop, not eating out as much, walking dog   Cardiac risk factors: Obesity (BMI >= 30 kg/m2). HTN  Pre diabetes   Depression Screen  (Note: if answer to either of the following is "Yes", a more complete depression screening is indicated)  Over the past two weeks, have you felt down, depressed or hopeless? yes Over the past two weeks, have you felt little interest or pleasure in doing things? No Have you lost interest or pleasure in daily life? No Do you often feel hopeless? No Do you cry easily over simple problems? No   Activities of Daily Living  In your present state of health, do you have any difficulty performing the following activities?:  Driving? No  Managing money? No  Feeding yourself? No  Getting from bed to chair? No  Climbing a flight of stairs? yes  Preparing food and eating?: No  Bathing or showering? No  Getting dressed: No  Getting to the toilet? No  Using the toilet:No  Moving around from place to place: No  In the past year have you fallen or had a near fall?:No  Are you sexually active? No  Do you have more than one partner? No   Hearing Difficulties: No  Do you often ask people to speak up or repeat themselves? No  Do you experience ringing  or noises in your ears? No Do you have difficulty understanding soft or whispered voices? No  Do you feel that you have a problem with memory? No Do you often misplace items? No  Do you feel safe at home? Yes  Cognitive Testing  Alert? Yes Normal Appearance?Yes  Oriented to person? Yes Place? Yes  Time? Yes  Recall of three objects? Yes  Can perform simple calculations? Yes  Displays appropriate judgment?Yes  Can read the correct time from a watch face?Yes   List the Names of Other Physician/Practitioners you currently use: Pain clinic    Screening Tests / Date Colonoscopy    UTD                 Zostavax  -Due  Mammogram  UTD  Influenza Vaccine  Tetanus/tdap UTD   ROS: GEN- denies fatigue, fever, weight loss,weakness, recent illness HEENT- denies eye drainage, change in vision, nasal discharge, CVS- denies chest pain, palpitations RESP- denies SOB, cough, wheeze ABD- denies N/V, change in stools, abd pain GU- denies dysuria, hematuria, dribbling, incontinence MSK- + joint pain, muscle aches, injury Neuro- denies headache, dizziness, syncope, seizure activity  PHYSICAL GEN- NAD, alert and oriented x3 HEENT- PERRL, EOMI, non injected sclera, pink conjunctiva, MMM, oropharynx clear Neck- Supple, no thryomegaly CVS- RRR, no murmur RESP-CTAB ABD-NABS, Soft, NT,ND Skin- hyperpigmented scaley lesion left cheek, small pustule below right lower eyelid, left hip- hyperpigmented  irregular shaped scaley lesion, with scab at center ,NT  EXT- No edema Pulses- Radial, DP- 2+     Assessment:    Annual wellness medicare exam   Plan:    During the course of the visit the patient was educated and counseled about appropriate screening and preventive services including:  Declines flu/shinlges  Continue to work on dietary changes  .  Diet review for nutrition referral? Yes ____ Not Indicated __x__  Patient Instructions (the written plan) was given to the patient.  Medicare  Attestation  I have personally reviewed:  The patient's medical and social history  Their use of alcohol, tobacco or illicit drugs  Their current medications and supplements  The patient's functional ability including ADLs,fall risks, home safety risks, cognitive, and hearing and visual impairment  Diet and physical activities  Evidence for depression or mood disorders  The patient's weight, height, BMI, and visual acuity have been recorded in the chart. I have made referrals, counseling, and provided education to the patient based on review of the above and I have provided the patient with a written personalized care plan for preventive services.

## 2016-01-24 NOTE — Patient Instructions (Addendum)
Take the calcium/Vitamin D 1200mg / 1000IU I recommend eye visit once a year I recommend dental visit every 6 months Goal is to  Exercise 30 minutes 5 days a week We will send a letter with lab results  Referral to Dr. Tarri Glenn for skin  F/U 4 months

## 2016-01-25 LAB — CBC WITH DIFFERENTIAL/PLATELET
Basophils Absolute: 80 cells/uL (ref 0–200)
Basophils Relative: 1 %
Eosinophils Absolute: 160 cells/uL (ref 15–500)
Eosinophils Relative: 2 %
HCT: 41.9 % (ref 35.0–45.0)
Hemoglobin: 13.7 g/dL (ref 12.0–15.0)
Lymphocytes Relative: 17 %
Lymphs Abs: 1360 cells/uL (ref 850–3900)
MCH: 30.8 pg (ref 27.0–33.0)
MCHC: 32.7 g/dL (ref 32.0–36.0)
MCV: 94.2 fL (ref 80.0–100.0)
MPV: 10.6 fL (ref 7.5–12.5)
Monocytes Absolute: 560 cells/uL (ref 200–950)
Monocytes Relative: 7 %
Neutro Abs: 5840 cells/uL (ref 1500–7800)
Neutrophils Relative %: 73 %
Platelets: 326 10*3/uL (ref 140–400)
RBC: 4.45 MIL/uL (ref 3.80–5.10)
RDW: 14.9 % (ref 11.0–15.0)
WBC: 8 10*3/uL (ref 3.8–10.8)

## 2016-01-25 LAB — COMPREHENSIVE METABOLIC PANEL
ALT: 7 U/L (ref 6–29)
AST: 10 U/L (ref 10–35)
Albumin: 3.7 g/dL (ref 3.6–5.1)
Alkaline Phosphatase: 66 U/L (ref 33–130)
BUN: 13 mg/dL (ref 7–25)
CO2: 32 mmol/L — ABNORMAL HIGH (ref 20–31)
Calcium: 9.3 mg/dL (ref 8.6–10.4)
Chloride: 103 mmol/L (ref 98–110)
Creat: 0.93 mg/dL (ref 0.50–0.99)
Glucose, Bld: 100 mg/dL — ABNORMAL HIGH (ref 70–99)
Potassium: 4.4 mmol/L (ref 3.5–5.3)
Sodium: 139 mmol/L (ref 135–146)
Total Bilirubin: 0.2 mg/dL (ref 0.2–1.2)
Total Protein: 6.5 g/dL (ref 6.1–8.1)

## 2016-01-25 LAB — HEMOGLOBIN A1C
Hgb A1c MFr Bld: 5.6 % (ref <5.7)
Mean Plasma Glucose: 114 mg/dL

## 2016-01-25 LAB — LIPID PANEL
Cholesterol: 131 mg/dL (ref <200)
HDL: 53 mg/dL (ref >50)
LDL Cholesterol: 62 mg/dL
Total CHOL/HDL Ratio: 2.5 Ratio (ref <5.0)
Triglycerides: 79 mg/dL (ref <150)
VLDL: 16 mg/dL (ref <30)

## 2016-01-27 ENCOUNTER — Encounter: Payer: Self-pay | Admitting: *Deleted

## 2016-02-02 ENCOUNTER — Other Ambulatory Visit: Payer: Self-pay | Admitting: Family Medicine

## 2016-02-16 DIAGNOSIS — M542 Cervicalgia: Secondary | ICD-10-CM | POA: Diagnosis not present

## 2016-02-16 DIAGNOSIS — M461 Sacroiliitis, not elsewhere classified: Secondary | ICD-10-CM | POA: Diagnosis not present

## 2016-02-16 DIAGNOSIS — I10 Essential (primary) hypertension: Secondary | ICD-10-CM | POA: Diagnosis not present

## 2016-02-16 DIAGNOSIS — Z79891 Long term (current) use of opiate analgesic: Secondary | ICD-10-CM | POA: Diagnosis not present

## 2016-02-16 DIAGNOSIS — M545 Low back pain: Secondary | ICD-10-CM | POA: Diagnosis not present

## 2016-02-16 DIAGNOSIS — G479 Sleep disorder, unspecified: Secondary | ICD-10-CM | POA: Diagnosis not present

## 2016-02-16 DIAGNOSIS — M5416 Radiculopathy, lumbar region: Secondary | ICD-10-CM | POA: Diagnosis not present

## 2016-02-16 DIAGNOSIS — J449 Chronic obstructive pulmonary disease, unspecified: Secondary | ICD-10-CM | POA: Diagnosis not present

## 2016-02-16 DIAGNOSIS — G4733 Obstructive sleep apnea (adult) (pediatric): Secondary | ICD-10-CM | POA: Diagnosis not present

## 2016-02-16 DIAGNOSIS — K219 Gastro-esophageal reflux disease without esophagitis: Secondary | ICD-10-CM | POA: Diagnosis not present

## 2016-02-29 ENCOUNTER — Telehealth: Payer: Self-pay | Admitting: Family Medicine

## 2016-02-29 NOTE — Telephone Encounter (Signed)
Pt currently seeing Dr [name] in [place] for Pain management.  Next appt 03/29/16, needs to update [employer] ref.  [employer] Auth # [id number] for 6 visits from 03/29/16 - 09/25/16 Dx M54.5 LBP, R25.2 cramp/spasm and R41.3 other amnesia

## 2016-03-28 ENCOUNTER — Other Ambulatory Visit: Payer: Self-pay | Admitting: Family Medicine

## 2016-03-29 DIAGNOSIS — R252 Cramp and spasm: Secondary | ICD-10-CM | POA: Diagnosis not present

## 2016-03-29 DIAGNOSIS — M461 Sacroiliitis, not elsewhere classified: Secondary | ICD-10-CM | POA: Diagnosis not present

## 2016-03-29 DIAGNOSIS — Z79891 Long term (current) use of opiate analgesic: Secondary | ICD-10-CM | POA: Diagnosis not present

## 2016-03-29 DIAGNOSIS — J449 Chronic obstructive pulmonary disease, unspecified: Secondary | ICD-10-CM | POA: Diagnosis not present

## 2016-03-29 DIAGNOSIS — M542 Cervicalgia: Secondary | ICD-10-CM | POA: Diagnosis not present

## 2016-03-29 DIAGNOSIS — I10 Essential (primary) hypertension: Secondary | ICD-10-CM | POA: Diagnosis not present

## 2016-03-29 DIAGNOSIS — M545 Low back pain: Secondary | ICD-10-CM | POA: Diagnosis not present

## 2016-03-29 DIAGNOSIS — G4733 Obstructive sleep apnea (adult) (pediatric): Secondary | ICD-10-CM | POA: Diagnosis not present

## 2016-03-29 DIAGNOSIS — M5416 Radiculopathy, lumbar region: Secondary | ICD-10-CM | POA: Diagnosis not present

## 2016-04-01 ENCOUNTER — Other Ambulatory Visit: Payer: Self-pay | Admitting: Family Medicine

## 2016-04-07 ENCOUNTER — Other Ambulatory Visit: Payer: Self-pay | Admitting: Family Medicine

## 2016-04-11 ENCOUNTER — Other Ambulatory Visit: Payer: Self-pay | Admitting: Family Medicine

## 2016-04-11 DIAGNOSIS — L989 Disorder of the skin and subcutaneous tissue, unspecified: Secondary | ICD-10-CM

## 2016-04-11 DIAGNOSIS — D489 Neoplasm of uncertain behavior, unspecified: Secondary | ICD-10-CM

## 2016-04-11 NOTE — Progress Notes (Signed)
Pt has lesion on face and buttocks needs referral to dematology again

## 2016-05-29 ENCOUNTER — Ambulatory Visit: Payer: Commercial Managed Care - HMO | Admitting: Family Medicine

## 2016-05-30 DIAGNOSIS — R252 Cramp and spasm: Secondary | ICD-10-CM | POA: Diagnosis not present

## 2016-05-30 DIAGNOSIS — M545 Low back pain: Secondary | ICD-10-CM | POA: Diagnosis not present

## 2016-05-30 DIAGNOSIS — M5416 Radiculopathy, lumbar region: Secondary | ICD-10-CM | POA: Diagnosis not present

## 2016-05-30 DIAGNOSIS — G4733 Obstructive sleep apnea (adult) (pediatric): Secondary | ICD-10-CM | POA: Diagnosis not present

## 2016-05-30 DIAGNOSIS — M542 Cervicalgia: Secondary | ICD-10-CM | POA: Diagnosis not present

## 2016-05-30 DIAGNOSIS — Z79891 Long term (current) use of opiate analgesic: Secondary | ICD-10-CM | POA: Diagnosis not present

## 2016-05-30 DIAGNOSIS — J449 Chronic obstructive pulmonary disease, unspecified: Secondary | ICD-10-CM | POA: Diagnosis not present

## 2016-05-30 DIAGNOSIS — I10 Essential (primary) hypertension: Secondary | ICD-10-CM | POA: Diagnosis not present

## 2016-05-30 DIAGNOSIS — M461 Sacroiliitis, not elsewhere classified: Secondary | ICD-10-CM | POA: Diagnosis not present

## 2016-05-31 ENCOUNTER — Ambulatory Visit: Payer: Commercial Managed Care - HMO | Admitting: Family Medicine

## 2016-06-02 ENCOUNTER — Ambulatory Visit: Payer: Commercial Managed Care - HMO | Admitting: Family Medicine

## 2016-06-05 ENCOUNTER — Other Ambulatory Visit: Payer: Self-pay | Admitting: Family Medicine

## 2016-08-23 ENCOUNTER — Other Ambulatory Visit: Payer: Self-pay | Admitting: *Deleted

## 2016-08-23 MED ORDER — ALBUTEROL SULFATE (2.5 MG/3ML) 0.083% IN NEBU: 2.5000 mg | INHALATION_SOLUTION | RESPIRATORY_TRACT | 1 refills | Status: DC | PRN

## 2016-09-02 ENCOUNTER — Other Ambulatory Visit: Payer: Self-pay | Admitting: Family Medicine

## 2016-09-13 ENCOUNTER — Other Ambulatory Visit: Payer: Self-pay | Admitting: Family Medicine

## 2016-09-16 ENCOUNTER — Other Ambulatory Visit: Payer: Self-pay | Admitting: Family Medicine

## 2016-10-23 ENCOUNTER — Ambulatory Visit (INDEPENDENT_AMBULATORY_CARE_PROVIDER_SITE_OTHER): Payer: Medicare HMO | Admitting: Family Medicine

## 2016-10-23 ENCOUNTER — Encounter: Payer: Self-pay | Admitting: Family Medicine

## 2016-10-23 VITALS — BP 138/78 | HR 72 | Temp 98.4°F | Resp 14 | Ht 65.0 in | Wt 195.0 lb

## 2016-10-23 DIAGNOSIS — Z720 Tobacco use: Secondary | ICD-10-CM | POA: Diagnosis not present

## 2016-10-23 DIAGNOSIS — F5104 Psychophysiologic insomnia: Secondary | ICD-10-CM | POA: Diagnosis not present

## 2016-10-23 DIAGNOSIS — I10 Essential (primary) hypertension: Secondary | ICD-10-CM

## 2016-10-23 DIAGNOSIS — L84 Corns and callosities: Secondary | ICD-10-CM

## 2016-10-23 DIAGNOSIS — M21619 Bunion of unspecified foot: Secondary | ICD-10-CM | POA: Diagnosis not present

## 2016-10-23 DIAGNOSIS — M48062 Spinal stenosis, lumbar region with neurogenic claudication: Secondary | ICD-10-CM | POA: Diagnosis not present

## 2016-10-23 MED ORDER — DEXLANSOPRAZOLE 60 MG PO CPDR: 60.0000 mg | DELAYED_RELEASE_CAPSULE | Freq: Every day | ORAL | 3 refills | Status: DC

## 2016-10-23 MED ORDER — AMLODIPINE BESYLATE 10 MG PO TABS: 10.0000 mg | ORAL_TABLET | Freq: Every day | ORAL | 3 refills | Status: DC

## 2016-10-23 MED ORDER — GABAPENTIN 400 MG PO CAPS: 800.0000 mg | ORAL_CAPSULE | Freq: Every day | ORAL | 3 refills | Status: DC

## 2016-10-23 MED ORDER — TRAZODONE HCL 100 MG PO TABS: 200.0000 mg | ORAL_TABLET | Freq: Every day | ORAL | 2 refills | Status: DC

## 2016-10-23 NOTE — Assessment & Plan Note (Signed)
Very well-controlled with Norvasc reiterated importance of dietary changes and weight loss she is down about 5 pounds

## 2016-10-23 NOTE — Patient Instructions (Addendum)
Referral to Podiatry  Continue current medications F/U 4 MONTHS Physical

## 2016-10-23 NOTE — Progress Notes (Signed)
   Subjective:    Patient ID: Paula Long, female    DOB: 16-Jul-1950, 66 y.o.   MRN: [id number]  Patient presents for 4 month F/U (is fasting)  Pt here to f/u chronic medical problems.  COPD- uses albuterol rarely, feels like breathing is okay , cointinues to smoke 1-2ppd , she admits He does not want to quit smoking  Insomnia- uses for sleep , still feels like she needs more on the trazodone she does not feel depressed or anxious but still has trouble staying asleep   Chronic pain- taking gabapentin at bedtime   HTN- taking norvasc without side effects   GERD- taking dexilant    Has knots on top of toes, causes pain, has been present for "a white" getting worse, also has bunion on left foot that is causing more pain     Review Of Systems:  GEN- denies fatigue, fever, weight loss,weakness, recent illness HEENT- denies eye drainage, change in vision, nasal discharge, CVS- denies chest pain, palpitations RESP- denies SOB, cough, wheeze ABD- denies N/V, change in stools, abd pain GU- denies dysuria, hematuria, dribbling, incontinence MSK- + joint pain, muscle aches, injury Neuro- denies headache, dizziness, syncope, seizure activity       Objective:    BP 138/78   Pulse 72   Temp 98.4 F (36.9 C) (Oral)   Resp 14   Ht 5\' 5"  (1.651 m)   Wt 195 lb (88.5 kg)   SpO2 98%   BMI 32.45 kg/m  GEN- NAD, alert and oriented x3 HEENT- PERRL, EOMI, non injected sclera, pink conjunctiva, MMM, oropharynx clear Neck- Supple, no thyromegaly CVS- RRR, no murmur RESP-CTAB ABD-NABS,soft,NT,ND  EXT- No edema ,left great toe bunion, with rubbing onto 2nd digit, mild corns top of 2-5th digits, preulcerative callus beneath 5th digit TTP on left foot, long nials Psych- normal affect and mood Pulses- Radial, DP- 2+        Assessment & Plan:      Problem List Items Addressed This Visit    Tobacco user - Primary    She does not want to quit smoking at this time.      Spinal stenosis, lumbar region, with neurogenic claudication    Continue gabapentin      Essential hypertension, benign    Very well-controlled with Norvasc reiterated importance of dietary changes and weight loss she is down about 5 pounds      Relevant Medications   amLODipine (NORVASC) 10 MG tablet   Other Relevant Orders   CBC with Differential/Platelet   Comprehensive metabolic panel   Lipid panel   Chronic insomnia    Increase trazodone to 200mg  daily       Bunion of great toe    Referral to podiatry for treatment of bunion which is now causing foot deformity as well as some corns and she is putting pressure chronically over a pre-ulcerative callus      Relevant Orders   Ambulatory referral to Podiatry    Other Visit Diagnoses    Corns/callosities       Relevant Orders   Ambulatory referral to Podiatry      Note: This dictation was prepared with Dragon dictation along with smaller phrase technology. Any transcriptional errors that result from this process are unintentional.

## 2016-10-23 NOTE — Assessment & Plan Note (Signed)
Increase trazodone to 200mg  daily

## 2016-10-23 NOTE — Assessment & Plan Note (Signed)
Continue gabapentin.

## 2016-10-23 NOTE — Assessment & Plan Note (Signed)
Referral to podiatry for treatment of bunion which is now causing foot deformity as well as some corns and she is putting pressure chronically over a pre-ulcerative callus

## 2016-10-23 NOTE — Assessment & Plan Note (Signed)
She does not want to quit smoking at this time.

## 2016-10-24 LAB — COMPREHENSIVE METABOLIC PANEL
ALT: 8 U/L (ref 6–29)
AST: 11 U/L (ref 10–35)
Albumin: 3.7 g/dL (ref 3.6–5.1)
Alkaline Phosphatase: 69 U/L (ref 33–130)
BUN: 17 mg/dL (ref 7–25)
CO2: 27 mmol/L (ref 20–32)
Calcium: 9.2 mg/dL (ref 8.6–10.4)
Chloride: 102 mmol/L (ref 98–110)
Creat: 0.97 mg/dL (ref 0.50–0.99)
Glucose, Bld: 91 mg/dL (ref 70–99)
Potassium: 4.3 mmol/L (ref 3.5–5.3)
Sodium: 136 mmol/L (ref 135–146)
Total Bilirubin: 0.2 mg/dL (ref 0.2–1.2)
Total Protein: 6.4 g/dL (ref 6.1–8.1)

## 2016-10-24 LAB — CBC WITH DIFFERENTIAL/PLATELET
Basophils Absolute: 0 cells/uL (ref 0–200)
Basophils Relative: 0 %
Eosinophils Absolute: 76 cells/uL (ref 15–500)
Eosinophils Relative: 1 %
HCT: 41.9 % (ref 35.0–45.0)
Hemoglobin: 13.7 g/dL (ref 12.0–15.0)
Lymphocytes Relative: 23 %
Lymphs Abs: 1748 cells/uL (ref 850–3900)
MCH: 30.8 pg (ref 27.0–33.0)
MCHC: 32.7 g/dL (ref 32.0–36.0)
MCV: 94.2 fL (ref 80.0–100.0)
MPV: 10.2 fL (ref 7.5–12.5)
Monocytes Absolute: 380 cells/uL (ref 200–950)
Monocytes Relative: 5 %
Neutro Abs: 5396 cells/uL (ref 1500–7800)
Neutrophils Relative %: 71 %
Platelets: 316 10*3/uL (ref 140–400)
RBC: 4.45 MIL/uL (ref 3.80–5.10)
RDW: 15.1 % — ABNORMAL HIGH (ref 11.0–15.0)
WBC: 7.6 10*3/uL (ref 3.8–10.8)

## 2016-10-24 LAB — LIPID PANEL
Cholesterol: 121 mg/dL (ref <200)
HDL: 53 mg/dL (ref >50)
LDL Cholesterol: 55 mg/dL (ref <100)
Total CHOL/HDL Ratio: 2.3 Ratio (ref <5.0)
Triglycerides: 64 mg/dL (ref <150)
VLDL: 13 mg/dL (ref <30)

## 2016-10-26 ENCOUNTER — Encounter: Payer: Self-pay | Admitting: *Deleted

## 2016-11-02 ENCOUNTER — Other Ambulatory Visit: Payer: Self-pay | Admitting: Family Medicine

## 2016-11-02 DIAGNOSIS — Z1231 Encounter for screening mammogram for malignant neoplasm of breast: Secondary | ICD-10-CM

## 2016-11-29 ENCOUNTER — Ambulatory Visit (HOSPITAL_COMMUNITY): Payer: Commercial Managed Care - HMO

## 2016-12-18 ENCOUNTER — Ambulatory Visit (HOSPITAL_COMMUNITY): Payer: Commercial Managed Care - HMO

## 2016-12-30 ENCOUNTER — Other Ambulatory Visit: Payer: Self-pay | Admitting: Family Medicine

## 2017-01-03 ENCOUNTER — Ambulatory Visit: Payer: Self-pay | Admitting: Family Medicine

## 2017-01-11 ENCOUNTER — Ambulatory Visit (HOSPITAL_COMMUNITY)
Admission: RE | Admit: 2017-01-11 | Discharge: 2017-01-11 | Disposition: A | Discharge status: Home / Self Care | Payer: Medicare HMO | Source: Ambulatory Visit | Attending: Family Medicine | Admitting: Family Medicine

## 2017-01-11 DIAGNOSIS — Z1231 Encounter for screening mammogram for malignant neoplasm of breast: Secondary | ICD-10-CM | POA: Diagnosis not present

## 2017-02-13 ENCOUNTER — Telehealth: Payer: Self-pay | Admitting: Family Medicine

## 2017-02-13 DIAGNOSIS — G8929 Other chronic pain: Secondary | ICD-10-CM

## 2017-02-13 DIAGNOSIS — M545 Low back pain: Principal | ICD-10-CM

## 2017-02-13 NOTE — Telephone Encounter (Signed)
Pt already est with Dr [name] for her low back pain.  Calling to state needs updated referral.  Has appt 02/19/17.

## 2017-02-19 DIAGNOSIS — Z79899 Other long term (current) drug therapy: Secondary | ICD-10-CM | POA: Diagnosis not present

## 2017-02-19 DIAGNOSIS — M461 Sacroiliitis, not elsewhere classified: Secondary | ICD-10-CM | POA: Diagnosis not present

## 2017-02-19 DIAGNOSIS — M545 Low back pain: Secondary | ICD-10-CM | POA: Diagnosis not present

## 2017-02-19 DIAGNOSIS — M542 Cervicalgia: Secondary | ICD-10-CM | POA: Diagnosis not present

## 2017-02-23 ENCOUNTER — Other Ambulatory Visit: Payer: Self-pay

## 2017-02-23 ENCOUNTER — Encounter: Payer: Self-pay | Admitting: Family Medicine

## 2017-02-23 ENCOUNTER — Ambulatory Visit (INDEPENDENT_AMBULATORY_CARE_PROVIDER_SITE_OTHER): Payer: Medicare HMO | Admitting: Family Medicine

## 2017-02-23 VITALS — BP 130/72 | HR 62 | Temp 97.3°F | Resp 16 | Ht 65.0 in | Wt 201.0 lb

## 2017-02-23 DIAGNOSIS — Z6833 Body mass index (BMI) 33.0-33.9, adult: Secondary | ICD-10-CM | POA: Diagnosis not present

## 2017-02-23 DIAGNOSIS — E6609 Other obesity due to excess calories: Secondary | ICD-10-CM

## 2017-02-23 DIAGNOSIS — Z0000 Encounter for general adult medical examination without abnormal findings: Secondary | ICD-10-CM | POA: Diagnosis not present

## 2017-02-23 DIAGNOSIS — J418 Mixed simple and mucopurulent chronic bronchitis: Secondary | ICD-10-CM | POA: Diagnosis not present

## 2017-02-23 DIAGNOSIS — F418 Other specified anxiety disorders: Secondary | ICD-10-CM

## 2017-02-23 DIAGNOSIS — Z13820 Encounter for screening for osteoporosis: Secondary | ICD-10-CM | POA: Diagnosis not present

## 2017-02-23 DIAGNOSIS — Z720 Tobacco use: Secondary | ICD-10-CM | POA: Diagnosis not present

## 2017-02-23 DIAGNOSIS — I10 Essential (primary) hypertension: Secondary | ICD-10-CM | POA: Diagnosis not present

## 2017-02-23 DIAGNOSIS — Z23 Encounter for immunization: Secondary | ICD-10-CM | POA: Diagnosis not present

## 2017-02-23 DIAGNOSIS — R7303 Prediabetes: Secondary | ICD-10-CM

## 2017-02-23 MED ORDER — MELOXICAM 15 MG PO TABS: 15.0000 mg | ORAL_TABLET | Freq: Every day | ORAL | 0 refills | Status: DC

## 2017-02-23 MED ORDER — FLUTICASONE FUROATE-VILANTEROL 100-25 MCG/INH IN AEPB: 1.0000 | INHALATION_SPRAY | Freq: Every day | RESPIRATORY_TRACT | 0 refills | Status: DC

## 2017-02-23 MED ORDER — TRAZODONE HCL 150 MG PO TABS: 150.0000 mg | ORAL_TABLET | Freq: Every day | ORAL | 3 refills | Status: DC

## 2017-02-23 NOTE — Progress Notes (Signed)
Subjective:   Patient presents for Medicare Annual/Subsequent preventive examination.   Review Past Medical/Family/Social: Per EMR  COPD- has some coughing and seen episodes.  Her breathing is worse at night.  She does use her albuterol inhaler.  Her sister states that her breathing is funny at night.  She states that she had a sleep study done by her pain physician a few years ago they told her that she did not have sleep apnea  Chronic pain- continues with Dr. Sampson Goon, given Mobic recently for right sided pain, walking has improved  Holding off on podiatr y due to cost     Depression- phq9 score 6 , feels 200mg  is too muchfor her, wants to go back to 150mg , falling asleep during the day   Risk Factors  Current exercise habits: none  Dietary issues discussed: Yes  Cardiac risk factors: Obesity (BMI >= 30 kg/m2). HTN, pre diabetes  Depression Screen  (Note: if answer to either of the following is "Yes", a more complete depression screening is indicated)  Over the past two weeks, have you felt down, depressed or hopeless? No Over the past two weeks, have you felt little interest or pleasure in doing things? No Have you lost interest or pleasure in daily life? No Do you often feel hopeless? No Do you cry easily over simple problems? No   Activities of Daily Living  In your present state of health, do you have any difficulty performing the following activities?:  Driving? No  Managing money? No  Feeding yourself? No  Getting from bed to chair? No  Climbing a flight of stairs? yes Preparing food and eating?: No  Bathing or showering? No  Getting dressed: No  Getting to the toilet? no Using the toilet:yes Moving around from place to place: yes  In the past year have you fallen or had a near fall?:yes Are you sexually active? No  Do you have more than one partner? No   Hearing Difficulties: No  Do you often ask people to speak up or repeat themselves? No  Do you experience  ringing or noises in your ears? No Do you have difficulty understanding soft or whispered voices? No  Do you feel that you have a problem with memory? yes Do you often misplace items? yes Do you feel safe at home? Yes  Cognitive Testing  Alert? Yes Normal Appearance?Yes  Oriented to person? Yes Place? Yes  Time? Yes  Recall of three objects? Yes  Can perform simple calculations? Yes  Displays appropriate judgment?Yes  Can read the correct time from a watch face?Yes   List the Names of Other Physician/Practitioners you currently use:   Pain clinic Dr. Tarri Glenn Dermaotology - has appt 29th of Jan    Screening Tests / Date Colonoscopy        UTD             Zostavax declines Mammogram UTD Pneumonia vaccine- due for prevnar 13  Influenza Vaccine  Refuses Tetanus/tdap declines  ROS: GEN- denies fatigue, fever, weight loss,weakness, recent illness HEENT- denies eye drainage, change in vision, nasal discharge, CVS- denies chest pain, palpitations RESP- denies SOB, cough, wheeze ABD- denies N/V, change in stools, abd pain GU- denies dysuria, hematuria, dribbling, incontinence MSK- + joint pain, muscle aches, injury Neuro- denies headache, dizziness, syncope, seizure activity  PHYSICAL GEN- NAD, alert and oriented x3 HEENT- PERRL, EOMI, non injected sclera, pink conjunctiva, MMM, oropharynx clear, TM clear bilat, no effusion, nares clear  Neck- Supple, no  thryomegaly CVS- RRR, no murmur RESP-CTAB ABD-NABS,soft,NT,ND Psych- normal affect and mood  EXT- No edema Pulses- Radial, DP- 2+      Assessment:    Annual wellness medicare exam   Plan:    During the course of the visit the patient was educated and counseled about appropriate screening and preventive services including:  Bone Density to be set up    MDD/insomnia- pt feels over medication, trazodone decreased to  150mg  once a day    Discussed advanced directives- given handouts   Obesity- she has glucose  intolerance, check A1C, discussed diet, high carb diet, needs to reduce  Smoking- she does not want to quit  COPD- trial of Breo , given samples to reduce rescue inhaler use   ? Sleep apnea obtain records from her neurologist first    Diet review for nutrition referral? Yes ____ Not Indicated __x__   CAN NOT AFFORD Patient Instructions (the written plan) was given to the patient.  Medicare Attestation  I have personally reviewed:  The patient's medical and social history  Their use of alcohol, tobacco or illicit drugs  Their current medications and supplements  The patient's functional ability including ADLs,fall risks, home safety risks, cognitive, and hearing and visual impairment  Diet and physical activities  Evidence for depression or mood disorders  The patient's weight, height, BMI, and visual acuity have been recorded in the chart. I have made referrals, counseling, and provided education to the patient based on review of the above and I have provided the patient with a written personalized care plan for preventive services.

## 2017-02-23 NOTE — Patient Instructions (Addendum)
Decrease trazodone to 150mg  at bedtime Try the Breo 1 puff daily pnemonia vaccine given We will call with lab results F/U 6 months

## 2017-02-24 LAB — COMPREHENSIVE METABOLIC PANEL
AG Ratio: 1.3 (calc) (ref 1.0–2.5)
ALT: 9 U/L (ref 6–29)
AST: 13 U/L (ref 10–35)
Albumin: 4 g/dL (ref 3.6–5.1)
Alkaline phosphatase (APISO): 66 U/L (ref 33–130)
BUN/Creatinine Ratio: 14 (calc) (ref 6–22)
BUN: 17 mg/dL (ref 7–25)
CO2: 28 mmol/L (ref 20–32)
Calcium: 9.7 mg/dL (ref 8.6–10.4)
Chloride: 103 mmol/L (ref 98–110)
Creat: 1.25 mg/dL — ABNORMAL HIGH (ref 0.50–0.99)
Globulin: 3 g/dL (calc) (ref 1.9–3.7)
Glucose, Bld: 89 mg/dL (ref 65–99)
Potassium: 4.6 mmol/L (ref 3.5–5.3)
Sodium: 138 mmol/L (ref 135–146)
Total Bilirubin: 0.2 mg/dL (ref 0.2–1.2)
Total Protein: 7 g/dL (ref 6.1–8.1)

## 2017-02-24 LAB — CBC WITH DIFFERENTIAL/PLATELET
Basophils Absolute: 81 cells/uL (ref 0–200)
Basophils Relative: 1 %
Eosinophils Absolute: 81 cells/uL (ref 15–500)
Eosinophils Relative: 1 %
HCT: 41.3 % (ref 35.0–45.0)
Hemoglobin: 13.8 g/dL (ref 11.7–15.5)
Lymphs Abs: 2309 cells/uL (ref 850–3900)
MCH: 31 pg (ref 27.0–33.0)
MCHC: 33.4 g/dL (ref 32.0–36.0)
MCV: 92.8 fL (ref 80.0–100.0)
MPV: 11.2 fL (ref 7.5–12.5)
Monocytes Relative: 8.6 %
Neutro Abs: 4933 cells/uL (ref 1500–7800)
Neutrophils Relative %: 60.9 %
Platelets: 292 10*3/uL (ref 140–400)
RBC: 4.45 10*6/uL (ref 3.80–5.10)
RDW: 13.4 % (ref 11.0–15.0)
Total Lymphocyte: 28.5 %
WBC mixed population: 697 cells/uL (ref 200–950)
WBC: 8.1 10*3/uL (ref 3.8–10.8)

## 2017-02-24 LAB — HEMOGLOBIN A1C
Hgb A1c MFr Bld: 5.6 % of total Hgb (ref <5.7)
Mean Plasma Glucose: 114 (calc)
eAG (mmol/L): 6.3 (calc)

## 2017-03-01 ENCOUNTER — Encounter: Payer: Self-pay | Admitting: *Deleted

## 2017-03-02 ENCOUNTER — Encounter: Payer: Self-pay | Admitting: *Deleted

## 2017-03-06 ENCOUNTER — Ambulatory Visit (HOSPITAL_COMMUNITY)
Admission: RE | Admit: 2017-03-06 | Discharge: 2017-03-06 | Disposition: A | Discharge status: Home / Self Care | Payer: Medicare HMO | Source: Ambulatory Visit | Attending: Family Medicine | Admitting: Family Medicine

## 2017-03-06 DIAGNOSIS — Z780 Asymptomatic menopausal state: Secondary | ICD-10-CM | POA: Diagnosis not present

## 2017-03-06 DIAGNOSIS — M85851 Other specified disorders of bone density and structure, right thigh: Secondary | ICD-10-CM | POA: Insufficient documentation

## 2017-03-06 DIAGNOSIS — Z13820 Encounter for screening for osteoporosis: Secondary | ICD-10-CM | POA: Diagnosis not present

## 2017-03-12 ENCOUNTER — Encounter: Payer: Self-pay | Admitting: *Deleted

## 2017-03-27 ENCOUNTER — Telehealth: Payer: Self-pay | Admitting: Family Medicine

## 2017-03-27 NOTE — Telephone Encounter (Signed)
Patient returned call and made aware of results.

## 2017-03-27 NOTE — Telephone Encounter (Signed)
Patient never got the letter you sent regarding her bone density test please call her with these results  [phone]

## 2017-03-28 ENCOUNTER — Other Ambulatory Visit: Payer: Self-pay | Admitting: *Deleted

## 2017-03-28 MED ORDER — TRAZODONE HCL 150 MG PO TABS: 150.0000 mg | ORAL_TABLET | Freq: Every day | ORAL | 3 refills | Status: DC

## 2017-04-02 DIAGNOSIS — M461 Sacroiliitis, not elsewhere classified: Secondary | ICD-10-CM | POA: Diagnosis not present

## 2017-04-02 DIAGNOSIS — M545 Low back pain: Secondary | ICD-10-CM | POA: Diagnosis not present

## 2017-04-02 DIAGNOSIS — M542 Cervicalgia: Secondary | ICD-10-CM | POA: Diagnosis not present

## 2017-04-02 DIAGNOSIS — Z79899 Other long term (current) drug therapy: Secondary | ICD-10-CM | POA: Diagnosis not present

## 2017-04-17 DIAGNOSIS — D485 Neoplasm of uncertain behavior of skin: Secondary | ICD-10-CM | POA: Diagnosis not present

## 2017-04-17 DIAGNOSIS — L570 Actinic keratosis: Secondary | ICD-10-CM | POA: Diagnosis not present

## 2017-04-17 DIAGNOSIS — L821 Other seborrheic keratosis: Secondary | ICD-10-CM | POA: Diagnosis not present

## 2017-04-17 DIAGNOSIS — Z85828 Personal history of other malignant neoplasm of skin: Secondary | ICD-10-CM | POA: Diagnosis not present

## 2017-04-20 ENCOUNTER — Ambulatory Visit: Payer: Medicare HMO | Admitting: Family Medicine

## 2017-04-27 ENCOUNTER — Ambulatory Visit: Payer: Medicare HMO | Admitting: Family Medicine

## 2017-04-30 ENCOUNTER — Encounter: Payer: Self-pay | Admitting: Family Medicine

## 2017-04-30 ENCOUNTER — Ambulatory Visit (INDEPENDENT_AMBULATORY_CARE_PROVIDER_SITE_OTHER): Payer: Medicare HMO | Admitting: Family Medicine

## 2017-04-30 ENCOUNTER — Other Ambulatory Visit: Payer: Self-pay

## 2017-04-30 VITALS — BP 128/76 | HR 66 | Temp 97.9°F | Resp 14 | Ht 65.0 in | Wt 201.0 lb

## 2017-04-30 DIAGNOSIS — E6609 Other obesity due to excess calories: Secondary | ICD-10-CM | POA: Diagnosis not present

## 2017-04-30 DIAGNOSIS — Z6833 Body mass index (BMI) 33.0-33.9, adult: Secondary | ICD-10-CM | POA: Diagnosis not present

## 2017-04-30 DIAGNOSIS — R7303 Prediabetes: Secondary | ICD-10-CM | POA: Diagnosis not present

## 2017-04-30 MED ORDER — PHENTERMINE HCL 37.5 MG PO TABS: 37.5000 mg | ORAL_TABLET | Freq: Every day | ORAL | 1 refills | Status: DC

## 2017-04-30 NOTE — Patient Instructions (Signed)
Try the phentermine - take 1/2 tablet in the  Morning for 2 weeks, then increase to 1 tablet daily Black cohosh for hot flashes  F/U 2 months

## 2017-04-30 NOTE — Progress Notes (Signed)
   Subjective:    Patient ID: Paula Long, female    DOB: [birth year], 67 y.o.   MRN: [id number]  Patient presents for Discuss Weight Loss Medication   Has been trying to lose weight. She has been trying to eat more fruit, drink more water. She has tried to cut out her soda. Eats breakfast, small snack before lunch and eats dinner.  24 hour recall- scramble egg, 2 slices bacon, Soda Lunch- apple Dinner- sphagetti, Soda  This AM- Nabs  Wants to try weight loss medicine She gets little exercise due to chronic pain      Review Of Systems:  GEN- denies fatigue, fever, weight loss,weakness, recent illness HEENT- denies eye drainage, change in vision, nasal discharge, CVS- denies chest pain, palpitations RESP- denies SOB, cough, wheeze ABD- denies N/V, change in stools, abd pain GU- denies dysuria, hematuria, dribbling, incontinence MSK- denies joint pain, muscle aches, injury Neuro- denies headache, dizziness, syncope, seizure activity       Objective:    BP 128/76   Pulse 66   Temp 97.9 F (36.6 C) (Oral)   Resp 14   Ht 5\' 5"  (1.651 m)   Wt 201 lb (91.2 kg)   SpO2 98%   BMI 33.45 kg/m  GEN- NAD, alert and oriented x3 HEENT- PERRL, EOMI, non injected sclera, pink conjunctiva, MMM, oropharynx clear CVS- RRR, no murmur RESP-CTAB Ext- no edema        Assessment & Plan:      Problem List Items Addressed This Visit      Unprioritized   Prediabetes - Primary   Obesity    Discussed meal planning, portion control 3 meals a day, cut out the snacks, soda Given handout After discussion about possible use of victoza due to the borderline diabetes, she declined any injectable Will try phentermine if able to afford       Relevant Medications   phentermine (ADIPEX-P) 37.5 MG tablet      Note: This dictation was prepared with Dragon dictation along with smaller phrase technology. Any transcriptional errors that result from this process are unintentional.

## 2017-04-30 NOTE — Assessment & Plan Note (Signed)
Discussed meal planning, portion control 3 meals a day, cut out the snacks, soda Given handout After discussion about possible use of victoza due to the borderline diabetes, she declined any injectable Will try phentermine if able to afford

## 2017-05-01 DIAGNOSIS — M461 Sacroiliitis, not elsewhere classified: Secondary | ICD-10-CM | POA: Diagnosis not present

## 2017-05-01 DIAGNOSIS — M545 Low back pain: Secondary | ICD-10-CM | POA: Diagnosis not present

## 2017-05-01 DIAGNOSIS — G4714 Hypersomnia due to medical condition: Secondary | ICD-10-CM | POA: Diagnosis not present

## 2017-05-01 DIAGNOSIS — Z79891 Long term (current) use of opiate analgesic: Secondary | ICD-10-CM | POA: Diagnosis not present

## 2017-05-01 DIAGNOSIS — M542 Cervicalgia: Secondary | ICD-10-CM | POA: Diagnosis not present

## 2017-06-29 ENCOUNTER — Ambulatory Visit: Payer: Medicare HMO | Admitting: Family Medicine

## 2017-08-03 ENCOUNTER — Other Ambulatory Visit: Payer: Self-pay | Admitting: Family Medicine

## 2017-08-24 ENCOUNTER — Encounter: Payer: Self-pay | Admitting: Family Medicine

## 2017-08-24 ENCOUNTER — Other Ambulatory Visit: Payer: Self-pay

## 2017-08-24 ENCOUNTER — Ambulatory Visit (INDEPENDENT_AMBULATORY_CARE_PROVIDER_SITE_OTHER): Payer: Medicare HMO | Admitting: Family Medicine

## 2017-08-24 VITALS — BP 132/78 | HR 72 | Temp 98.1°F | Resp 14 | Ht 65.0 in | Wt 199.0 lb

## 2017-08-24 DIAGNOSIS — M545 Low back pain, unspecified: Secondary | ICD-10-CM

## 2017-08-24 DIAGNOSIS — R011 Cardiac murmur, unspecified: Secondary | ICD-10-CM | POA: Diagnosis not present

## 2017-08-24 DIAGNOSIS — Z720 Tobacco use: Secondary | ICD-10-CM

## 2017-08-24 DIAGNOSIS — I10 Essential (primary) hypertension: Secondary | ICD-10-CM | POA: Diagnosis not present

## 2017-08-24 DIAGNOSIS — J418 Mixed simple and mucopurulent chronic bronchitis: Secondary | ICD-10-CM

## 2017-08-24 DIAGNOSIS — G8929 Other chronic pain: Secondary | ICD-10-CM

## 2017-08-24 MED ORDER — IBUPROFEN 600 MG PO TABS: 600.0000 mg | ORAL_TABLET | Freq: Two times a day (BID) | ORAL | 1 refills | Status: DC | PRN

## 2017-08-24 NOTE — Assessment & Plan Note (Addendum)
Controlled off medications  Check renal function had mild renal insuffiency last check

## 2017-08-24 NOTE — Assessment & Plan Note (Signed)
Continues to smoke but no recent exacerbations  Continue breo

## 2017-08-24 NOTE — Assessment & Plan Note (Signed)
Declines going back to pain clinic, advised I will not fill any narcotics She will take ibuprofen BID as needed gabapentin

## 2017-08-24 NOTE — Assessment & Plan Note (Signed)
She does not want to quit

## 2017-08-24 NOTE — Patient Instructions (Addendum)
2D echo to be done  We will call with lab results  Take ibuprofen twice a day  F/U December for Physical

## 2017-08-24 NOTE — Progress Notes (Signed)
   Subjective:    Patient ID: Paula Long, female    DOB: December 15, 1950, 67 y.o.   MRN: [id number]  Patient presents for Follow-up (is not fasting)  Pt here to f/u chronic medical problems  Reviewed medications COPD- using breo as prescribed, albuterol prn, continues to smoke   Does not want to quit    Obesity- given phentermine back in Feb- weight was  201lbs then , never picked up medication due to cost   Borderline DM- but last 2 , A1C 5.6% have been normal    MDD/insomnia- trazodone decreased to 150mg  back in December   Chronic pain- currently on gabapentin , has not taken pain medicine in 3 months from pain clinic, states she does not want to go back  Wants to go on ibuprofen for pain   No new concerns   Review Of Systems:  GEN- denies fatigue, fever, weight loss,weakness, recent illness HEENT- denies eye drainage, change in vision, nasal discharge, CVS- denies chest pain, palpitations RESP- denies SOB, cough, wheeze ABD- denies N/V, change in stools, abd pain GU- denies dysuria, hematuria, dribbling, incontinence MSK- + joint pain, muscle aches, injury Neuro- denies headache, dizziness, syncope, seizure activity       Objective:    BP 132/78   Pulse 72   Temp 98.1 F (36.7 C) (Oral)   Resp 14   Ht 5\' 5"  (1.651 m)   Wt 199 lb (90.3 kg)   SpO2 95%   BMI 33.12 kg/m  GEN- NAD, alert and oriented x3 HEENT- PERRL, EOMI, non injected sclera, pink conjunctiva, MMM, oropharynx clear Neck- Supple, no thyromegaly CVS- RR3/6 RESP-CTAB ABD-NABS,soft,NT,ND EXT- No edema Pulses- Radial, DP- 2+        Assessment & Plan:      Problem List Items Addressed This Visit      Unprioritized   Chronic back pain    Declines going back to pain clinic, advised I will not fill any narcotics She will take ibuprofen BID as needed gabapentin      Relevant Medications   ibuprofen (ADVIL,MOTRIN) 600 MG tablet   COPD (chronic obstructive pulmonary disease) (HCC)   Continues to smoke but no recent exacerbations  Continue breo      Relevant Orders   ECHOCARDIOGRAM COMPLETE   Essential hypertension, benign - Primary    Controlled off medications  Check renal function had mild renal insuffiency last check       Relevant Orders   ECHOCARDIOGRAM COMPLETE   CBC with Differential/Platelet   Comprehensive metabolic panel   Tobacco user    She does not want to quit        Other Visit Diagnoses    Systolic murmur       heart murmur in smoker, HTN, COPD, obtain echo   Relevant Orders   ECHOCARDIOGRAM COMPLETE      Note: This dictation was prepared with Dragon dictation along with smaller phrase technology. Any transcriptional errors that result from this process are unintentional.

## 2017-08-25 LAB — CBC WITH DIFFERENTIAL/PLATELET
Basophils Absolute: 69 cells/uL (ref 0–200)
Basophils Relative: 0.9 %
Eosinophils Absolute: 92 cells/uL (ref 15–500)
Eosinophils Relative: 1.2 %
HCT: 41.2 % (ref 35.0–45.0)
Hemoglobin: 13.9 g/dL (ref 11.7–15.5)
Lymphs Abs: 2264 cells/uL (ref 850–3900)
MCH: 30.7 pg (ref 27.0–33.0)
MCHC: 33.7 g/dL (ref 32.0–36.0)
MCV: 90.9 fL (ref 80.0–100.0)
MPV: 11.6 fL (ref 7.5–12.5)
Monocytes Relative: 4.1 %
Neutro Abs: 4959 cells/uL (ref 1500–7800)
Neutrophils Relative %: 64.4 %
Platelets: 265 10*3/uL (ref 140–400)
RBC: 4.53 10*6/uL (ref 3.80–5.10)
RDW: 13.2 % (ref 11.0–15.0)
Total Lymphocyte: 29.4 %
WBC mixed population: 316 cells/uL (ref 200–950)
WBC: 7.7 10*3/uL (ref 3.8–10.8)

## 2017-08-25 LAB — COMPREHENSIVE METABOLIC PANEL
AG Ratio: 1.3 (calc) (ref 1.0–2.5)
ALT: 7 U/L (ref 6–29)
AST: 11 U/L (ref 10–35)
Albumin: 3.8 g/dL (ref 3.6–5.1)
Alkaline phosphatase (APISO): 74 U/L (ref 33–130)
BUN/Creatinine Ratio: 11 (calc) (ref 6–22)
BUN: 12 mg/dL (ref 7–25)
CO2: 31 mmol/L (ref 20–32)
Calcium: 9.3 mg/dL (ref 8.6–10.4)
Chloride: 98 mmol/L (ref 98–110)
Creat: 1.09 mg/dL — ABNORMAL HIGH (ref 0.50–0.99)
Globulin: 2.9 g/dL (calc) (ref 1.9–3.7)
Glucose, Bld: 158 mg/dL — ABNORMAL HIGH (ref 65–99)
Potassium: 4.3 mmol/L (ref 3.5–5.3)
Sodium: 135 mmol/L (ref 135–146)
Total Bilirubin: 0.3 mg/dL (ref 0.2–1.2)
Total Protein: 6.7 g/dL (ref 6.1–8.1)

## 2017-09-07 ENCOUNTER — Telehealth: Payer: Self-pay | Admitting: *Deleted

## 2017-09-07 NOTE — Telephone Encounter (Signed)
Received call from patient.   States that she is to be set up for 2D Echo, but has not been scheduled at this time.   Please call patient to discuss.

## 2017-09-10 NOTE — Telephone Encounter (Signed)
Spoke with patient and patient informed me that she would prefer to go to [facility]. Called [facility] and got patient scheduled for 09/13/17 at 0930. Patient was notified and verbalized understanding.

## 2017-09-10 NOTE — Telephone Encounter (Signed)
Called and left message with Rollene Fare at [facility] so that I may get patient's 2D echocardiogram scheduled. Awaiting return call

## 2017-09-13 ENCOUNTER — Ambulatory Visit (HOSPITAL_COMMUNITY)
Admission: RE | Admit: 2017-09-13 | Discharge: 2017-09-13 | Disposition: A | Discharge status: Home / Self Care | Payer: Medicare HMO | Source: Ambulatory Visit | Attending: Family Medicine | Admitting: Family Medicine

## 2017-09-13 DIAGNOSIS — I10 Essential (primary) hypertension: Secondary | ICD-10-CM

## 2017-09-13 DIAGNOSIS — R011 Cardiac murmur, unspecified: Secondary | ICD-10-CM

## 2017-09-13 DIAGNOSIS — K219 Gastro-esophageal reflux disease without esophagitis: Secondary | ICD-10-CM | POA: Insufficient documentation

## 2017-09-13 DIAGNOSIS — J418 Mixed simple and mucopurulent chronic bronchitis: Secondary | ICD-10-CM | POA: Diagnosis not present

## 2017-09-13 DIAGNOSIS — J449 Chronic obstructive pulmonary disease, unspecified: Secondary | ICD-10-CM | POA: Diagnosis not present

## 2017-09-13 DIAGNOSIS — Z720 Tobacco use: Secondary | ICD-10-CM | POA: Diagnosis not present

## 2017-09-13 NOTE — Progress Notes (Signed)
*  PRELIMINARY RESULTS* Echocardiogram 2D Echocardiogram has been performed.  Leavy Cella 09/13/2017, 10:24 AM

## 2017-11-05 ENCOUNTER — Other Ambulatory Visit: Payer: Self-pay | Admitting: Family Medicine

## 2017-12-07 ENCOUNTER — Other Ambulatory Visit: Payer: Self-pay | Admitting: Family Medicine

## 2017-12-07 DIAGNOSIS — Z1231 Encounter for screening mammogram for malignant neoplasm of breast: Secondary | ICD-10-CM

## 2018-01-06 ENCOUNTER — Other Ambulatory Visit: Payer: Self-pay | Admitting: Family Medicine

## 2018-01-21 ENCOUNTER — Ambulatory Visit (HOSPITAL_COMMUNITY): Payer: Medicare HMO

## 2018-01-21 ENCOUNTER — Other Ambulatory Visit: Payer: Self-pay | Admitting: Family Medicine

## 2018-01-30 ENCOUNTER — Ambulatory Visit (HOSPITAL_COMMUNITY): Payer: Medicare HMO

## 2018-02-01 ENCOUNTER — Ambulatory Visit (HOSPITAL_COMMUNITY): Payer: Medicare HMO

## 2018-02-01 ENCOUNTER — Ambulatory Visit (HOSPITAL_COMMUNITY)
Admission: RE | Admit: 2018-02-01 | Discharge: 2018-02-01 | Disposition: A | Discharge status: Home / Self Care | Payer: Medicare HMO | Source: Ambulatory Visit | Attending: Family Medicine | Admitting: Family Medicine

## 2018-02-01 DIAGNOSIS — Z1231 Encounter for screening mammogram for malignant neoplasm of breast: Secondary | ICD-10-CM | POA: Diagnosis not present

## 2018-02-25 ENCOUNTER — Other Ambulatory Visit: Payer: Self-pay

## 2018-02-25 ENCOUNTER — Encounter: Payer: Self-pay | Admitting: Family Medicine

## 2018-02-25 ENCOUNTER — Ambulatory Visit (INDEPENDENT_AMBULATORY_CARE_PROVIDER_SITE_OTHER): Payer: Medicare HMO | Admitting: Family Medicine

## 2018-02-25 VITALS — BP 136/82 | HR 62 | Temp 98.0°F | Resp 14 | Ht 65.0 in | Wt 202.0 lb

## 2018-02-25 DIAGNOSIS — L84 Corns and callosities: Secondary | ICD-10-CM

## 2018-02-25 DIAGNOSIS — Z0000 Encounter for general adult medical examination without abnormal findings: Secondary | ICD-10-CM | POA: Diagnosis not present

## 2018-02-25 DIAGNOSIS — I10 Essential (primary) hypertension: Secondary | ICD-10-CM

## 2018-02-25 DIAGNOSIS — J418 Mixed simple and mucopurulent chronic bronchitis: Secondary | ICD-10-CM | POA: Diagnosis not present

## 2018-02-25 DIAGNOSIS — R002 Palpitations: Secondary | ICD-10-CM

## 2018-02-25 DIAGNOSIS — E7439 Other disorders of intestinal carbohydrate absorption: Secondary | ICD-10-CM

## 2018-02-25 DIAGNOSIS — H5369 Other night blindness: Secondary | ICD-10-CM | POA: Diagnosis not present

## 2018-02-25 DIAGNOSIS — M21612 Bunion of left foot: Secondary | ICD-10-CM | POA: Diagnosis not present

## 2018-02-25 DIAGNOSIS — N3946 Mixed incontinence: Secondary | ICD-10-CM

## 2018-02-25 DIAGNOSIS — R7309 Other abnormal glucose: Secondary | ICD-10-CM | POA: Diagnosis not present

## 2018-02-25 DIAGNOSIS — Z23 Encounter for immunization: Secondary | ICD-10-CM | POA: Diagnosis not present

## 2018-02-25 DIAGNOSIS — M21619 Bunion of unspecified foot: Secondary | ICD-10-CM | POA: Diagnosis not present

## 2018-02-25 DIAGNOSIS — E6609 Other obesity due to excess calories: Secondary | ICD-10-CM

## 2018-02-25 DIAGNOSIS — Z6834 Body mass index (BMI) 34.0-34.9, adult: Secondary | ICD-10-CM

## 2018-02-25 MED ORDER — DESMOPRESSIN ACETATE 0.1 MG PO TABS: 0.1000 mg | ORAL_TABLET | Freq: Every day | ORAL | 0 refills | Status: DC

## 2018-02-25 MED ORDER — NYSTATIN 100000 UNIT/GM EX POWD: CUTANEOUS | 2 refills | Status: DC

## 2018-02-25 NOTE — Patient Instructions (Addendum)
Referral to eye doctor Referral to foot doctor We will call with results STOP SMoking! Pneumonia shot given  Cardiology referral for palpitations DDVAP 0.1mg  sent for your bladder  F/U 3 months

## 2018-02-25 NOTE — Progress Notes (Signed)
Subjective:   Patient presents for Medicare Annual/Subsequent preventive examination.    Chronic pain, she stopped going to pain clinic, last Jan, states it wasn't helping, She was on some type of buccal narcotic   Overactive bladder- gets up  15-20 times at night she states, for past year, she does get some constipation,she has some urgency during the day as well    Has a spot on her left foot, her sister has been trimming it down, she gets pain when she walks on it, has been present for many months    COPD- continues to smoke, using breo, has not needed nebulizer     Vision getting worse at nights, she gets double vision and glares at night, no difficulty during the day, no difficulty reading    She felt a skipping in her heart, feels it when she lays down.     Review Past Medical/Family/Social: Per EMR   Risk Factors  Current exercise habits: none  Dietary issues discussed: Yes   Cardiac risk factors: Obesity (BMI >= 30 kg/m2). HTN, Borderline DM, Smoker   Depression Screen  (Note: if answer to either of the following is "Yes", a more complete depression screening is indicated)  Over the past two weeks, have you felt down, depressed or hopeless? No Over the past two weeks, have you felt little interest or pleasure in doing things? No Have you lost interest or pleasure in daily life? No Do you often feel hopeless? No Do you cry easily over simple problems? No   Activities of Daily Living  In your present state of health, do you have any difficulty performing the following activities?:  Driving? No  Managing money? No  Feeding yourself? No  Getting from bed to chair? No  Climbing a flight of stairs? No  Preparing food and eating?: No  Bathing or showering? No  Getting dressed: No  Getting to the toilet? No  Using the toilet:No  Moving around from place to place: yes  In the past year have you fallen or had a near fall?:No  Are you sexually  active? No  Do you have more than one partner? No   Hearing Difficulties: Yes - UNCHANGED  Do you often ask people to speak up or repeat themselves? Yes Do you experience ringing or noises in your ears? No Do you have difficulty understanding soft or whispered voices? Yes Do you feel that you have a problem with memory? No Do you often misplace items? No  Do you feel safe at home? Yes  Cognitive Testing  Alert? Yes Normal Appearance?Yes  Oriented to person? Yes Place? Yes  Time? Yes  Recall of three objects? Yes  Can perform simple calculations? Yes  Displays appropriate judgment?Yes  Can read the correct time from a watch face?Yes    List the Names of Other Physician/Practitioners you currently use:   Dr. Tarri Glenn  Screening Tests / Date Colonoscopy        UTD             Zostavax declines Mammogram UTD Pneumonia vaccine- Due for pneumonia vax 23  Influenza Vaccine  Refuses Tetanus/tdap declines Bone DENSITY- done 2018   ROS: GEN- denies fatigue, fever, weight loss,weakness, recent illness HEENT- denies eye drainage, change in vision, nasal discharge, CVS- denies chest pain, palpitations RESP- denies SOB, cough, wheeze ABD- denies N/V, change in stools, abd pain GU- denies dysuria, hematuria, dribbling, incontinence MSK- + joint pain, muscle aches, injury Neuro- denies headache, dizziness, syncope,  seizure activity  PHYSICAL GEN- NAD, alert and oriented x3 HEENT- PERRL, EOMI, non injected sclera, pink conjunctiva, MMM, oropharynx clear, TM clear bilat, no effusion, nares clear ,vsion grossly in tact  Neck- Supple, no thryomegaly CVS- RRR, systolic murmur RESP-CTAB ABD-NABS,soft,NT,ND Psych- normal affect and mood  EXT- No edema, small callus TTP left metarsal arch, beneath 4th digit, bunion left great toe ,varicose veins  Pulses- Radial, DP- 2+    Assessment:    Annual wellness medicare exam   Plan:    During the course of the visit the patient was  educated and counseled about appropriate screening and preventive services including:    Pneumonia vax 23 given  Osteopenia- repeat Bone density in 2020   Vision changes- referral to eye doctor    COPD- doing well, declines flu, PNA shot given   Preulcerative callus left foot with bunion- referral to podiatry    palpitatons- EKG sinus bradycardia, with her risk factors the fact that she does have a murmur secondary to the mildly calcified valves on get a have her seen by cardiology I think she may benefit from event monitor.  Advised her she needs to quit smoking.  Her COPD could also be playing into this.   Mixed incontinence/OAB-  Previously followed by urology, was on toviaz, myrbetriq, vesicare  And on multiple medications.  She has taken her sisters desmopressin states that this worked.  We will put her on a very low-dose 0.1 mg at bedtime as she is exhausted all the other medications and this does work.  Chronic- I will not prescribe her pain meds , states she will live with it   Screen for depression. / Fall/ Audit C  NEGATIVE  Note pt tolerates nystatin powder    Diet review for nutrition referral? Yes ____ Not Indicated __x__  Patient Instructions (the written plan) was given to the patient.  Medicare Attestation  I have personally reviewed:  The patient's medical and social history  Their use of alcohol, tobacco or illicit drugs  Their current medications and supplements  The patient's functional ability including ADLs,fall risks, home safety risks, cognitive, and hearing and visual impairment  Diet and physical activities  Evidence for depression or mood disorders  The patient's weight, height, BMI, and visual acuity have been recorded in the chart. I have made referrals, counseling, and provided education to the patient based on review of the above and I have provided the patient with a written personalized care plan for preventive services.

## 2018-02-26 LAB — CBC WITH DIFFERENTIAL/PLATELET
Basophils Absolute: 79 cells/uL (ref 0–200)
Basophils Relative: 1.1 %
Eosinophils Absolute: 86 cells/uL (ref 15–500)
Eosinophils Relative: 1.2 %
HCT: 42.4 % (ref 35.0–45.0)
Hemoglobin: 14.2 g/dL (ref 11.7–15.5)
Lymphs Abs: 2304 cells/uL (ref 850–3900)
MCH: 31.3 pg (ref 27.0–33.0)
MCHC: 33.5 g/dL (ref 32.0–36.0)
MCV: 93.6 fL (ref 80.0–100.0)
MPV: 11.6 fL (ref 7.5–12.5)
Monocytes Relative: 9.3 %
Neutro Abs: 4061 cells/uL (ref 1500–7800)
Neutrophils Relative %: 56.4 %
Platelets: 271 10*3/uL (ref 140–400)
RBC: 4.53 10*6/uL (ref 3.80–5.10)
RDW: 13 % (ref 11.0–15.0)
Total Lymphocyte: 32 %
WBC mixed population: 670 cells/uL (ref 200–950)
WBC: 7.2 10*3/uL (ref 3.8–10.8)

## 2018-02-26 LAB — LIPID PANEL
Cholesterol: 126 mg/dL (ref <200)
HDL: 55 mg/dL (ref >50)
LDL Cholesterol (Calc): 57 mg/dL (calc)
Non-HDL Cholesterol (Calc): 71 mg/dL (calc) (ref <130)
Total CHOL/HDL Ratio: 2.3 (calc) (ref <5.0)
Triglycerides: 60 mg/dL (ref <150)

## 2018-02-26 LAB — COMPREHENSIVE METABOLIC PANEL
AG Ratio: 1.4 (calc) (ref 1.0–2.5)
ALT: 9 U/L (ref 6–29)
AST: 12 U/L (ref 10–35)
Albumin: 4 g/dL (ref 3.6–5.1)
Alkaline phosphatase (APISO): 71 U/L (ref 33–130)
BUN: 11 mg/dL (ref 7–25)
CO2: 28 mmol/L (ref 20–32)
Calcium: 9.4 mg/dL (ref 8.6–10.4)
Chloride: 97 mmol/L — ABNORMAL LOW (ref 98–110)
Creat: 0.94 mg/dL (ref 0.50–0.99)
Globulin: 2.9 g/dL (calc) (ref 1.9–3.7)
Glucose, Bld: 73 mg/dL (ref 65–99)
Potassium: 4.4 mmol/L (ref 3.5–5.3)
Sodium: 133 mmol/L — ABNORMAL LOW (ref 135–146)
Total Bilirubin: 0.2 mg/dL (ref 0.2–1.2)
Total Protein: 6.9 g/dL (ref 6.1–8.1)

## 2018-02-26 LAB — HEMOGLOBIN A1C
Hgb A1c MFr Bld: 5.5 % of total Hgb (ref <5.7)
Mean Plasma Glucose: 111 (calc)
eAG (mmol/L): 6.2 (calc)

## 2018-02-26 LAB — TSH: TSH: 2.65 mIU/L (ref 0.40–4.50)

## 2018-02-28 ENCOUNTER — Encounter: Payer: Self-pay | Admitting: *Deleted

## 2018-03-05 ENCOUNTER — Other Ambulatory Visit: Payer: Self-pay | Admitting: Family Medicine

## 2018-03-11 ENCOUNTER — Emergency Department (HOSPITAL_COMMUNITY)
Admission: EM | Admit: 2018-03-11 | Discharge: 2018-03-11 | Disposition: A | Discharge status: Home / Self Care | Payer: Medicare HMO | Attending: Emergency Medicine | Admitting: Emergency Medicine

## 2018-03-11 ENCOUNTER — Encounter (HOSPITAL_COMMUNITY): Payer: Self-pay | Admitting: Emergency Medicine

## 2018-03-11 ENCOUNTER — Other Ambulatory Visit: Payer: Self-pay

## 2018-03-11 DIAGNOSIS — J449 Chronic obstructive pulmonary disease, unspecified: Secondary | ICD-10-CM | POA: Insufficient documentation

## 2018-03-11 DIAGNOSIS — Z79899 Other long term (current) drug therapy: Secondary | ICD-10-CM | POA: Insufficient documentation

## 2018-03-11 DIAGNOSIS — N764 Abscess of vulva: Secondary | ICD-10-CM | POA: Diagnosis not present

## 2018-03-11 DIAGNOSIS — I10 Essential (primary) hypertension: Secondary | ICD-10-CM | POA: Insufficient documentation

## 2018-03-11 DIAGNOSIS — F17210 Nicotine dependence, cigarettes, uncomplicated: Secondary | ICD-10-CM | POA: Diagnosis not present

## 2018-03-11 DIAGNOSIS — M542 Cervicalgia: Secondary | ICD-10-CM | POA: Diagnosis not present

## 2018-03-11 DIAGNOSIS — M545 Low back pain: Secondary | ICD-10-CM | POA: Diagnosis not present

## 2018-03-11 MED ORDER — TRAMADOL HCL 50 MG PO TABS
100.0000 mg | ORAL_TABLET | Freq: Once | ORAL | Status: AC
  Administered 2018-03-11: 100 mg via ORAL
  Filled 2018-03-11: qty 2

## 2018-03-11 MED ORDER — TRAMADOL HCL 50 MG PO TABS: 50.0000 mg | ORAL_TABLET | Freq: Four times a day (QID) | ORAL | 0 refills | Status: DC | PRN

## 2018-03-11 MED ORDER — HYDROXYZINE HCL 25 MG PO TABS
25.0000 mg | ORAL_TABLET | Freq: Once | ORAL | Status: AC
  Administered 2018-03-11: 25 mg via ORAL
  Filled 2018-03-11: qty 1

## 2018-03-11 MED ORDER — DOXYCYCLINE HYCLATE 100 MG PO CAPS: 100.0000 mg | ORAL_CAPSULE | Freq: Two times a day (BID) | ORAL | 0 refills | Status: DC

## 2018-03-11 MED ORDER — DOXYCYCLINE HYCLATE 100 MG PO TABS
100.0000 mg | ORAL_TABLET | Freq: Once | ORAL | Status: AC
  Administered 2018-03-11: 100 mg via ORAL
  Filled 2018-03-11: qty 1

## 2018-03-11 NOTE — ED Provider Notes (Signed)
[facility] Provider Note   CSN: [id number] Arrival date & time: 03/11/18  1123     History   Chief Complaint Chief Complaint  Patient presents with  . Abscess    HPI Paula Long is a 67 y.o. female.  The history is provided by the patient.  Abscess  Location:  Pelvis Pelvic abscess location:  Vagina Abscess quality: painful and warmth   Abscess quality: not draining   Duration:  2 days Progression:  Worsening Pain details:    Quality:  Throbbing   Severity:  Moderate   Duration:  2 days   Timing:  Intermittent   Progression:  Worsening Chronicity:  New Context: not diabetes, not immunosuppression and not injected drug use   Relieved by:  Nothing Exacerbated by: palpation and movement. Ineffective treatments:  None tried Associated symptoms: no fever, no nausea and no vomiting   Risk factors: no prior abscess     Past Medical History:  Diagnosis Date  . Anxiety   . Arthritis 12-07-11   arthritis,DDD,spinal stenosis  . Chronic back pain   . COPD (chronic obstructive pulmonary disease) ([place]) 12-07-11   pt. uses nebulizer as needed and inhaler daily  . Fibromyalgia    skin cancer  . GERD (gastroesophageal reflux disease)   . Hemorrhoids   . Hypertension 12-07-11   presently no meds  . Normal cardiac stress test 06/2009  . Shortness of breath 12-07-11   less now, occ wheezes  . Skin cancer    melanoma.  face 2014    Patient Active Problem List   Diagnosis Date Noted  . Depression with anxiety 05/22/2014  . Family hx of colon cancer 02/19/2014  . IBS (irritable bowel syndrome) 01/23/2014  . Urinary incontinence, mixed 10/13/2013  . Chronic insomnia 09/12/2013  . Boil 02/27/2013  . Skin lesion of face 10/22/2012  . Hemorrhoids 10/22/2012  . Intertrigo 06/21/2012  . Neck pain 06/21/2012  . Bunion of great toe 06/21/2012  . OAB (overactive bladder) 03/15/2012  . Spinal stenosis, lumbar region, with neurogenic claudication  12/11/2011  . Essential hypertension, benign 10/30/2011  . Chronic back pain 10/30/2011  . GERD (gastroesophageal reflux disease) 10/30/2011  . COPD (chronic obstructive pulmonary disease) ([place]) 10/30/2011  . Tobacco user 10/30/2011  . Obesity 10/30/2011  . Abnormal mammogram 10/30/2011    Past Surgical History:  Procedure Laterality Date  . ABDOMINAL HYSTERECTOMY    . BACK SURGERY  12-07-11   2'10 L5 x2  . COLONOSCOPY WITH PROPOFOL N/A 06/03/2014   Procedure: COLONOSCOPY WITH PROPOFOL; IN CECUM AT 0750; TOTAL WITHDRAWAL TIME 16 MINUTES;  Surgeon: Rogene Houston, MD;  Location: [facility];  Service: Endoscopy;  Laterality: N/A;  . left ankle surgery     tendonitis  . LUMBAR LAMINECTOMY/DECOMPRESSION MICRODISCECTOMY  12/11/2011   Procedure: LUMBAR LAMINECTOMY/DECOMPRESSION MICRODISCECTOMY;  Surgeon: Tobi Bastos, MD;  Location: [facility];  Service: Orthopedics;  Laterality: Left;  Decompressive Lumbar Laminectomy L5-S1 on Left  . POLYPECTOMY N/A 06/03/2014   Procedure: POLYPECTOMY;  Surgeon: Rogene Houston, MD;  Location: [facility];  Service: Endoscopy;  Laterality: N/A;  . right wrist surgery for pinched nerve    . trigger finger surgery  12-07-11   rt. middle trigger finger release     OB History    Gravida  3   Para  2   Term  2   Preterm      AB  1   Living  SAB  1   TAB      Ectopic      Multiple      Live Births               Home Medications    Prior to Admission medications   Medication Sig Start Date End Date Taking? Authorizing Provider  albuterol (PROVENTIL) (2.5 MG/3ML) 0.083% nebulizer solution INHALE THE CONTENTS OF 1 VIAL VIA NEBULIZER EVERY 4 HOURS AS NEEDED FOR WHEEZING OR SHORTNESS OF BREATH 09/18/16  Yes [place], Modena Nunnery, MD  desmopressin (DDAVP) 0.1 MG tablet Take 1 tablet (0.1 mg total) by mouth at bedtime. For bladder 02/25/18  Yes [place], Modena Nunnery, MD  DEXILANT 60 MG capsule TAKE 1 CAPSULE EVERY DAY 08/03/17  Yes [place], Modena Nunnery, MD    fluticasone furoate-vilanterol (BREO ELLIPTA) 100-25 MCG/INH AEPB Inhale 1 puff into the lungs daily. 02/23/17  Yes [place], Modena Nunnery, MD  gabapentin (NEURONTIN) 400 MG capsule TAKE 2 CAPSULES AT BEDTIME 03/06/18  Yes [place], Modena Nunnery, MD  ibuprofen (ADVIL,MOTRIN) 600 MG tablet TAKE 1 TABLET TWICE DAILY AS NEEDED 01/07/18  Yes [place], Modena Nunnery, MD  nystatin (MYCOSTATIN/NYSTOP) powder APPLY  TOPICALLY TWICE DAILY AS NEEDED 02/25/18  Yes [place], Modena Nunnery, MD  traZODone (DESYREL) 150 MG tablet TAKE 1 TABLET (150 MG TOTAL) BY MOUTH AT BEDTIME. 01/07/18  Yes [place], Modena Nunnery, MD  VENTOLIN HFA 108 (90 Base) MCG/ACT inhaler INHALE 2 PUFFS EVERY 4 HOURS AS NEEDED FOR WHEEZING OR SHORTNESS OF BREATH 01/21/18  Yes [place], Modena Nunnery, MD  [facility] TEST test strip USE TO TEST ONCE DAILY. Patient not taking: Reported on 03/11/2018 06/03/15   Susy Frizzle, MD    Family History Family History  Problem Relation Age of Onset  . Cancer Mother        uterine  . Cancer Father        lung    Social History Social History   Tobacco Use  . Smoking status: Current Every Day Smoker    Packs/day: 2.00    Years: 48.00    Pack years: 96.00    Types: Cigarettes  . Smokeless tobacco: Never Used  . Tobacco comment: since age 47. smoking 2-3 packs a day  Substance Use Topics  . Alcohol use: No    Alcohol/week: 0.0 standard drinks  . Drug use: No     Allergies   Sulfa antibiotics; Nystatin; Percocet [oxycodone-acetaminophen]; Vesicare [solifenacin]; and Vicodin [hydrocodone-acetaminophen]   Review of Systems Review of Systems  Constitutional: Negative for activity change and fever.       All ROS Neg except as noted in HPI  HENT: Negative for nosebleeds.   Eyes: Negative for photophobia and discharge.  Respiratory: Negative for cough, shortness of breath and wheezing.   Cardiovascular: Negative for chest pain and palpitations.  Gastrointestinal: Negative for abdominal pain, blood in stool, nausea  and vomiting.  Genitourinary: Negative for dysuria, frequency and hematuria.  Musculoskeletal: Positive for arthralgias, back pain and neck pain.  Skin: Negative.   Neurological: Negative for dizziness, seizures and speech difficulty.  Psychiatric/Behavioral: Negative for confusion and hallucinations.     Physical Exam Updated Vital Signs BP (!) 185/70 (BP Location: Right Arm)   Pulse 80   Temp 97.7 F (36.5 C) (Oral)   Resp 18   Ht 5\' 5"  (1.651 m)   Wt 81.6 kg   SpO2 97%   BMI 29.95 kg/m   Physical Exam Vitals signs and nursing note reviewed. Exam  conducted with a chaperone present.  Constitutional:      Appearance: She is well-developed. She is not toxic-appearing.  HENT:     Head: Normocephalic.     Right Ear: Tympanic membrane and external ear normal.     Left Ear: Tympanic membrane and external ear normal.  Eyes:     General: Lids are normal.     Pupils: Pupils are equal, round, and reactive to light.  Neck:     Musculoskeletal: Normal range of motion and neck supple.     Vascular: No carotid bruit.  Cardiovascular:     Rate and Rhythm: Normal rate and regular rhythm.     Pulses: Normal pulses.     Heart sounds: Murmur present. Systolic murmur present with a grade of 2/6.  Pulmonary:     Effort: No respiratory distress.     Breath sounds: Normal breath sounds.  Abdominal:     General: Bowel sounds are normal.     Palpations: Abdomen is soft.     Tenderness: There is no abdominal tenderness. There is no guarding.  Genitourinary:    Labia:        Right: Tenderness present.     Musculoskeletal: Normal range of motion.  Lymphadenopathy:     Head:     Right side of head: No submandibular adenopathy.     Left side of head: No submandibular adenopathy.     Cervical: No cervical adenopathy.  Skin:    General: Skin is warm and dry.  Neurological:     Mental Status: She is alert and oriented to person, place, and time.     Cranial Nerves: No cranial nerve  deficit.     Sensory: No sensory deficit.  Psychiatric:        Speech: Speech normal.      ED Treatments / Results  Labs (all labs ordered are listed, but only abnormal results are displayed) Labs Reviewed - No data to display  EKG None  Radiology No results found.  Procedures Procedures (including critical care time)  Medications Ordered in ED Medications - No data to display   Initial Impression / Assessment and Plan / ED Course  I have reviewed the triage vital signs and the nursing notes.  Pertinent labs & imaging results that were available during my care of the patient were reviewed by me and considered in my medical decision making (see chart for details).       Final Clinical Impressions(s) / ED Diagnoses MDM  Blood pressure is elevated at 185/70.  Have asked the patient to have this rechecked soon.  The patient has a red raised area of the right lower labia.  Area is tender to touch, but no drainage and no red streaks appreciated.  This abscess is not a candidate for incision and drainage at this time.  The patient is encouraged to use warm tub soaks.  Prescription for doxycycline given to the patient.  Patient will use Tylenol every 4 hours for mild pain, use Ultram every 6 hours for more severe pain.  The patient is to follow-up with [facility], or return to the emergency department if not improving.   Final diagnoses:  Abscess of right genital labia    ED Discharge Orders         Ordered    doxycycline (VIBRAMYCIN) 100 MG capsule  2 times daily     03/11/18 1245    traMADol (ULTRAM) 50 MG tablet  Every 6 hours PRN  03/11/18 [place], [place], PA-C 03/11/18 [place], [place], DO 03/13/18 [phone]

## 2018-03-11 NOTE — ED Triage Notes (Signed)
Patient reports abscess to her right labia that started on Friday.

## 2018-03-11 NOTE — Discharge Instructions (Signed)
Your examination suggest an abscess/cyst of the right lower labia.  Please use warm Epson salt tub soaks for 10 to 15 minutes daily.  Use doxycycline 2 times daily with food.  Please use Tylenol extra strength for mild pain.  Use Ultram every 6 hours for more severe pain.  Please take this medication with food.  Please see Dr Elonda Husky at [facility], or return to the emergency department if the abscess area is not improving.

## 2018-03-22 ENCOUNTER — Other Ambulatory Visit: Payer: Self-pay | Admitting: Family Medicine

## 2018-03-25 DIAGNOSIS — H524 Presbyopia: Secondary | ICD-10-CM | POA: Diagnosis not present

## 2018-03-25 DIAGNOSIS — H5213 Myopia, bilateral: Secondary | ICD-10-CM | POA: Diagnosis not present

## 2018-03-25 DIAGNOSIS — H52223 Regular astigmatism, bilateral: Secondary | ICD-10-CM | POA: Diagnosis not present

## 2018-03-25 DIAGNOSIS — H2513 Age-related nuclear cataract, bilateral: Secondary | ICD-10-CM | POA: Diagnosis not present

## 2018-04-02 ENCOUNTER — Encounter: Payer: Self-pay | Admitting: Cardiology

## 2018-04-02 ENCOUNTER — Ambulatory Visit: Payer: Medicare HMO | Admitting: Cardiology

## 2018-04-02 VITALS — BP 173/74 | HR 74 | Ht 65.0 in | Wt 202.2 lb

## 2018-04-02 DIAGNOSIS — R0789 Other chest pain: Secondary | ICD-10-CM

## 2018-04-02 DIAGNOSIS — R002 Palpitations: Secondary | ICD-10-CM

## 2018-04-02 NOTE — Progress Notes (Signed)
Clinical Summary Paula Long is a 68 y.o.female seen as new consult, referred by Dr Buelah Manis for palpitations.  1. Palpitations - started a few months ago - fluttering in chest, occurs few times week. Lasts a few seconds. Some SOB at times   2. Chest pain - sharp pain, right and left chest, can radiate down right arm.  - lasts about 5 minutes, few times a week.      Past Medical History:  Diagnosis Date  . Anxiety   . Arthritis 12-07-11   arthritis,DDD,spinal stenosis  . Chronic back pain   . COPD (chronic obstructive pulmonary disease) ([place]) 12-07-11   pt. uses nebulizer as needed and inhaler daily  . Fibromyalgia    skin cancer  . GERD (gastroesophageal reflux disease)   . Hemorrhoids   . Hypertension 12-07-11   presently no meds  . Normal cardiac stress test 06/2009  . Shortness of breath 12-07-11   less now, occ wheezes  . Skin cancer    melanoma.  face 2014     Allergies  Allergen Reactions  . Sulfa Antibiotics   . Nystatin Rash    Oral rash  . Percocet [Oxycodone-Acetaminophen] Rash    Oral rash  . Vesicare [Solifenacin] Rash  . Vicodin [Hydrocodone-Acetaminophen] Rash    Oral rash     Current Outpatient Medications  Medication Sig Dispense Refill  . albuterol (PROVENTIL) (2.5 MG/3ML) 0.083% nebulizer solution INHALE THE CONTENTS OF 1 VIAL VIA NEBULIZER EVERY 4 HOURS AS NEEDED FOR WHEEZING OR SHORTNESS OF BREATH 90 mL 1  . desmopressin (DDAVP) 0.1 MG tablet Take 1 tablet (0.1 mg total) by mouth at bedtime. For bladder 90 tablet 0  . DEXILANT 60 MG capsule TAKE 1 CAPSULE EVERY DAY 90 capsule 3  . doxycycline (VIBRAMYCIN) 100 MG capsule Take 1 capsule (100 mg total) by mouth 2 (two) times daily. 14 capsule 0  . EASYMAX TEST test strip USE TO TEST ONCE DAILY. (Patient not taking: Reported on 03/11/2018) 100 each 3  . fluticasone furoate-vilanterol (BREO ELLIPTA) 100-25 MCG/INH AEPB Inhale 1 puff into the lungs daily. 30 each 0  . gabapentin (NEURONTIN) 400  MG capsule TAKE 2 CAPSULES AT BEDTIME 180 capsule 3  . ibuprofen (ADVIL,MOTRIN) 600 MG tablet TAKE 1 TABLET TWICE DAILY AS NEEDED 180 tablet 1  . nystatin (MYCOSTATIN/NYSTOP) powder APPLY  TOPICALLY TWICE DAILY AS NEEDED 60 g 2  . traMADol (ULTRAM) 50 MG tablet Take 1 tablet (50 mg total) by mouth every 6 (six) hours as needed. 15 tablet 0  . traZODone (DESYREL) 150 MG tablet TAKE 1 TABLET (150 MG TOTAL) BY MOUTH AT BEDTIME. 90 tablet 3  . VENTOLIN HFA 108 (90 Base) MCG/ACT inhaler INHALE 2 PUFFS EVERY 4 HOURS AS NEEDED FOR WHEEZING OR SHORTNESS OF BREATH 18 g 3   No current facility-administered medications for this visit.      Past Surgical History:  Procedure Laterality Date  . ABDOMINAL HYSTERECTOMY    . BACK SURGERY  12-07-11   2'10 L5 x2  . COLONOSCOPY WITH PROPOFOL N/A 06/03/2014   Procedure: COLONOSCOPY WITH PROPOFOL; IN CECUM AT 0750; TOTAL WITHDRAWAL TIME 16 MINUTES;  Surgeon: Rogene Houston, MD;  Location: [facility];  Service: Endoscopy;  Laterality: N/A;  . left ankle surgery     tendonitis  . LUMBAR LAMINECTOMY/DECOMPRESSION MICRODISCECTOMY  12/11/2011   Procedure: LUMBAR LAMINECTOMY/DECOMPRESSION MICRODISCECTOMY;  Surgeon: Tobi Bastos, MD;  Location: [facility];  Service: Orthopedics;  Laterality: Left;  Decompressive Lumbar Laminectomy L5-S1 on Left  . POLYPECTOMY N/A 06/03/2014   Procedure: POLYPECTOMY;  Surgeon: Rogene Houston, MD;  Location: [facility];  Service: Endoscopy;  Laterality: N/A;  . right wrist surgery for pinched nerve    . trigger finger surgery  12-07-11   rt. middle trigger finger release     Allergies  Allergen Reactions  . Sulfa Antibiotics   . Nystatin Rash    Oral rash  . Percocet [Oxycodone-Acetaminophen] Rash    Oral rash  . Vesicare [Solifenacin] Rash  . Vicodin [Hydrocodone-Acetaminophen] Rash    Oral rash      Family History  Problem Relation Age of Onset  . Cancer Mother        uterine  . Cancer Father        lung     Social  History Paula Long reports that she has been smoking cigarettes. She has a 96.00 pack-year smoking history. She has never used smokeless tobacco. Paula Long reports no history of alcohol use.   Review of Systems CONSTITUTIONAL: No weight loss, fever, chills, weakness or fatigue.  HEENT: Eyes: No visual loss, blurred vision, double vision or yellow sclerae.No hearing loss, sneezing, congestion, runny nose or sore throat.  SKIN: No rash or itching.  CARDIOVASCULAR: per hpi RESPIRATORY: per hpi GASTROINTESTINAL: No anorexia, nausea, vomiting or diarrhea. No abdominal pain or blood.  GENITOURINARY: No burning on urination, no polyuria NEUROLOGICAL: No headache, dizziness, syncope, paralysis, ataxia, numbness or tingling in the extremities. No change in bowel or bladder control.  MUSCULOSKELETAL: No muscle, back pain, joint pain or stiffness.  LYMPHATICS: No enlarged nodes. No history of splenectomy.  PSYCHIATRIC: No history of depression or anxiety.  ENDOCRINOLOGIC: No reports of sweating, cold or heat intolerance. No polyuria or polydipsia.  Marland Kitchen   Physical Examination Vitals:   04/02/18 1334 04/02/18 1341  BP: (!) 156/73 (!) 173/74  Pulse: 74 74  SpO2: 98% 98%   Vitals:   04/02/18 1334  Weight: 202 lb 3.2 oz (91.7 kg)  Height: 5\' 5"  (1.651 m)    Gen: resting comfortably, no acute distress HEENT: no scleral icterus, pupils equal round and reactive, no palptable cervical adenopathy,  CV: RRR, no m/r/g, no jvd Resp: Clear to auscultation bilaterally GI: abdomen is soft, non-tender, non-distended, normal bowel sounds, no hepatosplenomegaly MSK: extremities are warm, no edema.  Skin: warm, no rash Neuro:  no focal deficits Psych: appropriate affect   Diagnostic Studies  08/2017 echo Study Conclusions  - Left ventricle: The cavity size was normal. Wall thickness was   normal. Systolic function was normal. The estimated ejection   fraction was in the range of 55% to 60%. Wall  motion was normal;   there were no regional wall motion abnormalities. Left   ventricular diastolic function parameters were normal. - Aortic valve: Mildly calcified annulus. Mildly thickened   leaflets. Valve area (VTI): 2.58 cm^2. Valve area (Vmax): 2.54   cm^2. - Mitral valve: Mildly calcified annulus. Normal thickness leaflets   . - Left atrium: The atrium was mildly dilated. - Technically adequate study.   Assessment and Plan  1. Palpitations - EKG today shows NSR - we will plan for an event monitor to further evaluate   2. Chest pain - fairly atypical, perhaps could be associatd with a symptomatic arrhythmia - begin workup with an event monitor.       Arnoldo Lenis, M.D.

## 2018-04-02 NOTE — Patient Instructions (Signed)
Your physician recommends that you schedule a follow-up appointment in: [place] recommends that you continue on your current medications as directed. Please refer to the Current Medication list given to you today.  Your physician has recommended that you wear an event monitor FOR 2 WEEKS. Event monitors are medical devices that record the heart's electrical activity. Doctors most often [place] these monitors to diagnose arrhythmias. Arrhythmias are problems with the speed or rhythm of the heartbeat. The monitor is a small, portable device. You can wear one while you do your normal daily activities. This is usually used to diagnose what is causing palpitations/syncope (passing out).  Thank you for choosing [facility]!!

## 2018-04-04 ENCOUNTER — Encounter: Payer: Self-pay | Admitting: Cardiology

## 2018-04-10 ENCOUNTER — Ambulatory Visit: Payer: Medicare HMO

## 2018-04-10 DIAGNOSIS — R002 Palpitations: Secondary | ICD-10-CM | POA: Diagnosis not present

## 2018-04-15 DIAGNOSIS — L821 Other seborrheic keratosis: Secondary | ICD-10-CM | POA: Diagnosis not present

## 2018-04-15 DIAGNOSIS — L570 Actinic keratosis: Secondary | ICD-10-CM | POA: Diagnosis not present

## 2018-04-15 DIAGNOSIS — Z85828 Personal history of other malignant neoplasm of skin: Secondary | ICD-10-CM | POA: Diagnosis not present

## 2018-05-02 ENCOUNTER — Telehealth: Payer: Self-pay | Admitting: *Deleted

## 2018-05-02 NOTE — Telephone Encounter (Signed)
Pt aware - routed to pcp  

## 2018-05-02 NOTE — Telephone Encounter (Signed)
-----   Message from Arnoldo Lenis, MD sent at 05/02/2018  2:53 PM EST ----- Normal heart monitor. We will reevaluate her symptoms at our upcoming appt and decide if any further testing is needed  Zandra Abts MD

## 2018-05-06 ENCOUNTER — Other Ambulatory Visit: Payer: Self-pay | Admitting: Family Medicine

## 2018-05-09 ENCOUNTER — Other Ambulatory Visit: Payer: Self-pay | Admitting: Family Medicine

## 2018-05-13 ENCOUNTER — Other Ambulatory Visit: Payer: Self-pay | Admitting: Family Medicine

## 2018-05-14 ENCOUNTER — Ambulatory Visit: Payer: Medicare HMO | Admitting: Cardiology

## 2018-05-14 ENCOUNTER — Encounter: Payer: Self-pay | Admitting: Cardiology

## 2018-05-14 VITALS — BP 148/76 | HR 76 | Ht 65.0 in | Wt 206.4 lb

## 2018-05-14 DIAGNOSIS — R002 Palpitations: Secondary | ICD-10-CM | POA: Diagnosis not present

## 2018-05-14 DIAGNOSIS — I10 Essential (primary) hypertension: Secondary | ICD-10-CM | POA: Diagnosis not present

## 2018-05-14 MED ORDER — DILTIAZEM HCL 30 MG PO TABS: 30.0000 mg | ORAL_TABLET | Freq: Two times a day (BID) | ORAL | 1 refills | Status: DC

## 2018-05-14 NOTE — Progress Notes (Signed)
Clinical Summary [name] is a 68 y.o.female seen today for follow up of the following medical problems.   1. Palpitations - started a few months ago - fluttering in chest, occurs few times week. Lasts a few seconds. Some SOB at times  - Jan 2020 event monitor no symptoms reported, no arrhythmias - still with symptoms at times, seem to be less frequent.    2. Chest pain - no recent symptoms.   3. HTN - home bp's 150s/90-100 - had been on norvasc a few years ago. For a period was able to come off bp meds but last few visits and by home numbers have been elevated.   Past Medical History:  Diagnosis Date  . Anxiety   . Arthritis 12-07-11   arthritis,DDD,spinal stenosis  . Chronic back pain   . COPD (chronic obstructive pulmonary disease) ([place]) 12-07-11   pt. uses nebulizer as needed and inhaler daily  . Fibromyalgia    skin cancer  . GERD (gastroesophageal reflux disease)   . Hemorrhoids   . Hypertension 12-07-11   presently no meds  . Normal cardiac stress test 06/2009  . Shortness of breath 12-07-11   less now, occ wheezes  . Skin cancer    melanoma.  face 2014     Allergies  Allergen Reactions  . Sulfa Antibiotics   . Nystatin Rash    Oral rash  . Percocet [Oxycodone-Acetaminophen] Rash    Oral rash  . Vesicare [Solifenacin] Rash  . Vicodin [Hydrocodone-Acetaminophen] Rash    Oral rash     Current Outpatient Medications  Medication Sig Dispense Refill  . albuterol (PROVENTIL) (2.5 MG/3ML) 0.083% nebulizer solution INHALE THE CONTENTS OF 1 VIAL VIA NEBULIZER EVERY 4 HOURS AS NEEDED FOR WHEEZING OR SHORTNESS OF BREATH 90 mL 1  . desmopressin (DDAVP) 0.1 MG tablet TAKE 1 TABLET AT BEDTIME FOR BLADDER 90 tablet 0  . DEXILANT 60 MG capsule TAKE 1 CAPSULE EVERY DAY 90 capsule 3  . EASYMAX TEST test strip USE TO TEST ONCE DAILY. 100 each 3  . gabapentin (NEURONTIN) 400 MG capsule TAKE 2 CAPSULES AT BEDTIME 180 capsule 3  . IBU 600 MG tablet TAKE 1 TABLET  TWICE DAILY AS NEEDED 180 tablet 1  . nystatin (MYCOSTATIN/NYSTOP) powder APPLY  TOPICALLY TWICE DAILY AS NEEDED 60 g 2  . traMADol (ULTRAM) 50 MG tablet Take 1 tablet (50 mg total) by mouth every 6 (six) hours as needed. 15 tablet 0  . traZODone (DESYREL) 150 MG tablet TAKE 1 TABLET (150 MG TOTAL) BY MOUTH AT BEDTIME. 90 tablet 3  . VENTOLIN HFA 108 (90 Base) MCG/ACT inhaler INHALE 2 PUFFS EVERY 4 HOURS AS NEEDED FOR WHEEZING OR SHORTNESS OF BREATH 18 g 3   No current facility-administered medications for this visit.      Past Surgical History:  Procedure Laterality Date  . ABDOMINAL HYSTERECTOMY    . BACK SURGERY  12-07-11   2'10 L5 x2  . COLONOSCOPY WITH PROPOFOL N/A 06/03/2014   Procedure: COLONOSCOPY WITH PROPOFOL; IN CECUM AT 0750; TOTAL WITHDRAWAL TIME 16 MINUTES;  Surgeon: Rogene Houston, MD;  Location: [facility];  Service: Endoscopy;  Laterality: N/A;  . left ankle surgery     tendonitis  . LUMBAR LAMINECTOMY/DECOMPRESSION MICRODISCECTOMY  12/11/2011   Procedure: LUMBAR LAMINECTOMY/DECOMPRESSION MICRODISCECTOMY;  Surgeon: Tobi Bastos, MD;  Location: [facility];  Service: Orthopedics;  Laterality: Left;  Decompressive Lumbar Laminectomy L5-S1 on Left  . POLYPECTOMY N/A 06/03/2014  Procedure: POLYPECTOMY;  Surgeon: Rogene Houston, MD;  Location: [facility];  Service: Endoscopy;  Laterality: N/A;  . right wrist surgery for pinched nerve    . trigger finger surgery  12-07-11   rt. middle trigger finger release     Allergies  Allergen Reactions  . Sulfa Antibiotics   . Nystatin Rash    Oral rash  . Percocet [Oxycodone-Acetaminophen] Rash    Oral rash  . Vesicare [Solifenacin] Rash  . Vicodin [Hydrocodone-Acetaminophen] Rash    Oral rash      Family History  Problem Relation Age of Onset  . Cancer Mother        uterine  . Cancer Father        lung     Social History [name] reports that she has been smoking cigarettes. She started smoking about 48 years ago. She has  a 96.00 pack-year smoking history. She has never used smokeless tobacco. [name] reports no history of alcohol use.   Review of Systems CONSTITUTIONAL: No weight loss, fever, chills, weakness or fatigue.  HEENT: Eyes: No visual loss, blurred vision, double vision or yellow sclerae.No hearing loss, sneezing, congestion, runny nose or sore throat.  SKIN: No rash or itching.  CARDIOVASCULAR: per hpi RESPIRATORY: No shortness of breath, cough or sputum.  GASTROINTESTINAL: No anorexia, nausea, vomiting or diarrhea. No abdominal pain or blood.  GENITOURINARY: No burning on urination, no polyuria NEUROLOGICAL: No headache, dizziness, syncope, paralysis, ataxia, numbness or tingling in the extremities. No change in bowel or bladder control.  MUSCULOSKELETAL: No muscle, back pain, joint pain or stiffness.  LYMPHATICS: No enlarged nodes. No history of splenectomy.  PSYCHIATRIC: No history of depression or anxiety.  ENDOCRINOLOGIC: No reports of sweating, cold or heat intolerance. No polyuria or polydipsia.  Marland Kitchen   Physical Examination Vitals:   05/14/18 1436  BP: (!) 148/76  Pulse: 76  SpO2: 96%   Vitals:   05/14/18 1436  Weight: 206 lb 6.4 oz (93.6 kg)  Height: 5\' 5"  (1.651 m)    Gen: resting comfortably, no acute distress HEENT: no scleral icterus, pupils equal round and reactive, no palptable cervical adenopathy,  CV: RRR, no m/r/g, no jvd Resp: Clear to auscultation bilaterally GI: abdomen is soft, non-tender, non-distended, normal bowel sounds, no hepatosplenomegaly MSK: extremities are warm, no edema.  Skin: warm, no rash Neuro:  no focal deficits Psych: appropriate affect   Diagnostic Studies 08/2017 echo Study Conclusions  - Left ventricle: The cavity size was normal. Wall thickness was normal. Systolic function was normal. The estimated ejection fraction was in the range of 55% to 60%. Wall motion was normal; there were no regional wall motion abnormalities.  Left ventricular diastolic function parameters were normal. - Aortic valve: Mildly calcified annulus. Mildly thickened leaflets. Valve area (VTI): 2.58 cm^2. Valve area (Vmax): 2.54 cm^2. - Mitral valve: Mildly calcified annulus. Normal thickness leaflets . - Left atrium: The atrium was mildly dilated. - Technically adequate study.  Jan 2020 event monitor  14 day event monitor  Min HR 46, Max HR 123, Avg HR 73. Min HR occurred in very early AM hours presumably while sleeping  No symptoms reported  Telemetry tracings show sinus rhythm  No significant arrhythmias  Assessment and Plan  1. Palpitations -benign monitor, still symptoms at times. Try starting dilt 30mg  bid.    2. HTN - in setting of palpitations and HTN start dilt 30mg  bid    F/u 6 months    Arnoldo Lenis,  M.D. 

## 2018-05-14 NOTE — Patient Instructions (Signed)
Your physician wants you to follow-up in: [place] will receive a reminder letter in the mail two months in advance. If you don't receive a letter, please call our office to schedule the follow-up appointment.  Your physician has recommended you make the following change in your medication:   START DILTIAZEM 30 MG TWICE DAILY  Thank you for choosing [facility]!!

## 2018-05-27 ENCOUNTER — Other Ambulatory Visit: Payer: Self-pay | Admitting: Family Medicine

## 2018-05-27 ENCOUNTER — Ambulatory Visit: Payer: Medicare HMO | Admitting: Family Medicine

## 2018-06-04 ENCOUNTER — Ambulatory Visit: Payer: Medicare HMO | Admitting: Family Medicine

## 2018-06-05 ENCOUNTER — Ambulatory Visit: Payer: Medicare HMO | Admitting: Family Medicine

## 2018-07-04 ENCOUNTER — Encounter: Payer: Self-pay | Admitting: Family Medicine

## 2018-07-04 ENCOUNTER — Ambulatory Visit (INDEPENDENT_AMBULATORY_CARE_PROVIDER_SITE_OTHER): Payer: Medicare HMO | Admitting: Family Medicine

## 2018-07-04 VITALS — BP 148/60 | HR 97 | Temp 97.8°F | Resp 20 | Ht 65.0 in | Wt 201.0 lb

## 2018-07-04 DIAGNOSIS — M898X1 Other specified disorders of bone, shoulder: Secondary | ICD-10-CM | POA: Diagnosis not present

## 2018-07-04 DIAGNOSIS — R0781 Pleurodynia: Secondary | ICD-10-CM

## 2018-07-04 MED ORDER — HYDROMORPHONE HCL 4 MG PO TABS: 4.0000 mg | ORAL_TABLET | ORAL | 0 refills | Status: DC | PRN

## 2018-07-04 NOTE — Progress Notes (Signed)
Subjective:    Patient ID: Paula Long, female    DOB: 11/10/1950, 68 y.o.   MRN: [id number]  HPI 1 week ago, the patient was sleeping on the couch next to her sister who was in hospital bed dying.  She had fallen asleep on the couch.  She is not sure what happened however she woke up when she fell out of the couch and hit directly on a cement floor.  She landed directly on her chest is well is on her right side.  Since that time she has had sharp severe pain in her right scapula.  She also has tenderness to palpation and pain in her right ribs just below the scapula on the posterior right side.  She denies any shortness of breath.  She denies any hemoptysis.  She denies any cough or fever.  However she does have pain with coughing and pain with deep inspiration under the scapula as well as in that rib area.  She is tender to palpation over the scapula and the posterior ribs on the right side.  However her lungs are clear to auscultation bilaterally and there is no respiratory distress and no increased work of breathing Past Medical History:  Diagnosis Date  . Anxiety   . Arthritis 12-07-11   arthritis,DDD,spinal stenosis  . Chronic back pain   . COPD (chronic obstructive pulmonary disease) ([place]) 12-07-11   pt. uses nebulizer as needed and inhaler daily  . Fibromyalgia    skin cancer  . GERD (gastroesophageal reflux disease)   . Hemorrhoids   . Hypertension 12-07-11   presently no meds  . Normal cardiac stress test 06/2009  . Shortness of breath 12-07-11   less now, occ wheezes  . Skin cancer    melanoma.  face 2014   Past Surgical History:  Procedure Laterality Date  . ABDOMINAL HYSTERECTOMY    . BACK SURGERY  12-07-11   2'10 L5 x2  . COLONOSCOPY WITH PROPOFOL N/A 06/03/2014   Procedure: COLONOSCOPY WITH PROPOFOL; IN CECUM AT 0750; TOTAL WITHDRAWAL TIME 16 MINUTES;  Surgeon: Rogene Houston, MD;  Location: [facility];  Service: Endoscopy;  Laterality: N/A;  . left ankle surgery     tendonitis  . LUMBAR LAMINECTOMY/DECOMPRESSION MICRODISCECTOMY  12/11/2011   Procedure: LUMBAR LAMINECTOMY/DECOMPRESSION MICRODISCECTOMY;  Surgeon: Tobi Bastos, MD;  Location: [facility];  Service: Orthopedics;  Laterality: Left;  Decompressive Lumbar Laminectomy L5-S1 on Left  . POLYPECTOMY N/A 06/03/2014   Procedure: POLYPECTOMY;  Surgeon: Rogene Houston, MD;  Location: [facility];  Service: Endoscopy;  Laterality: N/A;  . right wrist surgery for pinched nerve    . trigger finger surgery  12-07-11   rt. middle trigger finger release   Current Outpatient Medications on File Prior to Visit  Medication Sig Dispense Refill  . albuterol (PROVENTIL HFA;VENTOLIN HFA) 108 (90 Base) MCG/ACT inhaler INHALE 2 PUFFS EVERY 4 HOURS AS NEEDED FOR WHEEZING OR SHORTNESS OF BREATH 18 g 3  . albuterol (PROVENTIL) (2.5 MG/3ML) 0.083% nebulizer solution INHALE THE CONTENTS OF 1 VIAL VIA NEBULIZER EVERY 4 HOURS AS NEEDED FOR WHEEZING OR SHORTNESS OF BREATH 90 mL 1  . desmopressin (DDAVP) 0.1 MG tablet TAKE 1 TABLET AT BEDTIME FOR BLADDER 90 tablet 0  . DEXILANT 60 MG capsule TAKE 1 CAPSULE EVERY DAY 90 capsule 3  . diltiazem (CARDIZEM) 30 MG tablet Take 1 tablet (30 mg total) by mouth 2 (two) times daily. 180 tablet 1  . EASYMAX TEST test strip  USE TO TEST ONCE DAILY. 100 each 3  . gabapentin (NEURONTIN) 400 MG capsule TAKE 2 CAPSULES AT BEDTIME 180 capsule 3  . IBU 600 MG tablet TAKE 1 TABLET TWICE DAILY AS NEEDED 180 tablet 1  . nystatin (MYCOSTATIN/NYSTOP) powder APPLY  TOPICALLY TWICE DAILY AS NEEDED 60 g 2  . traMADol (ULTRAM) 50 MG tablet Take 1 tablet (50 mg total) by mouth every 6 (six) hours as needed. 15 tablet 0  . traZODone (DESYREL) 150 MG tablet TAKE 1 TABLET (150 MG TOTAL) BY MOUTH AT BEDTIME. 90 tablet 3   No current facility-administered medications on file prior to visit.    Allergies  Allergen Reactions  . Sulfa Antibiotics   . Nystatin Rash    Oral rash  . Percocet [Oxycodone-Acetaminophen]  Rash    Oral rash  . Vesicare [Solifenacin] Rash  . Vicodin [Hydrocodone-Acetaminophen] Rash    Oral rash   Social History   Socioeconomic History  . Marital status: Legally Separated    Spouse name: Not on file  . Number of children: Not on file  . Years of education: Not on file  . Highest education level: Not on file  Occupational History  . Not on file  Social Needs  . Financial resource strain: Not on file  . Food insecurity:    Worry: Not on file    Inability: Not on file  . Transportation needs:    Medical: Not on file    Non-medical: Not on file  Tobacco Use  . Smoking status: Current Every Day Smoker    Packs/day: 2.00    Years: 48.00    Pack years: 96.00    Types: Cigarettes    Start date: 01/07/1970  . Smokeless tobacco: Never Used  . Tobacco comment: since age 62. smoking 2-3 packs a day  Substance and Sexual Activity  . Alcohol use: No    Alcohol/week: 0.0 standard drinks  . Drug use: No  . Sexual activity: Yes    Birth control/protection: Surgical  Lifestyle  . Physical activity:    Days per week: Not on file    Minutes per session: Not on file  . Stress: Not on file  Relationships  . Social connections:    Talks on phone: Not on file    Gets together: Not on file    Attends religious service: Not on file    Active member of club or organization: Not on file    Attends meetings of clubs or organizations: Not on file    Relationship status: Not on file  . Intimate partner violence:    Fear of current or ex partner: Not on file    Emotionally abused: Not on file    Physically abused: Not on file    Forced sexual activity: Not on file  Other Topics Concern  . Not on file  Social History Narrative  . Not on file     Review of Systems  All other systems reviewed and are negative.      Objective:   Physical Exam Vitals signs reviewed.  Constitutional:      General: She is not in acute distress.    Appearance: Normal appearance. She is  not ill-appearing or diaphoretic.  Neck:     Musculoskeletal: Normal range of motion and neck supple.  Cardiovascular:     Rate and Rhythm: Normal rate and regular rhythm.     Pulses: Normal pulses.     Heart sounds: Normal heart sounds. No  murmur. No gallop.   Pulmonary:     Effort: Pulmonary effort is normal. No respiratory distress.     Breath sounds: Normal breath sounds. No stridor. No wheezing, rhonchi or rales.  Chest:     Chest wall: Tenderness present.    Musculoskeletal:     Cervical back: She exhibits tenderness, bony tenderness and pain.       Back:  Neurological:     Mental Status: She is alert.           Assessment & Plan:  Rib pain - Plan: DG Ribs Unilateral Right  Pain of right scapula - Plan: DG Scapula Right  I believe the patient has bruised ribs as well as a bruise scapula.  I will send the patient for an x-ray to rule out a fracture.  Although this would not change management it could provide [place] information about how long to expect the patient to have severe pain.  She is allergic to hydrocodone and oxycodone so I will give her Dilaudid.  She can take 2 to 4 mg every 8 hours as needed for severe pain.  She will be given 30 tablets.  I recommended that she take half a tablet first until she see how she responds to Dilaudid.  She has a history of a rash in her mouth whenever she takes hydrocodone or oxycodone.  She can also use Tylenol as needed for pain in addition to the ibuprofen she is already taking.  She is not taking tramadol and I recommended against mixing tramadol with these medications

## 2018-07-05 ENCOUNTER — Ambulatory Visit: Payer: Medicare HMO | Admitting: Family Medicine

## 2018-07-08 ENCOUNTER — Other Ambulatory Visit: Payer: Self-pay | Admitting: Family Medicine

## 2018-07-10 ENCOUNTER — Other Ambulatory Visit: Payer: Self-pay | Admitting: Family Medicine

## 2018-07-27 ENCOUNTER — Other Ambulatory Visit: Payer: Self-pay | Admitting: Family Medicine

## 2018-07-31 ENCOUNTER — Encounter: Payer: Self-pay | Admitting: Family Medicine

## 2018-07-31 ENCOUNTER — Ambulatory Visit (INDEPENDENT_AMBULATORY_CARE_PROVIDER_SITE_OTHER): Payer: Medicare HMO | Admitting: Family Medicine

## 2018-07-31 ENCOUNTER — Other Ambulatory Visit: Payer: Self-pay

## 2018-07-31 DIAGNOSIS — F5104 Psychophysiologic insomnia: Secondary | ICD-10-CM

## 2018-07-31 DIAGNOSIS — R7303 Prediabetes: Secondary | ICD-10-CM | POA: Diagnosis not present

## 2018-07-31 DIAGNOSIS — J418 Mixed simple and mucopurulent chronic bronchitis: Secondary | ICD-10-CM

## 2018-07-31 DIAGNOSIS — Z720 Tobacco use: Secondary | ICD-10-CM

## 2018-07-31 DIAGNOSIS — N3946 Mixed incontinence: Secondary | ICD-10-CM | POA: Diagnosis not present

## 2018-07-31 DIAGNOSIS — F418 Other specified anxiety disorders: Secondary | ICD-10-CM

## 2018-07-31 DIAGNOSIS — M48062 Spinal stenosis, lumbar region with neurogenic claudication: Secondary | ICD-10-CM | POA: Diagnosis not present

## 2018-07-31 DIAGNOSIS — I10 Essential (primary) hypertension: Secondary | ICD-10-CM | POA: Diagnosis not present

## 2018-07-31 NOTE — Progress Notes (Signed)
Virtual Visit via Telephone Note  I connected with RAYAH FINES on 07/31/18 at 12:04pm  by telephone and verified that I am speaking with the correct person using two identifiers.     Pt location: at home   Physician location:  In office, [facility], Vic Blackbird MD     On call: patient and physician   I discussed the limitations, risks, security and privacy concerns of performing an evaluation and management service by telephone and the availability of in person appointments. I also discussed with the patient that there may be a patient responsible charge related to this service. The patient expressed understanding and agreed to proceed.   History of Present Illness:  Her sister died a few weeks ago, she has good moments and bad, but overall has support from her siblings.Her sleep is fair as long as she takes her trazadone He is also on gabapentin 400 mg at bedtime for her chronic pain.  She did not want to be on any other medications at this time to help with her mood.  She is aware of therapy and counseling services through hospice for the family but does not want to pursue this at this time.    COPD- has not needed nebulizer, she has occocasionally used albuterol inhaler.  She does admit that she is smoking more.  Hypertension she is taking her blood pressure medication but is unable to check at home.  She is due for fasting labs she has prediabetes.   Observations/Objective: No acute distress she did become tearful discussing her sister.  Normal speech answer questions appropriately  Assessment and Plan:  History of Prediabetes we will follow-up for fasting labs in a few weeks.  Hypertension maintained on diltiazem by cardiology  Chronic insomnia in the setting of depression and grief she will continue with her trazodone at this time.  She does not want any other medications.  She is aware of counseling and therapy sessions provided by hospice  CHronic back  pain continue with ibuprofen and gabapentin.  OAB with incontience- maintained on DDVAP with good rsults   COPD- no difficulty with breathing, unforunately smoking more, prn albuterol, sister died from complications of COPD    Follow Up Instructions:       Tuesday June 9th for labs I discussed the assessment and treatment plan with the patient. The patient was provided an opportunity to ask questions and all were answered. The patient agreed with the plan and demonstrated an understanding of the instructions.   The patient was advised to call back or seek an in-person evaluation if the symptoms worsen or if the condition fails to improve as anticipated.  I provided 14 minutes of non-face-to-face time during this encounter. End time 12:18pm  Vic Blackbird, MD

## 2018-08-07 ENCOUNTER — Telehealth: Payer: Self-pay | Admitting: Cardiology

## 2018-08-07 ENCOUNTER — Emergency Department (HOSPITAL_COMMUNITY)
Admission: EM | Admit: 2018-08-07 | Discharge: 2018-08-07 | Disposition: A | Discharge status: Home / Self Care | Payer: Medicare HMO | Attending: Emergency Medicine | Admitting: Emergency Medicine

## 2018-08-07 ENCOUNTER — Encounter (HOSPITAL_COMMUNITY): Payer: Self-pay | Admitting: Emergency Medicine

## 2018-08-07 ENCOUNTER — Other Ambulatory Visit: Payer: Self-pay

## 2018-08-07 ENCOUNTER — Telehealth: Payer: Self-pay | Admitting: *Deleted

## 2018-08-07 DIAGNOSIS — I10 Essential (primary) hypertension: Secondary | ICD-10-CM | POA: Insufficient documentation

## 2018-08-07 DIAGNOSIS — F17210 Nicotine dependence, cigarettes, uncomplicated: Secondary | ICD-10-CM | POA: Insufficient documentation

## 2018-08-07 DIAGNOSIS — J449 Chronic obstructive pulmonary disease, unspecified: Secondary | ICD-10-CM | POA: Diagnosis not present

## 2018-08-07 DIAGNOSIS — Z79899 Other long term (current) drug therapy: Secondary | ICD-10-CM | POA: Insufficient documentation

## 2018-08-07 LAB — CBC WITH DIFFERENTIAL/PLATELET
Abs Immature Granulocytes: 0.03 10*3/uL (ref 0.00–0.07)
Basophils Absolute: 0.1 10*3/uL (ref 0.0–0.1)
Basophils Relative: 1 %
Eosinophils Absolute: 0.1 10*3/uL (ref 0.0–0.5)
Eosinophils Relative: 2 %
HCT: 44.5 % (ref 36.0–46.0)
Hemoglobin: 14.8 g/dL (ref 12.0–15.0)
Immature Granulocytes: 0 %
Lymphocytes Relative: 29 %
Lymphs Abs: 2.1 10*3/uL (ref 0.7–4.0)
MCH: 31.4 pg (ref 26.0–34.0)
MCHC: 33.3 g/dL (ref 30.0–36.0)
MCV: 94.5 fL (ref 80.0–100.0)
Monocytes Absolute: 0.5 10*3/uL (ref 0.1–1.0)
Monocytes Relative: 8 %
Neutro Abs: 4.3 10*3/uL (ref 1.7–7.7)
Neutrophils Relative %: 60 %
Platelets: 278 10*3/uL (ref 150–400)
RBC: 4.71 MIL/uL (ref 3.87–5.11)
RDW: 14.2 % (ref 11.5–15.5)
WBC: 7.1 10*3/uL (ref 4.0–10.5)
nRBC: 0 % (ref 0.0–0.2)

## 2018-08-07 LAB — COMPREHENSIVE METABOLIC PANEL
ALT: 12 U/L (ref 0–44)
AST: 13 U/L — ABNORMAL LOW (ref 15–41)
Albumin: 4 g/dL (ref 3.5–5.0)
Alkaline Phosphatase: 73 U/L (ref 38–126)
Anion gap: 10 (ref 5–15)
BUN: 17 mg/dL (ref 8–23)
CO2: 26 mmol/L (ref 22–32)
Calcium: 8.9 mg/dL (ref 8.9–10.3)
Chloride: 99 mmol/L (ref 98–111)
Creatinine, Ser: 0.97 mg/dL (ref 0.44–1.00)
GFR calc Af Amer: >60 mL/min (ref >60)
GFR calc non Af Amer: >60 mL/min (ref >60)
Glucose, Bld: 92 mg/dL (ref 70–99)
Potassium: 4 mmol/L (ref 3.5–5.1)
Sodium: 135 mmol/L (ref 135–145)
Total Bilirubin: 0.4 mg/dL (ref 0.3–1.2)
Total Protein: 7.4 g/dL (ref 6.5–8.1)

## 2018-08-07 MED ORDER — HYDRALAZINE HCL 20 MG/ML IJ SOLN
5.0000 mg | Freq: Once | INTRAMUSCULAR | Status: AC
  Administered 2018-08-07: 12:00:00 5 mg via INTRAVENOUS
  Filled 2018-08-07: qty 1

## 2018-08-07 MED ORDER — AMLODIPINE BESYLATE 5 MG PO TABS: 5.0000 mg | ORAL_TABLET | Freq: Every day | ORAL | 2 refills | Status: DC

## 2018-08-07 MED ORDER — AMLODIPINE BESYLATE 5 MG PO TABS
5.0000 mg | ORAL_TABLET | Freq: Once | ORAL | Status: AC
  Administered 2018-08-07: 13:00:00 5 mg via ORAL
  Filled 2018-08-07: qty 1

## 2018-08-07 NOTE — Telephone Encounter (Signed)
-----   Message from Alycia Rossetti, MD sent at 08/07/2018  1:26 PM EDT ----- Regarding: Get appt with cardiology for F/U HTN

## 2018-08-07 NOTE — Discharge Instructions (Signed)
Start back on blood pressure medication.  See your Physician for recheck.   Decrease smoking

## 2018-08-07 NOTE — Telephone Encounter (Signed)
Received VM from patient after hours on 08/06/2018. Reports that BP has been running high.   Contacted patient on 08/07/2018 to inquire. [phone]~ telephone.   Reports that she has been having high BP x2 days. States that her average is 200/100. Reports reading this AM is 202/113.   Denies chest pain, HA. Does endorse vision disturbances.  Advised to go to ER for evaluation as HTN is extremely elevated with vision disturbances.

## 2018-08-07 NOTE — Telephone Encounter (Signed)
Agree with ER visit. 

## 2018-08-07 NOTE — Telephone Encounter (Signed)
Virtual visit scheduled with Dr. Harl Bowie for 08/09/2018 at 8:30am.

## 2018-08-07 NOTE — Telephone Encounter (Signed)

## 2018-08-07 NOTE — ED Provider Notes (Signed)
[facility] EMERGENCY DEPARTMENT Provider Note   CSN: [id number] Arrival date & time: 08/07/18  1055    History   Chief Complaint Chief Complaint  Patient presents with  . Hypertension    HPI Paula Long is a 69 y.o. female.     The history is provided by the patient. No language interpreter was used.  Hypertension  This is a new problem. The current episode started more than 2 days ago. The problem occurs constantly. The problem has been gradually worsening. Pertinent negatives include no chest pain and no shortness of breath. Nothing aggravates the symptoms. Nothing relieves the symptoms. She has tried nothing for the symptoms. The treatment provided no relief.   Pt complains of elevated blood pressure  Past Medical History:  Diagnosis Date  . Anxiety   . Arthritis 12-07-11   arthritis,DDD,spinal stenosis  . Chronic back pain   . COPD (chronic obstructive pulmonary disease) ([place]) 12-07-11   pt. uses nebulizer as needed and inhaler daily  . Fibromyalgia    skin cancer  . GERD (gastroesophageal reflux disease)   . Hemorrhoids   . Hypertension 12-07-11   presently no meds  . Normal cardiac stress test 06/2009  . Shortness of breath 12-07-11   less now, occ wheezes  . Skin cancer    melanoma.  face 2014    Patient Active Problem List   Diagnosis Date Noted  . Depression with anxiety 05/22/2014  . Family hx of colon cancer 02/19/2014  . IBS (irritable bowel syndrome) 01/23/2014  . Urinary incontinence, mixed 10/13/2013  . Chronic insomnia 09/12/2013  . Boil 02/27/2013  . Skin lesion of face 10/22/2012  . Hemorrhoids 10/22/2012  . Intertrigo 06/21/2012  . Neck pain 06/21/2012  . Bunion of great toe 06/21/2012  . OAB (overactive bladder) 03/15/2012  . Spinal stenosis, lumbar region, with neurogenic claudication 12/11/2011  . Essential hypertension, benign 10/30/2011  . Chronic back pain 10/30/2011  . GERD (gastroesophageal reflux disease) 10/30/2011  . COPD  (chronic obstructive pulmonary disease) ([place]) 10/30/2011  . Tobacco user 10/30/2011  . Obesity 10/30/2011  . Abnormal mammogram 10/30/2011    Past Surgical History:  Procedure Laterality Date  . ABDOMINAL HYSTERECTOMY    . BACK SURGERY  12-07-11   2'10 L5 x2  . COLONOSCOPY WITH PROPOFOL N/A 06/03/2014   Procedure: COLONOSCOPY WITH PROPOFOL; IN CECUM AT 0750; TOTAL WITHDRAWAL TIME 16 MINUTES;  Surgeon: Rogene Houston, MD;  Location: [facility];  Service: Endoscopy;  Laterality: N/A;  . left ankle surgery     tendonitis  . LUMBAR LAMINECTOMY/DECOMPRESSION MICRODISCECTOMY  12/11/2011   Procedure: LUMBAR LAMINECTOMY/DECOMPRESSION MICRODISCECTOMY;  Surgeon: Tobi Bastos, MD;  Location: [facility];  Service: Orthopedics;  Laterality: Left;  Decompressive Lumbar Laminectomy L5-S1 on Left  . POLYPECTOMY N/A 06/03/2014   Procedure: POLYPECTOMY;  Surgeon: Rogene Houston, MD;  Location: [facility];  Service: Endoscopy;  Laterality: N/A;  . right wrist surgery for pinched nerve    . trigger finger surgery  12-07-11   rt. middle trigger finger release     OB History    Gravida  3   Para  2   Term  2   Preterm      AB  1   Living        SAB  1   TAB      Ectopic      Multiple      Live Births  Home Medications    Prior to Admission medications   Medication Sig Start Date End Date Taking? Authorizing Provider  albuterol (PROVENTIL HFA;VENTOLIN HFA) 108 (90 Base) MCG/ACT inhaler INHALE 2 PUFFS EVERY 4 HOURS AS NEEDED FOR WHEEZING OR SHORTNESS OF BREATH 05/27/18  Yes [place], Lonell Grandchild F, MD  albuterol (PROVENTIL) (2.5 MG/3ML) 0.083% nebulizer solution INHALE THE CONTENTS OF 1 VIAL VIA NEBULIZER EVERY 4 HOURS AS NEEDED FOR WHEEZING OR SHORTNESS OF BREATH 09/18/16  Yes [place], Modena Nunnery, MD  desmopressin (DDAVP) 0.1 MG tablet TAKE 1 TABLET AT BEDTIME FOR BLADDER 07/10/18  Yes [place], Modena Nunnery, MD  DEXILANT 60 MG capsule TAKE 1 CAPSULE EVERY DAY 07/09/18  Yes [place], Modena Nunnery, MD   diltiazem (CARDIZEM) 30 MG tablet Take 1 tablet (30 mg total) by mouth 2 (two) times daily. 05/14/18  Yes Arnoldo Lenis, MD  gabapentin (NEURONTIN) 400 MG capsule TAKE 2 CAPSULES AT BEDTIME 03/06/18  Yes [place], Modena Nunnery, MD  IBU 600 MG tablet TAKE 1 TABLET TWICE DAILY AS NEEDED 05/09/18  Yes [place], Modena Nunnery, MD  nystatin (MYCOSTATIN/NYSTOP) powder APPLY  TOPICALLY TWICE DAILY AS NEEDED 07/29/18  Yes [place], Modena Nunnery, MD  traZODone (DESYREL) 150 MG tablet TAKE 1 TABLET (150 MG TOTAL) BY MOUTH AT BEDTIME. 01/07/18  Yes [place], Modena Nunnery, MD  amLODipine (NORVASC) 5 MG tablet Take 1 tablet (5 mg total) by mouth daily. 08/07/18 08/07/19  Fransico Meadow, PA-C  EASYMAX TEST test strip USE TO TEST ONCE DAILY. Patient not taking: Reported on 08/07/2018 06/03/15   Susy Frizzle, MD    Family History Family History  Problem Relation Age of Onset  . Cancer Mother        uterine  . Cancer Father        lung    Social History Social History   Tobacco Use  . Smoking status: Current Every Day Smoker    Packs/day: 2.00    Years: 48.00    Pack years: 96.00    Types: Cigarettes    Start date: 01/07/1970  . Smokeless tobacco: Never Used  . Tobacco comment: since age 67. smoking 2-3 packs a day  Substance Use Topics  . Alcohol use: No    Alcohol/week: 0.0 standard drinks  . Drug use: No     Allergies   Sulfa antibiotics; Nystatin; Percocet [oxycodone-acetaminophen]; Vesicare [solifenacin]; and Vicodin [hydrocodone-acetaminophen]   Review of Systems Review of Systems  Respiratory: Negative for chest tightness and shortness of breath.   Cardiovascular: Negative for chest pain.  All other systems reviewed and are negative.    Physical Exam Updated Vital Signs BP (!) 155/72   Pulse 68   Temp 97.7 F (36.5 C) (Oral)   Resp 19   Ht 5\' 6"  (1.676 m)   Wt 90.7 kg   SpO2 98%   BMI 32.28 kg/m   Physical Exam Vitals signs and nursing note reviewed.  Constitutional:       Appearance: Normal appearance. She is well-developed.  HENT:     Head: Normocephalic.     Right Ear: Tympanic membrane normal.     Left Ear: Tympanic membrane normal.     Nose: Nose normal.     Mouth/Throat:     Mouth: Mucous membranes are moist.  Eyes:     Pupils: Pupils are equal, round, and reactive to light.  Neck:     Musculoskeletal: Normal range of motion.  Cardiovascular:     Rate and Rhythm: Normal rate.  Pulses: Normal pulses.  Pulmonary:     Effort: Pulmonary effort is normal.  Abdominal:     General: Abdomen is flat. There is no distension.  Musculoskeletal: Normal range of motion.  Skin:    General: Skin is warm.  Neurological:     Mental Status: She is alert and oriented to person, place, and time.  Psychiatric:        Mood and Affect: Mood normal.      ED Treatments / Results  Labs (all labs ordered are listed, but only abnormal results are displayed) Labs Reviewed  COMPREHENSIVE METABOLIC PANEL - Abnormal; Notable for the following components:      Result Value   AST 13 (*)    All other components within normal limits  CBC WITH DIFFERENTIAL/PLATELET    EKG EKG Interpretation  Date/Time:  Wednesday Aug 07 2018 11:10:09 EDT Ventricular Rate:  69 PR Interval:    QRS Duration: 89 QT Interval:  386 QTC Calculation: 414 R Axis:   49 Text Interpretation:  Sinus rhythm Consider left atrial enlargement Abnormal R-wave progression, early transition Baseline wander When compared with ECG of 05/29/2014 No significant change was found Confirmed by Francine Graven [phone]) on 08/07/2018 11:36:29 AM   Radiology No results found.  Procedures Procedures (including critical care time)  Medications Ordered in ED Medications  hydrALAZINE (APRESOLINE) injection 5 mg (5 mg Intravenous Given 08/07/18 1144)     Initial Impression / Assessment and Plan / ED Course  I have reviewed the triage vital signs and the nursing notes.  Pertinent labs & imaging  results that were available during my care of the patient were reviewed by me and considered in my medical decision making (see chart for details).        MDM  Ekg no acute abnormality, labs normal    Final Clinical Impressions(s) / ED Diagnoses   Final diagnoses:  Hypertension, unspecified type    ED Discharge Orders         Ordered    amLODipine (NORVASC) 5 MG tablet  Daily     08/07/18 1226        An After Visit Summary was printed and given to the patient.    Fransico Meadow, PA-C 08/07/18 [place], [place], DO 08/10/18 1645

## 2018-08-07 NOTE — ED Triage Notes (Signed)
Patient reports BP of 200/100 at home, blurred vision, and headache. Onset of symptoms yesterday.

## 2018-08-09 ENCOUNTER — Ambulatory Visit: Payer: Medicare HMO | Admitting: Family Medicine

## 2018-08-09 ENCOUNTER — Encounter: Payer: Self-pay | Admitting: Cardiology

## 2018-08-09 ENCOUNTER — Telehealth (INDEPENDENT_AMBULATORY_CARE_PROVIDER_SITE_OTHER): Payer: Medicare HMO | Admitting: Cardiology

## 2018-08-09 VITALS — BP 160/80 | HR 80 | Ht 66.0 in | Wt 200.0 lb

## 2018-08-09 DIAGNOSIS — R002 Palpitations: Secondary | ICD-10-CM

## 2018-08-09 DIAGNOSIS — I10 Essential (primary) hypertension: Secondary | ICD-10-CM

## 2018-08-09 DIAGNOSIS — R600 Localized edema: Secondary | ICD-10-CM

## 2018-08-09 MED ORDER — CHLORTHALIDONE 25 MG PO TABS: 12.5000 mg | ORAL_TABLET | Freq: Every day | ORAL | 1 refills | Status: DC

## 2018-08-09 MED ORDER — DILTIAZEM HCL 60 MG PO TABS: 60.0000 mg | ORAL_TABLET | Freq: Two times a day (BID) | ORAL | 1 refills | Status: DC

## 2018-08-09 NOTE — Patient Instructions (Signed)
Your physician recommends that you schedule a follow-up appointment in: [place] has recommended you make the following change in your medication:   STOP AMLODIPINE   INCREASE DILTIAZEM 60 MG TWICE DAILY   START CHLORTHALIDONE 12.5 MG (1/2 TABLET) DAILY   Your physician recommends that you return for lab work in: [place] BMP/MG   Your physician has requested that you regularly monitor and record your blood pressure readings at home FOR 1 [place]. Please use the same machine at the same time of day to check your readings   Thank you for choosing [facility]!!

## 2018-08-09 NOTE — Addendum Note (Signed)
Addended by: Julian Hy T on: 08/09/2018 08:51 AM   Modules accepted: Orders

## 2018-08-09 NOTE — Progress Notes (Signed)
Virtual Visit via Telephone Note   This visit type was conducted due to national recommendations for restrictions regarding the COVID-19 Pandemic (e.g. social distancing) in an effort to limit this patient's exposure and mitigate transmission in our community.  Due to her co-morbid illnesses, this patient is at least at moderate risk for complications without adequate follow up.  This format is felt to be most appropriate for this patient at this time.  The patient did not have access to video technology/had technical difficulties with video requiring transitioning to audio format only (telephone).  All issues noted in this document were discussed and addressed.  No physical exam could be performed with this format.  Please refer to the patient's chart for her  consent to telehealth for [facility].   Date:  08/09/2018   ID:  Paula Long, DOB Sep 10, 1950, MRN [id number]  Patient Location: Home Provider Location: Home  PCP:  Alycia Rossetti, MD  Cardiologist:  Carlyle Dolly, MD  Electrophysiologist:  None   Evaluation Performed:  Follow-Up Visit  Chief Complaint:  HTN, ER follow up  History of Present Illness:    Paula Long is a 68 y.o. female seen today for follow up of the following medical problems.   1.Palpitations - started a few months ago - fluttering in chest, occurs few times week. Lasts a few seconds.Some SOB at times  - Jan 2020 event monitor no symptoms reported, no arrhythmias - has been started on diltiazem in the past for both palpitatoins and HTN - no recent symptoms    2. HTN - high bp's recently up to 200s/100s. First thing in AM 193/93 before meds. Later 225/105, then 186/90 and 168/70. HRs 70-80s - seen in ER 08/07/18 with elevated bp's. - recently restarted on norvasc by another provider   3. LE edema - started about 1 month ago    The patient does not have symptoms concerning for COVID-19 infection (fever, chills, cough, or new  shortness of breath).    Past Medical History:  Diagnosis Date  . Anxiety   . Arthritis 12-07-11   arthritis,DDD,spinal stenosis  . Chronic back pain   . COPD (chronic obstructive pulmonary disease) ([place]) 12-07-11   pt. uses nebulizer as needed and inhaler daily  . Fibromyalgia    skin cancer  . GERD (gastroesophageal reflux disease)   . Hemorrhoids   . Hypertension 12-07-11   presently no meds  . Normal cardiac stress test 06/2009  . Shortness of breath 12-07-11   less now, occ wheezes  . Skin cancer    melanoma.  face 2014   Past Surgical History:  Procedure Laterality Date  . ABDOMINAL HYSTERECTOMY    . BACK SURGERY  12-07-11   2'10 L5 x2  . COLONOSCOPY WITH PROPOFOL N/A 06/03/2014   Procedure: COLONOSCOPY WITH PROPOFOL; IN CECUM AT 0750; TOTAL WITHDRAWAL TIME 16 MINUTES;  Surgeon: Rogene Houston, MD;  Location: [facility];  Service: Endoscopy;  Laterality: N/A;  . left ankle surgery     tendonitis  . LUMBAR LAMINECTOMY/DECOMPRESSION MICRODISCECTOMY  12/11/2011   Procedure: LUMBAR LAMINECTOMY/DECOMPRESSION MICRODISCECTOMY;  Surgeon: Tobi Bastos, MD;  Location: [facility];  Service: Orthopedics;  Laterality: Left;  Decompressive Lumbar Laminectomy L5-S1 on Left  . POLYPECTOMY N/A 06/03/2014   Procedure: POLYPECTOMY;  Surgeon: Rogene Houston, MD;  Location: [facility];  Service: Endoscopy;  Laterality: N/A;  . right wrist surgery for pinched nerve    . trigger finger surgery  12-07-11  rt. middle trigger finger release     No outpatient medications have been marked as taking for the 08/09/18 encounter (Appointment) with Arnoldo Lenis, MD.     Allergies:   Sulfa antibiotics; Nystatin; Percocet [oxycodone-acetaminophen]; Vesicare [solifenacin]; and Vicodin [hydrocodone-acetaminophen]   Social History   Tobacco Use  . Smoking status: Current Every Day Smoker    Packs/day: 2.00    Years: 48.00    Pack years: 96.00    Types: Cigarettes    Start date: 01/07/1970  . Smokeless  tobacco: Never Used  . Tobacco comment: since age 69. smoking 2-3 packs a day  Substance Use Topics  . Alcohol use: No    Alcohol/week: 0.0 standard drinks  . Drug use: No     Family Hx: The patient's family history includes Cancer in her father and mother.  ROS:   Please see the history of present illness.     All other systems reviewed and are negative.   Prior CV studies:   The following studies were reviewed today:  08/2017 echo Study Conclusions  - Left ventricle: The cavity size was normal. Wall thickness was normal. Systolic function was normal. The estimated ejection fraction was in the range of 55% to 60%. Wall motion was normal; there were no regional wall motion abnormalities. Left ventricular diastolic function parameters were normal. - Aortic valve: Mildly calcified annulus. Mildly thickened leaflets. Valve area (VTI): 2.58 cm^2. Valve area (Vmax): 2.54 cm^2. - Mitral valve: Mildly calcified annulus. Normal thickness leaflets . - Left atrium: The atrium was mildly dilated. - Technically adequate study.  Jan 2020 event monitor  14 day event monitor  Min HR 46, Max HR 123, Avg HR 73. Min HR occurred in very early AM hours presumably while sleeping  No symptoms reported  Telemetry tracings show sinus rhythm  No significant arrhythmias  Labs/Other Tests and Data Reviewed:    EKG:  No ECG reviewed.  Recent Labs: 02/25/2018: TSH 2.65 08/07/2018: ALT 12; BUN 17; Creatinine, Ser 0.97; Hemoglobin 14.8; Platelets 278; Potassium 4.0; Sodium 135   Recent Lipid Panel Lab Results  Component Value Date/Time   CHOL 126 02/25/2018 03:44 PM   TRIG 60 02/25/2018 03:44 PM   HDL 55 02/25/2018 03:44 PM   CHOLHDL 2.3 02/25/2018 03:44 PM   LDLCALC 57 02/25/2018 03:44 PM    Wt Readings from Last 3 Encounters:  08/07/18 200 lb (90.7 kg)  07/04/18 201 lb (91.2 kg)  05/14/18 206 lb 6.4 oz (93.6 kg)     Objective:    Vital Signs:   Today's  Vitals   08/09/18 0807  BP: (!) 160/80  Pulse: 80  Weight: 200 lb (90.7 kg)  Height: 5\' 6"  (1.676 m)   Body mass index is 32.28 kg/m.   ASSESSMENT & PLAN:    1. Palpitations - no significant symptoms - due to HTN increase diltiazem to 60mg  bid.    2. HTN -severely elevated - since on dilt would stop norvasc. With HTN and LE edema would start chlorthalidone 12.5mg  daily, check BMET/Mg in 2 weeks   3. LE edema - follow with starting chlorthalidone - weights overall stable, no significant pulmonary symptoms. If progresses could consider echo  COVID-19 Education: The signs and symptoms of COVID-19 were discussed with the patient and how to seek care for testing (follow up with PCP or arrange E-visit).  The importance of social distancing was discussed today.  Time:   Today, I have spent 18 minutes with the patient with  telehealth technology discussing the above problems.     Medication Adjustments/Labs and Tests Ordered: Current medicines are reviewed at length with the patient today.  Concerns regarding medicines are outlined above.   Tests Ordered: No orders of the defined types were placed in this encounter.   Medication Changes: No orders of the defined types were placed in this encounter.   Disposition:  Follow up 6 weeks  Signed, Carlyle Dolly, MD  08/09/2018 8:06 AM    [place]

## 2018-08-14 ENCOUNTER — Telehealth: Payer: Self-pay | Admitting: Cardiology

## 2018-08-14 NOTE — Telephone Encounter (Signed)
Says she has been taking chlorthalidone 25 mg daily - bottle did say to take half tablet daily however she has been taking 1 25 mg tablet daily

## 2018-08-14 NOTE — Telephone Encounter (Signed)
Calling to give BP readings    

## 2018-08-14 NOTE — Telephone Encounter (Signed)
Pt called to give update on BP since last week medication changes BP 150-165/81-87 HR 78-81 - says she has been taking chlorthalidone 25 mg daily and dilt 60 mg bid - stopped amlodipine

## 2018-08-14 NOTE — Telephone Encounter (Signed)
Change Rx of chlorthaldone to 25mg  daily since taking that way and bp is elevated. Need to start an additonal bp med, please start lisinopril 20mg  daily. Have her get the labs we had ordered next week and udpate [place] on her bp's then   Zandra Abts MD

## 2018-08-14 NOTE — Telephone Encounter (Signed)
Chlorthalidone was prescribed as 12.5mg  daily, can we clarify if she is taking 12.5mg  or truly 25mg ?   Zandra Abts MD

## 2018-08-14 NOTE — Telephone Encounter (Signed)
LM to return call.

## 2018-08-15 MED ORDER — LISINOPRIL 20 MG PO TABS: 20.0000 mg | ORAL_TABLET | Freq: Every day | ORAL | 1 refills | Status: DC

## 2018-08-15 NOTE — Telephone Encounter (Signed)
Pt voiced understanding - updated medication list and sent Lisinopril to [place] - will have labs done at Dr Janeann Forehand office and update [place] on BP next week

## 2018-08-27 ENCOUNTER — Other Ambulatory Visit: Payer: Self-pay

## 2018-08-27 ENCOUNTER — Other Ambulatory Visit: Payer: Medicare HMO

## 2018-08-27 DIAGNOSIS — R7303 Prediabetes: Secondary | ICD-10-CM

## 2018-08-27 DIAGNOSIS — I10 Essential (primary) hypertension: Secondary | ICD-10-CM | POA: Diagnosis not present

## 2018-08-28 ENCOUNTER — Other Ambulatory Visit: Payer: Self-pay | Admitting: Cardiology

## 2018-08-28 ENCOUNTER — Other Ambulatory Visit: Payer: Self-pay | Admitting: Family Medicine

## 2018-08-28 LAB — CBC WITH DIFFERENTIAL/PLATELET
Absolute Monocytes: 577 cells/uL (ref 200–950)
Basophils Absolute: 81 cells/uL (ref 0–200)
Basophils Relative: 1.1 %
Eosinophils Absolute: 81 cells/uL (ref 15–500)
Eosinophils Relative: 1.1 %
HCT: 40.3 % (ref 35.0–45.0)
Hemoglobin: 13.3 g/dL (ref 11.7–15.5)
Lymphs Abs: 1769 cells/uL (ref 850–3900)
MCH: 30.8 pg (ref 27.0–33.0)
MCHC: 33 g/dL (ref 32.0–36.0)
MCV: 93.3 fL (ref 80.0–100.0)
MPV: 10.3 fL (ref 7.5–12.5)
Monocytes Relative: 7.8 %
Neutro Abs: 4891 cells/uL (ref 1500–7800)
Neutrophils Relative %: 66.1 %
Platelets: 286 10*3/uL (ref 140–400)
RBC: 4.32 10*6/uL (ref 3.80–5.10)
RDW: 13.1 % (ref 11.0–15.0)
Total Lymphocyte: 23.9 %
WBC: 7.4 10*3/uL (ref 3.8–10.8)

## 2018-08-28 LAB — LIPID PANEL
Cholesterol: 123 mg/dL (ref <200)
HDL: 55 mg/dL (ref >=50)
LDL Cholesterol (Calc): 55 mg/dL (calc)
Non-HDL Cholesterol (Calc): 68 mg/dL (calc) (ref <130)
Total CHOL/HDL Ratio: 2.2 (calc) (ref <5.0)
Triglycerides: 58 mg/dL (ref <150)

## 2018-08-28 LAB — COMPREHENSIVE METABOLIC PANEL
AG Ratio: 1.4 (calc) (ref 1.0–2.5)
ALT: 5 U/L — ABNORMAL LOW (ref 6–29)
AST: 11 U/L (ref 10–35)
Albumin: 3.7 g/dL (ref 3.6–5.1)
Alkaline phosphatase (APISO): 62 U/L (ref 37–153)
BUN/Creatinine Ratio: 20 (calc) (ref 6–22)
BUN: 26 mg/dL — ABNORMAL HIGH (ref 7–25)
CO2: 25 mmol/L (ref 20–32)
Calcium: 8.8 mg/dL (ref 8.6–10.4)
Chloride: 94 mmol/L — ABNORMAL LOW (ref 98–110)
Creat: 1.3 mg/dL — ABNORMAL HIGH (ref 0.50–0.99)
Globulin: 2.7 g/dL (calc) (ref 1.9–3.7)
Glucose, Bld: 79 mg/dL (ref 65–99)
Potassium: 4.6 mmol/L (ref 3.5–5.3)
Sodium: 129 mmol/L — ABNORMAL LOW (ref 135–146)
Total Bilirubin: 0.3 mg/dL (ref 0.2–1.2)
Total Protein: 6.4 g/dL (ref 6.1–8.1)

## 2018-08-28 LAB — HEMOGLOBIN A1C
Hgb A1c MFr Bld: 5.6 % of total Hgb (ref <5.7)
Mean Plasma Glucose: 114 (calc)
eAG (mmol/L): 6.3 (calc)

## 2018-08-29 ENCOUNTER — Telehealth: Payer: Self-pay | Admitting: *Deleted

## 2018-08-29 NOTE — Telephone Encounter (Signed)
-----   Message from Arnoldo Lenis, MD sent at 08/29/2018  9:13 AM EDT ----- Labs from Dr Buelah Manis reviewed, I dont think she is tolerating the chlorthalidone we had started. I don't see it on her list, Hardie Pulley can we sort out if she is still taking or not and let me know. If still taking I would stop and continue the new bp medicine lisinopril that we had started. Please update me.   Zandra Abts MD ----- Message ----- From: Alycia Rossetti, MD Sent: 08/28/2018   3:38 PM EDT To: Arnoldo Lenis, MD, [place] Lathe Six, LPN  Sodium level is a little low and creatinine has gone up Make sure she is drinking water with her new blood pressure medication She needs to have repeat lab done in 1 week- schedule OV

## 2018-08-29 NOTE — Telephone Encounter (Signed)
We will live with those bp's for now until we see that her labs get back to normal, be sure she has the repeat labs that Dr Buelah Manis had asked for I believe in 1 week   Zandra Abts MD

## 2018-08-29 NOTE — Telephone Encounter (Signed)
Confirmed with pt she stopped chlorthalidone 1 week ago - says she has been taking lisinopril as directed. Says BP has been 140s-150s/60s-70s HR 80s - says she normally only drinks soda and will try to replace with water.

## 2018-08-30 ENCOUNTER — Telehealth: Payer: Self-pay | Admitting: Cardiology

## 2018-08-30 MED ORDER — DILTIAZEM HCL 60 MG PO TABS: 60.0000 mg | ORAL_TABLET | Freq: Two times a day (BID) | ORAL | 1 refills | Status: DC

## 2018-08-30 NOTE — Telephone Encounter (Signed)
diltiazem (CARDIZEM) 60 MG tablet  Patient called stating that [employer] told her they need prior authorization for med

## 2018-08-30 NOTE — Telephone Encounter (Signed)
Pt only wanted to have diltiazem sent to [facility] mail order pharmacy so that she could get cheaper

## 2018-09-04 ENCOUNTER — Other Ambulatory Visit: Payer: Self-pay

## 2018-09-04 ENCOUNTER — Encounter: Payer: Self-pay | Admitting: Family Medicine

## 2018-09-04 ENCOUNTER — Ambulatory Visit (INDEPENDENT_AMBULATORY_CARE_PROVIDER_SITE_OTHER): Payer: Medicare HMO | Admitting: Family Medicine

## 2018-09-04 VITALS — BP 170/76 | HR 68 | Temp 97.8°F | Resp 14 | Ht 65.0 in | Wt 208.0 lb

## 2018-09-04 DIAGNOSIS — I10 Essential (primary) hypertension: Secondary | ICD-10-CM

## 2018-09-04 DIAGNOSIS — R609 Edema, unspecified: Secondary | ICD-10-CM | POA: Diagnosis not present

## 2018-09-04 MED ORDER — FUROSEMIDE 20 MG PO TABS: 20.0000 mg | ORAL_TABLET | Freq: Every day | ORAL | 0 refills | Status: DC | PRN

## 2018-09-04 NOTE — Patient Instructions (Addendum)
Take the lasix 20mg  once a day for 3 days for the extra fluid Elevate your legs Keep taking the lisinipril  Keep checking your blood pressure  F/U PENDING RESULTS

## 2018-09-04 NOTE — Progress Notes (Signed)
   Subjective:    Patient ID: Paula Long, female    DOB: September 26, 1950, 68 y.o.   MRN: [id number]  Patient presents for Follow-up (kidney function)   Pt here to f/u renal function, she is off the chlorthalidone for over a month, she is staking lisinopril 20mg  daily. Drinking more water but blood pressure is still up and down 150-170/ 70-80's recently. HR 75-80 She has been walking on treadmill She has fluid on both legs present for the past couple of weeks but feels like she is urinating a lot, painful to walk on BMET 1 week ago, Sodium was 129 and BUN 26, Cr 1.30 Taking diltiazem 60mg  twice a day    Review Of Systems:  GEN- denies fatigue, fever, weight loss,weakness, recent illness HEENT- denies eye drainage, change in vision, nasal discharge, CVS- denies chest pain, palpitations RESP- denies SOB, cough, wheeze ABD- denies N/V, change in stools, abd pain GU- denies dysuria, hematuria, dribbling, incontinence MSK- + joint pain, muscle aches, injury Neuro- denies headache, dizziness, syncope, seizure activity       Objective:    BP (!) 170/76   Pulse 68   Temp 97.8 F (36.6 C) (Oral)   Resp 14   Ht 5\' 5"  (1.651 m)   Wt 208 lb (94.3 kg)   SpO2 96%   BMI 34.61 kg/m  GEN- NAD, alert and oriented x3 HEENT- PERRL, EOMI, non injected sclera, pink conjunctiva, MMM, oropharynx clear Neck- Supple, no thyromegaly, no JVD CVS- RRR, no murmur RESP-CTAB EXT- 1+ non pitting  edema Pulses- Radial, DP- 2+        Assessment & Plan:      Problem List Items Addressed This Visit      Unprioritized   Essential hypertension, benign - Primary   Relevant Medications   furosemide (LASIX) 20 MG tablet   Other Relevant Orders   Basic metabolic panel   Peripheral edema    Uncontrolled BP and with fluid overload,weight up 7lbs since April No respiratory symptoms Her sodium was a little low and creatinine mildly elevated She was taken off norvasc and put on diltiazem Off  chlorthalidone, though did not have fluid overload with this  Plan to remove fluid with lasix 20mg  once a day for 3 days Elevate feet Check NA and Renal function Pending results may be able to increase the lisinipril, if renal function worsening, will try hydralazine temporarily  HR already in 60's prefer not to try raising diltiazem          Note: This dictation was prepared with Dragon dictation along with smaller phrase technology. Any transcriptional errors that result from this process are unintentional.

## 2018-09-04 NOTE — Assessment & Plan Note (Addendum)
Uncontrolled BP and with fluid overload,weight up 7lbs since April No respiratory symptoms Her sodium was a little low and creatinine mildly elevated She was taken off norvasc and put on diltiazem Off chlorthalidone, though did not have fluid overload with this  Plan to remove fluid with lasix 20mg  once a day for 3 days Elevate feet Check NA and Renal function Pending results may be able to increase the lisinipril, if renal function worsening, will try hydralazine temporarily  HR already in 60's prefer not to try raising diltiazem

## 2018-09-05 ENCOUNTER — Other Ambulatory Visit: Payer: Self-pay | Admitting: Family Medicine

## 2018-09-05 LAB — BASIC METABOLIC PANEL
BUN/Creatinine Ratio: 17 (calc) (ref 6–22)
BUN: 27 mg/dL — ABNORMAL HIGH (ref 7–25)
CO2: 28 mmol/L (ref 20–32)
Calcium: 9.4 mg/dL (ref 8.6–10.4)
Chloride: 97 mmol/L — ABNORMAL LOW (ref 98–110)
Creat: 1.56 mg/dL — ABNORMAL HIGH (ref 0.50–0.99)
Glucose, Bld: 95 mg/dL (ref 65–99)
Potassium: 5.6 mmol/L — ABNORMAL HIGH (ref 3.5–5.3)
Sodium: 132 mmol/L — ABNORMAL LOW (ref 135–146)

## 2018-09-10 ENCOUNTER — Other Ambulatory Visit: Payer: Self-pay | Admitting: *Deleted

## 2018-09-10 MED ORDER — HYDRALAZINE HCL 25 MG PO TABS: 25.0000 mg | ORAL_TABLET | Freq: Three times a day (TID) | ORAL | 1 refills | Status: DC

## 2018-09-16 ENCOUNTER — Other Ambulatory Visit: Payer: Self-pay

## 2018-09-16 ENCOUNTER — Ambulatory Visit (INDEPENDENT_AMBULATORY_CARE_PROVIDER_SITE_OTHER): Payer: Medicare HMO | Admitting: Family Medicine

## 2018-09-16 ENCOUNTER — Encounter: Payer: Self-pay | Admitting: Family Medicine

## 2018-09-16 VITALS — BP 154/70 | HR 88 | Temp 99.0°F | Resp 14 | Ht 65.0 in | Wt 207.0 lb

## 2018-09-16 DIAGNOSIS — H6121 Impacted cerumen, right ear: Secondary | ICD-10-CM

## 2018-09-16 DIAGNOSIS — R609 Edema, unspecified: Secondary | ICD-10-CM | POA: Diagnosis not present

## 2018-09-16 DIAGNOSIS — F418 Other specified anxiety disorders: Secondary | ICD-10-CM

## 2018-09-16 DIAGNOSIS — E875 Hyperkalemia: Secondary | ICD-10-CM | POA: Diagnosis not present

## 2018-09-16 DIAGNOSIS — I10 Essential (primary) hypertension: Secondary | ICD-10-CM | POA: Diagnosis not present

## 2018-09-16 DIAGNOSIS — N179 Acute kidney failure, unspecified: Secondary | ICD-10-CM

## 2018-09-16 MED ORDER — DILTIAZEM HCL 60 MG PO TABS: ORAL_TABLET | ORAL | 1 refills | Status: DC

## 2018-09-16 MED ORDER — ESCITALOPRAM OXALATE 5 MG PO TABS: 5.0000 mg | ORAL_TABLET | Freq: Every day | ORAL | 2 refills | Status: DC

## 2018-09-16 NOTE — Assessment & Plan Note (Signed)
Restart lexapro has been on in the past Start 5mg  daily

## 2018-09-16 NOTE — Patient Instructions (Addendum)
Take Cardizem 2 in the morning  ( 120mg ) and 1 in the evening  Continue hydralazine three times a day  No more fluid pill for now  Call if you want to go to therapist for grief counseling Start lexapro once a day for stress F/U 4 weeks

## 2018-09-16 NOTE — Progress Notes (Signed)
   Subjective:    Patient ID: Paula Long, female    DOB: 11/10/1950, 68 y.o.   MRN: [id number]  Patient presents for Follow-up (is fasting)   Pt here to f/u her blood pressure, lisinopril stopped due to ARF and hyperkalemia. She also took lasix for 3 days for leg swelling and swelling has gone down   now on hydralazine TID and cardizem 60mg  BID   At home systolic 962-229/ 79-89  She has been under a lot of stress with sister passing, finds herself crying a lot, no sleeping well    Wants me to check her ear   Often finds herself drooling out side of her face , no changein sensation, no change in speech, no difficulty swallowing     Review Of Systems:  GEN- denies fatigue, fever, weight loss,weakness, recent illness HEENT- denies eye drainage, change in vision, nasal discharge, CVS- denies chest pain, palpitations RESP- denies SOB, cough, wheeze ABD- denies N/V, change in stools, abd pain GU- denies dysuria, hematuria, dribbling, incontinence MSK- denies joint pain, muscle aches, injury Neuro- denies headache, dizziness, syncope, seizure activity       Objective:    BP (!) 154/70 (BP Location: Right Arm, Patient Position: Sitting, Cuff Size: Normal)   Pulse 88   Temp 99 F (37.2 C) (Oral)   Resp 14   Ht 5\' 5"  (1.651 m)   Wt 207 lb (93.9 kg)   SpO2 98%   BMI 34.45 kg/m  GEN- NAD, alert and oriented x3   Repeat 160/68  HEENT- PERRL, EOMI, non injected sclera, pink conjunctiva, MMM, oropharynx clear, RIGHT TM impacted with wax Neck- Supple, no thyromegaly CVS- RRR, systolic murmur RESP-CTAB Psych- normal affect and mood EXT- trace edema Pulses- Radial, DP- 2+        Assessment & Plan:      Problem List Items Addressed This Visit      Unprioritized   Depression with anxiety    Restart lexapro has been on in the past Start 5mg  daily      Relevant Medications   escitalopram (LEXAPRO) 5 MG tablet   Essential hypertension, benign    Uncontrolled but  with ARF difficult to find meds to keep BP uncontrol HR at home has been 70-80's, Will increase cardizem to 120mg  in AM and continue 60mg  in evening, she is unable to cut pill in half Continue hydralazine TID at current dose Check renal function and potassium  Has f/u with cardiology already Hold Lasix, swelling has resolved       Relevant Medications   diltiazem (CARDIZEM) 60 MG tablet   Other Relevant Orders   Basic metabolic panel   Peripheral edema    Other Visit Diagnoses    Impacted cerumen of right ear    -  Primary   s/p irrigation   Acute renal failure, unspecified acute renal failure type ([place])       Hyperkalemia          Note: This dictation was prepared with Dragon dictation along with smaller phrase technology. Any transcriptional errors that result from this process are unintentional.

## 2018-09-16 NOTE — Assessment & Plan Note (Signed)
Uncontrolled but with ARF difficult to find meds to keep BP uncontrol HR at home has been 70-80's, Will increase cardizem to 120mg  in AM and continue 60mg  in evening, she is unable to cut pill in half Continue hydralazine TID at current dose Check renal function and potassium  Has f/u with cardiology already Hold Lasix, swelling has resolved

## 2018-09-17 LAB — BASIC METABOLIC PANEL
BUN/Creatinine Ratio: 14 (calc) (ref 6–22)
BUN: 19 mg/dL (ref 7–25)
CO2: 25 mmol/L (ref 20–32)
Calcium: 9.3 mg/dL (ref 8.6–10.4)
Chloride: 101 mmol/L (ref 98–110)
Creat: 1.38 mg/dL — ABNORMAL HIGH (ref 0.50–0.99)
Glucose, Bld: 78 mg/dL (ref 65–99)
Potassium: 4.6 mmol/L (ref 3.5–5.3)
Sodium: 136 mmol/L (ref 135–146)

## 2018-09-23 ENCOUNTER — Other Ambulatory Visit: Payer: Self-pay | Admitting: Family Medicine

## 2018-09-24 ENCOUNTER — Encounter: Payer: Self-pay | Admitting: Family Medicine

## 2018-09-24 ENCOUNTER — Ambulatory Visit (INDEPENDENT_AMBULATORY_CARE_PROVIDER_SITE_OTHER): Payer: Medicare HMO | Admitting: Family Medicine

## 2018-09-24 ENCOUNTER — Other Ambulatory Visit: Payer: Self-pay

## 2018-09-24 VITALS — BP 178/68 | HR 84 | Temp 98.9°F | Resp 16 | Ht 65.0 in | Wt 207.0 lb

## 2018-09-24 DIAGNOSIS — I889 Nonspecific lymphadenitis, unspecified: Secondary | ICD-10-CM | POA: Diagnosis not present

## 2018-09-24 MED ORDER — AMOXICILLIN-POT CLAVULANATE 875-125 MG PO TABS: 1.0000 | ORAL_TABLET | Freq: Two times a day (BID) | ORAL | 0 refills | Status: DC

## 2018-09-24 NOTE — Progress Notes (Signed)
Subjective:    Patient ID: Paula Long, female    DOB: 1950/05/24, 68 y.o.   MRN: [id number]  HPI Patient reports a 5-day history of a painful swelling at the angle of her mandible on the left side.  In that area, there is a swollen area approximately 2 cm in diameter.  It is poorly delineated.  There are no sharp borders.  It is a nodular indurated area.  It is possibly an inflamed lymph node versus the inferior pole of the parotid gland.  However it is swollen and tender to the touch.  Is no lymphadenopathy anywhere else in the neck.  She denies any throat pain, trouble swallowing, otalgia, or sinus pain Past Medical History:  Diagnosis Date  . Anxiety   . Arthritis 12-07-11   arthritis,DDD,spinal stenosis  . Chronic back pain   . COPD (chronic obstructive pulmonary disease) ([place]) 12-07-11   pt. uses nebulizer as needed and inhaler daily  . Fibromyalgia    skin cancer  . GERD (gastroesophageal reflux disease)   . Hemorrhoids   . Hypertension 12-07-11   presently no meds  . Normal cardiac stress test 06/2009  . Shortness of breath 12-07-11   less now, occ wheezes  . Skin cancer    melanoma.  face 2014   Past Surgical History:  Procedure Laterality Date  . ABDOMINAL HYSTERECTOMY    . BACK SURGERY  12-07-11   2'10 L5 x2  . COLONOSCOPY WITH PROPOFOL N/A 06/03/2014   Procedure: COLONOSCOPY WITH PROPOFOL; IN CECUM AT 0750; TOTAL WITHDRAWAL TIME 16 MINUTES;  Surgeon: Rogene Houston, MD;  Location: [facility];  Service: Endoscopy;  Laterality: N/A;  . left ankle surgery     tendonitis  . LUMBAR LAMINECTOMY/DECOMPRESSION MICRODISCECTOMY  12/11/2011   Procedure: LUMBAR LAMINECTOMY/DECOMPRESSION MICRODISCECTOMY;  Surgeon: Tobi Bastos, MD;  Location: [facility];  Service: Orthopedics;  Laterality: Left;  Decompressive Lumbar Laminectomy L5-S1 on Left  . POLYPECTOMY N/A 06/03/2014   Procedure: POLYPECTOMY;  Surgeon: Rogene Houston, MD;  Location: [facility];  Service: Endoscopy;  Laterality: N/A;   . right wrist surgery for pinched nerve    . trigger finger surgery  12-07-11   rt. middle trigger finger release   Current Outpatient Medications on File Prior to Visit  Medication Sig Dispense Refill  . albuterol (PROVENTIL) (2.5 MG/3ML) 0.083% nebulizer solution INHALE THE CONTENTS OF 1 VIAL VIA NEBULIZER EVERY 4 HOURS AS NEEDED FOR WHEEZING OR SHORTNESS OF BREATH 90 mL 1  . albuterol (VENTOLIN HFA) 108 (90 Base) MCG/ACT inhaler INHALE 2 PUFFS EVERY 4 HOURS AS NEEDED FOR WHEEZING OR SHORTNESS OF BREATH 18 g 3  . desmopressin (DDAVP) 0.1 MG tablet TAKE 1 TABLET AT BEDTIME FOR BLADDER 90 tablet 0  . DEXILANT 60 MG capsule TAKE 1 CAPSULE EVERY DAY 90 capsule 3  . diltiazem (CARDIZEM) 60 MG tablet Take 2 tablet in morning and 1 in evening 180 tablet 1  . EASYMAX TEST test strip USE TO TEST ONCE DAILY. 100 each 3  . escitalopram (LEXAPRO) 5 MG tablet Take 1 tablet (5 mg total) by mouth daily. 30 tablet 2  . furosemide (LASIX) 20 MG tablet Take 1 tablet (20 mg total) by mouth daily as needed. 30 tablet 0  . gabapentin (NEURONTIN) 400 MG capsule TAKE 2 CAPSULES AT BEDTIME 180 capsule 3  . hydrALAZINE (APRESOLINE) 25 MG tablet Take 1 tablet (25 mg total) by mouth 3 (three) times daily. 90 tablet 1  . IBU  600 MG tablet TAKE 1 TABLET TWICE DAILY AS NEEDED 180 tablet 1  . nystatin (MYCOSTATIN/NYSTOP) powder APPLY  TOPICALLY TWICE DAILY AS NEEDED 60 g 2  . traZODone (DESYREL) 150 MG tablet TAKE 1 TABLET AT BEDTIME 90 tablet 3   No current facility-administered medications on file prior to visit.    Allergies  Allergen Reactions  . Sulfa Antibiotics   . Nystatin Rash    Oral rash  . Percocet [Oxycodone-Acetaminophen] Rash    Oral rash  . Vesicare [Solifenacin] Rash  . Vicodin [Hydrocodone-Acetaminophen] Rash    Oral rash   Social History   Socioeconomic History  . Marital status: Legally Separated    Spouse name: Not on file  . Number of children: Not on file  . Years of education: Not  on file  . Highest education level: Not on file  Occupational History  . Not on file  Social Needs  . Financial resource strain: Not on file  . Food insecurity    Worry: Not on file    Inability: Not on file  . Transportation needs    Medical: Not on file    Non-medical: Not on file  Tobacco Use  . Smoking status: Current Every Day Smoker    Packs/day: 2.00    Years: 48.00    Pack years: 96.00    Types: Cigarettes    Start date: 01/07/1970  . Smokeless tobacco: Never Used  . Tobacco comment: since age 69. smoking 2-3 packs a day  Substance and Sexual Activity  . Alcohol use: No    Alcohol/week: 0.0 standard drinks  . Drug use: No  . Sexual activity: Yes    Birth control/protection: Surgical  Lifestyle  . Physical activity    Days per week: Not on file    Minutes per session: Not on file  . Stress: Not on file  Relationships  . Social Herbalist on phone: Not on file    Gets together: Not on file    Attends religious service: Not on file    Active member of club or organization: Not on file    Attends meetings of clubs or organizations: Not on file    Relationship status: Not on file  . Intimate partner violence    Fear of current or ex partner: Not on file    Emotionally abused: Not on file    Physically abused: Not on file    Forced sexual activity: Not on file  Other Topics Concern  . Not on file  Social History Narrative  . Not on file     Review of Systems  All other systems reviewed and are negative.      Objective:   Physical Exam Vitals signs reviewed.  Constitutional:      General: She is not in acute distress.    Appearance: Normal appearance. She is not ill-appearing or diaphoretic.  HENT:     Head:     Jaw: No tenderness, swelling, pain on movement or malocclusion.     Salivary Glands: Left salivary gland is tender.      Right Ear: Tympanic membrane and ear canal normal.     Left Ear: Tympanic membrane and ear canal normal.      Nose: Nose normal.     Mouth/Throat:     Mouth: Mucous membranes are moist.     Pharynx: No oropharyngeal exudate or posterior oropharyngeal erythema.  Neck:     Musculoskeletal: Normal range of motion  and neck supple.  Cardiovascular:     Rate and Rhythm: Normal rate and regular rhythm.     Heart sounds: Normal heart sounds. No murmur. No gallop.   Neurological:     Mental Status: She is alert.           Assessment & Plan:  The encounter diagnosis was Lymphadenitis. Diagnosis is uncertain.  I am not sure if this is an inflamed infected lymph node or if the inferior pole of the parotid gland is swollen/infected and painful.  I recommended trying Augmentin 875 mg p.o. twice daily for 10 days.  Recheck later this week.  If the area is growing or worsening, I would recommend a CT scan of the head neck to evaluate the area further to determine if there is a neoplastic growth in that area.

## 2018-09-27 ENCOUNTER — Ambulatory Visit (INDEPENDENT_AMBULATORY_CARE_PROVIDER_SITE_OTHER): Payer: Medicare HMO | Admitting: Cardiology

## 2018-09-27 ENCOUNTER — Other Ambulatory Visit: Payer: Self-pay

## 2018-09-27 ENCOUNTER — Encounter: Payer: Self-pay | Admitting: Cardiology

## 2018-09-27 VITALS — BP 170/70 | HR 94 | Temp 98.9°F | Ht 65.0 in | Wt 207.6 lb

## 2018-09-27 DIAGNOSIS — I10 Essential (primary) hypertension: Secondary | ICD-10-CM | POA: Diagnosis not present

## 2018-09-27 DIAGNOSIS — R002 Palpitations: Secondary | ICD-10-CM

## 2018-09-27 MED ORDER — DILTIAZEM HCL 120 MG PO TABS: 120.0000 mg | ORAL_TABLET | Freq: Two times a day (BID) | ORAL | 3 refills | Status: DC

## 2018-09-27 MED ORDER — HYDRALAZINE HCL 50 MG PO TABS: 50.0000 mg | ORAL_TABLET | Freq: Three times a day (TID) | ORAL | 3 refills | Status: DC

## 2018-09-27 NOTE — Progress Notes (Signed)
Clinical Summary Ms. Halfhill is a 68 y.o.female seen today for follow up of the following medical problems.  1.Palpitations - started a few months ago - fluttering in chest, occurs few times week. Lasts a few seconds.Some SOB at times  - Jan 2020 event monitor no symptoms reported, no arrhythmias - has been started on diltiazem in the past for both palpitatoins and HTN  - ongoing palpitations since last visit, primarily at night.     2. HTN - high bp's recently up to 200s/100s. First thing in AM 193/93 before meds. Later 225/105, then 186/90 and 168/70. HRs 70-80s - seen in ER 08/07/18 with elevated bp's. - recently restarted on norvasc by another provider   - chlrothalidone and lisinopril stopped due to AKI. Cr from 0.97-->1.3-->1.56 on 6/17. K was up to 5.6.  - after stoppring Cr trended down to 1.38, K 4.6   - remains on dilt 11m in AM  And 60mg  in PM, hydral 25mg  tid.    3. LE edema - has prn lasix   Past Medical History:  Diagnosis Date   Anxiety    Arthritis 12-07-11   arthritis,DDD,spinal stenosis   Chronic back pain    COPD (chronic obstructive pulmonary disease) ([place]) 12-07-11   pt. uses nebulizer as needed and inhaler daily   Fibromyalgia    skin cancer   GERD (gastroesophageal reflux disease)    Hemorrhoids    Hypertension 12-07-11   presently no meds   Normal cardiac stress test 06/2009   Shortness of breath 12-07-11   less now, occ wheezes   Skin cancer    melanoma.  face 2014     Allergies  Allergen Reactions   Sulfa Antibiotics    Nystatin Rash    Oral rash   Percocet [Oxycodone-Acetaminophen] Rash    Oral rash   Vesicare [Solifenacin] Rash   Vicodin [Hydrocodone-Acetaminophen] Rash    Oral rash     Current Outpatient Medications  Medication Sig Dispense Refill   albuterol (PROVENTIL) (2.5 MG/3ML) 0.083% nebulizer solution INHALE THE CONTENTS OF 1 VIAL VIA NEBULIZER EVERY 4 HOURS AS NEEDED FOR WHEEZING OR  SHORTNESS OF BREATH 90 mL 1   albuterol (VENTOLIN HFA) 108 (90 Base) MCG/ACT inhaler INHALE 2 PUFFS EVERY 4 HOURS AS NEEDED FOR WHEEZING OR SHORTNESS OF BREATH 18 g 3   amoxicillin-clavulanate (AUGMENTIN) 875-125 MG tablet Take 1 tablet by mouth 2 (two) times daily. 20 tablet 0   desmopressin (DDAVP) 0.1 MG tablet TAKE 1 TABLET AT BEDTIME FOR BLADDER 90 tablet 0   DEXILANT 60 MG capsule TAKE 1 CAPSULE EVERY DAY 90 capsule 3   diltiazem (CARDIZEM) 60 MG tablet Take 2 tablet in morning and 1 in evening 180 tablet 1   EASYMAX TEST test strip USE TO TEST ONCE DAILY. 100 each 3   escitalopram (LEXAPRO) 5 MG tablet Take 1 tablet (5 mg total) by mouth daily. 30 tablet 2   furosemide (LASIX) 20 MG tablet Take 1 tablet (20 mg total) by mouth daily as needed. 30 tablet 0   gabapentin (NEURONTIN) 400 MG capsule TAKE 2 CAPSULES AT BEDTIME 180 capsule 3   hydrALAZINE (APRESOLINE) 25 MG tablet Take 1 tablet (25 mg total) by mouth 3 (three) times daily. 90 tablet 1   IBU 600 MG tablet TAKE 1 TABLET TWICE DAILY AS NEEDED 180 tablet 1   nystatin (MYCOSTATIN/NYSTOP) powder APPLY  TOPICALLY TWICE DAILY AS NEEDED 60 g 2   traZODone (DESYREL) 150 MG tablet  TAKE 1 TABLET AT BEDTIME 90 tablet 3   No current facility-administered medications for this visit.      Past Surgical History:  Procedure Laterality Date   ABDOMINAL HYSTERECTOMY     BACK SURGERY  12-07-11   2'10 L5 x2   COLONOSCOPY WITH PROPOFOL N/A 06/03/2014   Procedure: COLONOSCOPY WITH PROPOFOL; IN CECUM AT 0750; TOTAL WITHDRAWAL TIME 16 MINUTES;  Surgeon: Rogene Houston, MD;  Location: [facility];  Service: Endoscopy;  Laterality: N/A;   left ankle surgery     tendonitis   LUMBAR LAMINECTOMY/DECOMPRESSION MICRODISCECTOMY  12/11/2011   Procedure: LUMBAR LAMINECTOMY/DECOMPRESSION MICRODISCECTOMY;  Surgeon: Tobi Bastos, MD;  Location: [facility];  Service: Orthopedics;  Laterality: Left;  Decompressive Lumbar Laminectomy L5-S1 on Left    POLYPECTOMY N/A 06/03/2014   Procedure: POLYPECTOMY;  Surgeon: Rogene Houston, MD;  Location: [facility];  Service: Endoscopy;  Laterality: N/A;   right wrist surgery for pinched nerve     trigger finger surgery  12-07-11   rt. middle trigger finger release     Allergies  Allergen Reactions   Sulfa Antibiotics    Nystatin Rash    Oral rash   Percocet [Oxycodone-Acetaminophen] Rash    Oral rash   Vesicare [Solifenacin] Rash   Vicodin [Hydrocodone-Acetaminophen] Rash    Oral rash      Family History  Problem Relation Age of Onset   Cancer Mother        uterine   Cancer Father        lung     Social History Ms. Symonette reports that she has been smoking cigarettes. She started smoking about 48 years ago. She has a 96.00 pack-year smoking history. She has never used smokeless tobacco. Ms. Englert reports no history of alcohol use.   Review of Systems CONSTITUTIONAL: No weight loss, fever, chills, weakness or fatigue.  HEENT: Eyes: No visual loss, blurred vision, double vision or yellow sclerae.No hearing loss, sneezing, congestion, runny nose or sore throat.  SKIN: No rash or itching.  CARDIOVASCULAR: per hpi RESPIRATORY: No shortness of breath, cough or sputum.  GASTROINTESTINAL: No anorexia, nausea, vomiting or diarrhea. No abdominal pain or blood.  GENITOURINARY: No burning on urination, no polyuria NEUROLOGICAL: No headache, dizziness, syncope, paralysis, ataxia, numbness or tingling in the extremities. No change in bowel or bladder control.  MUSCULOSKELETAL: No muscle, back pain, joint pain or stiffness.  LYMPHATICS: No enlarged nodes. No history of splenectomy.  PSYCHIATRIC: No history of depression or anxiety.  ENDOCRINOLOGIC: No reports of sweating, cold or heat intolerance. No polyuria or polydipsia.  Marland Kitchen   Physical Examination Today's Vitals   09/27/18 1342  BP: (!) 170/70  Pulse: 94  Temp: 98.9 F (37.2 C)  SpO2: 98%  Weight: 207 lb 9.6 oz (94.2 kg)    Height: 5\' 5"  (1.651 m)   Body mass index is 34.55 kg/m.  Gen: resting comfortably, no acute distress HEENT: no scleral icterus, pupils equal round and reactive, no palptable cervical adenopathy,  CV: RRR, no m/r/g, no jvd Resp: Clear to auscultation bilaterally GI: abdomen is soft, non-tender, non-distended, normal bowel sounds, no hepatosplenomegaly MSK: extremities are warm, no edema.  Skin: warm, no rash Neuro:  no focal deficits Psych: appropriate affect   Diagnostic Studies 08/2017 echo Study Conclusions  - Left ventricle: The cavity size was normal. Wall thickness was normal. Systolic function was normal. The estimated ejection fraction was in the range of 55% to 60%. Wall motion was normal; there were  no regional wall motion abnormalities. Left ventricular diastolic function parameters were normal. - Aortic valve: Mildly calcified annulus. Mildly thickened leaflets. Valve area (VTI): 2.58 cm^2. Valve area (Vmax): 2.54 cm^2. - Mitral valve: Mildly calcified annulus. Normal thickness leaflets . - Left atrium: The atrium was mildly dilated. - Technically adequate study.  Jan 2020 event monitor  14 day event monitor  Min HR 46, Max HR 123, Avg HR 73. Min HR occurred in very early AM hours presumably while sleeping  No symptoms reported  Telemetry tracings show sinus rhythm  No significant arrhythmias    Assessment and Plan  1. Palpitations -ongoing symptoms, primarily at night. Increase dilt to 120mg  bid, if controls would conslidate and change to long acting dilt over time.    2.HTN - AKI and hyperkalemia on chlorthalidone and lisinopril, both stopped - bp remains elevated. Increase dilt as reported above and increase hydral to 50mg  tid - update [place] on bp's in 1 week    F/u 4 months   Arnoldo Lenis, M.D.

## 2018-09-27 NOTE — Patient Instructions (Signed)
Medication Instructions:   Increase Hydralazine to 50mg  three times per day.  Increase Diltiazem to 120mg  twice a day.  Continue all other medications.    Labwork: none  Testing/Procedures: none  Follow-Up: 4 months   Any Other Special Instructions Will Be Listed Below (If Applicable). Please call the office with an update on your blood pressure and heart rate readings in one 1 week.    If you need a refill on your cardiac medications before your next appointment, please call your pharmacy.

## 2018-09-29 ENCOUNTER — Observation Stay (HOSPITAL_COMMUNITY)
Admission: EM | Admit: 2018-09-29 | Discharge: 2018-10-01 | Disposition: A | Discharge status: Home / Self Care | Payer: Medicare HMO | Attending: Internal Medicine | Admitting: Internal Medicine

## 2018-09-29 ENCOUNTER — Encounter (HOSPITAL_COMMUNITY): Payer: Self-pay | Admitting: *Deleted

## 2018-09-29 ENCOUNTER — Emergency Department (HOSPITAL_COMMUNITY): Payer: Medicare HMO

## 2018-09-29 ENCOUNTER — Other Ambulatory Visit: Payer: Self-pay

## 2018-09-29 ENCOUNTER — Observation Stay (HOSPITAL_BASED_OUTPATIENT_CLINIC_OR_DEPARTMENT_OTHER): Payer: Medicare HMO

## 2018-09-29 DIAGNOSIS — G459 Transient cerebral ischemic attack, unspecified: Secondary | ICD-10-CM

## 2018-09-29 DIAGNOSIS — J449 Chronic obstructive pulmonary disease, unspecified: Secondary | ICD-10-CM | POA: Diagnosis not present

## 2018-09-29 DIAGNOSIS — E1121 Type 2 diabetes mellitus with diabetic nephropathy: Secondary | ICD-10-CM

## 2018-09-29 DIAGNOSIS — R739 Hyperglycemia, unspecified: Secondary | ICD-10-CM | POA: Diagnosis not present

## 2018-09-29 DIAGNOSIS — I129 Hypertensive chronic kidney disease with stage 1 through stage 4 chronic kidney disease, or unspecified chronic kidney disease: Secondary | ICD-10-CM | POA: Insufficient documentation

## 2018-09-29 DIAGNOSIS — H539 Unspecified visual disturbance: Secondary | ICD-10-CM | POA: Diagnosis not present

## 2018-09-29 DIAGNOSIS — Z79899 Other long term (current) drug therapy: Secondary | ICD-10-CM | POA: Insufficient documentation

## 2018-09-29 DIAGNOSIS — Z03818 Encounter for observation for suspected exposure to other biological agents ruled out: Secondary | ICD-10-CM | POA: Insufficient documentation

## 2018-09-29 DIAGNOSIS — I10 Essential (primary) hypertension: Secondary | ICD-10-CM

## 2018-09-29 DIAGNOSIS — F329 Major depressive disorder, single episode, unspecified: Secondary | ICD-10-CM

## 2018-09-29 DIAGNOSIS — G458 Other transient cerebral ischemic attacks and related syndromes: Secondary | ICD-10-CM | POA: Diagnosis not present

## 2018-09-29 DIAGNOSIS — R531 Weakness: Secondary | ICD-10-CM | POA: Diagnosis not present

## 2018-09-29 DIAGNOSIS — I6523 Occlusion and stenosis of bilateral carotid arteries: Secondary | ICD-10-CM | POA: Diagnosis not present

## 2018-09-29 DIAGNOSIS — M542 Cervicalgia: Secondary | ICD-10-CM | POA: Diagnosis not present

## 2018-09-29 DIAGNOSIS — F17210 Nicotine dependence, cigarettes, uncomplicated: Secondary | ICD-10-CM | POA: Diagnosis not present

## 2018-09-29 DIAGNOSIS — E785 Hyperlipidemia, unspecified: Secondary | ICD-10-CM | POA: Diagnosis not present

## 2018-09-29 DIAGNOSIS — N183 Chronic kidney disease, stage 3 unspecified: Secondary | ICD-10-CM

## 2018-09-29 DIAGNOSIS — Z20828 Contact with and (suspected) exposure to other viral communicable diseases: Secondary | ICD-10-CM | POA: Diagnosis not present

## 2018-09-29 DIAGNOSIS — K219 Gastro-esophageal reflux disease without esophagitis: Secondary | ICD-10-CM

## 2018-09-29 DIAGNOSIS — E1122 Type 2 diabetes mellitus with diabetic chronic kidney disease: Secondary | ICD-10-CM

## 2018-09-29 DIAGNOSIS — Z85828 Personal history of other malignant neoplasm of skin: Secondary | ICD-10-CM | POA: Insufficient documentation

## 2018-09-29 LAB — I-STAT CHEM 8, ED
BUN: 24 mg/dL — ABNORMAL HIGH (ref 8–23)
Calcium, Ion: 1.15 mmol/L (ref 1.15–1.40)
Chloride: 95 mmol/L — ABNORMAL LOW (ref 98–111)
Creatinine, Ser: 1.7 mg/dL — ABNORMAL HIGH (ref 0.44–1.00)
Glucose, Bld: 122 mg/dL — ABNORMAL HIGH (ref 70–99)
HCT: 45 % (ref 36.0–46.0)
Hemoglobin: 15.3 g/dL — ABNORMAL HIGH (ref 12.0–15.0)
Potassium: 4.2 mmol/L (ref 3.5–5.1)
Sodium: 129 mmol/L — ABNORMAL LOW (ref 135–145)
TCO2: 25 mmol/L (ref 22–32)

## 2018-09-29 LAB — RAPID URINE DRUG SCREEN, HOSP PERFORMED
Amphetamines: NOT DETECTED
Barbiturates: NOT DETECTED
Benzodiazepines: NOT DETECTED
Cocaine: NOT DETECTED
Opiates: NOT DETECTED
Tetrahydrocannabinol: NOT DETECTED

## 2018-09-29 LAB — DIFFERENTIAL
Abs Immature Granulocytes: 0.04 10*3/uL (ref 0.00–0.07)
Basophils Absolute: 0.1 10*3/uL (ref 0.0–0.1)
Basophils Relative: 1 %
Eosinophils Absolute: 0.1 10*3/uL (ref 0.0–0.5)
Eosinophils Relative: 1 %
Immature Granulocytes: 1 %
Lymphocytes Relative: 10 %
Lymphs Abs: 0.9 10*3/uL (ref 0.7–4.0)
Monocytes Absolute: 0.6 10*3/uL (ref 0.1–1.0)
Monocytes Relative: 7 %
Neutro Abs: 7.2 10*3/uL (ref 1.7–7.7)
Neutrophils Relative %: 80 %

## 2018-09-29 LAB — COMPREHENSIVE METABOLIC PANEL
ALT: 14 U/L (ref 0–44)
AST: 15 U/L (ref 15–41)
Albumin: 4.1 g/dL (ref 3.5–5.0)
Alkaline Phosphatase: 73 U/L (ref 38–126)
Anion gap: 10 (ref 5–15)
BUN: 25 mg/dL — ABNORMAL HIGH (ref 8–23)
CO2: 24 mmol/L (ref 22–32)
Calcium: 8.9 mg/dL (ref 8.9–10.3)
Chloride: 96 mmol/L — ABNORMAL LOW (ref 98–111)
Creatinine, Ser: 1.63 mg/dL — ABNORMAL HIGH (ref 0.44–1.00)
GFR calc Af Amer: 38 mL/min — ABNORMAL LOW (ref >60)
GFR calc non Af Amer: 32 mL/min — ABNORMAL LOW (ref >60)
Glucose, Bld: 127 mg/dL — ABNORMAL HIGH (ref 70–99)
Potassium: 4.2 mmol/L (ref 3.5–5.1)
Sodium: 130 mmol/L — ABNORMAL LOW (ref 135–145)
Total Bilirubin: 0.5 mg/dL (ref 0.3–1.2)
Total Protein: 7.5 g/dL (ref 6.5–8.1)

## 2018-09-29 LAB — SARS CORONAVIRUS 2 BY RT PCR (HOSPITAL ORDER, PERFORMED IN ~~LOC~~ HOSPITAL LAB): SARS Coronavirus 2: NEGATIVE

## 2018-09-29 LAB — CBC
HCT: 40.9 % (ref 36.0–46.0)
Hemoglobin: 13.5 g/dL (ref 12.0–15.0)
MCH: 30.4 pg (ref 26.0–34.0)
MCHC: 33 g/dL (ref 30.0–36.0)
MCV: 92.1 fL (ref 80.0–100.0)
Platelets: 243 10*3/uL (ref 150–400)
RBC: 4.44 MIL/uL (ref 3.87–5.11)
RDW: 14.4 % (ref 11.5–15.5)
WBC: 8.9 10*3/uL (ref 4.0–10.5)
nRBC: 0 % (ref 0.0–0.2)

## 2018-09-29 LAB — ECHOCARDIOGRAM COMPLETE
Height: 65 in
Weight: 3312 oz

## 2018-09-29 LAB — URINALYSIS, ROUTINE W REFLEX MICROSCOPIC
Bilirubin Urine: NEGATIVE
Glucose, UA: 150 mg/dL — AB
Hgb urine dipstick: NEGATIVE
Ketones, ur: NEGATIVE mg/dL
Leukocytes,Ua: NEGATIVE
Nitrite: NEGATIVE
Protein, ur: NEGATIVE mg/dL
Specific Gravity, Urine: 1.015 (ref 1.005–1.030)
pH: 5 (ref 5.0–8.0)

## 2018-09-29 LAB — PROTIME-INR
INR: 0.9 (ref 0.8–1.2)
Prothrombin Time: 12.3 seconds (ref 11.4–15.2)

## 2018-09-29 LAB — APTT: aPTT: 27 seconds (ref 24–36)

## 2018-09-29 LAB — ETHANOL: Alcohol, Ethyl (B): <10 mg/dL (ref <10)

## 2018-09-29 LAB — CBG MONITORING, ED: Glucose-Capillary: 121 mg/dL — ABNORMAL HIGH (ref 70–99)

## 2018-09-29 MED ORDER — SODIUM CHLORIDE 0.9 % IV SOLN
INTRAVENOUS | Status: DC
  Administered 2018-09-29 – 2018-09-30 (×2): via INTRAVENOUS

## 2018-09-29 MED ORDER — HYDRALAZINE HCL 25 MG PO TABS
25.0000 mg | ORAL_TABLET | Freq: Three times a day (TID) | ORAL | Status: DC
  Administered 2018-09-29 – 2018-10-01 (×5): 25 mg via ORAL
  Filled 2018-09-29 (×6): qty 1

## 2018-09-29 MED ORDER — ESCITALOPRAM OXALATE 10 MG PO TABS
5.0000 mg | ORAL_TABLET | Freq: Every day | ORAL | Status: DC
  Administered 2018-09-30 – 2018-10-01 (×2): 5 mg via ORAL
  Filled 2018-09-29 (×3): qty 1

## 2018-09-29 MED ORDER — SODIUM CHLORIDE 0.9 % IV BOLUS
1000.0000 mL | Freq: Once | INTRAVENOUS | Status: AC
  Administered 2018-09-29: 1000 mL via INTRAVENOUS

## 2018-09-29 MED ORDER — ACETAMINOPHEN 160 MG/5ML PO SOLN: 650.0000 mg | ORAL | Status: DC | PRN

## 2018-09-29 MED ORDER — PANTOPRAZOLE SODIUM 40 MG PO TBEC
40.0000 mg | DELAYED_RELEASE_TABLET | Freq: Every day | ORAL | Status: DC
  Administered 2018-09-30 – 2018-10-01 (×2): 40 mg via ORAL
  Filled 2018-09-29 (×3): qty 1

## 2018-09-29 MED ORDER — DILTIAZEM HCL 60 MG PO TABS
120.0000 mg | ORAL_TABLET | Freq: Two times a day (BID) | ORAL | Status: DC
  Administered 2018-09-29 – 2018-10-01 (×5): 120 mg via ORAL
  Filled 2018-09-29 (×5): qty 2

## 2018-09-29 MED ORDER — GABAPENTIN 300 MG PO CAPS
300.0000 mg | ORAL_CAPSULE | Freq: Two times a day (BID) | ORAL | Status: DC
  Administered 2018-09-29 – 2018-09-30 (×3): 300 mg via ORAL
  Filled 2018-09-29 (×5): qty 1

## 2018-09-29 MED ORDER — STROKE: EARLY STAGES OF RECOVERY BOOK
Freq: Once | Status: AC
  Administered 2018-09-29: 16:00:00
  Filled 2018-09-29: qty 1

## 2018-09-29 MED ORDER — ACETAMINOPHEN 650 MG RE SUPP: 650.0000 mg | RECTAL | Status: DC | PRN

## 2018-09-29 MED ORDER — ENOXAPARIN SODIUM 40 MG/0.4ML ~~LOC~~ SOLN
40.0000 mg | SUBCUTANEOUS | Status: DC
  Administered 2018-09-29 – 2018-09-30 (×2): 40 mg via SUBCUTANEOUS
  Filled 2018-09-29 (×2): qty 0.4

## 2018-09-29 MED ORDER — ACETAMINOPHEN 325 MG PO TABS: 650.0000 mg | ORAL_TABLET | ORAL | Status: DC | PRN

## 2018-09-29 MED ORDER — DESMOPRESSIN ACETATE 0.1 MG PO TABS: 0.1000 mg | ORAL_TABLET | Freq: Every day | ORAL | Status: DC

## 2018-09-29 MED ORDER — ASPIRIN 325 MG PO TABS
325.0000 mg | ORAL_TABLET | Freq: Every day | ORAL | Status: DC
  Administered 2018-09-30 – 2018-10-01 (×2): 325 mg via ORAL
  Filled 2018-09-29 (×2): qty 1

## 2018-09-29 MED ORDER — ALBUTEROL SULFATE (2.5 MG/3ML) 0.083% IN NEBU: 2.5000 mg | INHALATION_SOLUTION | Freq: Four times a day (QID) | RESPIRATORY_TRACT | Status: DC | PRN

## 2018-09-29 MED ORDER — TRAZODONE HCL 50 MG PO TABS
150.0000 mg | ORAL_TABLET | Freq: Every day | ORAL | Status: DC
  Administered 2018-09-29 – 2018-09-30 (×2): 150 mg via ORAL
  Filled 2018-09-29 (×2): qty 3

## 2018-09-29 MED ORDER — ASPIRIN 325 MG PO TABS
325.0000 mg | ORAL_TABLET | Freq: Once | ORAL | Status: AC
  Administered 2018-09-29: 325 mg via ORAL
  Filled 2018-09-29: qty 1

## 2018-09-29 NOTE — ED Notes (Signed)
Daughter Paula Long notified of admission room.

## 2018-09-29 NOTE — ED Provider Notes (Signed)
[facility] EMERGENCY DEPARTMENT Provider Note   CSN: [id number] Arrival date & time: 09/29/18  1027    History   Chief Complaint Chief Complaint  Patient presents with  . Weakness    HPI Paula Long is a 68 y.o. female with a PMH of COPD, Anxiety, HTN, Fibromyalgia, and chronic back pain presenting with generalized weakness and bilateral blurry vision onset today at 8:30am. Patient reports she was cooking breakfast when symptoms began. Patient reports vision changes are worse in right eye. Patient states she is typically able to see without glasses or contacts. Patient reports associated nausea and diaphoresis. Patient reports numbness over the left aspect of face over the last few days. Patient states she has had antibiotics recently for lymphadenitis. Patient reports chronic left leg weakness after back surgery. Patient reports intermittent headaches. Patient denies vomiting, abdominal pain, diarrhea, chest pain, shortness of breath, fever, or cough. Patient reports her blood pressure medications were increased 3 days ago. Patient denies sick contacts or recent travel. Patient denies a personal or family history of CVA. Patient reports tobacco use, but denies alcohol or drug use.      HPI  Past Medical History:  Diagnosis Date  . Anxiety   . Arthritis 12-07-11   arthritis,DDD,spinal stenosis  . Chronic back pain   . COPD (chronic obstructive pulmonary disease) (HCC) 12-07-11   pt. uses nebulizer as needed and inhaler daily  . Fibromyalgia    skin cancer  . GERD (gastroesophageal reflux disease)   . Hemorrhoids   . Hypertension 12-07-11   presently no meds  . Normal cardiac stress test 06/2009  . Shortness of breath 12-07-11   less now, occ wheezes  . Skin cancer    melanoma.  face 2014    Patient Active Problem List   Diagnosis Date Noted  . Peripheral edema 09/04/2018  . Depression with anxiety 05/22/2014  . Family hx of colon cancer 02/19/2014  . IBS (irritable  bowel syndrome) 01/23/2014  . Urinary incontinence, mixed 10/13/2013  . Chronic insomnia 09/12/2013  . Boil 02/27/2013  . Skin lesion of face 10/22/2012  . Hemorrhoids 10/22/2012  . Intertrigo 06/21/2012  . Neck pain 06/21/2012  . Bunion of great toe 06/21/2012  . OAB (overactive bladder) 03/15/2012  . Spinal stenosis, lumbar region, with neurogenic claudication 12/11/2011  . Essential hypertension, benign 10/30/2011  . Chronic back pain 10/30/2011  . GERD (gastroesophageal reflux disease) 10/30/2011  . COPD (chronic obstructive pulmonary disease) (HCC) 10/30/2011  . Tobacco user 10/30/2011  . Obesity 10/30/2011  . Abnormal mammogram 10/30/2011    Past Surgical History:  Procedure Laterality Date  . ABDOMINAL HYSTERECTOMY    . BACK SURGERY  12-07-11   2'10 L5 x2  . COLONOSCOPY WITH PROPOFOL N/A 06/03/2014   Procedure: COLONOSCOPY WITH PROPOFOL; IN CECUM AT 0750; TOTAL WITHDRAWAL TIME 16 MINUTES;  Surgeon: Malissa Hippo, MD;  Location: [facility];  Service: Endoscopy;  Laterality: N/A;  . left ankle surgery     tendonitis  . LUMBAR LAMINECTOMY/DECOMPRESSION MICRODISCECTOMY  12/11/2011   Procedure: LUMBAR LAMINECTOMY/DECOMPRESSION MICRODISCECTOMY;  Surgeon: Jacki Cones, MD;  Location: [facility];  Service: Orthopedics;  Laterality: Left;  Decompressive Lumbar Laminectomy L5-S1 on Left  . POLYPECTOMY N/A 06/03/2014   Procedure: POLYPECTOMY;  Surgeon: Malissa Hippo, MD;  Location: [facility];  Service: Endoscopy;  Laterality: N/A;  . right wrist surgery for pinched nerve    . trigger finger surgery  12-07-11   rt. middle trigger finger release  OB History    Gravida  3   Para  2   Term  2   Preterm      AB  1   Living        SAB  1   TAB      Ectopic      Multiple      Live Births               Home Medications    Prior to Admission medications   Medication Sig Start Date End Date Taking? Authorizing Provider  albuterol (PROVENTIL) (2.5 MG/3ML) 0.083%  nebulizer solution INHALE THE CONTENTS OF 1 VIAL VIA NEBULIZER EVERY 4 HOURS AS NEEDED FOR WHEEZING OR SHORTNESS OF BREATH 09/18/16  Yes [place], Velna Hatchet, MD  albuterol (VENTOLIN HFA) 108 (90 Base) MCG/ACT inhaler INHALE 2 PUFFS EVERY 4 HOURS AS NEEDED FOR WHEEZING OR SHORTNESS OF BREATH 09/23/18  Yes [place], Velna Hatchet, MD  amoxicillin-clavulanate (AUGMENTIN) 875-125 MG tablet Take 1 tablet by mouth 2 (two) times daily. 09/24/18  Yes Donita Brooks, MD  desmopressin (DDAVP) 0.1 MG tablet TAKE 1 TABLET AT BEDTIME FOR BLADDER 09/05/18  Yes [place], Velna Hatchet, MD  DEXILANT 60 MG capsule TAKE 1 CAPSULE EVERY DAY 07/09/18  Yes [place], Velna Hatchet, MD  diltiazem (CARDIZEM) 120 MG tablet Take 1 tablet (120 mg total) by mouth 2 (two) times daily. 09/27/18  Yes [place], Dorothe Pea, MD  EASYMAX TEST test strip USE TO TEST ONCE DAILY. 06/03/15  Yes Donita Brooks, MD  escitalopram (LEXAPRO) 5 MG tablet Take 1 tablet (5 mg total) by mouth daily. 09/16/18  Yes [place], Velna Hatchet, MD  furosemide (LASIX) 20 MG tablet Take 1 tablet (20 mg total) by mouth daily as needed. 09/04/18  Yes [place], Velna Hatchet, MD  gabapentin (NEURONTIN) 400 MG capsule TAKE 2 CAPSULES AT BEDTIME 03/06/18  Yes [place], Velna Hatchet, MD  hydrALAZINE (APRESOLINE) 50 MG tablet Take 1 tablet (50 mg total) by mouth 3 (three) times daily. 09/27/18  Yes Antoine Poche, MD  IBU 600 MG tablet TAKE 1 TABLET TWICE DAILY AS NEEDED 08/28/18  Yes [place], Velna Hatchet, MD  nystatin (MYCOSTATIN/NYSTOP) powder APPLY  TOPICALLY TWICE DAILY AS NEEDED 07/29/18  Yes [place], Velna Hatchet, MD  traZODone (DESYREL) 150 MG tablet TAKE 1 TABLET AT BEDTIME 08/28/18  Yes [place], Velna Hatchet, MD    Family History Family History  Problem Relation Age of Onset  . Cancer Mother        uterine  . Cancer Father        lung    Social History Social History   Tobacco Use  . Smoking status: Current Every Day Smoker    Packs/day: 2.00    Years: 48.00    Pack years: 96.00    Types:  Cigarettes    Start date: 01/07/1970  . Smokeless tobacco: Never Used  . Tobacco comment: since age 89. smoking 2-3 packs a day  Substance Use Topics  . Alcohol use: No    Alcohol/week: 0.0 standard drinks  . Drug use: No     Allergies   Sulfa antibiotics, Nystatin, Percocet [oxycodone-acetaminophen], Vesicare [solifenacin], and Vicodin [hydrocodone-acetaminophen]   Review of Systems Review of Systems  Constitutional: Negative for activity change, appetite change, chills, diaphoresis, fatigue, fever and unexpected weight change.  HENT: Negative for congestion, rhinorrhea and sore throat.   Eyes: Positive for visual disturbance. Negative for photophobia, pain and redness.  Respiratory: Negative for cough, chest  tightness and shortness of breath.   Cardiovascular: Negative for chest pain, palpitations and leg swelling.  Gastrointestinal: Negative for abdominal pain, blood in stool, diarrhea, nausea and vomiting.  Endocrine: Negative for cold intolerance and heat intolerance.  Genitourinary: Negative for dysuria.  Musculoskeletal: Negative for myalgias and neck pain.  Skin: Negative for wound.  Allergic/Immunologic: Negative for immunocompromised state.  Neurological: Positive for weakness, numbness and headaches. Negative for dizziness, tremors, seizures, syncope, facial asymmetry, speech difficulty and light-headedness.  Psychiatric/Behavioral: Negative for confusion, dysphoric mood and sleep disturbance. The patient is not nervous/anxious.      Physical Exam Updated Vital Signs BP (!) 184/89 (BP Location: Left Arm)   Pulse 74   Temp 97.6 F (36.4 C) (Oral)   Resp 20   Ht 5\' 5"  (1.651 m)   Wt 93.9 kg   SpO2 96%   BMI 34.45 kg/m   Physical Exam Vitals signs and nursing note reviewed.  Constitutional:      General: She is not in acute distress.    Appearance: She is well-developed. She is not diaphoretic.  HENT:     Head: Normocephalic and atraumatic.     Nose: Nose  normal. No congestion or rhinorrhea.  Neck:     Musculoskeletal: Normal range of motion.  Cardiovascular:     Rate and Rhythm: Normal rate and regular rhythm.     Heart sounds: Normal heart sounds. No murmur. No friction rub. No gallop.   Pulmonary:     Effort: Pulmonary effort is normal. No respiratory distress.     Breath sounds: Normal breath sounds. No wheezing or rales.  Abdominal:     Palpations: Abdomen is soft.     Tenderness: There is no abdominal tenderness.  Musculoskeletal: Normal range of motion.  Skin:    General: Skin is warm.     Findings: No erythema or rash.  Neurological:     Mental Status: She is alert.    Mental Status:  Alert, oriented, thought content appropriate, able to give a coherent history. Speech fluent without evidence of aphasia. Able to follow 2 step commands without difficulty.  Cranial Nerves:  II:  Peripheral visual fields abnormal. Patient is not able to perform with right eye. Peripheral vision is normal in left eye. Pupils equal, round, reactive to light III,IV, VI: ptosis not present, extra-ocular motions intact bilaterally  V,VII: smile symmetric, facial light touch sensation equal VIII: hearing grossly normal to voice  IX,X: symmetric elevation of soft palate, uvula elevates symmetrically  XI: bilateral shoulder shrug symmetric and strong XII: midline tongue extension without fassiculations Motor:  Normal tone. 5/5 in upper extremities bilaterally including strong and equal grip strength.  5/5 strength in right lower extremity and 4/5 strength in left lower extremity dorsiflexion/plantar flexion  Sensory:  light touch normal in all extremities. Light touch abnormal in left aspect of face.  Deep Tendon Reflexes: 2+ and symmetric in the biceps and patella Cerebellar: abnormal finger-to-nose with bilateral upper extremities.  Negative pronator drift. Negative Romberg sign. CV: distal pulses palpable throughout    ED Treatments / Results    Labs (all labs ordered are listed, but only abnormal results are displayed) Labs Reviewed  COMPREHENSIVE METABOLIC PANEL - Abnormal; Notable for the following components:      Result Value   Sodium 130 (*)    Chloride 96 (*)    Glucose, Bld 127 (*)    BUN 25 (*)    Creatinine, Ser 1.63 (*)    GFR calc  non Af Amer 32 (*)    GFR calc Af Amer 38 (*)    All other components within normal limits  I-STAT CHEM 8, ED - Abnormal; Notable for the following components:   Sodium 129 (*)    Chloride 95 (*)    BUN 24 (*)    Creatinine, Ser 1.70 (*)    Glucose, Bld 122 (*)    Hemoglobin 15.3 (*)    All other components within normal limits  CBG MONITORING, ED - Abnormal; Notable for the following components:   Glucose-Capillary 121 (*)    All other components within normal limits  SARS CORONAVIRUS 2 (HOSPITAL ORDER, PERFORMED IN [facility])  ETHANOL  PROTIME-INR  APTT  CBC  DIFFERENTIAL  RAPID URINE DRUG SCREEN, HOSP PERFORMED  URINALYSIS, ROUTINE W REFLEX MICROSCOPIC    EKG EKG Interpretation  Date/Time:  Sunday September 29 2018 10:57:01 EDT Ventricular Rate:  62 PR Interval:    QRS Duration: 94 QT Interval:  422 QTC Calculation: 429 R Axis:   49 Text Interpretation:  Sinus or ectopic atrial rhythm Short PR interval Low voltage, precordial leads Baseline wander in lead(s) V6 No STEMI  Confirmed by Alona Bene [phone]) on 09/29/2018 11:12:15 AM   Radiology Ct Head Code Stroke Wo Contrast  Result Date: 09/29/2018 CLINICAL DATA:  Code stroke. Dizziness and generalized weakness since 8 a.m. today. EXAM: CT HEAD WITHOUT CONTRAST TECHNIQUE: Contiguous axial images were obtained from the base of the skull through the vertex without intravenous contrast. COMPARISON:  Report of CT head 01/14/2001. Images are not available. FINDINGS: Brain: Mild generalized atrophy and moderate diffuse subcortical white matter disease is present bilaterally. Basal ganglia are intact. Age  indeterminate lacunar infarct of the left thalamus is likely remote. No focal cortical abnormalities are present. The ventricles are of normal size. The brainstem and cerebellum are within normal limits. Vascular: Atherosclerotic calcifications are present within the cavernous internal carotid arteries bilaterally. There is no asymmetric hyperdense vessel. Skull: Calvarium is intact. No focal lytic or blastic lesions are present. Sinuses/Orbits: The paranasal sinuses and mastoid air cells are clear. The globes and orbits are within normal limits. ASPECTS [facility] Stroke Program Early CT Score) - Ganglionic level infarction (caudate, lentiform nuclei, internal capsule, insula, M1-M3 cortex): 7/7 - Supraganglionic infarction (M4-M6 cortex): 3/3 Total score (0-10 with 10 being normal): 10/10 IMPRESSION: 1. Mild generalized atrophy and moderate diffuse white matter disease without acute cortical infarct. 2. 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 Given the other white matter disease. This is likely remote. 3. Atherosclerosis.  No hyperdense vessel. 4. ASPECTS is 10/10 These results were called by telephone at the time of interpretation on 09/29/2018 at 11:47 am to Dr. Alona Bene , who verbally acknowledged these results. Electronically Signed   By: Marin Roberts M.D.   On: 09/29/2018 11:47    Procedures Procedures (including critical care time)  Medications Ordered in ED Medications  aspirin tablet 325 mg (325 mg Oral Given 09/29/18 1217)  sodium chloride 0.9 % bolus 1,000 mL (1,000 mLs Intravenous New Bag/Given 09/29/18 1215)     Initial Impression / Assessment and Plan / ED Course  I have reviewed the triage vital signs and the nursing notes.  Pertinent labs & imaging results that were available during my care of the patient were reviewed by me and considered in my medical decision making (see chart for details).  Clinical Course as of Sep 29 1227  Sun Sep 29, 2018  1154 1. Mild generalized atrophy and moderate diffuse white matter disease without acute cortical infarct. 2. 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 Given the other white matter disease. This is likely remote. 3. Atherosclerosis. No hyperdense vessel. 4. ASPECTS is 10/10    CT HEAD CODE STROKE WO CONTRAST [AH]  1211 Hyponatremia noted at 130. Chloride low at 96. Creatinine elevated at 1.63. Hyperglycemia noted at 127.   Sodium(!): 130 [AH]    Clinical Course User Index [AH] Leretha Dykes, PA-C      Patient presents with weakness, numbness, and vision changes. Patient has an abnormal neurological exam. Code stroke was activated. Teleneurology evaluated the patient. Teleneurology states patient does not require tPA at this time. CT reveals no acute cortical infarct, but an age indeterminate nonhemorrhagic lacunar infarct of the left thalamus is present. Dr. Lyndee Leo recommends providing ASA in the ER and admitting patient for further evaluation. Teleneurology states symptoms may be due to medication side effect or infection, but an acute ischemic stroke cannot be ruled out at this time.  Ordered MRI as requested. Orthostatic vitals are negative. WBCs are within normal limits and patient is afebrile. Consulted hospitalist for admission. Dr. Gwenlyn Perking has agreed to admit patient.  Findings and plan of care discussed with supervising physician Dr. Jacqulyn Bath who personally evaluated and examined this patient.  Final Clinical Impressions(s) / ED Diagnoses   Final diagnoses:  Weakness  Vision changes    ED Discharge Orders    None       Leretha Dykes, [place] 09/29/18 1305    Maia Plan, MD 09/29/18 [phone]

## 2018-09-29 NOTE — Consult Note (Signed)
[facility] Consult Services   Date of Service:   09/29/2018 11:32:22  Impression:     .  Rule Out Acute Ischemic Stroke     .  vs lightheadedness due to systemic condition or medical side effect  Comments/Sign-Out: Patient with history of hypertension, smoking, infected lymph node behind ear Presents with lightheadedness, generalized weakness, nausea, diaphoresis, blurred vision. Head CT: No acute Intracranial abnormality. NIHSS 1 (left face and right arm left leg numbness) Symptoms/presentation is likely to a systemic condition such as infection or medication side effect but cannot rule out Acute Ischemic Stroke just based on clinical presentation. Symptoms too mild and not disabling, the risks of IV tPA would outweigh the potential benefits. Symptoms not suggestive of Large Vessel Occlusion.  Metrics: Last Known Well: 09/29/2018 08:30:00 [facility] Notification Time: 09/29/2018 11:32:22 Arrival Time: 09/29/2018 10:27:00 Stamp Time: 09/29/2018 11:32:22 Time First Login Attempt: 09/29/2018 11:48:12 Video Start Time: 09/29/2018 11:48:12  Symptoms: dizziness NIHSS Start Assessment Time: 09/29/2018 11:51:44 Patient is not a candidate for tPA. Patient was not deemed candidate for tPA thrombolytics because of symptoms too mild and not disabling, the risks of IV tPA would outweigh the potential benefits.. Video End Time: 09/29/2018 11:59:54  CT head showed no acute hemorrhage or acute core infarct. CT head was reviewed.  Clinical Presentation is not Suggestive of Large Vessel Occlusive Disease  ED Physician notified of diagnostic impression and management plan on 09/29/2018 12:00:40  Our recommendations are outlined below.  Recommendations:     .  Activate Stroke Protocol Admission/Order Set     .  Stroke/Telemetry Floor     .  Neuro Checks     .  Bedside Swallow Eval     .  DVT Prophylaxis     .  IV Fluids, Normal Saline     .  Head of Bed  30 Degrees     .  Euglycemia and Avoid Hyperthermia (PRN Acetaminophen)     .  Antiplatelet Therapy Recommended     .  orthostatic vital signs  Routine Consultation with [facility] for Follow up Care  Sign Out:     .  Discussed with Emergency Department Provider    ------------------------------------------------------------------------------  History of Present Illness: Patient is a 68 year old Female.  Patient was brought by private transportation with symptoms of dizziness  Patient with history of hypertension, smoking, infected lymph node behind ear last known well per patient: 8:30 started having nausea and lightheadedness and blurry vision and general weakness and diaphoresis. Blood Pressure 141/47 Blood glucose 121 Denies vertigo, no double vision Anticoagulation or Antiplatelet use: no Premorbid Level of function: Independent, Normal cognition and gait (with limp)   Examination: 1A: Level of Consciousness - Alert; keenly responsive + 0 1B: Ask Month and Age - Both Questions Right + 0 1C: Blink Eyes & Squeeze Hands - Performs Both Tasks + 0 2: Test Horizontal Extraocular Movements - Normal + 0 3: Test Visual Fields - No Visual Loss + 0 4: Test Facial Palsy (Use Grimace if Obtunded) - Normal symmetry + 0 5A: Test Left Arm Motor Drift - No Drift for 10 Seconds + 0 5B: Test Right Arm Motor Drift - No Drift for 10 Seconds + 0 6A: Test Left Leg Motor Drift - No Drift for 5 Seconds + 0 6B: Test Right Leg Motor Drift - No Drift for 5 Seconds + 0 7: Test Limb Ataxia (FNF/Heel-Shin) - No Ataxia + 0 8: Test Sensation - Mild-Moderate Loss:  Less Sharp/More Dull + 1 9: Test Language/Aphasia - Normal; No aphasia + 0 10: Test Dysarthria - Normal + 0 11: Test Extinction/Inattention - No abnormality + 0  NIHSS Score: 1  Patient/Family was informed the Neurology Consult would happen via TeleHealth consult by way of interactive audio and video telecommunications and consented to  receiving care in this manner.  Due to the immediate potential for life-threatening deterioration due to underlying acute neurologic illness, I spent 11 minutes providing critical care. This time includes time for face to face visit via telemedicine, review of medical records, imaging studies and discussion of findings with providers, the patient and/or family.   Dr Elenor Quinones   [facility] [phone]   Case [id number]

## 2018-09-29 NOTE — ED Notes (Signed)
Pt transported to CT ?

## 2018-09-29 NOTE — ED Triage Notes (Signed)
Pt with generalized weakness with blurry vision at 0800 this morning, also with diaphoresis and nausea, denies emesis.  Called ems and refused transport, boyfriend drove pt here now

## 2018-09-29 NOTE — Progress Notes (Signed)
*  PRELIMINARY RESULTS* Echocardiogram 2D Echocardiogram has been performed.  Leavy Cella 09/29/2018, 3:10 PM

## 2018-09-29 NOTE — H&P (Signed)
History and Physical    Paula Long:[id number] DOB: [birth year] DOA: 09/29/2018  Referring MD/NP/PA: Dr. Laverta Baltimore PCP: Alycia Rossetti, MD  Patient coming from: home  Chief Complaint: Generalized weakness and blurred vision.  HPI: Paula Long is a 68 y.o. female family history significant for anxiety, hypertension, fibromyalgia, chronic back pain, gastroesophageal reflux disease, tobacco abuse and COPD; who presented to the emergency department secondary to weakness (acute, generalized, eating she expressed left-sided more affected) and bilateral blurry vision (specifying right worse than left).  The symptoms presented suddenly around 8:30 AM in the morning and were associated with sweating and diaphoresis.  Patient expressed that she woke up feeling blue and was preparing breakfast when she first experienced symptoms.  Patient denies chest pain, palpitations, headaches, photophobia, neck rigidity, coughing spells, dysuria, hematuria, melena, hematochezia, sick contacts, fever, chills or any other complaints.  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  Tele-neurology was consulted and recommended no TPA, given mild nondisabling symptoms and the fact that the risk of IV TPA in her case outweigh the potential benefits.  Recommendations were given to an inpatient to the hospital to complete CVA work-up.  Past Medical/Surgical History: Past Medical History:  Diagnosis Date   Anxiety    Arthritis 12-07-11   arthritis,DDD,spinal stenosis   Chronic back pain    COPD (chronic obstructive pulmonary disease) ([place]) 12-07-11   pt. uses nebulizer as needed and inhaler daily    Fibromyalgia    skin cancer   GERD (gastroesophageal reflux disease)    Hemorrhoids    Hypertension 12-07-11   presently no meds   Normal cardiac stress test 06/2009   Shortness of breath 12-07-11   less now, occ wheezes   Skin cancer    melanoma.  face 2014    Past Surgical History:  Procedure Laterality Date   ABDOMINAL HYSTERECTOMY     BACK SURGERY  12-07-11   2'10 L5 x2   COLONOSCOPY WITH PROPOFOL N/A 06/03/2014   Procedure: COLONOSCOPY WITH PROPOFOL; IN CECUM AT 0750; TOTAL WITHDRAWAL TIME 16 MINUTES;  Surgeon: Rogene Houston, MD;  Location: [facility];  Service: Endoscopy;  Laterality: N/A;   left ankle surgery     tendonitis   LUMBAR LAMINECTOMY/DECOMPRESSION MICRODISCECTOMY  12/11/2011   Procedure: LUMBAR LAMINECTOMY/DECOMPRESSION MICRODISCECTOMY;  Surgeon: Tobi Bastos, MD;  Location: [facility];  Service: Orthopedics;  Laterality: Left;  Decompressive Lumbar Laminectomy L5-S1 on Left   POLYPECTOMY N/A 06/03/2014   Procedure: POLYPECTOMY;  Surgeon: Rogene Houston, MD;  Location: [facility];  Service: Endoscopy;  Laterality: N/A;   right wrist surgery for pinched nerve     trigger finger surgery  12-07-11   rt. middle trigger finger release    Social History:  reports that she has been smoking cigarettes. She started smoking about 48 years ago. She has a 96.00 pack-year smoking history. She has never used smokeless tobacco. She reports that she does not drink alcohol or use drugs.  Allergies: Allergies  Allergen Reactions   Sulfa Antibiotics    Nystatin Rash    Oral rash   Percocet [Oxycodone-Acetaminophen] Rash    Oral rash   Vesicare [Solifenacin] Rash   Vicodin [  Hydrocodone-Acetaminophen] Rash    Oral rash    Family History:  Family History  Problem Relation Age of Onset   Cancer Mother        uterine   Cancer Father        lung    Prior to Admission medications   Medication Sig Start Date End Date Taking? Authorizing Provider  albuterol  (PROVENTIL) (2.5 MG/3ML) 0.083% nebulizer solution INHALE THE CONTENTS OF 1 VIAL VIA NEBULIZER EVERY 4 HOURS AS NEEDED FOR WHEEZING OR SHORTNESS OF BREATH 09/18/16  Yes [place], Modena Nunnery, MD  albuterol (VENTOLIN HFA) 108 (90 Base) MCG/ACT inhaler INHALE 2 PUFFS EVERY 4 HOURS AS NEEDED FOR WHEEZING OR SHORTNESS OF BREATH 09/23/18  Yes [place], Modena Nunnery, MD  amoxicillin-clavulanate (AUGMENTIN) 875-125 MG tablet Take 1 tablet by mouth 2 (two) times daily. 09/24/18  Yes Susy Frizzle, MD  desmopressin (DDAVP) 0.1 MG tablet TAKE 1 TABLET AT BEDTIME FOR BLADDER 09/05/18  Yes [place], Modena Nunnery, MD  DEXILANT 60 MG capsule TAKE 1 CAPSULE EVERY DAY 07/09/18  Yes [place], Modena Nunnery, MD  diltiazem (CARDIZEM) 120 MG tablet Take 1 tablet (120 mg total) by mouth 2 (two) times daily. 09/27/18  Yes [place], Alphonse Guild, MD  EASYMAX TEST test strip USE TO TEST ONCE DAILY. 06/03/15  Yes Susy Frizzle, MD  escitalopram (LEXAPRO) 5 MG tablet Take 1 tablet (5 mg total) by mouth daily. 09/16/18  Yes [place], Modena Nunnery, MD  furosemide (LASIX) 20 MG tablet Take 1 tablet (20 mg total) by mouth daily as needed. 09/04/18  Yes [place], Modena Nunnery, MD  gabapentin (NEURONTIN) 400 MG capsule TAKE 2 CAPSULES AT BEDTIME 03/06/18  Yes [place], Modena Nunnery, MD  hydrALAZINE (APRESOLINE) 50 MG tablet Take 1 tablet (50 mg total) by mouth 3 (three) times daily. 09/27/18  Yes Arnoldo Lenis, MD  IBU 600 MG tablet TAKE 1 TABLET TWICE DAILY AS NEEDED 08/28/18  Yes [place], Modena Nunnery, MD  nystatin (MYCOSTATIN/NYSTOP) powder APPLY  TOPICALLY TWICE DAILY AS NEEDED 07/29/18  Yes [place], Modena Nunnery, MD  traZODone (DESYREL) 150 MG tablet TAKE 1 TABLET AT BEDTIME 08/28/18  Yes , Modena Nunnery, MD    Review of Systems:  Negative except as otherwise mentioned in HPI.   Physical Exam: Vitals:   09/29/18 1400 09/29/18 1430 09/29/18 1511 09/29/18 1711  BP: (!) 169/58 (!) 161/62 (!) 177/62 (!) 154/52  Pulse: 70 65 62 (!) 55  Resp: 18 16 15 17   Temp:  97.8 F  (36.6 C) 98 F (36.7 C) 97.9 F (36.6 C)  TempSrc:  Oral Oral Oral  SpO2: 95% 95% 98% 95%  Weight:   94.6 kg   Height:   5\' 5"  (1.651 m)     Constitutional: NAD, calm, comfortable, afebrile, denying chest pain; expressing some improvement in her weakness.  No pronator drift. Eyes: PERRL, lids and conjunctivae normal; no icterus, no nystagmus. ENMT: Mucous membranes are moist. Posterior pharynx clear of any exudate or lesions.Normal dentition.  Neck: normal, supple, no masses, no thyromegaly Respiratory: clear to auscultation bilaterally, no wheezing, no crackles. Normal respiratory effort. No accessory muscle use.  Cardiovascular: Regular rate and rhythm, no murmurs / rubs / gallops.  Trace lower extremity edema appreciated bilaterally. 2+ pedal pulses. No carotid bruits.  Abdomen: no tenderness, no masses palpated. No hepatosplenomegaly. Bowel sounds positive.  Musculoskeletal: no clubbing / cyanosis. No joint deformity upper and lower extremities. Good ROM, no contractures. Normal muscle tone.  Skin: no rashes, lesions,  ulcers.  Left lower mandible aspect with tender induration (difficult to discern between Parotid gland enlargement and lymph node). Neurologic: CN 2-12 grossly intact. Sensation intact, DTR normal. Strength 4/5 in both upper extremities (secondary to poor effort).  In her lower extremity that decreased muscle strength on the left side that she has expressed before and associated with chronic neuropathy.  Psychiatric: Normal judgment and insight. Alert and oriented x 3. Normal mood.    Labs on Admission: I have personally reviewed the following labs and imaging studies  CBC: Recent Labs  Lab 09/29/18 1116 09/29/18 1143  WBC 8.9  --   NEUTROABS 7.2  --   HGB 13.5 15.3*  HCT 40.9 45.0  MCV 92.1  --   PLT 243  --    Basic Metabolic Panel: Recent Labs  Lab 09/29/18 1116 09/29/18 1143  NA 130* 129*  K 4.2 4.2  CL 96* 95*  CO2 24  --   GLUCOSE 127* 122*  BUN  25* 24*  CREATININE 1.63* 1.70*  CALCIUM 8.9  --    GFR: Estimated Creatinine Clearance: 37 mL/min (A) (by C-G formula based on SCr of 1.7 mg/dL (H)).   Liver Function Tests: Recent Labs  Lab 09/29/18 1116  AST 15  ALT 14  ALKPHOS 73  BILITOT 0.5  PROT 7.5  ALBUMIN 4.1   Coagulation Profile: Recent Labs  Lab 09/29/18 1116  INR 0.9   CBG: Recent Labs  Lab 09/29/18 1139  GLUCAP 121*   Urine analysis:    Component Value Date/Time   COLORURINE YELLOW 09/29/2018 [place] 09/29/2018 1252   LABSPEC 1.015 09/29/2018 1252   PHURINE 5.0 09/29/2018 1252   GLUCOSEU 150 (A) 09/29/2018 1252   HGBUR NEGATIVE 09/29/2018 1252   BILIRUBINUR NEGATIVE 09/29/2018 1252   BILIRUBINUR neg 03/11/2012 1538   KETONESUR NEGATIVE 09/29/2018 1252   PROTEINUR NEGATIVE 09/29/2018 1252   UROBILINOGEN 0.2 10/13/2013 1517   NITRITE NEGATIVE 09/29/2018 1252   LEUKOCYTESUR NEGATIVE 09/29/2018 1252    Recent Results (from the past 240 hour(s))  SARS Coronavirus 2 (CEPHEID - Performed in [place] hospital lab), Hosp Order     Status: None   Collection Time: 09/29/18 12:28 PM   Specimen: Nasopharyngeal Swab  Result Value Ref Range Status   SARS Coronavirus 2 NEGATIVE NEGATIVE Final    Comment: (NOTE) If result is NEGATIVE SARS-CoV-2 target nucleic acids are NOT DETECTED. The SARS-CoV-2 RNA is generally detectable in upper and lower  respiratory specimens during the acute phase of infection. The lowest  concentration of SARS-CoV-2 viral copies this assay can detect is 250  copies / mL. A negative result does not preclude SARS-CoV-2 infection  and should not be used as the sole basis for treatment or other  patient management decisions.  A negative result may occur with  improper specimen collection / handling, submission of specimen other  than nasopharyngeal swab, presence of viral mutation(s) within the  areas targeted by this assay, and inadequate number of viral copies   (<250 copies / mL). A negative result must be combined with clinical  observations, patient history, and epidemiological information. If result is POSITIVE SARS-CoV-2 target nucleic acids are DETECTED. The SARS-CoV-2 RNA is generally detectable in upper and lower  respiratory specimens dur ing the acute phase of infection.  Positive  results are indicative of active infection with SARS-CoV-2.  Clinical  correlation with patient history and other diagnostic information is  necessary to determine patient infection status.  Positive results do  not rule out bacterial infection or co-infection with other viruses. If result is PRESUMPTIVE POSTIVE SARS-CoV-2 nucleic acids MAY BE PRESENT.   A presumptive positive result was obtained on the submitted specimen  and confirmed on repeat testing.  While 2019 novel coronavirus  (SARS-CoV-2) nucleic acids may be present in the submitted sample  additional confirmatory testing may be necessary for epidemiological  and / or clinical management purposes  to differentiate between  SARS-CoV-2 and other Sarbecovirus currently known to infect humans.  If clinically indicated additional testing with an alternate test  methodology [phone]) is advised. The SARS-CoV-2 RNA is generally  detectable in upper and lower respiratory sp ecimens during the acute  phase of infection. The expected result is Negative. Fact Sheet for Patients:  StrictlyIdeas.no Fact Sheet for Healthcare Providers: BankingDealers.co.za This test is not yet approved or cleared by the [place] FDA and has been authorized for detection and/or diagnosis of SARS-CoV-2 by FDA under an Emergency Use Authorization (EUA).  This EUA will remain in effect (meaning this test can be used) for the duration of the COVID-19 declaration under Section 564(b)(1) of the Act, 21 U.S.C. section 360bbb-3(b)(1), unless the authorization is terminated  or revoked sooner. Performed at [facility], [address], [place], [place] [address]      Radiological Exams on Admission: Ct Head Code Stroke Wo Contrast  Result Date: 09/29/2018 CLINICAL DATA:  Code stroke. Dizziness and generalized weakness since 8 a.m. today. EXAM: CT HEAD WITHOUT CONTRAST TECHNIQUE: Contiguous axial images were obtained from the base of the skull through the vertex without intravenous contrast. COMPARISON:  Report of CT head 01/14/2001. Images are not available. FINDINGS: Brain: Mild generalized atrophy and moderate diffuse subcortical white matter disease is present bilaterally. Basal ganglia are intact. Age indeterminate lacunar infarct of the left thalamus is likely remote. No focal cortical abnormalities are present. The ventricles are of normal size. The brainstem and cerebellum are within normal limits. Vascular: Atherosclerotic calcifications are present within the cavernous internal carotid arteries bilaterally. There is no asymmetric hyperdense vessel. Skull: Calvarium is intact. No focal lytic or blastic lesions are present. Sinuses/Orbits: The paranasal sinuses and mastoid air cells are clear. The globes and orbits are within normal limits. ASPECTS [facility] Stroke Program Early CT Score) - Ganglionic level infarction (caudate, lentiform nuclei, internal capsule, insula, M1-M3 cortex): 7/7 - Supraganglionic infarction (M4-M6 cortex): 3/3 Total score (0-10 with 10 being normal): 10/10 IMPRESSION: 1. Mild generalized atrophy and moderate diffuse white matter disease without acute cortical infarct. 2. 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 Given the other white matter disease. This is likely remote. 3. Atherosclerosis.  No hyperdense vessel. 4. ASPECTS is 10/10 These results were called by telephone at the time of interpretation on 09/29/2018 at 11:47 am to Dr. Nanda Quinton , who verbally acknowledged these results. Electronically Signed   By:  San Morelle M.D.   On: 09/29/2018 11:47    EKG: Independently reviewed.   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 -Prior to admission patient was not using aspirin or any other antiplatelet agent. -Following neurology recommendations patient will be admitted to complete CVA work-up -MRI, carotid Dopplers and 2D echo has been ordered -No acute ischemic changes appreciated on EKG -Patient will be monitored on telemetry -Physical therapy, Occupational Therapy and speech therapy evaluation will be ordered. -Patient has passed swallow evaluation at bedside  and will be started on heart healthy diet. -Neurology service (Dr. [name]) has been consulted. -checking TSH, A1C, B12 and lipid panel  2-essential hypertension -Resume home medications (Cardizem and hydralazine) with dose adjustment to allow permissive hypertension while evaluating for acute ischemia. -heart healthy diet ordered. -We will hold Lasix for now.  3-hyperglycemia -no prior to admission hypoglycemic meds. -Check A1c -repeat CBG in am  4-chronic renal failure -Stage III at baseline associated with hypertension -Creatinine appears to be close to baseline -Gentle fluid resuscitation overnight will be provided -Minimize nephrotoxic agents -Follow renal function.  5-GERD -continue PPI.  6-depression  -continue lexapro  7-chronic bck pain and neuropathy -continue PRN pain meds and Neurontin   8-insomnia -continue trazodone  9-left lymphadenitis/parotid enlargement -patient has completed outpatient antibiotics -will check [place] to assist with differential diagnosis and guide further evaluation.  10-COPD/tobacco abuse -no SOB and no wheezing -cessation counseling provided. -nicotine ptch declined -continue PRN nebulizer tx.    DVT prophylaxis: lovenox Code  Status: Full Family Communication: no family at bedside  Disposition Plan: anticipate discharge back home once CVA work up completed. Consults called: neurology  Admission status: observation, LOS < 2 midnights, telemetry bed.   Time Spent: 70 minutes.  Barton Dubois MD [facility] Pager [phone]    09/29/2018, 7:09 PM

## 2018-09-30 ENCOUNTER — Observation Stay (HOSPITAL_COMMUNITY): Payer: Medicare HMO

## 2018-09-30 DIAGNOSIS — H538 Other visual disturbances: Secondary | ICD-10-CM | POA: Diagnosis not present

## 2018-09-30 DIAGNOSIS — E1122 Type 2 diabetes mellitus with diabetic chronic kidney disease: Secondary | ICD-10-CM

## 2018-09-30 DIAGNOSIS — N183 Chronic kidney disease, stage 3 (moderate): Secondary | ICD-10-CM | POA: Diagnosis not present

## 2018-09-30 DIAGNOSIS — I6523 Occlusion and stenosis of bilateral carotid arteries: Secondary | ICD-10-CM | POA: Diagnosis not present

## 2018-09-30 DIAGNOSIS — R269 Unspecified abnormalities of gait and mobility: Secondary | ICD-10-CM | POA: Diagnosis not present

## 2018-09-30 DIAGNOSIS — R221 Localized swelling, mass and lump, neck: Secondary | ICD-10-CM | POA: Diagnosis not present

## 2018-09-30 DIAGNOSIS — R531 Weakness: Secondary | ICD-10-CM | POA: Diagnosis not present

## 2018-09-30 DIAGNOSIS — H539 Unspecified visual disturbance: Secondary | ICD-10-CM | POA: Diagnosis not present

## 2018-09-30 DIAGNOSIS — R739 Hyperglycemia, unspecified: Secondary | ICD-10-CM | POA: Diagnosis not present

## 2018-09-30 DIAGNOSIS — G459 Transient cerebral ischemic attack, unspecified: Secondary | ICD-10-CM | POA: Diagnosis not present

## 2018-09-30 DIAGNOSIS — I10 Essential (primary) hypertension: Secondary | ICD-10-CM | POA: Diagnosis not present

## 2018-09-30 DIAGNOSIS — M542 Cervicalgia: Secondary | ICD-10-CM | POA: Diagnosis not present

## 2018-09-30 DIAGNOSIS — E1121 Type 2 diabetes mellitus with diabetic nephropathy: Secondary | ICD-10-CM | POA: Diagnosis not present

## 2018-09-30 DIAGNOSIS — I69952 Hemiplegia and hemiparesis following unspecified cerebrovascular disease affecting left dominant side: Secondary | ICD-10-CM | POA: Diagnosis not present

## 2018-09-30 LAB — LIPID PANEL
Cholesterol: 110 mg/dL (ref 0–200)
HDL: 49 mg/dL (ref >40)
LDL Cholesterol: 54 mg/dL (ref 0–99)
Total CHOL/HDL Ratio: 2.2 RATIO
Triglycerides: 37 mg/dL (ref <150)
VLDL: 7 mg/dL (ref 0–40)

## 2018-09-30 LAB — HEMOGLOBIN A1C
Hgb A1c MFr Bld: 6.1 % — ABNORMAL HIGH (ref 4.8–5.6)
Mean Plasma Glucose: 128.37 mg/dL

## 2018-09-30 LAB — HIV ANTIBODY (ROUTINE TESTING W REFLEX): HIV Screen 4th Generation wRfx: NONREACTIVE

## 2018-09-30 LAB — VITAMIN B12: Vitamin B-12: 208 pg/mL (ref 180–914)

## 2018-09-30 LAB — TSH: TSH: 1.571 u[IU]/mL (ref 0.350–4.500)

## 2018-09-30 MED ORDER — CYANOCOBALAMIN 1000 MCG/ML IJ SOLN
1000.0000 ug | Freq: Once | INTRAMUSCULAR | Status: DC
  Filled 2018-09-30: qty 1

## 2018-09-30 MED ORDER — SIMVASTATIN 20 MG PO TABS: 20.0000 mg | ORAL_TABLET | Freq: Every day | ORAL | Status: DC

## 2018-09-30 MED ORDER — IOHEXOL 300 MG/ML  SOLN
60.0000 mL | Freq: Once | INTRAMUSCULAR | Status: AC | PRN
  Administered 2018-09-30: 60 mL via INTRAVENOUS

## 2018-09-30 NOTE — Progress Notes (Signed)
PROGRESS NOTE    PAYCEN TECH  VHQ:[id number] DOB: 1951/01/15 DOA: 09/29/2018 PCP: Salley Scarlet, MD     Brief Narrative:  68 y.o. female family history significant for anxiety, hypertension, fibromyalgia, chronic back pain, gastroesophageal reflux disease, tobacco abuse and COPD; who presented to the emergency department secondary to weakness (acute, generalized, eating she expressed left-sided more affected) and bilateral blurry vision (specifying right worse than left).  The symptoms presented suddenly around 8:30 AM in the morning and were associated with sweating and diaphoresis.  Patient expressed that she woke up feeling blue and was preparing breakfast when she first experienced symptoms.  Patient denies chest pain, palpitations, headaches, photophobia, neck rigidity, coughing spells, dysuria, hematuria, melena, hematochezia, sick contacts, fever, chills or any other complaints.  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 -Continue aspirin for secondary prevention -Continue risk factor modification  -LDL 54 and A1c 6.1 -Follow neurology further evaluation and recommendations. -Normal TSH -Low normal B12 level (will initiate oral supplementation). -Home health services recommended by PT.  No dysarthria and cognition back to baseline.  2-essential hypertension -Continue the use of Cardizem and hydralazine -Follow vital signs and continue adjusting antihypertensive regimen as needed.  3-hyperglycemia -Continue modified carbohydrate diet. -No medications prior to admission -A1c 6.1.  4-chronic renal failure: Stage III at baseline -Continue to follow renal function trend. -Continue  holding diuretics and provide fluid resuscitation especially with anticipation of contrast for evaluation on neck lymph nodes abnormalities.  5-GERD -Continue with PI  6-depression/insomnia -No hallucinations -Continue Lexapro and the use of trazodone.  7-chronic back pain and neuropathy -Continue PRN analgesics and the use of Neurontin.  8-left lymphadenitis/parotid enlargement -Ultrasound demonstrating concern for necrotic changes and lymph nodes, disease suggesting most likely malignant neoplasm on the neck -CT with contrast has been recommended and ordered -Follow results.  9-COPD/tobacco abuse -Cessation counseling provided -Continue as needed nebulizer treatment -No shortness of breath or wheezing appreciated currently -Good oxygen saturation on room air.  DVT prophylaxis: Lovenox. Code Status: Full code Family Communication: Daughter updated by phone at bedside. Disposition Plan: Remains inpatient voiding neurology consultation and checking CT neck with contrast to further address abnormalities found on ultrasound.  Consultants:   Neurology  Procedures:   See below for x-ray reports  Carotid duplex: Moderate to large amount of atherosclerotic plaque, left greater than right, results in elevated peak systolic velocities within the bilateral internal carotid arteries, compatible with 50 to 69% luminal narrowing range bilaterally.   2D echo:  1. The left ventricle has hyperdynamic systolic function, with an ejection fraction of >65%. The cavity size was normal. There is mildly increased left ventricular wall thickness. Left ventricular diastolic Doppler parameters are consistent with  impaired relaxation. No evidence of left ventricular regional wall motion abnormalities.  2. The right ventricle has normal systolic function. The cavity was normal. There is no increase in right ventricular wall thickness.  3. Mild thickening of the aortic valve. Mild calcification of the  aortic valve. Mild to moderate aortic annular calcification noted.  4. The mitral valve is grossly normal. There is mild mitral annular calcification present.  5. The aortic root is normal in size and structure.  6. No intracardiac thrombi or masses were  visualized.  Antimicrobials:  Anti-infectives (From admission, onward)   None      Subjective: Oriented x3, no fever, no chest pain, no shortness of breath, no dysarthria, no pronation drift.  Still complaining of left upper neck pain.  Objective: Vitals:   09/30/18 0710 09/30/18 0746 09/30/18 1110 09/30/18 1510  BP: 140/61  (!) 146/52 (!) 160/53  Pulse: 62  70 61  Resp: 18  18 18   Temp: 97.9 F (36.6 C)  97.6 F (36.4 C) 97.6 F (36.4 C)  TempSrc: Oral  Oral Oral  SpO2: 96% 95% 95% 96%  Weight:      Height:        Intake/Output Summary (Last 24 hours) at 09/30/2018 1719 Last data filed at 09/30/2018 1518 Gross per 24 hour  Intake 1341.26 ml  Output 1000 ml  Net 341.26 ml   Filed Weights   09/29/18 1048 09/29/18 1511  Weight: 93.9 kg 94.6 kg    Examination: General exam: Alert, awake, oriented x 3; no pronator drift, reports improvement in her overall vision seal and numbness in upper extremities.  Reports weakness and neuropathy in left lower extremity unchanged from her baseline.  Patient continue complaining of pain in the left mandible/upper neck area.  Patient is afebrile. Respiratory system: Clear to auscultation. Respiratory effort normal. Cardiovascular system:RRR. No murmurs, rubs, gallops. Gastrointestinal system: Abdomen is nondistended, soft and nontender. No organomegaly or masses felt. Normal bowel sounds heard. Central nervous system: Alert and oriented. No focal neurological deficits. Extremities: No cyanosis or clubbing. Skin: No rashes, no petechiae, no open wounds.  Patient with positive left open neck/mandible swollen nodule tender to palpation. Psychiatry: Judgement and insight appear normal. Mood  & affect appropriate.    Data Reviewed: I have personally reviewed following labs and imaging studies  CBC: Recent Labs  Lab 09/29/18 1116 09/29/18 1143  WBC 8.9  --   NEUTROABS 7.2  --   HGB 13.5 15.3*  HCT 40.9 45.0  MCV 92.1  --   PLT 243  --    Basic Metabolic Panel: Recent Labs  Lab 09/29/18 1116 09/29/18 1143  NA 130* 129*  K 4.2 4.2  CL 96* 95*  CO2 24  --   GLUCOSE 127* 122*  BUN 25* 24*  CREATININE 1.63* 1.70*  CALCIUM 8.9  --    GFR: Estimated Creatinine Clearance: 37 mL/min (A) (by C-G formula based on SCr of 1.7 mg/dL (H)).   Liver Function Tests: Recent Labs  Lab 09/29/18 1116  AST 15  ALT 14  ALKPHOS 73  BILITOT 0.5  PROT 7.5  ALBUMIN 4.1   Coagulation Profile: Recent Labs  Lab 09/29/18 1116  INR 0.9   HbA1C: Recent Labs    09/30/18 0613  HGBA1C 6.1*   CBG: Recent Labs  Lab 09/29/18 1139  GLUCAP 121*   Lipid Profile: Recent Labs    09/30/18 0613  CHOL 110  HDL 49  LDLCALC 54  TRIG 37  CHOLHDL 2.2   Thyroid Function Tests: Recent Labs    09/30/18 0613  TSH 1.571   Anemia Panel: Recent Labs    09/30/18 0613  VITAMINB12 208   Urine analysis:    Component Value Date/Time   COLORURINE YELLOW 09/29/2018 1252   APPEARANCEUR CLEAR 09/29/2018 1252   LABSPEC 1.015 09/29/2018 1252   PHURINE 5.0 09/29/2018 1252   GLUCOSEU 150 (A) 09/29/2018 1252   HGBUR NEGATIVE 09/29/2018 1252   BILIRUBINUR NEGATIVE 09/29/2018 1252   BILIRUBINUR neg 03/11/2012 1538  KETONESUR NEGATIVE 09/29/2018 1252   PROTEINUR NEGATIVE 09/29/2018 1252   UROBILINOGEN 0.2 10/13/2013 1517   NITRITE NEGATIVE 09/29/2018 1252   LEUKOCYTESUR NEGATIVE 09/29/2018 1252    Recent Results (from the past 240 hour(s))  SARS Coronavirus 2 (CEPHEID - Performed in [facility] hospital lab), Hosp Order     Status: None   Collection Time: 09/29/18 12:28 PM   Specimen: Nasopharyngeal Swab  Result Value Ref Range Status   SARS Coronavirus 2 NEGATIVE NEGATIVE  Final    Comment: (NOTE) If result is NEGATIVE SARS-CoV-2 target nucleic acids are NOT DETECTED. The SARS-CoV-2 RNA is generally detectable in upper and lower  respiratory specimens during the acute phase of infection. The lowest  concentration of SARS-CoV-2 viral copies this assay can detect is 250  copies / mL. A negative result does not preclude SARS-CoV-2 infection  and should not be used as the sole basis for treatment or other  patient management decisions.  A negative result may occur with  improper specimen collection / handling, submission of specimen other  than nasopharyngeal swab, presence of viral mutation(s) within the  areas targeted by this assay, and inadequate number of viral copies  (<250 copies / mL). A negative result must be combined with clinical  observations, patient history, and epidemiological information. If result is POSITIVE SARS-CoV-2 target nucleic acids are DETECTED. The SARS-CoV-2 RNA is generally detectable in upper and lower  respiratory specimens dur ing the acute phase of infection.  Positive  results are indicative of active infection with SARS-CoV-2.  Clinical  correlation with patient history and other diagnostic information is  necessary to determine patient infection status.  Positive results do  not rule out bacterial infection or co-infection with other viruses. If result is PRESUMPTIVE POSTIVE SARS-CoV-2 nucleic acids MAY BE PRESENT.   A presumptive positive result was obtained on the submitted specimen  and confirmed on repeat testing.  While 2019 novel coronavirus  (SARS-CoV-2) nucleic acids may be present in the submitted sample  additional confirmatory testing may be necessary for epidemiological  and / or clinical management purposes  to differentiate between  SARS-CoV-2 and other Sarbecovirus currently known to infect humans.  If clinically indicated additional testing with an alternate test  methodology [phone]) is advised. The  SARS-CoV-2 RNA is generally  detectable in upper and lower respiratory sp ecimens during the acute  phase of infection. The expected result is Negative. Fact Sheet for Patients:  BoilerBrush.com.cy Fact Sheet for Healthcare Providers: https://pope.com/ This test is not yet approved or cleared by the [place] FDA and has been authorized for detection and/or diagnosis of SARS-CoV-2 by FDA under an Emergency Use Authorization (EUA).  This EUA will remain in effect (meaning this test can be used) for the duration of the COVID-19 declaration under Section 564(b)(1) of the Act, 21 U.S.C. section 360bbb-3(b)(1), unless the authorization is terminated or revoked sooner. Performed at [facility], [address] [place], [place] [address]      Radiology Studies: Mr Brain Wo Contrast (neuro Protocol)  Result Date: 09/30/2018 CLINICAL DATA:  Generalized weakness and blurred vision beginning yesterday. EXAM: MRI HEAD WITHOUT CONTRAST TECHNIQUE: Multiplanar, multiecho pulse sequences of the brain and surrounding structures were obtained without intravenous contrast. COMPARISON:  Head CT 09/29/2018 FINDINGS: Brain: Diffusion imaging does not show any acute or subacute infarction. There are mild chronic small-vessel ischemic changes of the pons. No focal cerebellar insult. 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 Cerebral hemispheres show moderate chronic small-vessel  ischemic changes of the deep and subcortical white matter. No cortical or large vessel territory infarction. No mass lesion, hemorrhage, hydrocephalus or extra-axial collection. Vascular: Major vessels at the base of the brain show flow. Skull and upper cervical spine: Negative Sinuses/Orbits: Clear/normal Other: None IMPRESSION: No acute finding by MRI. 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 Chronic small-vessel ischemic changes of the pons and cerebral hemispheric white matter.  Electronically Signed   By: Paulina Fusi M.D.   On: 09/30/2018 10:17   US Soft Tissue Head & Neck (non-thyroid)  Result Date: 09/30/2018 CLINICAL DATA:  Tender swollen area of the left neck. EXAM: ULTRASOUND OF HEAD/NECK SOFT TISSUES TECHNIQUE: Ultrasound examination of the head and neck soft tissues was performed in the area of clinical concern. COMPARISON:  None. FINDINGS: At the area of the palpable abnormality, in the left neck at the level 2 nodal station, there is a 2.6 x 1.9 x 2.3 cm ovoid necrotic structure with multiple septations most consistent with a necrotic lymph node. This is a worrisome finding and may indicate an underlying head and neck carcinoma. CT scan of the neck with contrast is suggested. IMPRESSION: In 2.6 x 1.9 x 2.3 cm necrotic level 2 lymph node on the left. This is a worrisome finding and could indicate underlying head and neck carcinoma. Neck CT with contrast is recommended. Electronically Signed   By: Paulina Fusi M.D.   On: 09/30/2018 13:16   US Carotid Bilateral (at [facility] And [facility] Only)  Result Date: 09/30/2018 CLINICAL DATA:  TIA. Blurry vision. Syncopal episode. History of hypertension and smoking. EXAM: BILATERAL CAROTID DUPLEX ULTRASOUND TECHNIQUE: Wallace Cullens scale imaging, color Doppler and duplex ultrasound were performed of bilateral carotid and vertebral arteries in the neck. COMPARISON:  None. FINDINGS: Criteria: Quantification of carotid stenosis is based on velocity parameters that correlate the residual internal carotid diameter with NASCET-based stenosis levels, using the diameter of the distal internal carotid lumen as the denominator for stenosis measurement. The following velocity measurements were obtained: RIGHT ICA: 141/15 cm/sec CCA: 135/13 cm/sec SYSTOLIC ICA/CCA RATIO:  1.0 ECA: 237 cm/sec LEFT ICA: 199/23 cm/sec CCA: 125/15 cm/sec SYSTOLIC ICA/CCA RATIO:  1.6 ECA: 311 cm/sec RIGHT CAROTID ARTERY: There is a moderate to large amount of eccentric echogenic  shadowing plaque within the right carotid bulb (image 16), extending to involve the origin and proximal aspects of the right internal carotid artery (image 26), which results in elevated peak systolic velocities within the proximal mid aspects the right internal carotid artery. Greatest acquired peak systolic velocity within mid right ICA measures 141 centimeters/second (image 30). RIGHT VERTEBRAL ARTERY:  Antegrade Flow LEFT CAROTID ARTERY: There is a moderate to large amount of eccentric echogenic plaque within the left carotid bulb (image 52), extending to involve the origin and proximal aspects of the left internal carotid artery (image 61), which results in elevated peak systolic velocities throughout the interrogated course the left internal carotid artery. Greatest acquired peak systolic velocity within the mid left ICA measures 199 centimeters/second (image 66). LEFT VERTEBRAL ARTERY:  Antegrade Flow IMPRESSION: Moderate to large amount of atherosclerotic plaque, left greater than right, results in elevated peak systolic velocities within the bilateral internal carotid arteries, compatible with the 50-69% luminal narrowing range bilaterally. Further evaluation with CTA could performed as clinically indicated. Electronically Signed   By: Simonne Come M.D.   On: 09/30/2018 12:10   Ct Head Code Stroke Wo Contrast  Result Date: 09/29/2018 CLINICAL DATA:  Code stroke. Dizziness and generalized weakness  since 8 a.m. today. EXAM: CT HEAD WITHOUT CONTRAST TECHNIQUE: Contiguous axial images were obtained from the base of the skull through the vertex without intravenous contrast. COMPARISON:  Report of CT head 01/14/2001. Images are not available. FINDINGS: Brain: Mild generalized atrophy and moderate diffuse subcortical white matter disease is present bilaterally. Basal ganglia are intact. Age indeterminate lacunar infarct of the left thalamus is likely remote. No focal cortical abnormalities are present. The  ventricles are of normal size. The brainstem and cerebellum are within normal limits. Vascular: Atherosclerotic calcifications are present within the cavernous internal carotid arteries bilaterally. There is no asymmetric hyperdense vessel. Skull: Calvarium is intact. No focal lytic or blastic lesions are present. Sinuses/Orbits: The paranasal sinuses and mastoid air cells are clear. The globes and orbits are within normal limits. ASPECTS [facility] Stroke Program Early CT Score) - Ganglionic level infarction (caudate, lentiform nuclei, internal capsule, insula, M1-M3 cortex): 7/7 - Supraganglionic infarction (M4-M6 cortex): 3/3 Total score (0-10 with 10 being normal): 10/10 IMPRESSION: 1. Mild generalized atrophy and moderate diffuse white matter disease without acute cortical infarct. 2. 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 Given the other white matter disease. This is likely remote. 3. Atherosclerosis.  No hyperdense vessel. 4. ASPECTS is 10/10 These results were called by telephone at the time of interpretation on 09/29/2018 at 11:47 am to Dr. Alona Bene , who verbally acknowledged these results. Electronically Signed   By: Marin Roberts M.D.   On: 09/29/2018 11:47    Scheduled Meds: . aspirin  325 mg Oral Daily  . diltiazem  120 mg Oral BID  . enoxaparin (LOVENOX) injection  40 mg Subcutaneous Q24H  . escitalopram  5 mg Oral Daily  . gabapentin  300 mg Oral BID  . hydrALAZINE  25 mg Oral TID  . pantoprazole  40 mg Oral Daily  . traZODone  150 mg Oral QHS   Continuous Infusions: . sodium chloride 50 mL/hr at 09/30/18 1527     LOS: 0 days    Time spent: 30 minutes.    Vassie Loll, MD [facility] Pager [phone]   09/30/2018, 5:19 PM

## 2018-09-30 NOTE — Progress Notes (Signed)
SLP Cancellation Note  Patient Details Name: GAELLE ADRIANCE MRN: [id number] DOB: 1950/11/16   Cancelled treatment:       Reason Eval/Treat Not Completed: SLP screened, no needs identified, will sign off; SLP screened Pt in room. Pt denies any changes in swallowing, speech, language, or cognition. MRI negative for acute changes. SLE will be deferred at this time. Reconsult if indicated. SLP will sign off.   Thank you,  Genene Churn, [place]  [place] 09/30/2018, 11:54 AM

## 2018-09-30 NOTE — Plan of Care (Signed)
  Problem: Acute Rehab PT Goals(only PT should resolve) Goal: Pt Will Ambulate Outcome: Progressing Flowsheets (Taken 09/30/2018 1050) Pt will Ambulate:  75 feet  with modified independence Note: Use SPC if necessary Goal: Pt Will Go Up/Down Stairs Outcome: Progressing Flowsheets (Taken 09/30/2018 1050) Pt will Go Up / Down Stairs:  3-5 stairs  with modified independence  with rail(s) Note: Use SPC if necessary   10:52 AM, 09/30/18 Lonell Grandchild, MPT Physical Therapist with [facility] [phone] office [phone] mobile phone

## 2018-09-30 NOTE — Plan of Care (Signed)

## 2018-09-30 NOTE — Progress Notes (Signed)
OT Cancellation Note  Patient Details Name: Paula Long MRN: [id number] DOB: 1951/02/04   Cancelled Treatment:    Reason Eval/Treat Not Completed: OT screened, no needs identified, will sign off. Spoke with patient who was sitting up in recliner eating breakfast. She reports that her blurry vision is not clear although better than it was when she first arrived. She reports that the new onset of weakness on her right side has resolved. She was able to walk with PT this morning without issues. She lives with her fiance and is independent with all ADL tasks. She does voice any concerns returning home. No further OT needs at this time. Thank you for the referral.   Ailene Ravel, OTR/L,CBIS  [phone]  09/30/2018, 8:42 AM

## 2018-09-30 NOTE — Evaluation (Signed)
Physical Therapy Evaluation Patient Details Name: Paula Long MRN: [id number] DOB: 21-Jul-1950 Today's Date: 09/30/2018   History of Present Illness  Paula Long is a 68 y.o. female family history significant for anxiety, hypertension, fibromyalgia, chronic back pain, gastroesophageal reflux disease, tobacco abuse and COPD; who presented to the emergency department secondary to weakness (acute, generalized, eating she expressed left-sided more affected) and bilateral blurry vision (specifying right worse than left).  The symptoms presented suddenly around 8:30 AM in the morning and were associated with sweating and diaphoresis.  Patient expressed that she woke up feeling blue and was preparing breakfast when she first experienced symptoms.  Patient denies chest pain, palpitations, headaches, photophobia, neck rigidity, coughing spells, dysuria, hematuria, melena, hematochezia, sick contacts, fever, chills or any other complaints.    Clinical Impression  Patient functioning near baseline for functional mobility and gait, limited mostly due to baseline LLE weakness/pain, occasionally leans non nearby objects for support when walking in hallway without loss of balance, able to go up/down a couple steps on stairwell using side rail and tolerated sitting up in chair after therapy.  Patient will benefit from continued physical therapy in hospital to increase strength, balance, endurance for safe ADLs and gait.     Follow Up Recommendations No PT follow up;Supervision - Intermittent    Equipment Recommendations  None recommended by PT    Recommendations for Other Services       Precautions / Restrictions Precautions Precautions: Fall Restrictions Weight Bearing Restrictions: No      Mobility  Bed Mobility Overal bed mobility: Modified Independent             General bed mobility comments: increased time  Transfers Overall transfer level: Modified independent Equipment used:  None             General transfer comment: increased time, slightly labored movement  Ambulation/Gait Ambulation/Gait assistance: Supervision Gait Distance (Feet): 60 Feet Assistive device: None Gait Pattern/deviations: Decreased step length - left;Decreased stance time - left;Decreased stride length Gait velocity: decreased   General Gait Details: slightly labored cadence with occasional leaning on side rails in hallway, no loss of balance, limited mostly due to c/o LLE discomfort/pain  Stairs Stairs: Yes Stairs assistance: Modified independent (Device/Increase time) Stair Management: Step to pattern;One rail Left Number of Stairs: 2 General stair comments: demonstrates good return for going up/down 2 steps using 1 siderail without loss of balance  Wheelchair Mobility    Modified Rankin (Stroke Patients Only)       Balance Overall balance assessment: Mild deficits observed, not formally tested                                           Pertinent Vitals/Pain Pain Assessment: Faces Faces Pain Scale: Hurts little more Pain Location: LLE when walking Pain Descriptors / Indicators: Sore;Discomfort Pain Intervention(s): Limited activity within patient's tolerance;Monitored during session    Home Living Family/patient expects to be discharged to:: Private residence Living Arrangements: Non-relatives/Friends Available Help at Discharge: Friend(s) Type of Home: House Home Access: Stairs to enter Entrance Stairs-Rails: None(uses post to help get up/down steps) Entrance Stairs-Number of Steps: 2 Home Layout: One level Home Equipment: Cane - single point;Walker - 2 wheels      Prior Function Level of Independence: Independent         Comments: household and short distanced community ambulator with occasional  leaning on objects for nearby support due to chronic LLE weakness     Hand Dominance   Dominant Hand: Right    Extremity/Trunk  Assessment   Upper Extremity Assessment Upper Extremity Assessment: Defer to OT evaluation    Lower Extremity Assessment Lower Extremity Assessment: RLE deficits/detail;LLE deficits/detail RLE Deficits / Details: grossly 5/5 LLE Deficits / Details: grossly -4/5 (baseline possibly due to nerve damage per patient) LLE: Unable to fully assess due to pain    Cervical / Trunk Assessment Cervical / Trunk Assessment: Kyphotic  Communication   Communication: No difficulties  Cognition Arousal/Alertness: Awake/alert Behavior During Therapy: WFL for tasks assessed/performed Overall Cognitive Status: Within Functional Limits for tasks assessed                                        General Comments      Exercises     Assessment/Plan    PT Assessment Patient needs continued PT services  PT Problem List Decreased strength;Decreased activity tolerance;Decreased balance;Decreased mobility;Pain       PT Treatment Interventions Balance training;Gait training;Stair training;Functional mobility training;Therapeutic activities;Therapeutic exercise;Patient/family education    PT Goals (Current goals can be found in the Care Plan section)  Acute Rehab PT Goals Patient Stated Goal: return home PT Goal Formulation: With patient Time For Goal Achievement: 10/03/18 Potential to Achieve Goals: Good    Frequency Min 3X/week   Barriers to discharge        Co-evaluation               AM-PAC PT "6 Clicks" Mobility  Outcome Measure Help needed turning from your back to your side while in a flat bed without using bedrails?: None Help needed moving from lying on your back to sitting on the side of a flat bed without using bedrails?: None Help needed moving to and from a bed to a chair (including a wheelchair)?: None Help needed standing up from a chair using your arms (e.g., wheelchair or bedside chair)?: None Help needed to walk in hospital room?: A Little Help needed  climbing 3-5 steps with a railing? : A Little 6 Click Score: 22    End of Session   Activity Tolerance: Patient tolerated treatment well;Patient limited by pain Patient left: in chair;with call bell/phone within reach Nurse Communication: Mobility status PT Visit Diagnosis: Unsteadiness on feet (R26.81);Other abnormalities of gait and mobility (R26.89);Muscle weakness (generalized) (M62.81)    Time: 6294-7654 PT Time Calculation (min) (ACUTE ONLY): 27 min   Charges:   PT Evaluation $PT Eval Moderate Complexity: 1 Mod PT Treatments $Therapeutic Activity: 23-37 mins        10:48 AM, 09/30/18 Lonell Grandchild, MPT Physical Therapist with [facility] 336 [phone] office [phone] mobile phone

## 2018-09-30 NOTE — Care Management Obs Status (Signed)
[facility] NOTIFICATION   Patient Details  Name: IKESHA SILLER MRN: [id number] Date of Birth: June 08, 1950   Medicare Observation Status Notification Given:  Yes    Tommy Medal 09/30/2018, 2:11 PM

## 2018-09-30 NOTE — Consult Note (Addendum)
Paula A. [name], MD     [email or web address]          Paula Long is an 68 y.o. female.   ASSESSMENT/PLAN: 1.  Acute episode of dizziness of unclear etiology: Consider orthostatics for further evaluation.  An MRA will also be obtained.  2.  Remote left thalamic infarct: Risk factors age, hypertension and prediabetes.  This could be related to the patient's episode of falling and right-sided pain about 3 months ago.  The patient probably has been placed on aspirin.  Carotid duplex Doppler and MRA are also appropriate.  Follow-up lipid panel  3.  Vitamin B12 deficiency: This will be replaced.  4.  Left anterior cervical lump  5.  Bilateral asymptomatic carotid stenosis: Aspirin and statin are recommended.     Patient is a 68 year old white female who presents with acute onset of vertiginous symptoms described mostly as a lightheaded-like sensation and disequilibrium.  She reports generalized weakness.  She has had some visual problems but this apparently was not worse recently with his current event.  She reports that the entire event lasted for about 2-3 minutes.  She does not report associated chest pain, palpitation or shortness of breath.  She denies dysarthria or dysphagia.  She denies focal weakness or numbness that is new.  She has a long history of left leg weakness.  She has had some chronic pain problems in the past.  She has some visual problems off and on but not worse with her current event.  She reports having a lump involving the left upper cervical area.  The review of systems otherwise negative.    GENERAL: This a pleasant female who is in no acute distress at this time.  HEENT: There appears to be a well-circumscribed lesion involving the cervical upper area on the left side.  Seems most consistent with a lipoma.  ABDOMEN: soft  EXTREMITIES: No edema   BACK: Normal  SKIN: Normal by inspection.    MENTAL STATUS: Alert and oriented  -including orientation to the month and her age. Speech, language and cognition are generally intact. Judgment and insight normal.   CRANIAL NERVES: Pupils are equal, round and reactive to light and accomodation; extra ocular movements are full, there is no significant nystagmus; visual fields are full; upper and lower facial muscles are normal in strength and symmetric, there is no flattening of the nasolabial folds; tongue is midline; uvula is midline; shoulder elevation is normal.  MOTOR: There is mild weakness involving the left hip flexion graded as 4+/5.  There is also mild drift involving the left lower extremity.  There is no drift of the upper extremities or the right leg.  The other extremity shows normal tone, bulk and strength.  COORDINATION: Left finger to nose is normal, right finger to nose is normal, No rest tremor; no intention tremor; no postural tremor; no bradykinesia.  REFLEXES: Deep tendon reflexes are symmetrical and normal. Plantar reflexes are flexor bilaterally.   SENSATION: Left leg shows impaired sensation to light touch and temperature   NIH stroke scale 1, 1 total 2  Blood pressure (!) 166/47, pulse 71, temperature 97.6 F (36.4 C), temperature source Oral, resp. rate 18, height 5\' 5"  (1.651 m), weight 94.6 kg, SpO2 95 %.  Past Medical History:  Diagnosis Date   Anxiety    Arthritis 12-07-11   arthritis,DDD,spinal stenosis   Chronic back pain    COPD (chronic obstructive pulmonary disease) ([place]) 12-07-11   pt. uses nebulizer  as needed and inhaler daily   Fibromyalgia    skin cancer   GERD (gastroesophageal reflux disease)    Hemorrhoids    Hypertension 12-07-11   presently no meds   Normal cardiac stress test 06/2009   Shortness of breath 12-07-11   less now, occ wheezes   Skin cancer    melanoma.  face 2014    Past Surgical History:  Procedure Laterality Date   ABDOMINAL HYSTERECTOMY     BACK SURGERY  12-07-11   2'10 L5 x2    COLONOSCOPY WITH PROPOFOL N/A 06/03/2014   Procedure: COLONOSCOPY WITH PROPOFOL; IN CECUM AT 0750; TOTAL WITHDRAWAL TIME 16 MINUTES;  Surgeon: Rogene Houston, MD;  Location: [facility];  Service: Endoscopy;  Laterality: N/A;   left ankle surgery     tendonitis   LUMBAR LAMINECTOMY/DECOMPRESSION MICRODISCECTOMY  12/11/2011   Procedure: LUMBAR LAMINECTOMY/DECOMPRESSION MICRODISCECTOMY;  Surgeon: Tobi Bastos, MD;  Location: [facility];  Service: Orthopedics;  Laterality: Left;  Decompressive Lumbar Laminectomy L5-S1 on Left   POLYPECTOMY N/A 06/03/2014   Procedure: POLYPECTOMY;  Surgeon: Rogene Houston, MD;  Location: [facility];  Service: Endoscopy;  Laterality: N/A;   right wrist surgery for pinched nerve     trigger finger surgery  12-07-11   rt. middle trigger finger release    Family History  Problem Relation Age of Onset   Cancer Mother        uterine   Cancer Father        lung    Social History:  reports that she has been smoking cigarettes. She started smoking about 48 years ago. She has a 96.00 pack-year smoking history. She has never used smokeless tobacco. She reports that she does not drink alcohol or use drugs.  Allergies:  Allergies  Allergen Reactions   Sulfa Antibiotics    Nystatin Rash    Oral rash   Percocet [Oxycodone-Acetaminophen] Rash    Oral rash   Vesicare [Solifenacin] Rash   Vicodin [Hydrocodone-Acetaminophen] Rash    Oral rash    Medications: Prior to Admission medications   Medication Sig Start Date End Date Taking? Authorizing Provider  albuterol (PROVENTIL) (2.5 MG/3ML) 0.083% nebulizer solution INHALE THE CONTENTS OF 1 VIAL VIA NEBULIZER EVERY 4 HOURS AS NEEDED FOR WHEEZING OR SHORTNESS OF BREATH 09/18/16  Yes [place], Modena Nunnery, MD  albuterol (VENTOLIN HFA) 108 (90 Base) MCG/ACT inhaler INHALE 2 PUFFS EVERY 4 HOURS AS NEEDED FOR WHEEZING OR SHORTNESS OF BREATH 09/23/18  Yes [place], Modena Nunnery, MD  amoxicillin-clavulanate (AUGMENTIN) 875-125 MG  tablet Take 1 tablet by mouth 2 (two) times daily. 09/24/18  Yes Susy Frizzle, MD  desmopressin (DDAVP) 0.1 MG tablet TAKE 1 TABLET AT BEDTIME FOR BLADDER 09/05/18  Yes [place], Modena Nunnery, MD  DEXILANT 60 MG capsule TAKE 1 CAPSULE EVERY DAY 07/09/18  Yes [place], Modena Nunnery, MD  diltiazem (CARDIZEM) 120 MG tablet Take 1 tablet (120 mg total) by mouth 2 (two) times daily. 09/27/18  Yes [place], Alphonse Guild, MD  EASYMAX TEST test strip USE TO TEST ONCE DAILY. 06/03/15  Yes Susy Frizzle, MD  escitalopram (LEXAPRO) 5 MG tablet Take 1 tablet (5 mg total) by mouth daily. 09/16/18  Yes [place], Modena Nunnery, MD  furosemide (LASIX) 20 MG tablet Take 1 tablet (20 mg total) by mouth daily as needed. 09/04/18  Yes [place], Modena Nunnery, MD  gabapentin (NEURONTIN) 400 MG capsule TAKE 2 CAPSULES AT BEDTIME 03/06/18  Yes [place], Modena Nunnery, MD  hydrALAZINE (  APRESOLINE) 50 MG tablet Take 1 tablet (50 mg total) by mouth 3 (three) times daily. 09/27/18  Yes Arnoldo Lenis, MD  IBU 600 MG tablet TAKE 1 TABLET TWICE DAILY AS NEEDED 08/28/18  Yes [place], Modena Nunnery, MD  nystatin (MYCOSTATIN/NYSTOP) powder APPLY  TOPICALLY TWICE DAILY AS NEEDED 07/29/18  Yes Alycia Rossetti, MD  traZODone (DESYREL) 150 MG tablet TAKE 1 TABLET AT BEDTIME 08/28/18  Yes [place], Modena Nunnery, MD    Scheduled Meds:  aspirin  325 mg Oral Daily   diltiazem  120 mg Oral BID   enoxaparin (LOVENOX) injection  40 mg Subcutaneous Q24H   escitalopram  5 mg Oral Daily   gabapentin  300 mg Oral BID   hydrALAZINE  25 mg Oral TID   pantoprazole  40 mg Oral Daily   traZODone  150 mg Oral QHS   Continuous Infusions:  sodium chloride 50 mL/hr at 09/30/18 1527   PRN Meds:.acetaminophen **OR** acetaminophen (TYLENOL) oral liquid 160 mg/5 mL **OR** acetaminophen, albuterol     Results for orders placed or performed during the hospital encounter of 09/29/18 (from the past 48 hour(s))  HIV antibody (Routine Testing)     Status: None   Collection  Time: 09/29/18 11:15 AM  Result Value Ref Range   HIV Screen 4th Generation wRfx Non Reactive Non Reactive    Comment: (NOTE) Performed At: [facility] [address] [place], [place] [id number] Rush Farmer MD [place]:[id number]   Ethanol     Status: None   Collection Time: 09/29/18 11:16 AM  Result Value Ref Range   Alcohol, Ethyl (B) <10 <10 mg/dL    Comment: (NOTE) Lowest detectable limit for serum alcohol is 10 mg/dL. For medical purposes only. Performed at [facility], [address], [place], [place] [address]   Protime-INR     Status: None   Collection Time: 09/29/18 11:16 AM  Result Value Ref Range   Prothrombin Time 12.3 11.4 - 15.2 seconds   INR 0.9 0.8 - 1.2    Comment: (NOTE) INR goal varies based on device and disease states. Performed at [facility], [address], [place], [place] [address]   APTT     Status: None   Collection Time: 09/29/18 11:16 AM  Result Value Ref Range   aPTT 27 24 - 36 seconds    Comment: Performed at [facility], [address] [place], [place] [address]  CBC     Status: None   Collection Time: 09/29/18 11:16 AM  Result Value Ref Range   WBC 8.9 4.0 - 10.5 K/uL   RBC 4.44 3.87 - 5.11 MIL/uL   Hemoglobin 13.5 12.0 - 15.0 g/dL   HCT 40.9 36.0 - 46.0 %   MCV 92.1 80.0 - 100.0 fL   MCH 30.4 26.0 - 34.0 pg   MCHC 33.0 30.0 - 36.0 g/dL   RDW 14.4 11.5 - 15.5 %   Platelets 243 150 - 400 K/uL   nRBC 0.0 0.0 - 0.2 %    Comment: Performed at [facility], [address] [place], [place] [address]  Differential     Status: None   Collection Time: 09/29/18 11:16 AM  Result Value Ref Range   Neutrophils Relative % 80 %   Neutro Abs 7.2 1.7 - 7.7 K/uL   Lymphocytes Relative 10 %   Lymphs Abs 0.9 0.7 - 4.0 K/uL   Monocytes Relative 7 %   Monocytes Absolute 0.6 0.1 - 1.0 K/uL   Eosinophils Relative  1 %   Eosinophils Absolute 0.1 0.0 - 0.5 K/uL   Basophils Relative 1 %   Basophils Absolute 0.1 0.0 - 0.1 K/uL   Immature  Granulocytes 1 %   Abs Immature Granulocytes 0.04 0.00 - 0.07 K/uL    Comment: Performed at [facility], [address] [place], [place] [address]  Comprehensive metabolic panel     Status: Abnormal   Collection Time: 09/29/18 11:16 AM  Result Value Ref Range   Sodium 130 (L) 135 - 145 mmol/L   Potassium 4.2 3.5 - 5.1 mmol/L   Chloride 96 (L) 98 - 111 mmol/L   CO2 24 22 - 32 mmol/L   Glucose, Bld 127 (H) 70 - 99 mg/dL   BUN 25 (H) 8 - 23 mg/dL   Creatinine, Ser 1.63 (H) 0.44 - 1.00 mg/dL   Calcium 8.9 8.9 - 10.3 mg/dL   Total Protein 7.5 6.5 - 8.1 g/dL   Albumin 4.1 3.5 - 5.0 g/dL   AST 15 15 - 41 U/L   ALT 14 0 - 44 U/L   Alkaline Phosphatase 73 38 - 126 U/L   Total Bilirubin 0.5 0.3 - 1.2 mg/dL   GFR calc non Af Amer 32 (L) >60 mL/min   GFR calc Af Amer 38 (L) >60 mL/min   Anion gap 10 5 - 15    Comment: Performed at [facility], [address] [place], [place] [address]  CBG monitoring, ED     Status: Abnormal   Collection Time: 09/29/18 11:39 AM  Result Value Ref Range   Glucose-Capillary 121 (H) 70 - 99 mg/dL  I-stat chem 8, ED     Status: Abnormal   Collection Time: 09/29/18 11:43 AM  Result Value Ref Range   Sodium 129 (L) 135 - 145 mmol/L   Potassium 4.2 3.5 - 5.1 mmol/L   Chloride 95 (L) 98 - 111 mmol/L   BUN 24 (H) 8 - 23 mg/dL   Creatinine, Ser 1.70 (H) 0.44 - 1.00 mg/dL   Glucose, Bld 122 (H) 70 - 99 mg/dL   Calcium, Ion 1.15 1.15 - 1.40 mmol/L   TCO2 25 22 - 32 mmol/L   Hemoglobin 15.3 (H) 12.0 - 15.0 g/dL   HCT 45.0 36.0 - 46.0 %  SARS Coronavirus 2 (CEPHEID - Performed in [place] hospital lab), Hosp Order     Status: None   Collection Time: 09/29/18 12:28 PM   Specimen: Nasopharyngeal Swab  Result Value Ref Range   SARS Coronavirus 2 NEGATIVE NEGATIVE    Comment: (NOTE) If result is NEGATIVE SARS-CoV-2 target nucleic acids are NOT DETECTED. The SARS-CoV-2 RNA is generally detectable in upper and lower  respiratory specimens during the acute phase  of infection. The lowest  concentration of SARS-CoV-2 viral copies this assay can detect is 250  copies / mL. A negative result does not preclude SARS-CoV-2 infection  and should not be used as the sole basis for treatment or other  patient management decisions.  A negative result may occur with  improper specimen collection / handling, submission of specimen other  than nasopharyngeal swab, presence of viral mutation(s) within the  areas targeted by this assay, and inadequate number of viral copies  (<250 copies / mL). A negative result must be combined with clinical  observations, patient history, and epidemiological information. If result is POSITIVE SARS-CoV-2 target nucleic acids are DETECTED. The SARS-CoV-2 RNA is generally detectable in upper and lower  respiratory specimens dur ing the acute phase  of infection.  Positive  results are indicative of active infection with SARS-CoV-2.  Clinical  correlation with patient history and other diagnostic information is  necessary to determine patient infection status.  Positive results do  not rule out bacterial infection or co-infection with other viruses. If result is PRESUMPTIVE POSTIVE SARS-CoV-2 nucleic acids MAY BE PRESENT.   A presumptive positive result was obtained on the submitted specimen  and confirmed on repeat testing.  While 2019 novel coronavirus  (SARS-CoV-2) nucleic acids may be present in the submitted sample  additional confirmatory testing may be necessary for epidemiological  and / or clinical management purposes  to differentiate between  SARS-CoV-2 and other Sarbecovirus currently known to infect humans.  If clinically indicated additional testing with an alternate test  methodology [phone]) is advised. The SARS-CoV-2 RNA is generally  detectable in upper and lower respiratory sp ecimens during the acute  phase of infection. The expected result is Negative. Fact Sheet for Patients:   StrictlyIdeas.no Fact Sheet for Healthcare Providers: BankingDealers.co.za This test is not yet approved or cleared by the [place] FDA and has been authorized for detection and/or diagnosis of SARS-CoV-2 by FDA under an Emergency Use Authorization (EUA).  This EUA will remain in effect (meaning this test can be used) for the duration of the COVID-19 declaration under Section 564(b)(1) of the Act, 21 U.S.C. section 360bbb-3(b)(1), unless the authorization is terminated or revoked sooner. Performed at [facility], [address], [place], [place] [address]   Urine rapid drug screen (hosp performed)     Status: None   Collection Time: 09/29/18 12:52 PM  Result Value Ref Range   Opiates NONE DETECTED NONE DETECTED   Cocaine NONE DETECTED NONE DETECTED   Benzodiazepines NONE DETECTED NONE DETECTED   Amphetamines NONE DETECTED NONE DETECTED   Tetrahydrocannabinol NONE DETECTED NONE DETECTED   Barbiturates NONE DETECTED NONE DETECTED    Comment: (NOTE) DRUG SCREEN FOR MEDICAL PURPOSES ONLY.  IF CONFIRMATION IS NEEDED FOR ANY PURPOSE, NOTIFY LAB WITHIN 5 DAYS. LOWEST DETECTABLE LIMITS FOR URINE DRUG SCREEN Drug Class                     Cutoff (ng/mL) Amphetamine and metabolites    1000 Barbiturate and metabolites    200 Benzodiazepine                 875 Tricyclics and metabolites     300 Opiates and metabolites        300 Cocaine and metabolites        300 THC                            50 Performed at [facility], [address] [place], [place] [address]   Urinalysis, Routine w reflex microscopic     Status: Abnormal   Collection Time: 09/29/18 12:52 PM  Result Value Ref Range   Color, Urine YELLOW YELLOW   APPearance CLEAR CLEAR   Specific Gravity, Urine 1.015 1.005 - 1.030   pH 5.0 5.0 - 8.0   Glucose, UA 150 (A) NEGATIVE mg/dL   Hgb urine dipstick NEGATIVE NEGATIVE   Bilirubin Urine NEGATIVE NEGATIVE   Ketones, ur  NEGATIVE NEGATIVE mg/dL   Protein, ur NEGATIVE NEGATIVE mg/dL   Nitrite NEGATIVE NEGATIVE   Leukocytes,Ua NEGATIVE NEGATIVE    Comment: Performed at [facility], [address] [place], [place] [address]  Hemoglobin A1c  Status: Abnormal   Collection Time: 09/30/18  6:13 AM  Result Value Ref Range   Hgb A1c MFr Bld 6.1 (H) 4.8 - 5.6 %    Comment: (NOTE) Pre diabetes:          5.7%-6.4% Diabetes:              >6.4% Glycemic control for   <7.0% adults with diabetes    Mean Plasma Glucose 128.37 mg/dL    Comment: Performed at [facility] [address] [place], [place] [address]  Lipid panel     Status: None   Collection Time: 09/30/18  6:13 AM  Result Value Ref Range   Cholesterol 110 0 - 200 mg/dL   Triglycerides 37 <150 mg/dL   HDL 49 >40 mg/dL   Total CHOL/HDL Ratio 2.2 RATIO   VLDL 7 0 - 40 mg/dL   LDL Cholesterol 54 0 - 99 mg/dL    Comment:        Total Cholesterol/HDL:CHD Risk Coronary Heart Disease Risk Table                     Men   Women  1/2 Average Risk   3.4   3.3  Average Risk       5.0   4.4  2 X Average Risk   9.6   7.1  3 X Average Risk  23.4   11.0        Use the calculated Patient Ratio above and the CHD Risk Table to determine the patient's CHD Risk.        ATP III CLASSIFICATION (LDL):  <100     mg/dL   Optimal  100-129  mg/dL   Near or Above                    Optimal  130-159  mg/dL   Borderline  160-189  mg/dL   High  >190     mg/dL   Very High Performed at [facility], [address], [place], [place] [address]   Vitamin B12     Status: None   Collection Time: 09/30/18  6:13 AM  Result Value Ref Range   Vitamin B-12 208 180 - 914 pg/mL    Comment: (NOTE) This assay is not validated for testing neonatal or myeloproliferative syndrome specimens for Vitamin B12 levels. Performed at [facility], [address], [place], [place] [address]   TSH     Status: None   Collection Time: 09/30/18  6:13 AM  Result Value Ref Range   TSH  1.571 0.350 - 4.500 uIU/mL    Comment: Performed by a 3rd Generation assay with a functional sensitivity of <=0.01 uIU/mL. Performed at [facility], [address], [place], [place] [address]     Studies/Results:  Brain MRI  FINDINGS: Brain: Diffusion imaging does not show any acute or subacute infarction. There are mild chronic small-vessel ischemic changes of the pons. No focal cerebellar insult. 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 Cerebral hemispheres show moderate chronic small-vessel ischemic changes of the deep and subcortical white matter. No cortical or large vessel territory infarction. No mass lesion, hemorrhage, hydrocephalus or extra-axial collection.  Vascular: Major vessels at the base of the brain show flow.  Skull and upper cervical spine: Negative  Sinuses/Orbits: Clear/normal  Other: None  IMPRESSION: No acute finding by MRI. 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 Chronic small-vessel ischemic changes of the pons and cerebral hemispheric  white matter.     The brain MRI is reviewed in person.  No clear acute findings are seen on DWI.  There is a small encephalomalacia associated increase in on FLAIR imaging involving the left thalamic region consistent with a remote infarct.  There is mild to moderate deep and periventricular leukoencephalopathy.      TTE   1. The left ventricle has hyperdynamic systolic function, with an ejection fraction of >65%. The cavity size was normal. There is mildly increased left ventricular wall thickness. Left ventricular diastolic Doppler parameters are consistent with  impaired relaxation. No evidence of left ventricular regional wall motion abnormalities.  2. The right ventricle has normal systolic function. The cavity was normal. There is no increase in right ventricular wall thickness.  3. Mild thickening of the aortic valve. Mild calcification of the aortic valve. Mild to moderate aortic annular  calcification noted.  4. The mitral valve is grossly normal. There is mild mitral annular calcification present.  5. The aortic root is normal in size and structure.  6. No intracardiac thrombi or masses were visualized.    CAROTID RIGHT  ICA: 141/15 cm/sec  CCA: 754/36 cm/sec  SYSTOLIC ICA/CCA RATIO:  1.0  ECA: 237 cm/sec  LEFT  ICA: 199/23 cm/sec  CCA: 067/70 cm/sec  SYSTOLIC ICA/CCA RATIO:  1.6  ECA: 311 cm/sec  RIGHT CAROTID ARTERY: There is a moderate to large amount of eccentric echogenic shadowing plaque within the right carotid bulb (image 16), extending to involve the origin and proximal aspects of the right internal carotid artery (image 26), which results in elevated peak systolic velocities within the proximal mid aspects the right internal carotid artery. Greatest acquired peak systolic velocity within mid right ICA measures 141 centimeters/second (image 30).  RIGHT VERTEBRAL ARTERY:  Antegrade Flow  LEFT CAROTID ARTERY: There is a moderate to large amount of eccentric echogenic plaque within the left carotid bulb (image 52), extending to involve the origin and proximal aspects of the left internal carotid artery (image 61), which results in elevated peak systolic velocities throughout the interrogated course the left internal carotid artery. Greatest acquired peak systolic velocity within the mid left ICA measures 199 centimeters/second (image 66).  LEFT VERTEBRAL ARTERY:  Antegrade Flow  IMPRESSION: Moderate to large amount of atherosclerotic plaque, left greater than right, results in elevated peak systolic velocities within the bilateral internal carotid arteries, compatible with the 50-69% luminal narrowing range bilaterally. Further evaluation with CTA could performed as clinically indicated.     Lety Cullens A. Merlene Long, M.D.  Diplomate, Tax adviser of Psychiatry and Neurology ( Neurology). 09/30/2018, 7:48 PM

## 2018-10-01 ENCOUNTER — Observation Stay (HOSPITAL_COMMUNITY): Payer: Medicare HMO

## 2018-10-01 DIAGNOSIS — H539 Unspecified visual disturbance: Secondary | ICD-10-CM | POA: Diagnosis not present

## 2018-10-01 DIAGNOSIS — N183 Chronic kidney disease, stage 3 unspecified: Secondary | ICD-10-CM

## 2018-10-01 DIAGNOSIS — I10 Essential (primary) hypertension: Secondary | ICD-10-CM | POA: Diagnosis not present

## 2018-10-01 DIAGNOSIS — R531 Weakness: Secondary | ICD-10-CM | POA: Diagnosis not present

## 2018-10-01 DIAGNOSIS — H538 Other visual disturbances: Secondary | ICD-10-CM | POA: Diagnosis not present

## 2018-10-01 DIAGNOSIS — E1122 Type 2 diabetes mellitus with diabetic chronic kidney disease: Secondary | ICD-10-CM

## 2018-10-01 DIAGNOSIS — G459 Transient cerebral ischemic attack, unspecified: Secondary | ICD-10-CM | POA: Diagnosis not present

## 2018-10-01 DIAGNOSIS — M542 Cervicalgia: Secondary | ICD-10-CM | POA: Diagnosis not present

## 2018-10-01 DIAGNOSIS — E785 Hyperlipidemia, unspecified: Secondary | ICD-10-CM

## 2018-10-01 DIAGNOSIS — R739 Hyperglycemia, unspecified: Secondary | ICD-10-CM | POA: Diagnosis not present

## 2018-10-01 DIAGNOSIS — E1121 Type 2 diabetes mellitus with diabetic nephropathy: Secondary | ICD-10-CM | POA: Diagnosis not present

## 2018-10-01 MED ORDER — VITAMIN B-12 1000 MCG PO TABS
1000.0000 ug | ORAL_TABLET | Freq: Two times a day (BID) | ORAL | Status: DC
  Administered 2018-10-01: 10:00:00 1000 ug via ORAL
  Filled 2018-10-01: qty 1

## 2018-10-01 MED ORDER — ATORVASTATIN CALCIUM 10 MG PO TABS: 10.0000 mg | ORAL_TABLET | Freq: Every day | ORAL | 3 refills | Status: DC

## 2018-10-01 MED ORDER — ASPIRIN 325 MG PO TABS: 325.0000 mg | ORAL_TABLET | Freq: Every day | ORAL | 3 refills | Status: DC

## 2018-10-01 MED ORDER — CYANOCOBALAMIN 1000 MCG PO TABS: 1000.0000 ug | ORAL_TABLET | Freq: Two times a day (BID) | ORAL | 3 refills | Status: DC

## 2018-10-01 MED ORDER — ATORVASTATIN CALCIUM 10 MG PO TABS: 10.0000 mg | ORAL_TABLET | Freq: Every day | ORAL | Status: DC

## 2018-10-01 NOTE — Plan of Care (Signed)
  Problem: Education: Goal: Knowledge of General Education information will improve Description: Including pain rating scale, medication(s)/side effects and non-pharmacologic comfort measures Outcome: Adequate for Discharge   Problem: Health Behavior/Discharge Planning: Goal: Ability to manage health-related needs will improve Outcome: Adequate for Discharge   Problem: Clinical Measurements: Goal: Ability to maintain clinical measurements within normal limits will improve Outcome: Adequate for Discharge Goal: Will remain free from infection Outcome: Adequate for Discharge Goal: Diagnostic test results will improve Outcome: Adequate for Discharge Goal: Respiratory complications will improve Outcome: Adequate for Discharge Goal: Cardiovascular complication will be avoided Outcome: Adequate for Discharge   Problem: Activity: Goal: Risk for activity intolerance will decrease Outcome: Adequate for Discharge   Problem: Nutrition: Goal: Adequate nutrition will be maintained Outcome: Adequate for Discharge   Problem: Coping: Goal: Level of anxiety will decrease Outcome: Adequate for Discharge   Problem: Elimination: Goal: Will not experience complications related to bowel motility Outcome: Adequate for Discharge Goal: Will not experience complications related to urinary retention Outcome: Adequate for Discharge   Problem: Pain Managment: Goal: General experience of comfort will improve Outcome: Adequate for Discharge   Problem: Safety: Goal: Ability to remain free from injury will improve Outcome: Adequate for Discharge   Problem: Skin Integrity: Goal: Risk for impaired skin integrity will decrease Outcome: Adequate for Discharge   Problem: Education: Goal: Knowledge of secondary prevention will improve Outcome: Adequate for Discharge   Problem: Nutrition: Goal: Dietary intake will improve Outcome: Adequate for Discharge

## 2018-10-01 NOTE — TOC Initial Note (Signed)
Transition of Care [facility]) - Initial/Assessment Note    Patient Details  Name: Paula Long MRN: [id number] Date of Birth: [birth year]  Transition of Care [facility]) CM/SW Contact:    Ihor Gully, LCSW Phone Number: 10/01/2018, 3:08 PM  Clinical Narrative:                 Patient is agreeable to HHPT. At baseline she is independent in ADLs and drives. She reports no issues with transportation nor obtaining medications. Choices provided and referral made to choice requested.   Expected Discharge Plan: [place] Barriers to Discharge: No Barriers Identified   Patient Goals and CMS Choice Patient states their goals for this hospitalization and ongoing recovery are:: To go home and remain independent. CMS Medicare.gov Compare Post Acute Care list provided to:: Patient Choice offered to / list presented to : Patient  Expected Discharge Plan and Services Expected Discharge Plan: [place] In-house Referral: Clinical Social Work   Post Acute Care Choice: [place] arrangements for the past 2 months: [place] Expected Discharge Date: 10/01/18                         HH Arranged: PT HH Agency: [facility] Date [place]: 10/01/18 Time [place]: [id number] Representative spoke with at [place] Arrangements/Services Living arrangements for the past 2 months: [place] with:: Significant Other Patient language and need for interpreter reviewed:: Yes Do you feel safe going back to the place where you live?: Yes      Need for Family Participation in Patient Care: Yes (Comment) Care giver support system in place?: Yes (comment) Current home services: DME Criminal Activity/Legal Involvement Pertinent to Current Situation/Hospitalization: No - Comment as needed  Activities of Daily Living Home Assistive Devices/Equipment: Environmental consultant (specify type), Cane (specify quad  or straight) ADL Screening (condition at time of admission) Patient's cognitive ability adequate to safely complete daily activities?: Yes Is the patient deaf or have difficulty hearing?: No Does the patient have difficulty seeing, even when wearing glasses/contacts?: No Does the patient have difficulty concentrating, remembering, or making decisions?: No Patient able to express need for assistance with ADLs?: Yes Does the patient have difficulty dressing or bathing?: No Independently performs ADLs?: Yes (appropriate for developmental age) Does the patient have difficulty walking or climbing stairs?: No Weakness of Legs: None Weakness of Arms/Hands: None  Permission Sought/Granted                  Emotional Assessment Appearance:: Appears stated age   Affect (typically observed): Accepting, Calm Orientation: : Oriented to Self, Oriented to Place, Oriented to  Time, Oriented to Situation Alcohol / Substance Use: Not Applicable Psych Involvement: No (comment)  Admission diagnosis:  TIA (transient ischemic attack) [G45.9] Weakness [R53.1] Vision changes [H53.9] Patient Active Problem List   Diagnosis Date Noted  . Vision changes   . Weakness   . Diabetes mellitus with nephropathy ([place])   . CKD stage 3 secondary to diabetes ([place])   . Hyperlipidemia   . TIA (transient ischemic attack) 09/29/2018  . Peripheral edema 09/04/2018  . Depression with anxiety 05/22/2014  . Family hx of colon cancer 02/19/2014  . IBS (irritable bowel syndrome) 01/23/2014  . Urinary incontinence, mixed 10/13/2013  . Chronic insomnia 09/12/2013  . Boil 02/27/2013  . Skin lesion of face 10/22/2012  . Hemorrhoids 10/22/2012  .  Intertrigo 06/21/2012  . Neck pain on left side 06/21/2012  . Bunion of great toe 06/21/2012  . OAB (overactive bladder) 03/15/2012  . Spinal stenosis, lumbar region, with neurogenic claudication 12/11/2011  . Essential hypertension 10/30/2011  . Chronic back pain  10/30/2011  . GERD (gastroesophageal reflux disease) 10/30/2011  . COPD (chronic obstructive pulmonary disease) ([place]) 10/30/2011  . Tobacco user 10/30/2011  . Obesity 10/30/2011  . Abnormal mammogram 10/30/2011   PCP:  Alycia Rossetti, MD Pharmacy:   [place], [place] [address] Phone: [phone] Fax: [phone]  [facility] [place], [place] [address] Phone: [phone] Fax: [phone]  [place], [place] [address] Phone: [phone] Fax: [phone]     Social Determinants of Health (SDOH) Interventions    Readmission Risk Interventions No flowsheet data found.

## 2018-10-01 NOTE — Progress Notes (Signed)
IV removed, patient tolerated well. Reviewed AVS with patient who verbalized understanding. Patient transported to short stay lobby via wheelchair and taken home by her boyfriend.

## 2018-10-01 NOTE — Discharge Summary (Signed)
Physician Discharge Summary  Paula Long OZH:[id number] DOB: [birth year] DOA: 09/29/2018  PCP: Alycia Rossetti, MD  Admit date: 09/29/2018 Discharge date: 10/01/2018  Time spent: 35 minutes  Recommendations for Outpatient Follow-up:  1. Repeat basic metabolic panel to follow electrolytes and renal function 2. Reassess blood pressure and further adjust antihypertensive regimen as needed.   Discharge Diagnoses:  Active Problems:   Essential hypertension   Neck pain on left side   TIA (transient ischemic attack)   Vision changes   Weakness   Diabetes mellitus with nephropathy ([place])   CKD stage 3 secondary to diabetes ([place])   Hyperlipidemia   Discharge Condition: Stable and improved.  Patient discharged home with instruction to follow-up with PCP, neurologist and ENT doctor as instructed.  Diet recommendation: Heart healthy and modified carbohydrate diet.  Filed Weights   09/29/18 1048 09/29/18 1511  Weight: 93.9 kg 94.6 kg    History of present illness:  68 y.o. female family history significant for anxiety, hypertension, fibromyalgia, chronic back pain, gastroesophageal reflux disease, tobacco abuse and COPD; who presented to the emergency department secondary to weakness (acute, generalized, eating she expressed left-sided more affected) and bilateral blurry vision (specifying right worse than left).  The symptoms presented suddenly around 8:30 AM in the morning and were associated with sweating and diaphoresis.  Patient expressed that she woke up feeling blue and was preparing breakfast when she first experienced symptoms.  Patient denies chest pain, palpitations, headaches, photophobia, neck rigidity, coughing spells, dysuria, hematuria, melena, hematochezia, sick contacts, fever, chills or any other complaints.  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  Tele-neurology was consulted and recommended no TPA, given mild nondisabling symptoms and the fact that the risk of IV TPA in her case outweigh the potential benefits.  Recommendations were given to an inpatient to the hospital to complete CVA work-up.  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  Patient with chronic left lower extremity neuropathy and weakness from chronic back pain and also having some difficulty on her vision due to cataracts.  She fell back to her baseline and ready to go home. -Patient will be discharged on aspirin and Lipitor as part of secondary prevention and risk factor modification strategy -Continue blood pressure control -Continue modified carbohydrate diet -LDL 54 and A1c 61. -Patient with normal TSH -Will follow-up in 6 weeks with neurology service as an outpatient.  2-essential hypertension -Will continue the use of Cardizem and hydralazine for blood pressure control -Heart healthy diet has been recommended  3-type 2 diabetes with nephropathy -Patient with hyperglycemia on presentation -A1c 6.1 -No medications prior to admission. -Continue modified carbohydrate diet.  4-chronic renal failure stage III at baseline -Most likely in the setting of hypertension and diabetes -Continue to follow renal function as an outpatient -Patient advised to follow low-sodium diet and maintain adequate hydration -At discharge creatinine 1.7.  5-gastroesophageal reflux disease -Continue PPI  6-depression/insomnia  -No hallucinations or suicidal ideation -Continue Lexapro and the use of trazodone.  7-chronic back pain and neuropathy -Continue PRN  analgesics and the use of Neurontin.  8-history of COPD/tobacco  abuse -Cessation counseling has been provided -Continue the use of as needed nebulizer/inhaler regimen as prior to admission -No need of oxygen supplementation, no wheezing and normal respiratory effort.  9-left lymphadenitis/parotid gland cystic lesion -Ultrasound demonstrated concerns for necrotic changes on lymph nodes and potential malignancy.  CT scan was performed and demonstrated 2 x 2 cm cystic lesion extending from the left parotid gland with only chronic changes of the patient's lymph nodes.  Biopsy has been recommended. -Dr. Blenda Nicely [facility]) was consulted over the phone with recommendation for outpatient follow-up -Radiology service also recommended for direct evaluation of patient's pharynx.  Procedures:  See below for x-ray reports  Carotid duplex: Moderate to large amount of atherosclerotic plaque, left greater than right, results in elevated peak systolic velocities within the bilateral internal carotid arteries, compatible with 50 to 69% luminal narrowing range bilaterally.   2D echo: 1. The left ventricle has hyperdynamic systolic function, with an ejection fraction of >65%. The cavity size was normal. There is mildly increased left ventricular wall thickness. Left ventricular diastolic Doppler parameters are consistent with  impaired relaxation. No evidence of left ventricular regional wall motion abnormalities. 2. The right ventricle has normal systolic function. The cavity was normal. There is no increase in right ventricular wall thickness. 3. Mild thickening of the aortic valve. Mild calcification of the aortic valve. Mild to moderate aortic annular calcification noted. 4. The mitral valve is grossly normal. There is mild mitral annular calcification present. 5. The aortic root is normal in size and structure. 6. No intracardiac thrombi or masses were  visualized.  Consultations:  Neurology service  ENT (Dr. Blenda Nicely).  Discharge Exam: Vitals:   10/01/18 0710 10/01/18 1110  BP: (!) 167/77 (!) 157/50  Pulse: 63   Resp: 20 18  Temp: 97.7 F (36.5 C)   SpO2: 92% 99%   General exam: Alert, awake, oriented x 3; no pronator drift, reports improvement in her overall vision seal and numbness in upper extremities.  Reports weakness and neuropathy in left lower extremity unchanged from her baseline.  Patient continue complaining of pain in the left mandible/upper neck area.  Patient is afebrile. Respiratory system: Clear to auscultation. Respiratory effort normal. Cardiovascular system:RRR. No murmurs, rubs, gallops. Gastrointestinal system: Abdomen is nondistended, soft and nontender. No organomegaly or masses felt. Normal bowel sounds heard. Central nervous system: Alert and oriented. No focal neurological deficits. Extremities: No cyanosis or clubbing. Skin: No rashes, no petechiae, no open wounds.  Patient with positive left open neck/mandible swollen nodule tender to palpation. Psychiatry: Judgement and insight appear normal. Mood & affect appropriate.    Discharge Instructions   Discharge Instructions    Diet - low sodium heart healthy   Complete by: As directed    Discharge instructions   Complete by: As directed    Arrange follow-up in 10 days with PCP Follow-up with ENT doctor (Dr. Blenda Nicely) as instructed (office will contact you with appointment details) Follow-up with neurology service (Dr. [name]) in 6 weeks Follow heart healthy and modified carbohydrate diet Maintain adequate hydration Take medications as prescribed.     Allergies as of 10/01/2018      Reactions   Sulfa Antibiotics    Nystatin Rash   Oral rash   Percocet [oxycodone-acetaminophen] Rash   Oral rash   Vesicare [solifenacin] Rash   Vicodin [hydrocodone-acetaminophen] Rash   Oral rash      Medication List    STOP taking these medications    amoxicillin-clavulanate 875-125 MG tablet Commonly  known as: AUGMENTIN   desmopressin 0.1 MG tablet Commonly known as: DDAVP   IBU 600 MG tablet Generic drug: ibuprofen     TAKE these medications   albuterol (2.5 MG/3ML) 0.083% nebulizer solution Commonly known as: PROVENTIL INHALE THE CONTENTS OF 1 VIAL VIA NEBULIZER EVERY 4 HOURS AS NEEDED FOR WHEEZING OR SHORTNESS OF BREATH   albuterol 108 (90 Base) MCG/ACT inhaler Commonly known as: VENTOLIN HFA INHALE 2 PUFFS EVERY 4 HOURS AS NEEDED FOR WHEEZING OR SHORTNESS OF BREATH   aspirin 325 MG tablet Take 1 tablet (325 mg total) by mouth daily. Start taking on: October 02, 2018   atorvastatin 10 MG tablet Commonly known as: LIPITOR Take 1 tablet (10 mg total) by mouth daily at 6 PM.   cyanocobalamin 1000 MCG tablet Take 1 tablet (1,000 mcg total) by mouth 2 (two) times a day.   Dexilant 60 MG capsule Generic drug: dexlansoprazole TAKE 1 CAPSULE EVERY DAY   diltiazem 120 MG tablet Commonly known as: Cardizem Take 1 tablet (120 mg total) by mouth 2 (two) times daily.   EasyMax Test test strip Generic drug: glucose blood USE TO TEST ONCE DAILY.   escitalopram 5 MG tablet Commonly known as: Lexapro Take 1 tablet (5 mg total) by mouth daily.   furosemide 20 MG tablet Commonly known as: LASIX Take 1 tablet (20 mg total) by mouth daily as needed.   gabapentin 400 MG capsule Commonly known as: NEURONTIN TAKE 2 CAPSULES AT BEDTIME   hydrALAZINE 50 MG tablet Commonly known as: APRESOLINE Take 1 tablet (50 mg total) by mouth 3 (three) times daily.   nystatin powder Commonly known as: MYCOSTATIN/NYSTOP APPLY  TOPICALLY TWICE DAILY AS NEEDED   traZODone 150 MG tablet Commonly known as: DESYREL TAKE 1 TABLET AT BEDTIME      Allergies  Allergen Reactions  . Sulfa Antibiotics   . Nystatin Rash    Oral rash  . Percocet [Oxycodone-Acetaminophen] Rash    Oral rash  . Vesicare [Solifenacin] Rash  . Vicodin  [Hydrocodone-Acetaminophen] Rash    Oral rash   Follow-up Information    [place], Modena Nunnery, MD Follow up on 10/08/2018.   Specialty: Family Medicine Why: Please follow up with Dr. Buelah Manis on Tuesday, July 21st at 12:15pm. Contact information: [address] [place] [address] [phone]        Arnoldo Lenis, MD .   Specialty: Cardiology Contact information: [place] [address] [phone]        Phillips Odor, MD. Schedule an appointment as soon as possible for a visit in 6 week(s).   Specialty: Neurology Contact information: [address] [place] [address] [phone]        Helayne Seminole, MD. Call.   Specialty: Otolaryngology Why: Office will contact you with appointment details; if not contact office in 3-5 days. Contact information: [place] [address] [place] [address] [phone]           The results of significant diagnostics from this hospitalization (including imaging, microbiology, ancillary and laboratory) are listed below for reference.    Significant Diagnostic Studies: Ct Soft Tissue Neck W Contrast  Result Date: 09/30/2018 CLINICAL DATA:  Left neck mass solitary afebrile. Abnormal ultrasound today EXAM: CT NECK WITH CONTRAST TECHNIQUE: Multidetector CT imaging of the neck was performed using the standard protocol following the bolus administration of intravenous contrast. CONTRAST:  66mL OMNIPAQUE IOHEXOL 300 MG/ML  SOLN COMPARISON:  Ultrasound neck 09/30/2018 FINDINGS: Pharynx and larynx: Normal. No mass or swelling. Salivary glands: No inflammation, mass, or stone. Cystic mass in the left upper neck appears to be just below the left parotid gland and appears extra parotid. This corresponds to the ultrasound finding. Thyroid: Negative Lymph nodes: Septated cystic lymph node in the left upper neck just below the left parotid gland measuring 2.0 x 2.0 cm. No other enlarged or  abnormal density lymph nodes in the neck. Vascular: Normal vascular enhancement. Atherosclerotic calcification in the carotid bifurcation bilaterally. Limited intracranial: Negative Visualized orbits: Negative Mastoids and visualized paranasal sinuses: Negative Skeleton: No acute skeletal abnormality. Upper chest: Lung apices clear bilaterally. Other: None IMPRESSION: 2 x 2 cm cystic lymph node just below the left parotid gland. Cystic changes in lymph node are concerning for necrosis and could represent malignancy. Lymph node infection considered less likely given the well-defined septations and lack of surrounding inflammation. Biopsy is therefore recommended. No pharyngeal mucosal mass identified. Direct pharyngeal inspection recommended. Electronically Signed   By: Franchot Gallo M.D.   On: 09/30/2018 19:07   Mr Angio Head Wo Contrast  Result Date: 10/01/2018 CLINICAL DATA:  Generalized weakness with blurred vision EXAM: MRA HEAD WITHOUT CONTRAST TECHNIQUE: Angiographic images of the Circle of Willis were obtained using MRA technique without intravenous contrast. COMPARISON:  Brain MRI from yesterday FINDINGS: Carotid and vertebral arteries are smooth and widely patent. No branch occlusion, beading, or aneurysm. Moderate narrowing at the right P2 segment, presumably atheromatous. There is generalized vessel blurring from artifact. IMPRESSION: No emergent finding. 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 Electronically Signed   By: Monte Fantasia M.D.   On: 10/01/2018 08:30   Mr Brain Wo Contrast (neuro Protocol)  Result Date: 09/30/2018 CLINICAL DATA:  Generalized weakness and blurred vision beginning yesterday. EXAM: MRI HEAD WITHOUT CONTRAST TECHNIQUE: Multiplanar, multiecho pulse sequences of the brain and surrounding structures were obtained without intravenous contrast. COMPARISON:  Head CT 09/29/2018 FINDINGS: Brain: Diffusion imaging does not show any acute or subacute infarction.  There are mild chronic small-vessel ischemic changes of the pons. No focal cerebellar insult. 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 Cerebral hemispheres show moderate chronic small-vessel ischemic changes of the deep and subcortical white matter. No cortical or large vessel territory infarction. No mass lesion, hemorrhage, hydrocephalus or extra-axial collection. Vascular: Major vessels at the base of the brain show flow. Skull and upper cervical spine: Negative Sinuses/Orbits: Clear/normal Other: None IMPRESSION: No acute finding by MRI. 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 Chronic small-vessel ischemic changes of the pons and cerebral hemispheric white matter. Electronically Signed   By: Nelson Chimes M.D.   On: 09/30/2018 10:17   US Soft Tissue Head & Neck (non-thyroid)  Result Date: 09/30/2018 CLINICAL DATA:  Tender swollen area of the left neck. EXAM: ULTRASOUND OF HEAD/NECK SOFT TISSUES TECHNIQUE: Ultrasound examination of the head and neck soft tissues was performed in the area of clinical concern. COMPARISON:  None. FINDINGS: At the area of the palpable abnormality, in the left neck at the level 2 nodal station, there is a 2.6 x 1.9 x 2.3 cm ovoid necrotic structure with multiple septations most consistent with a necrotic lymph node. This is a worrisome finding and may indicate an underlying head and neck carcinoma. CT scan of the neck with contrast is suggested. IMPRESSION: In 2.6 x 1.9 x 2.3 cm necrotic level 2 lymph node on the left. This is a worrisome finding and could indicate underlying head  and neck carcinoma. Neck CT with contrast is recommended. Electronically Signed   By: Nelson Chimes M.D.   On: 09/30/2018 13:16   US Carotid Bilateral (at [facility] Only)  Result Date: 09/30/2018 CLINICAL DATA:  TIA. Blurry vision. Syncopal episode. History of hypertension and smoking. EXAM: BILATERAL CAROTID DUPLEX ULTRASOUND TECHNIQUE: Pearline Cables scale imaging, color Doppler and duplex  ultrasound were performed of bilateral carotid and vertebral arteries in the neck. COMPARISON:  None. FINDINGS: Criteria: Quantification of carotid stenosis is based on velocity parameters that correlate the residual internal carotid diameter with NASCET-based stenosis levels, using the diameter of the distal internal carotid lumen as the denominator for stenosis measurement. The following velocity measurements were obtained: RIGHT ICA: 141/15 cm/sec CCA: 222/97 cm/sec SYSTOLIC ICA/CCA RATIO:  1.0 ECA: 237 cm/sec LEFT ICA: 199/23 cm/sec CCA: 989/21 cm/sec SYSTOLIC ICA/CCA RATIO:  1.6 ECA: 311 cm/sec RIGHT CAROTID ARTERY: There is a moderate to large amount of eccentric echogenic shadowing plaque within the right carotid bulb (image 16), extending to involve the origin and proximal aspects of the right internal carotid artery (image 26), which results in elevated peak systolic velocities within the proximal mid aspects the right internal carotid artery. Greatest acquired peak systolic velocity within mid right ICA measures 141 centimeters/second (image 30). RIGHT VERTEBRAL ARTERY:  Antegrade Flow LEFT CAROTID ARTERY: There is a moderate to large amount of eccentric echogenic plaque within the left carotid bulb (image 52), extending to involve the origin and proximal aspects of the left internal carotid artery (image 61), which results in elevated peak systolic velocities throughout the interrogated course the left internal carotid artery. Greatest acquired peak systolic velocity within the mid left ICA measures 199 centimeters/second (image 66). LEFT VERTEBRAL ARTERY:  Antegrade Flow IMPRESSION: Moderate to large amount of atherosclerotic plaque, left greater than right, results in elevated peak systolic velocities within the bilateral internal carotid arteries, compatible with the 50-69% luminal narrowing range bilaterally. Further evaluation with CTA could performed as clinically indicated. Electronically Signed    By: Sandi Mariscal M.D.   On: 09/30/2018 12:10   Ct Head Code Stroke Wo Contrast  Result Date: 09/29/2018 CLINICAL DATA:  Code stroke. Dizziness and generalized weakness since 8 a.m. today. EXAM: CT HEAD WITHOUT CONTRAST TECHNIQUE: Contiguous axial images were obtained from the base of the skull through the vertex without intravenous contrast. COMPARISON:  Report of CT head 01/14/2001. Images are not available. FINDINGS: Brain: Mild generalized atrophy and moderate diffuse subcortical white matter disease is present bilaterally. Basal ganglia are intact. Age indeterminate lacunar infarct of the left thalamus is likely remote. No focal cortical abnormalities are present. The ventricles are of normal size. The brainstem and cerebellum are within normal limits. Vascular: Atherosclerotic calcifications are present within the cavernous internal carotid arteries bilaterally. There is no asymmetric hyperdense vessel. Skull: Calvarium is intact. No focal lytic or blastic lesions are present. Sinuses/Orbits: The paranasal sinuses and mastoid air cells are clear. The globes and orbits are within normal limits. ASPECTS [facility] Stroke Program Early CT Score) - Ganglionic level infarction (caudate, lentiform nuclei, internal capsule, insula, M1-M3 cortex): 7/7 - Supraganglionic infarction (M4-M6 cortex): 3/3 Total score (0-10 with 10 being normal): 10/10 IMPRESSION: 1. Mild generalized atrophy and moderate diffuse white matter disease without acute cortical infarct. 2. 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 Given the other white matter disease. This is likely remote. 3. Atherosclerosis.  No hyperdense vessel. 4. ASPECTS is 10/10 These results were called by telephone at  the time of interpretation on 09/29/2018 at 11:47 am to Dr. Nanda Quinton , who verbally acknowledged these results. Electronically Signed   By: San Morelle M.D.   On: 09/29/2018 11:47    Microbiology: Recent Results  (from the past 240 hour(s))  SARS Coronavirus 2 (CEPHEID - Performed in [place] hospital lab), Hosp Order     Status: None   Collection Time: 09/29/18 12:28 PM   Specimen: Nasopharyngeal Swab  Result Value Ref Range Status   SARS Coronavirus 2 NEGATIVE NEGATIVE Final    Comment: (NOTE) If result is NEGATIVE SARS-CoV-2 target nucleic acids are NOT DETECTED. The SARS-CoV-2 RNA is generally detectable in upper and lower  respiratory specimens during the acute phase of infection. The lowest  concentration of SARS-CoV-2 viral copies this assay can detect is 250  copies / mL. A negative result does not preclude SARS-CoV-2 infection  and should not be used as the sole basis for treatment or other  patient management decisions.  A negative result may occur with  improper specimen collection / handling, submission of specimen other  than nasopharyngeal swab, presence of viral mutation(s) within the  areas targeted by this assay, and inadequate number of viral copies  (<250 copies / mL). A negative result must be combined with clinical  observations, patient history, and epidemiological information. If result is POSITIVE SARS-CoV-2 target nucleic acids are DETECTED. The SARS-CoV-2 RNA is generally detectable in upper and lower  respiratory specimens dur ing the acute phase of infection.  Positive  results are indicative of active infection with SARS-CoV-2.  Clinical  correlation with patient history and other diagnostic information is  necessary to determine patient infection status.  Positive results do  not rule out bacterial infection or co-infection with other viruses. If result is PRESUMPTIVE POSTIVE SARS-CoV-2 nucleic acids MAY BE PRESENT.   A presumptive positive result was obtained on the submitted specimen  and confirmed on repeat testing.  While 2019 novel coronavirus  (SARS-CoV-2) nucleic acids may be present in the submitted sample  additional confirmatory testing may be  necessary for epidemiological  and / or clinical management purposes  to differentiate between  SARS-CoV-2 and other Sarbecovirus currently known to infect humans.  If clinically indicated additional testing with an alternate test  methodology [phone]) is advised. The SARS-CoV-2 RNA is generally  detectable in upper and lower respiratory sp ecimens during the acute  phase of infection. The expected result is Negative. Fact Sheet for Patients:  StrictlyIdeas.no Fact Sheet for Healthcare Providers: BankingDealers.co.za This test is not yet approved or cleared by the [place] FDA and has been authorized for detection and/or diagnosis of SARS-CoV-2 by FDA under an Emergency Use Authorization (EUA).  This EUA will remain in effect (meaning this test can be used) for the duration of the COVID-19 declaration under Section 564(b)(1) of the Act, 21 U.S.C. section 360bbb-3(b)(1), unless the authorization is terminated or revoked sooner. Performed at [facility], [address] [place], [place] [address]      Labs: Basic Metabolic Panel: Recent Labs  Lab 09/29/18 1116 09/29/18 1143  NA 130* 129*  K 4.2 4.2  CL 96* 95*  CO2 24  --   GLUCOSE 127* 122*  BUN 25* 24*  CREATININE 1.63* 1.70*  CALCIUM 8.9  --    Liver Function Tests: Recent Labs  Lab 09/29/18 1116  AST 15  ALT 14  ALKPHOS 73  BILITOT 0.5  PROT 7.5  ALBUMIN 4.1   CBC: Recent Labs  Lab 09/29/18 1116 09/29/18 1143  WBC 8.9  --   NEUTROABS 7.2  --   HGB 13.5 15.3*  HCT 40.9 45.0  MCV 92.1  --   PLT 243  --     CBG: Recent Labs  Lab 09/29/18 1139  GLUCAP 121*    Signed:  Barton Dubois MD.  [facility] 10/01/2018, 1:19 PM

## 2018-10-02 ENCOUNTER — Telehealth: Payer: Self-pay | Admitting: Cardiology

## 2018-10-02 ENCOUNTER — Telehealth: Payer: Self-pay | Admitting: *Deleted

## 2018-10-02 NOTE — Telephone Encounter (Signed)
Left detailed message.   

## 2018-10-02 NOTE — Telephone Encounter (Signed)
Patient was told to call and let us she is out of hospital and to tell us what they said

## 2018-10-02 NOTE — Telephone Encounter (Signed)
Ok to keep our same follow up, based on her admission the most important f/u is with her pcp   Paula Abts MD

## 2018-10-02 NOTE — Telephone Encounter (Signed)
noted 

## 2018-10-02 NOTE — Telephone Encounter (Signed)
Received call from [place], [place] with [facility] [phone]~ telephone.   Reports that patient has been discharged form hospital with PT orders. Patient has deferred start of care until 10/03/2018.  MD to be made aware.

## 2018-10-02 NOTE — Telephone Encounter (Signed)
Pt went to [facility] for BP/nausea (thought she was having a stoke) pcp this Monday and see nephrologist on Tuesday - d/c summary pt was started on ASA 325 mg daily - changed hydralazine 50 mg tid - wanted to make Dr Harl Bowie aware and to see if she needed to be seen sooner than November

## 2018-10-03 DIAGNOSIS — E1122 Type 2 diabetes mellitus with diabetic chronic kidney disease: Secondary | ICD-10-CM | POA: Diagnosis not present

## 2018-10-03 DIAGNOSIS — M5442 Lumbago with sciatica, left side: Secondary | ICD-10-CM | POA: Diagnosis not present

## 2018-10-03 DIAGNOSIS — N183 Chronic kidney disease, stage 3 (moderate): Secondary | ICD-10-CM | POA: Diagnosis not present

## 2018-10-03 DIAGNOSIS — J449 Chronic obstructive pulmonary disease, unspecified: Secondary | ICD-10-CM | POA: Diagnosis not present

## 2018-10-03 DIAGNOSIS — F17210 Nicotine dependence, cigarettes, uncomplicated: Secondary | ICD-10-CM | POA: Diagnosis not present

## 2018-10-03 DIAGNOSIS — G459 Transient cerebral ischemic attack, unspecified: Secondary | ICD-10-CM | POA: Diagnosis not present

## 2018-10-03 DIAGNOSIS — I10 Essential (primary) hypertension: Secondary | ICD-10-CM | POA: Diagnosis not present

## 2018-10-03 DIAGNOSIS — R2681 Unsteadiness on feet: Secondary | ICD-10-CM | POA: Diagnosis not present

## 2018-10-07 ENCOUNTER — Telehealth: Payer: Self-pay | Admitting: *Deleted

## 2018-10-07 ENCOUNTER — Other Ambulatory Visit: Payer: Self-pay

## 2018-10-07 DIAGNOSIS — R221 Localized swelling, mass and lump, neck: Secondary | ICD-10-CM | POA: Diagnosis not present

## 2018-10-07 NOTE — Telephone Encounter (Signed)
Agree with below. Addendum:    I believe that she has limits with moving and including, toileting, bathing, feeding, dressing and grooming. I believe the power wheelchair is needed for pt to be able to perform ADL's in her home

## 2018-10-07 NOTE — Telephone Encounter (Signed)
Received call from Trilla, [facility] PT with [facility]. [phone]~ telephone.   Requested VO to extend [facility] PT services 2x weekly x2 weeks, then 1x weekly x1 week for weakness S/P hospitalization. Also requested OV for SN for medication management and HTN education.   VO given.

## 2018-10-08 ENCOUNTER — Other Ambulatory Visit: Payer: Self-pay

## 2018-10-08 ENCOUNTER — Encounter: Payer: Self-pay | Admitting: Family Medicine

## 2018-10-08 ENCOUNTER — Ambulatory Visit (INDEPENDENT_AMBULATORY_CARE_PROVIDER_SITE_OTHER): Payer: Medicare HMO | Admitting: Family Medicine

## 2018-10-08 VITALS — BP 146/64 | HR 88 | Temp 97.9°F | Resp 18 | Ht 65.0 in | Wt 212.0 lb

## 2018-10-08 DIAGNOSIS — E1121 Type 2 diabetes mellitus with diabetic nephropathy: Secondary | ICD-10-CM | POA: Diagnosis not present

## 2018-10-08 DIAGNOSIS — N183 Chronic kidney disease, stage 3 unspecified: Secondary | ICD-10-CM

## 2018-10-08 DIAGNOSIS — I6523 Occlusion and stenosis of bilateral carotid arteries: Secondary | ICD-10-CM | POA: Diagnosis not present

## 2018-10-08 DIAGNOSIS — E1122 Type 2 diabetes mellitus with diabetic chronic kidney disease: Secondary | ICD-10-CM | POA: Diagnosis not present

## 2018-10-08 DIAGNOSIS — G459 Transient cerebral ischemic attack, unspecified: Secondary | ICD-10-CM | POA: Diagnosis not present

## 2018-10-08 DIAGNOSIS — E7800 Pure hypercholesterolemia, unspecified: Secondary | ICD-10-CM

## 2018-10-08 DIAGNOSIS — I779 Disorder of arteries and arterioles, unspecified: Secondary | ICD-10-CM | POA: Insufficient documentation

## 2018-10-08 MED ORDER — GABAPENTIN 400 MG PO CAPS: 400.0000 mg | ORAL_CAPSULE | Freq: Every day | ORAL | 3 refills | Status: DC

## 2018-10-08 NOTE — Patient Instructions (Addendum)
Stop ibuprofen Keep off the DDVAP bladder pill  Take lasix 20mg  daily for 3 days We will call with lab results  Change F/U to 6 weeks

## 2018-10-08 NOTE — Progress Notes (Signed)
Subjective:    Patient ID: Paula Long, female    DOB: Mar 27, 1950, 68 y.o.   MRN: [id number]  Patient presents for Hospital F/U (TIA)   Pt here for hospital follow up for TIA  Relevant portions of hsital discharge below   Weight up 5lbs since she came from hospital, states she is drinking water feels that she has more fluid on her legs.  She had biopsy of lymph node yeterday by [facility] ENT CT scan was concerning for possible malignancy   Carotid artery disease with occlusion of plaque.  She is now on Lipitor and aspirin she has follow-up with neurology on August 25.  She has chronic pain but does not want to go back to pain management with her neurologist she is off of the ibuprofen in the setting of acute on chronic renal failure.  To have her metabolic panel rechecked today  Pretension she is taking all of her blood pressure medicines as prescribed.  She has her blood pressure things on a calendar.  Her systolic is been anywhere from 140-180/66-84   She has not taken lasix recently  History of present illness:  68 y.o.femalefamily history significant for anxiety, hypertension, fibromyalgia, chronic back pain, gastroesophageal reflux disease,tobacco abuse and COPD; who presented to the emergency department secondary to weakness (acute, generalized, eating she expressed left-sided more affected) and bilateral blurry vision (specifying right worse than left). The symptoms presented suddenly around 8:30 AM in the morning and were associated with sweating and diaphoresis.Patient expressed that she woke up feeling blue and was preparing breakfast when she first experienced symptoms.Patient denies chest pain, palpitations, headaches, photophobia, neck rigidity, coughing spells, dysuria, hematuria, melena, hematochezia, sick contacts, fever, chills or any other complaints.  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 Recommendations were given to an inpatient to the hospital to completeCVA work-up.  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  Patient with chronic left lower extremity neuropathy and weakness from chronic back pain and also having some difficulty on her vision due to cataracts.  She fell back to her baseline and ready to go home. -Patient will be discharged on aspirin and Lipitor as part of secondary prevention and risk factor modification strategy -Continue blood pressure control -Continue modified carbohydrate diet -LDL 54 and A1c 61. -Patient with normal TSH -Will follow-up in 6 weeks with neurology service as an outpatient.  2-essential hypertension -Will continue the use of Cardizem and hydralazine for blood pressure control -Heart healthy diet has been recommended  3-type 2 diabetes with nephropathy -Patient with hyperglycemia on presentation -A1c 6.1 -No medications prior to admission. -Continue modified carbohydrate diet.  4-chronic renal failure stage III at baseline -Most likely in the setting of hypertension and diabetes -Continue to follow renal function as an outpatient -Patient advised to follow low-sodium diet and maintain adequate hydration -At discharge creatinine 1.7.  5-gastroesophageal reflux disease -Continue PPI  6-depression/insomnia  -No hallucinations or suicidal ideation -Continue Lexapro and the  use of  trazodone.  7-chronic back pain and neuropathy -Continue PRN analgesics and the use of Neurontin.  8-history of COPD/tobacco abuse -Cessation counseling has been provided -Continue the use of as needed nebulizer/inhaler regimen as prior to admission -No need of oxygen supplementation, no wheezing and normal respiratory effort.  9-left lymphadenitis/parotid gland cystic lesion -Ultrasound demonstrated concerns for necrotic changes on lymph nodes and potential malignancy.  CT scan was performed and demonstrated 2 x 2 cm cystic lesion extending from the left parotid gland with only chronic changes of the patient's lymph nodes.  Biopsy has been recommended. -Dr. Blenda Nicely [facility] ENT) was consulted over the phone with recommendation for outpatient follow-up -Radiology service also recommended for direct evaluation of patient's pharynx.  Procedures:  See below for x-ray reports  Carotid duplex:Moderate to large amount of atherosclerotic plaque, left greater than right, results in elevated peak systolic velocities within the bilateral internal carotid arteries, compatible with 50 to 69% luminal narrowing range bilaterally.   2D echo: 1. The left ventricle has hyperdynamic systolic function, with an ejection fraction of >65%. The cavity size was normal. There is mildly increased left ventricular wall thickness. Left ventricular diastolic Doppler parameters are consistent with  impaired relaxation. No evidence of left ventricular regional wall motion abnormalities. 2. The right ventricle has normal systolic function. The cavity was normal. There is no increase in right ventricular wall thickness. 3. Mild thickening of the aortic valve. Mild calcification of the aortic valve. Mild to moderate aortic annular calcification noted. 4. The mitral valve is grossly normal. There is mild mitral annular calcification present. 5. The aortic root is normal in size and structure. 6. No  intracardiac thrombi or masses were visualized.  Consultations:  Neurology service  ENT (Dr. Blenda Nicely).  Review Of Systems:  GEN- denies fatigue, fever, weight loss,weakness, recent illness HEENT- denies eye drainage, change in vision, nasal discharge, CVS- denies chest pain, palpitations RESP- denies SOB, cough, wheeze ABD- denies N/V, change in stools, abd pain GU- denies dysuria, hematuria, dribbling, incontinence MSK-+ joint pain, muscle aches, injury Neuro- denies headache, dizziness, syncope, seizure activity        Objective:    BP (!) 146/64   Pulse 88   Temp 97.9 F (36.6 C) (Oral)   Resp 18   Ht 5\' 5"  (1.651 m)   Wt 212 lb (96.2 kg)   SpO2 94%   BMI 35.28 kg/m  GEN- NAD, alert and oriented x3 HEENT- PERRL, EOMI, non injected sclera, pink conjunctiva, MMM, oropharynx clear Neck- Supple, no thyromegaly CVS- RRR, no murmur CVS- RRR, systolic murmur RESP-CTAB Psych- normal affect and mood NEURO-CNII-XII in tact, no focal deficits  EXT- 1+  Edema bilat LE Pulses- Radial, DP- palpated         Assessment & Plan:      Problem List Items Addressed This Visit      Unprioritized   Carotid artery disease ([place])   CKD stage 3 secondary to diabetes ([place])    Mild acute on chronic renal failure we will recheck her renal function today.  She is fluid overloaded so we will go ahead and have her take Lasix 40 mg for 3 days and elevate the feet.  Is likely due to the extra IV fluids given and immobility      Relevant Orders   Basic metabolic panel   Diabetes mellitus with nephropathy ([place])    Discussed dietary changes she has been eating too many sweets and simple carbs.  No change  in her medication.  Goal is A1c less than 7%.      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  Recommend she continue the aspirin  and Lipitor.  She will follow-up with neurology next month.  She does not have any significant deficits from the event.  She would benefit from physical therapy for overall deconditioning in the setting of her chronic back pain and other ailments.         Note: This dictation was prepared with Dragon dictation along with smaller phrase technology. Any transcriptional errors that result from this process are unintentional.

## 2018-10-08 NOTE — Assessment & Plan Note (Addendum)
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  Recommend she continue the aspirin and Lipitor.  She will follow-up with neurology next month.  She does not have any significant deficits from the event.  She would benefit from physical therapy for overall deconditioning in the setting of her chronic back pain and other ailments.

## 2018-10-08 NOTE — Assessment & Plan Note (Signed)
Mild acute on chronic renal failure we will recheck her renal function today.  She is fluid overloaded so we will go ahead and have her take Lasix 40 mg for 3 days and elevate the feet.  Is likely due to the extra IV fluids given and immobility

## 2018-10-08 NOTE — Assessment & Plan Note (Signed)
Discussed dietary changes she has been eating too many sweets and simple carbs.  No change in her medication.  Goal is A1c less than 7%.

## 2018-10-09 DIAGNOSIS — J449 Chronic obstructive pulmonary disease, unspecified: Secondary | ICD-10-CM | POA: Diagnosis not present

## 2018-10-09 DIAGNOSIS — F17210 Nicotine dependence, cigarettes, uncomplicated: Secondary | ICD-10-CM | POA: Diagnosis not present

## 2018-10-09 DIAGNOSIS — R2681 Unsteadiness on feet: Secondary | ICD-10-CM | POA: Diagnosis not present

## 2018-10-09 DIAGNOSIS — I10 Essential (primary) hypertension: Secondary | ICD-10-CM | POA: Diagnosis not present

## 2018-10-09 DIAGNOSIS — M5442 Lumbago with sciatica, left side: Secondary | ICD-10-CM | POA: Diagnosis not present

## 2018-10-09 DIAGNOSIS — N183 Chronic kidney disease, stage 3 (moderate): Secondary | ICD-10-CM | POA: Diagnosis not present

## 2018-10-09 DIAGNOSIS — E1122 Type 2 diabetes mellitus with diabetic chronic kidney disease: Secondary | ICD-10-CM | POA: Diagnosis not present

## 2018-10-09 DIAGNOSIS — G459 Transient cerebral ischemic attack, unspecified: Secondary | ICD-10-CM | POA: Diagnosis not present

## 2018-10-09 LAB — BASIC METABOLIC PANEL
BUN/Creatinine Ratio: 17 (calc) (ref 6–22)
BUN: 19 mg/dL (ref 7–25)
CO2: 27 mmol/L (ref 20–32)
Calcium: 8.9 mg/dL (ref 8.6–10.4)
Chloride: 95 mmol/L — ABNORMAL LOW (ref 98–110)
Creat: 1.11 mg/dL — ABNORMAL HIGH (ref 0.50–0.99)
Glucose, Bld: 95 mg/dL (ref 65–99)
Potassium: 4.6 mmol/L (ref 3.5–5.3)
Sodium: 129 mmol/L — ABNORMAL LOW (ref 135–146)

## 2018-10-10 ENCOUNTER — Telehealth: Payer: Self-pay | Admitting: *Deleted

## 2018-10-10 DIAGNOSIS — F17210 Nicotine dependence, cigarettes, uncomplicated: Secondary | ICD-10-CM | POA: Diagnosis not present

## 2018-10-10 DIAGNOSIS — J449 Chronic obstructive pulmonary disease, unspecified: Secondary | ICD-10-CM | POA: Diagnosis not present

## 2018-10-10 DIAGNOSIS — E1122 Type 2 diabetes mellitus with diabetic chronic kidney disease: Secondary | ICD-10-CM | POA: Diagnosis not present

## 2018-10-10 DIAGNOSIS — I10 Essential (primary) hypertension: Secondary | ICD-10-CM | POA: Diagnosis not present

## 2018-10-10 DIAGNOSIS — M5442 Lumbago with sciatica, left side: Secondary | ICD-10-CM | POA: Diagnosis not present

## 2018-10-10 DIAGNOSIS — G459 Transient cerebral ischemic attack, unspecified: Secondary | ICD-10-CM | POA: Diagnosis not present

## 2018-10-10 DIAGNOSIS — R2681 Unsteadiness on feet: Secondary | ICD-10-CM | POA: Diagnosis not present

## 2018-10-10 DIAGNOSIS — N183 Chronic kidney disease, stage 3 (moderate): Secondary | ICD-10-CM | POA: Diagnosis not present

## 2018-10-10 NOTE — Telephone Encounter (Signed)
Yes - she can restart

## 2018-10-10 NOTE — Telephone Encounter (Signed)
Received call from patient.   Inquired that since her kidney function labs are improving, could she resume Desmopressin.   MD please advise.

## 2018-10-10 NOTE — Telephone Encounter (Signed)
Call placed to patient and patient made aware.  

## 2018-10-14 DIAGNOSIS — R221 Localized swelling, mass and lump, neck: Secondary | ICD-10-CM | POA: Diagnosis not present

## 2018-10-15 ENCOUNTER — Other Ambulatory Visit: Payer: Self-pay | Admitting: Otolaryngology

## 2018-10-15 ENCOUNTER — Other Ambulatory Visit (HOSPITAL_COMMUNITY): Payer: Self-pay | Admitting: Otolaryngology

## 2018-10-15 DIAGNOSIS — R2681 Unsteadiness on feet: Secondary | ICD-10-CM | POA: Diagnosis not present

## 2018-10-15 DIAGNOSIS — J449 Chronic obstructive pulmonary disease, unspecified: Secondary | ICD-10-CM | POA: Diagnosis not present

## 2018-10-15 DIAGNOSIS — N183 Chronic kidney disease, stage 3 (moderate): Secondary | ICD-10-CM | POA: Diagnosis not present

## 2018-10-15 DIAGNOSIS — E1122 Type 2 diabetes mellitus with diabetic chronic kidney disease: Secondary | ICD-10-CM | POA: Diagnosis not present

## 2018-10-15 DIAGNOSIS — F17210 Nicotine dependence, cigarettes, uncomplicated: Secondary | ICD-10-CM | POA: Diagnosis not present

## 2018-10-15 DIAGNOSIS — R221 Localized swelling, mass and lump, neck: Secondary | ICD-10-CM

## 2018-10-15 DIAGNOSIS — M5442 Lumbago with sciatica, left side: Secondary | ICD-10-CM | POA: Diagnosis not present

## 2018-10-15 DIAGNOSIS — I10 Essential (primary) hypertension: Secondary | ICD-10-CM | POA: Diagnosis not present

## 2018-10-15 DIAGNOSIS — G459 Transient cerebral ischemic attack, unspecified: Secondary | ICD-10-CM | POA: Diagnosis not present

## 2018-10-16 DIAGNOSIS — N183 Chronic kidney disease, stage 3 (moderate): Secondary | ICD-10-CM | POA: Diagnosis not present

## 2018-10-16 DIAGNOSIS — J449 Chronic obstructive pulmonary disease, unspecified: Secondary | ICD-10-CM | POA: Diagnosis not present

## 2018-10-16 DIAGNOSIS — R2681 Unsteadiness on feet: Secondary | ICD-10-CM | POA: Diagnosis not present

## 2018-10-16 DIAGNOSIS — E1122 Type 2 diabetes mellitus with diabetic chronic kidney disease: Secondary | ICD-10-CM | POA: Diagnosis not present

## 2018-10-16 DIAGNOSIS — M5442 Lumbago with sciatica, left side: Secondary | ICD-10-CM | POA: Diagnosis not present

## 2018-10-16 DIAGNOSIS — I10 Essential (primary) hypertension: Secondary | ICD-10-CM | POA: Diagnosis not present

## 2018-10-16 DIAGNOSIS — F17210 Nicotine dependence, cigarettes, uncomplicated: Secondary | ICD-10-CM | POA: Diagnosis not present

## 2018-10-16 DIAGNOSIS — G459 Transient cerebral ischemic attack, unspecified: Secondary | ICD-10-CM | POA: Diagnosis not present

## 2018-10-18 ENCOUNTER — Other Ambulatory Visit: Payer: Self-pay | Admitting: Student

## 2018-10-18 ENCOUNTER — Ambulatory Visit: Payer: Medicare HMO | Admitting: Family Medicine

## 2018-10-21 ENCOUNTER — Other Ambulatory Visit: Payer: Self-pay

## 2018-10-21 ENCOUNTER — Encounter (HOSPITAL_COMMUNITY): Payer: Self-pay

## 2018-10-21 ENCOUNTER — Ambulatory Visit (HOSPITAL_COMMUNITY)
Admission: RE | Admit: 2018-10-21 | Discharge: 2018-10-21 | Disposition: A | Discharge status: Home / Self Care | Payer: Medicare HMO | Source: Ambulatory Visit | Attending: Otolaryngology | Admitting: Otolaryngology

## 2018-10-21 DIAGNOSIS — Z7982 Long term (current) use of aspirin: Secondary | ICD-10-CM | POA: Insufficient documentation

## 2018-10-21 DIAGNOSIS — Z79899 Other long term (current) drug therapy: Secondary | ICD-10-CM | POA: Diagnosis not present

## 2018-10-21 DIAGNOSIS — M797 Fibromyalgia: Secondary | ICD-10-CM | POA: Insufficient documentation

## 2018-10-21 DIAGNOSIS — Z882 Allergy status to sulfonamides status: Secondary | ICD-10-CM | POA: Diagnosis not present

## 2018-10-21 DIAGNOSIS — Z888 Allergy status to other drugs, medicaments and biological substances status: Secondary | ICD-10-CM | POA: Diagnosis not present

## 2018-10-21 DIAGNOSIS — F419 Anxiety disorder, unspecified: Secondary | ICD-10-CM | POA: Insufficient documentation

## 2018-10-21 DIAGNOSIS — Z85820 Personal history of malignant melanoma of skin: Secondary | ICD-10-CM | POA: Insufficient documentation

## 2018-10-21 DIAGNOSIS — K219 Gastro-esophageal reflux disease without esophagitis: Secondary | ICD-10-CM | POA: Diagnosis not present

## 2018-10-21 DIAGNOSIS — Z885 Allergy status to narcotic agent status: Secondary | ICD-10-CM | POA: Insufficient documentation

## 2018-10-21 DIAGNOSIS — M1990 Unspecified osteoarthritis, unspecified site: Secondary | ICD-10-CM | POA: Insufficient documentation

## 2018-10-21 DIAGNOSIS — R221 Localized swelling, mass and lump, neck: Secondary | ICD-10-CM | POA: Diagnosis not present

## 2018-10-21 DIAGNOSIS — J449 Chronic obstructive pulmonary disease, unspecified: Secondary | ICD-10-CM | POA: Insufficient documentation

## 2018-10-21 LAB — CBC
HCT: 37.3 % (ref 36.0–46.0)
Hemoglobin: 12.3 g/dL (ref 12.0–15.0)
MCH: 30.7 pg (ref 26.0–34.0)
MCHC: 33 g/dL (ref 30.0–36.0)
MCV: 93 fL (ref 80.0–100.0)
Platelets: 288 10*3/uL (ref 150–400)
RBC: 4.01 MIL/uL (ref 3.87–5.11)
RDW: 14.6 % (ref 11.5–15.5)
WBC: 7.5 10*3/uL (ref 4.0–10.5)
nRBC: 0 % (ref 0.0–0.2)

## 2018-10-21 LAB — PROTIME-INR
INR: 1 (ref 0.8–1.2)
Prothrombin Time: 13.4 seconds (ref 11.4–15.2)

## 2018-10-21 MED ORDER — MIDAZOLAM HCL 2 MG/2ML IJ SOLN
INTRAMUSCULAR | Status: AC
  Filled 2018-10-21: qty 2

## 2018-10-21 MED ORDER — LIDOCAINE HCL (PF) 1 % IJ SOLN
INTRAMUSCULAR | Status: AC
  Filled 2018-10-21: qty 30

## 2018-10-21 MED ORDER — FENTANYL CITRATE (PF) 100 MCG/2ML IJ SOLN
INTRAMUSCULAR | Status: AC
  Filled 2018-10-21: qty 2

## 2018-10-21 MED ORDER — MIDAZOLAM HCL 2 MG/2ML IJ SOLN
INTRAMUSCULAR | Status: AC | PRN
  Administered 2018-10-21 (×2): 1 mg via INTRAVENOUS

## 2018-10-21 MED ORDER — SODIUM CHLORIDE 0.9 % IV SOLN: INTRAVENOUS | Status: DC

## 2018-10-21 MED ORDER — FENTANYL CITRATE (PF) 100 MCG/2ML IJ SOLN
INTRAMUSCULAR | Status: AC | PRN
  Administered 2018-10-21 (×2): 50 ug via INTRAVENOUS

## 2018-10-21 NOTE — Procedures (Signed)
Interventional Radiology Procedure Note  Procedure: US guided left neck cystic mass FNA.   Findings: Predominately cystic lesion of the left neck. Suspicious for pathologic node.   Complications: None  Recommendations:  - Ok to shower tomorrow - Do not submerge for 7 days - Routine wound care   Signed,  Dulcy Fanny. Earleen Newport, DO

## 2018-10-21 NOTE — Discharge Instructions (Signed)

## 2018-10-21 NOTE — H&P (Signed)
Chief Complaint: Patient was seen in consultation today for left neck mass biopsy at the request of Helayne Seminole  Referring Physician(s): Helayne Seminole  Supervising Physician: Corrie Mckusick  Patient Status: [facility] - Out-pt  History of Present Illness: Paula Long is a 68 y.o. female   Known left neck mass Pt noted several weeks ago FNA performed in office late July-- path: negative per notes  Pt still complaining of pain Mass persists May be slightly larger  MD asking for biopsy in IR  CT 09/30/18: IMPRESSION: 2 x 2 cm cystic lymph node just below the left parotid gland. Cystic changes in lymph node are concerning for necrosis and could represent malignancy. Lymph node infection considered less likely given the well-defined septations and lack of surrounding inflammation. Biopsy is therefore recommended.  Scheduled now for bx  Past Medical History:  Diagnosis Date   Anxiety    Arthritis 12-07-11   arthritis,DDD,spinal stenosis   Chronic back pain    COPD (chronic obstructive pulmonary disease) ([place]) 12-07-11   pt. uses nebulizer as needed and inhaler daily   Fibromyalgia    skin cancer   GERD (gastroesophageal reflux disease)    Hemorrhoids    Hypertension 12-07-11   presently no meds   Normal cardiac stress test 06/2009   Shortness of breath 12-07-11   less now, occ wheezes   Skin cancer    melanoma.  face 2014    Past Surgical History:  Procedure Laterality Date   ABDOMINAL HYSTERECTOMY     BACK SURGERY  12-07-11   2'10 L5 x2   COLONOSCOPY WITH PROPOFOL N/A 06/03/2014   Procedure: COLONOSCOPY WITH PROPOFOL; IN CECUM AT 0750; TOTAL WITHDRAWAL TIME 16 MINUTES;  Surgeon: Rogene Houston, MD;  Location: [facility];  Service: Endoscopy;  Laterality: N/A;   left ankle surgery     tendonitis   LUMBAR LAMINECTOMY/DECOMPRESSION MICRODISCECTOMY  12/11/2011   Procedure: LUMBAR LAMINECTOMY/DECOMPRESSION MICRODISCECTOMY;  Surgeon: Tobi Bastos, MD;  Location: [facility];  Service: Orthopedics;  Laterality: Left;  Decompressive Lumbar Laminectomy L5-S1 on Left   POLYPECTOMY N/A 06/03/2014   Procedure: POLYPECTOMY;  Surgeon: Rogene Houston, MD;  Location: [facility];  Service: Endoscopy;  Laterality: N/A;   right wrist surgery for pinched nerve     trigger finger surgery  12-07-11   rt. middle trigger finger release    Allergies: Sulfa antibiotics, Nystatin, Percocet [oxycodone-acetaminophen], Vesicare [solifenacin], and Vicodin [hydrocodone-acetaminophen]  Medications: Prior to Admission medications   Medication Sig Start Date End Date Taking? Authorizing Provider  albuterol (PROVENTIL) (2.5 MG/3ML) 0.083% nebulizer solution INHALE THE CONTENTS OF 1 VIAL VIA NEBULIZER EVERY 4 HOURS AS NEEDED FOR WHEEZING OR SHORTNESS OF BREATH Patient taking differently: Take 2.5 mg by nebulization every 4 (four) hours as needed for wheezing or shortness of breath.  09/18/16  Yes [place], Modena Nunnery, MD  albuterol (VENTOLIN HFA) 108 (90 Base) MCG/ACT inhaler INHALE 2 PUFFS EVERY 4 HOURS AS NEEDED FOR WHEEZING OR SHORTNESS OF BREATH Patient taking differently: Inhale 2 puffs into the lungs every 4 (four) hours as needed for wheezing or shortness of breath.  09/23/18  Yes [place], Modena Nunnery, MD  aspirin 325 MG tablet Take 1 tablet (325 mg total) by mouth daily. 10/02/18  Yes Barton Dubois, MD  atorvastatin (LIPITOR) 10 MG tablet Take 1 tablet (10 mg total) by mouth daily at 6 PM. 10/01/18  Yes Barton Dubois, MD  DEXILANT 60 MG capsule TAKE 1 CAPSULE EVERY DAY 07/09/18  Yes [place], Modena Nunnery, MD  diltiazem (CARDIZEM) 120 MG tablet Take 1 tablet (120 mg total) by mouth 2 (two) times daily. 09/27/18  Yes BranchAlphonse Guild, MD  escitalopram (LEXAPRO) 5 MG tablet Take 1 tablet (5 mg total) by mouth daily. 09/16/18  Yes [place], Modena Nunnery, MD  furosemide (LASIX) 20 MG tablet Take 1 tablet (20 mg total) by mouth daily as needed. 09/04/18  Yes [place], Modena Nunnery, MD    gabapentin (NEURONTIN) 400 MG capsule Take 1 capsule (400 mg total) by mouth at bedtime. 10/08/18  Yes [place], Modena Nunnery, MD  hydrALAZINE (APRESOLINE) 50 MG tablet Take 1 tablet (50 mg total) by mouth 3 (three) times daily. 09/27/18  Yes [place], Alphonse Guild, MD  nystatin (MYCOSTATIN/NYSTOP) powder APPLY  TOPICALLY TWICE DAILY AS NEEDED Patient taking differently: Apply 1 Bottle topically 2 (two) times daily as needed (irritation). APPLY  TOPICALLY TWICE DAILY AS NEEDED 07/29/18  Yes [place], Modena Nunnery, MD  traZODone (DESYREL) 150 MG tablet TAKE 1 TABLET AT BEDTIME 08/28/18  Yes [place], Modena Nunnery, MD  vitamin B-12 1000 MCG tablet Take 1 tablet (1,000 mcg total) by mouth 2 (two) times a day. 10/01/18  Yes Barton Dubois, MD  [facility] TEST test strip USE TO TEST ONCE DAILY. 06/03/15   Susy Frizzle, MD     Family History  Problem Relation Age of Onset   Cancer Mother        uterine   Cancer Father        lung    Social History   Socioeconomic History   Marital status: Legally Separated    Spouse name: Not on file   Number of children: Not on file   Years of education: Not on file   Highest education level: Not on file  Occupational History   Not on file  Social Needs   Financial resource strain: Not on file   Food insecurity    Worry: Not on file    Inability: Not on file   Transportation needs    Medical: Not on file    Non-medical: Not on file  Tobacco Use   Smoking status: Current Every Day Smoker    Packs/day: 2.00    Years: 48.00    Pack years: 96.00    Types: Cigarettes    Start date: 01/07/1970   Smokeless tobacco: Never Used   Tobacco comment: since age 24. smoking 2-3 packs a day  Substance and Sexual Activity   Alcohol use: No    Alcohol/week: 0.0 standard drinks   Drug use: No   Sexual activity: Yes    Birth control/protection: Surgical  Lifestyle   Physical activity    Days per week: Not on file    Minutes per session: Not on file    Stress: Not on file  Relationships   Social connections    Talks on phone: Not on file    Gets together: Not on file    Attends religious service: Not on file    Active member of club or organization: Not on file    Attends meetings of clubs or organizations: Not on file    Relationship status: Not on file  Other Topics Concern   Not on file  Social History Narrative   Not on file     Review of Systems: A 12 point ROS discussed and pertinent positives are indicated in the HPI above.  All other systems are negative.  Review of Systems  Constitutional: Negative for  activity change, fatigue and fever.  HENT: Negative for sore throat.   Respiratory: Negative for cough and shortness of breath.   Cardiovascular: Negative for chest pain.  Gastrointestinal: Negative for abdominal pain.  Neurological: Negative for weakness.  Psychiatric/Behavioral: Negative for behavioral problems and confusion.    Vital Signs: BP (!) 169/66    Pulse 70    Temp 97.7 F (36.5 C) (Temporal)    Ht 5\' 5"  (1.651 m)    Wt 209 lb (94.8 kg)    SpO2 98%    BMI 34.78 kg/m   Physical Exam Vitals signs reviewed.  Neck:     Musculoskeletal: Normal range of motion.  Cardiovascular:     Rate and Rhythm: Normal rate and regular rhythm.     Heart sounds: Normal heart sounds.  Pulmonary:     Breath sounds: Normal breath sounds.  Abdominal:     General: Bowel sounds are normal.  Musculoskeletal: Normal range of motion.  Skin:    General: Skin is warm and dry.  Neurological:     Mental Status: She is alert and oriented to person, place, and time.  Psychiatric:        Mood and Affect: Mood normal.        Behavior: Behavior normal.        Thought Content: Thought content normal.        Judgment: Judgment normal.     Imaging: Ct Soft Tissue Neck W Contrast  Result Date: 09/30/2018 CLINICAL DATA:  Left neck mass solitary afebrile. Abnormal ultrasound today EXAM: CT NECK WITH CONTRAST TECHNIQUE:  Multidetector CT imaging of the neck was performed using the standard protocol following the bolus administration of intravenous contrast. CONTRAST:  33mL OMNIPAQUE IOHEXOL 300 MG/ML  SOLN COMPARISON:  Ultrasound neck 09/30/2018 FINDINGS: Pharynx and larynx: Normal. No mass or swelling. Salivary glands: No inflammation, mass, or stone. Cystic mass in the left upper neck appears to be just below the left parotid gland and appears extra parotid. This corresponds to the ultrasound finding. Thyroid: Negative Lymph nodes: Septated cystic lymph node in the left upper neck just below the left parotid gland measuring 2.0 x 2.0 cm. No other enlarged or abnormal density lymph nodes in the neck. Vascular: Normal vascular enhancement. Atherosclerotic calcification in the carotid bifurcation bilaterally. Limited intracranial: Negative Visualized orbits: Negative Mastoids and visualized paranasal sinuses: Negative Skeleton: No acute skeletal abnormality. Upper chest: Lung apices clear bilaterally. Other: None IMPRESSION: 2 x 2 cm cystic lymph node just below the left parotid gland. Cystic changes in lymph node are concerning for necrosis and could represent malignancy. Lymph node infection considered less likely given the well-defined septations and lack of surrounding inflammation. Biopsy is therefore recommended. No pharyngeal mucosal mass identified. Direct pharyngeal inspection recommended. Electronically Signed   By: Franchot Gallo M.D.   On: 09/30/2018 19:07   Mr Angio Head Wo Contrast  Result Date: 10/01/2018 CLINICAL DATA:  Generalized weakness with blurred vision EXAM: MRA HEAD WITHOUT CONTRAST TECHNIQUE: Angiographic images of the Circle of Willis were obtained using MRA technique without intravenous contrast. COMPARISON:  Brain MRI from yesterday FINDINGS: Carotid and vertebral arteries are smooth and widely patent. No branch occlusion, beading, or aneurysm. Moderate narrowing at the right P2 segment, presumably  atheromatous. There is generalized vessel blurring from artifact. IMPRESSION: No emergent finding. 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 Electronically Signed   By: Monte Fantasia M.D.   On: 10/01/2018 08:30   Mr Brain Wo Contrast (neuro Protocol)  Result Date: 09/30/2018 CLINICAL DATA:  Generalized weakness and blurred vision beginning yesterday. EXAM: MRI HEAD WITHOUT CONTRAST TECHNIQUE: Multiplanar, multiecho pulse sequences of the brain and surrounding structures were obtained without intravenous contrast. COMPARISON:  Head CT 09/29/2018 FINDINGS: Brain: Diffusion imaging does not show any acute or subacute infarction. There are mild chronic small-vessel ischemic changes of the pons. No focal cerebellar insult. 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 Cerebral hemispheres show moderate chronic small-vessel ischemic changes of the deep and subcortical white matter. No cortical or large vessel territory infarction. No mass lesion, hemorrhage, hydrocephalus or extra-axial collection. Vascular: Major vessels at the base of the brain show flow. Skull and upper cervical spine: Negative Sinuses/Orbits: Clear/normal Other: None IMPRESSION: No acute finding by MRI. 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 Chronic small-vessel ischemic changes of the pons and cerebral hemispheric white matter. Electronically Signed   By: Nelson Chimes M.D.   On: 09/30/2018 10:17   US Soft Tissue Head & Neck (non-thyroid)  Result Date: 09/30/2018 CLINICAL DATA:  Tender swollen area of the left neck. EXAM: ULTRASOUND OF HEAD/NECK SOFT TISSUES TECHNIQUE: Ultrasound examination of the head and neck soft tissues was performed in the area of clinical concern. COMPARISON:  None. FINDINGS: At the area of the palpable abnormality, in the left neck at the level 2 nodal station, there is a 2.6 x 1.9 x 2.3 cm ovoid necrotic structure with multiple septations most consistent with a necrotic lymph node. This is a  worrisome finding and may indicate an underlying head and neck carcinoma. CT scan of the neck with contrast is suggested. IMPRESSION: In 2.6 x 1.9 x 2.3 cm necrotic level 2 lymph node on the left. This is a worrisome finding and could indicate underlying head and neck carcinoma. Neck CT with contrast is recommended. Electronically Signed   By: Nelson Chimes M.D.   On: 09/30/2018 13:16   US Carotid Bilateral (at [facility] Only)  Result Date: 09/30/2018 CLINICAL DATA:  TIA. Blurry vision. Syncopal episode. History of hypertension and smoking. EXAM: BILATERAL CAROTID DUPLEX ULTRASOUND TECHNIQUE: Pearline Cables scale imaging, color Doppler and duplex ultrasound were performed of bilateral carotid and vertebral arteries in the neck. COMPARISON:  None. FINDINGS: Criteria: Quantification of carotid stenosis is based on velocity parameters that correlate the residual internal carotid diameter with NASCET-based stenosis levels, using the diameter of the distal internal carotid lumen as the denominator for stenosis measurement. The following velocity measurements were obtained: RIGHT ICA: 141/15 cm/sec CCA: 607/37 cm/sec SYSTOLIC ICA/CCA RATIO:  1.0 ECA: 237 cm/sec LEFT ICA: 199/23 cm/sec CCA: 106/26 cm/sec SYSTOLIC ICA/CCA RATIO:  1.6 ECA: 311 cm/sec RIGHT CAROTID ARTERY: There is a moderate to large amount of eccentric echogenic shadowing plaque within the right carotid bulb (image 16), extending to involve the origin and proximal aspects of the right internal carotid artery (image 26), which results in elevated peak systolic velocities within the proximal mid aspects the right internal carotid artery. Greatest acquired peak systolic velocity within mid right ICA measures 141 centimeters/second (image 30). RIGHT VERTEBRAL ARTERY:  Antegrade Flow LEFT CAROTID ARTERY: There is a moderate to large amount of eccentric echogenic plaque within the left carotid bulb (image 52), extending to involve the origin and proximal aspects of  the left internal carotid artery (image 61), which results in elevated peak systolic velocities throughout the interrogated course the left internal carotid artery. Greatest acquired peak systolic velocity within the mid left ICA measures 199 centimeters/second (image 66). LEFT VERTEBRAL ARTERY:  Antegrade Flow IMPRESSION: Moderate to large amount of atherosclerotic plaque, left greater than right, results in elevated peak systolic velocities within the bilateral internal carotid arteries, compatible with the 50-69% luminal narrowing range bilaterally. Further evaluation with CTA could performed as clinically indicated. Electronically Signed   By: Sandi Mariscal M.D.   On: 09/30/2018 12:10   Ct Head Code Stroke Wo Contrast  Result Date: 09/29/2018 CLINICAL DATA:  Code stroke. Dizziness and generalized weakness since 8 a.m. today. EXAM: CT HEAD WITHOUT CONTRAST TECHNIQUE: Contiguous axial images were obtained from the base of the skull through the vertex without intravenous contrast. COMPARISON:  Report of CT head 01/14/2001. Images are not available. FINDINGS: Brain: Mild generalized atrophy and moderate diffuse subcortical white matter disease is present bilaterally. Basal ganglia are intact. Age indeterminate lacunar infarct of the left thalamus is likely remote. No focal cortical abnormalities are present. The ventricles are of normal size. The brainstem and cerebellum are within normal limits. Vascular: Atherosclerotic calcifications are present within the cavernous internal carotid arteries bilaterally. There is no asymmetric hyperdense vessel. Skull: Calvarium is intact. No focal lytic or blastic lesions are present. Sinuses/Orbits: The paranasal sinuses and mastoid air cells are clear. The globes and orbits are within normal limits. ASPECTS [facility] Stroke Program Early CT Score) - Ganglionic level infarction (caudate, lentiform nuclei, internal capsule, insula, M1-M3 cortex): 7/7 - Supraganglionic  infarction (M4-M6 cortex): 3/3 Total score (0-10 with 10 being normal): 10/10 IMPRESSION: 1. Mild generalized atrophy and moderate diffuse white matter disease without acute cortical infarct. 2. 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 Given the other white matter disease. This is likely remote. 3. Atherosclerosis.  No hyperdense vessel. 4. ASPECTS is 10/10 These results were called by telephone at the time of interpretation on 09/29/2018 at 11:47 am to Dr. Nanda Quinton , who verbally acknowledged these results. Electronically Signed   By: San Morelle M.D.   On: 09/29/2018 11:47    Labs:  CBC: Recent Labs    02/25/18 1544 08/07/18 1132 08/27/18 1035 09/29/18 1116 09/29/18 1143  WBC 7.2 7.1 7.4 8.9  --   HGB 14.2 14.8 13.3 13.5 15.3*  HCT 42.4 44.5 40.3 40.9 45.0  PLT 271 278 286 243  --     COAGS: Recent Labs    09/29/18 1116  INR 0.9  APTT 27    BMP: Recent Labs    08/07/18 1132  09/04/18 1642 09/16/18 1451 09/29/18 1116 09/29/18 1143 10/08/18 1236  NA 135   < > 132* 136 130* 129* 129*  K 4.0   < > 5.6* 4.6 4.2 4.2 4.6  CL 99   < > 97* 101 96* 95* 95*  CO2 26   < > 28 25 24   --  27  GLUCOSE 92   < > 95 78 127* 122* 95  BUN 17   < > 27* 19 25* 24* 19  CALCIUM 8.9   < > 9.4 9.3 8.9  --  8.9  CREATININE 0.97   < > 1.56* 1.38* 1.63* 1.70* 1.11*  GFRNONAA >60  --   --   --  32*  --   --   GFRAA >60  --   --   --  38*  --   --    < > = values in this interval not displayed.    LIVER FUNCTION TESTS: Recent Labs    02/25/18 1544 08/07/18 1132 08/27/18 1035 09/29/18 1116  BILITOT 0.2 0.4 0.3 0.5  AST 12 13* 11 15  ALT 9 12 5* 14  ALKPHOS  --  73  --  73  PROT 6.9 7.4 6.4 7.5  ALBUMIN  --  4.0  --  4.1    TUMOR MARKERS: No results for input(s): AFPTM, CEA, CA199, CHROMGRNA in the last 8760 hours.  Assessment and Plan:  Left neck mass Pathology neg from office fna last week Painful and enlarged Scheduled for biopsy in  IR Risks and benefits of left neck mass bx was discussed with the patient and/or patient's family including, but not limited to bleeding, infection, damage to adjacent structures or low yield requiring additional tests.  All of the questions were answered and there is agreement to proceed.  Consent signed and in chart.    Thank you for this interesting consult.  I greatly enjoyed meeting Paula Long and look forward to participating in their care.  A copy of this report was sent to the requesting provider on this date.  Electronically Signed: Lavonia Drafts, PA-C 10/21/2018, 11:49 AM   I spent a total of  30 Minutes   in face to face in clinical consultation, greater than 50% of which was counseling/coordinating care for left neck mass bx

## 2018-10-22 DIAGNOSIS — E1122 Type 2 diabetes mellitus with diabetic chronic kidney disease: Secondary | ICD-10-CM | POA: Diagnosis not present

## 2018-10-22 DIAGNOSIS — F17210 Nicotine dependence, cigarettes, uncomplicated: Secondary | ICD-10-CM | POA: Diagnosis not present

## 2018-10-22 DIAGNOSIS — G459 Transient cerebral ischemic attack, unspecified: Secondary | ICD-10-CM | POA: Diagnosis not present

## 2018-10-22 DIAGNOSIS — I10 Essential (primary) hypertension: Secondary | ICD-10-CM | POA: Diagnosis not present

## 2018-10-22 DIAGNOSIS — J449 Chronic obstructive pulmonary disease, unspecified: Secondary | ICD-10-CM | POA: Diagnosis not present

## 2018-10-22 DIAGNOSIS — M5442 Lumbago with sciatica, left side: Secondary | ICD-10-CM | POA: Diagnosis not present

## 2018-10-22 DIAGNOSIS — R2681 Unsteadiness on feet: Secondary | ICD-10-CM | POA: Diagnosis not present

## 2018-10-22 DIAGNOSIS — N183 Chronic kidney disease, stage 3 (moderate): Secondary | ICD-10-CM | POA: Diagnosis not present

## 2018-10-23 DIAGNOSIS — I10 Essential (primary) hypertension: Secondary | ICD-10-CM | POA: Diagnosis not present

## 2018-10-23 DIAGNOSIS — J449 Chronic obstructive pulmonary disease, unspecified: Secondary | ICD-10-CM | POA: Diagnosis not present

## 2018-10-23 DIAGNOSIS — F17210 Nicotine dependence, cigarettes, uncomplicated: Secondary | ICD-10-CM | POA: Diagnosis not present

## 2018-10-23 DIAGNOSIS — R2681 Unsteadiness on feet: Secondary | ICD-10-CM | POA: Diagnosis not present

## 2018-10-23 DIAGNOSIS — M5442 Lumbago with sciatica, left side: Secondary | ICD-10-CM | POA: Diagnosis not present

## 2018-10-23 DIAGNOSIS — G459 Transient cerebral ischemic attack, unspecified: Secondary | ICD-10-CM | POA: Diagnosis not present

## 2018-10-23 DIAGNOSIS — N183 Chronic kidney disease, stage 3 (moderate): Secondary | ICD-10-CM | POA: Diagnosis not present

## 2018-10-23 DIAGNOSIS — E1122 Type 2 diabetes mellitus with diabetic chronic kidney disease: Secondary | ICD-10-CM | POA: Diagnosis not present

## 2018-10-24 ENCOUNTER — Other Ambulatory Visit: Payer: Self-pay

## 2018-10-25 ENCOUNTER — Ambulatory Visit: Payer: Self-pay | Admitting: Family Medicine

## 2018-11-01 DIAGNOSIS — H18413 Arcus senilis, bilateral: Secondary | ICD-10-CM | POA: Diagnosis not present

## 2018-11-01 DIAGNOSIS — H2511 Age-related nuclear cataract, right eye: Secondary | ICD-10-CM | POA: Diagnosis not present

## 2018-11-01 DIAGNOSIS — H25043 Posterior subcapsular polar age-related cataract, bilateral: Secondary | ICD-10-CM | POA: Diagnosis not present

## 2018-11-01 DIAGNOSIS — H25013 Cortical age-related cataract, bilateral: Secondary | ICD-10-CM | POA: Diagnosis not present

## 2018-11-01 DIAGNOSIS — H2513 Age-related nuclear cataract, bilateral: Secondary | ICD-10-CM | POA: Diagnosis not present

## 2018-11-04 ENCOUNTER — Other Ambulatory Visit: Payer: Self-pay

## 2018-11-05 ENCOUNTER — Encounter: Payer: Self-pay | Admitting: Family Medicine

## 2018-11-05 ENCOUNTER — Ambulatory Visit (INDEPENDENT_AMBULATORY_CARE_PROVIDER_SITE_OTHER): Payer: Medicare HMO | Admitting: Family Medicine

## 2018-11-05 VITALS — BP 142/80 | HR 80 | Temp 98.4°F | Resp 14 | Ht 65.0 in | Wt 213.0 lb

## 2018-11-05 DIAGNOSIS — E1122 Type 2 diabetes mellitus with diabetic chronic kidney disease: Secondary | ICD-10-CM

## 2018-11-05 DIAGNOSIS — R600 Localized edema: Secondary | ICD-10-CM

## 2018-11-05 DIAGNOSIS — N183 Chronic kidney disease, stage 3 unspecified: Secondary | ICD-10-CM

## 2018-11-05 DIAGNOSIS — R609 Edema, unspecified: Secondary | ICD-10-CM | POA: Diagnosis not present

## 2018-11-05 DIAGNOSIS — I10 Essential (primary) hypertension: Secondary | ICD-10-CM

## 2018-11-05 MED ORDER — FUROSEMIDE 40 MG PO TABS: 40.0000 mg | ORAL_TABLET | Freq: Every day | ORAL | 3 refills | Status: DC

## 2018-11-05 NOTE — Patient Instructions (Signed)
F/U as previous Take lasix 40mg  once a day

## 2018-11-05 NOTE — Progress Notes (Signed)
   Subjective:    Patient ID: Paula Long, female    DOB: September 25, 1950, 68 y.o.   MRN: [id number]  Patient presents for Edema (swelling and pain in BLE )  Leg swelling bilat 1 month ago,was given lasix 40mg  x 3 days, due to renal function at that time, now on lasix 20mg  once a day , has continued to swelll since then, she does try to elevate, pain on top of feet where swelling is   weight at home 212lbs    Can tell a little difference in here breathing sometimes, no chest pain    Cataract exam on MOnday    Neck mass surgery due to cancerous cells in biopsy- has appt on 8/28 to Dr. Hendricks Limes with [facility]     Review Of Systems:  GEN- denies fatigue, fever, weight loss,weakness, recent illness HEENT- denies eye drainage, change in vision, nasal discharge, CVS- denies chest pain, palpitations RESP- denies SOB, cough, wheeze ABD- denies N/V, change in stools, abd pain GU- denies dysuria, hematuria, dribbling, incontinence MSK- denies joint pain, muscle aches, injury Neuro- denies headache, dizziness, syncope, seizure activity       Objective:    BP (!) 142/80   Pulse 80   Temp 98.4 F (36.9 C) (Oral)   Resp 14   Ht 5\' 5"  (1.651 m)   Wt 213 lb (96.6 kg)   SpO2 96%   BMI 35.45 kg/m  GEN- NAD, alert and oriented x3 HEENT- PERRL, EOMI, non injected sclera, pink conjunctiva, MMM, oropharynx clear Neck- Supple, no JVD CVS- RRR, no murmur RESP-CTAB ABD-NABS,soft,NT,ND EXT- 1+ pitting edema wit mild ballooning on top  Pulses- Radial, DP- 2+        Assessment & Plan:      Problem List Items Addressed This Visit      Unprioritized   CKD stage 3 secondary to diabetes ([place]) - Primary   Essential hypertension    BP mildly elevated today, may improve with reduction of fluid, no other changes      Relevant Medications   furosemide (LASIX) 40 MG tablet   Peripheral edema    Increase lasix back to 40mg  once a day  Give script for compression hose F/U in  2 weeks as scheduled will repeat renal function at that time  No sign of fluid in lungs         Note: This dictation was prepared with Dragon dictation along with smaller phrase technology. Any transcriptional errors that result from this process are unintentional.

## 2018-11-05 NOTE — Assessment & Plan Note (Signed)
Increase lasix back to 40mg  once a day  Give script for compression hose F/U in 2 weeks as scheduled will repeat renal function at that time  No sign of fluid in lungs

## 2018-11-05 NOTE — Assessment & Plan Note (Signed)
BP mildly elevated today, may improve with reduction of fluid, no other changes

## 2018-11-08 ENCOUNTER — Telehealth: Payer: Self-pay | Admitting: *Deleted

## 2018-11-08 MED ORDER — TRAMADOL HCL 50 MG PO TABS: 50.0000 mg | ORAL_TABLET | Freq: Three times a day (TID) | ORAL | 0 refills | Status: AC | PRN

## 2018-11-08 NOTE — Telephone Encounter (Signed)
Call placed to patient. LMTRC.  

## 2018-11-08 NOTE — Telephone Encounter (Signed)
Patient returned call and made aware.   Agreeable to plan for Ultram.

## 2018-11-08 NOTE — Telephone Encounter (Signed)
She can have ultram for pain, she is allergy to hydrocodone and oxycodone per report

## 2018-11-08 NOTE — Telephone Encounter (Signed)
Received call from patient.   Reports that she is having increased pain from her neck. States that surgeon recommended she contact PCP to prescribe medication until seen at general surgery on 11/15/2018.  MD please advise.

## 2018-11-12 DIAGNOSIS — G4733 Obstructive sleep apnea (adult) (pediatric): Secondary | ICD-10-CM | POA: Diagnosis not present

## 2018-11-12 DIAGNOSIS — M461 Sacroiliitis, not elsewhere classified: Secondary | ICD-10-CM | POA: Diagnosis not present

## 2018-11-12 DIAGNOSIS — I6523 Occlusion and stenosis of bilateral carotid arteries: Secondary | ICD-10-CM | POA: Diagnosis not present

## 2018-11-12 DIAGNOSIS — G4714 Hypersomnia due to medical condition: Secondary | ICD-10-CM | POA: Diagnosis not present

## 2018-11-13 ENCOUNTER — Other Ambulatory Visit: Payer: Self-pay | Admitting: Family Medicine

## 2018-11-15 DIAGNOSIS — Z85828 Personal history of other malignant neoplasm of skin: Secondary | ICD-10-CM | POA: Diagnosis not present

## 2018-11-15 DIAGNOSIS — E041 Nontoxic single thyroid nodule: Secondary | ICD-10-CM | POA: Diagnosis not present

## 2018-11-15 DIAGNOSIS — F17210 Nicotine dependence, cigarettes, uncomplicated: Secondary | ICD-10-CM | POA: Diagnosis not present

## 2018-11-15 DIAGNOSIS — D378 Neoplasm of uncertain behavior of other specified digestive organs: Secondary | ICD-10-CM | POA: Diagnosis not present

## 2018-11-15 DIAGNOSIS — Z8673 Personal history of transient ischemic attack (TIA), and cerebral infarction without residual deficits: Secondary | ICD-10-CM | POA: Diagnosis not present

## 2018-11-17 DIAGNOSIS — E041 Nontoxic single thyroid nodule: Secondary | ICD-10-CM | POA: Insufficient documentation

## 2018-11-18 ENCOUNTER — Other Ambulatory Visit: Payer: Self-pay | Admitting: Family Medicine

## 2018-11-19 ENCOUNTER — Ambulatory Visit: Payer: Medicare HMO | Admitting: Family Medicine

## 2018-11-22 ENCOUNTER — Other Ambulatory Visit: Payer: Self-pay

## 2018-11-22 ENCOUNTER — Ambulatory Visit (INDEPENDENT_AMBULATORY_CARE_PROVIDER_SITE_OTHER): Payer: Medicare HMO | Admitting: Family Medicine

## 2018-11-22 VITALS — BP 152/68 | HR 77 | Temp 97.9°F | Resp 18 | Ht 65.0 in | Wt 213.4 lb

## 2018-11-22 DIAGNOSIS — I10 Essential (primary) hypertension: Secondary | ICD-10-CM

## 2018-11-22 DIAGNOSIS — R609 Edema, unspecified: Secondary | ICD-10-CM

## 2018-11-22 NOTE — Patient Instructions (Addendum)
Wear the ace wraps during the day, remove at bedtime Keep hydrated with the lasix No change to medications F/U 3 months for Physical

## 2018-11-22 NOTE — Progress Notes (Signed)
   Subjective:    Patient ID: Paula Long, female    DOB: 1950/12/04, 68 y.o.   MRN: [id number]  Patient presents for Edema (both legs and feet for 2-3 months)   Pt here for intermin recheck on renal function and edema in legs   HTN- Bp [id number] SYSTOLIC at home   Currently Taking cardizem, lasix 40mg  and hydralazine 50mg  TID    She is still awaiting surgery of abnormal left neck mass  -Dr Hendricks Limes     Due for rEPEAAT BMET , last Cr 1.11, needs to be rechecked since back on higher dose of lasix- now 40mg   No SOB, no chest pain,nO HA  still has swelling  Pharmacy could not order her compression hose due to backorder from COVID-19   she has been trying to elevate her feet   Review Of Systems:  GEN- denies fatigue, fever, weight loss,weakness, recent illness HEENT- denies eye drainage, change in vision, nasal discharge, CVS- denies chest pain, palpitations RESP- denies SOB, cough, wheeze ABD- denies N/V, change in stools, abd pain GU- denies dysuria, hematuria, dribbling, incontinence MSK- + joint pain, muscle aches, injury Neuro- denies headache, dizziness, syncope, seizure activity       Objective:    BP (!) 152/68 (BP Location: Right Arm, Patient Position: Sitting, Cuff Size: Large)   Pulse 77   Temp 97.9 F (36.6 C) (Oral)   Resp 18   Ht 5\' 5"  (1.651 m)   Wt 213 lb 6.4 oz (96.8 kg)   SpO2 95%   BMI 35.51 kg/m  GEN- NAD, alert and oriented x3 HEENT- PERRL, EOMI, non injected sclera, pink conjunctiva, MMM, oropharynx clear Neck- Supple, no JVD CVS- RRR, no murmur RESP-CTAB ABD-NABS,soft,NT,ND EXT- pitting edema to mid shins < 1+ Pulses- Radial  2+        Assessment & Plan:      Problem List Items Addressed This Visit      Unprioritized   Essential hypertension - Primary    BP is fair, she has not responded well to multiple medications due to her renal function.  Will recheck renal function see how she is tolerating lasix 40mg  Today we used ACE Wraps to  lower legs since she can not get compression hose , she can remove at bedtime  Will f/u with her cardiologist about additional bp medication as well       Relevant Orders   Basic metabolic panel (Completed)   Peripheral edema   Relevant Orders   Basic metabolic panel (Completed)      Note: This dictation was prepared with Dragon dictation along with smaller phrase technology. Any transcriptional errors that result from this process are unintentional.

## 2018-11-23 LAB — BASIC METABOLIC PANEL
BUN/Creatinine Ratio: 16 (calc) (ref 6–22)
BUN: 20 mg/dL (ref 7–25)
CO2: 28 mmol/L (ref 20–32)
Calcium: 8.9 mg/dL (ref 8.6–10.4)
Chloride: 97 mmol/L — ABNORMAL LOW (ref 98–110)
Creat: 1.26 mg/dL — ABNORMAL HIGH (ref 0.50–0.99)
Glucose, Bld: 92 mg/dL (ref 65–99)
Potassium: 4.4 mmol/L (ref 3.5–5.3)
Sodium: 134 mmol/L — ABNORMAL LOW (ref 135–146)

## 2018-11-25 ENCOUNTER — Encounter: Payer: Self-pay | Admitting: Family Medicine

## 2018-11-25 NOTE — Assessment & Plan Note (Signed)
BP is fair, she has not responded well to multiple medications due to her renal function.  Will recheck renal function see how she is tolerating lasix 40mg  Today we used ACE Wraps to lower legs since she can not get compression hose , she can remove at bedtime  Will f/u with her cardiologist about additional bp medication as well

## 2018-12-06 DIAGNOSIS — K118 Other diseases of salivary glands: Secondary | ICD-10-CM | POA: Diagnosis not present

## 2018-12-06 DIAGNOSIS — N189 Chronic kidney disease, unspecified: Secondary | ICD-10-CM | POA: Diagnosis not present

## 2018-12-06 DIAGNOSIS — E785 Hyperlipidemia, unspecified: Secondary | ICD-10-CM | POA: Diagnosis not present

## 2018-12-06 DIAGNOSIS — K119 Disease of salivary gland, unspecified: Secondary | ICD-10-CM | POA: Diagnosis not present

## 2018-12-06 DIAGNOSIS — E041 Nontoxic single thyroid nodule: Secondary | ICD-10-CM | POA: Diagnosis not present

## 2018-12-06 DIAGNOSIS — I129 Hypertensive chronic kidney disease with stage 1 through stage 4 chronic kidney disease, or unspecified chronic kidney disease: Secondary | ICD-10-CM | POA: Diagnosis not present

## 2018-12-06 DIAGNOSIS — I2510 Atherosclerotic heart disease of native coronary artery without angina pectoris: Secondary | ICD-10-CM | POA: Diagnosis not present

## 2018-12-06 DIAGNOSIS — R221 Localized swelling, mass and lump, neck: Secondary | ICD-10-CM | POA: Diagnosis not present

## 2018-12-06 DIAGNOSIS — E669 Obesity, unspecified: Secondary | ICD-10-CM | POA: Insufficient documentation

## 2018-12-06 DIAGNOSIS — J449 Chronic obstructive pulmonary disease, unspecified: Secondary | ICD-10-CM | POA: Diagnosis not present

## 2018-12-06 DIAGNOSIS — E1122 Type 2 diabetes mellitus with diabetic chronic kidney disease: Secondary | ICD-10-CM | POA: Diagnosis not present

## 2018-12-06 DIAGNOSIS — I10 Essential (primary) hypertension: Secondary | ICD-10-CM | POA: Diagnosis not present

## 2018-12-06 DIAGNOSIS — R351 Nocturia: Secondary | ICD-10-CM | POA: Diagnosis not present

## 2018-12-06 DIAGNOSIS — K219 Gastro-esophageal reflux disease without esophagitis: Secondary | ICD-10-CM | POA: Diagnosis not present

## 2018-12-09 DIAGNOSIS — H2511 Age-related nuclear cataract, right eye: Secondary | ICD-10-CM | POA: Diagnosis not present

## 2018-12-10 DIAGNOSIS — H2512 Age-related nuclear cataract, left eye: Secondary | ICD-10-CM | POA: Diagnosis not present

## 2018-12-13 ENCOUNTER — Ambulatory Visit (INDEPENDENT_AMBULATORY_CARE_PROVIDER_SITE_OTHER): Payer: Medicare HMO | Admitting: Family Medicine

## 2018-12-13 ENCOUNTER — Encounter: Payer: Self-pay | Admitting: Family Medicine

## 2018-12-13 ENCOUNTER — Other Ambulatory Visit: Payer: Self-pay

## 2018-12-13 DIAGNOSIS — E538 Deficiency of other specified B group vitamins: Secondary | ICD-10-CM

## 2018-12-13 DIAGNOSIS — R413 Other amnesia: Secondary | ICD-10-CM | POA: Diagnosis not present

## 2018-12-13 MED ORDER — CYANOCOBALAMIN 1000 MCG PO TABS: 1000.0000 ug | ORAL_TABLET | Freq: Every day | ORAL | 3 refills | Status: DC

## 2018-12-13 NOTE — Progress Notes (Signed)
   Subjective:    Patient ID: Paula Long, female    DOB: Jul 12, 1950, 68 y.o.   MRN: [id number]  Patient presents for Evaluation for Memory Problems (family states that she is forgetting easily)   Her children feel like she forgets everything. She was  takling with kids and mixed up some appointments. She has not missed any appointments   She has no problems remembering names, her meds, Functions and lives on her own.   She has not lost any valuable, does her own finances without difficulty.   She has had aunts or unles with dementia but they were in the 83's and  80's    She does have low B12, she is not on any current supplements  MRI brain- done in July    Review Of Systems:  GEN- denies fatigue, fever, weight loss,weakness, recent illness HEENT- denies eye drainage, change in vision, nasal discharge, CVS- denies chest pain, palpitations RESP- denies SOB, cough, wheeze ABD- denies N/V, change in stools, abd pain GU- denies dysuria, hematuria, dribbling, incontinence MSK- denies joint pain, muscle aches, injury Neuro- denies headache, dizziness, syncope, seizure activity       Objective:    BP (!) 144/70   Pulse 90   Temp 98.2 F (36.8 C) (Oral)   Resp 16   Ht 5\' 5"  (1.651 m)   Wt 217 lb (98.4 kg)   SpO2 96%   BMI 36.11 kg/m  GEN- NAD, alert and oriented x3 CVS- RRR, no murmur RESP-CTAB Neuro- CNII-XII in tact, MMSE 27/30 , normal recall Pulses- Radial  2+ ,  Ext- trace edema wearing compression hose   MMSE-   27/30     Assessment & Plan:      Problem List Items Addressed This Visit      Unprioritized   B12 deficiency   Memory changes    Some memory changes, but would not diagnose with dementia at this time She is able to do her finances, controls her day to day and ADL'S Discussed things she can do to keep memory sharp Will also treat the b12 deficiency- starting with oral 1000mg   MRI did not show any advanced atrophy         Note: This  dictation was prepared with Dragon dictation along with smaller phrase technology. Any transcriptional errors that result from this process are unintentional.

## 2018-12-13 NOTE — Patient Instructions (Addendum)
Take B12 once a day  You do not have dementia at this time, just mild memory changes  F/U 4 months

## 2018-12-15 ENCOUNTER — Encounter: Payer: Self-pay | Admitting: Family Medicine

## 2018-12-15 NOTE — Assessment & Plan Note (Signed)
Some memory changes, but would not diagnose with dementia at this time She is able to do her finances, controls her day to day and ADL'S Discussed things she can do to keep memory sharp Will also treat the b12 deficiency- starting with oral 1000mg   MRI did not show any advanced atrophy

## 2018-12-23 ENCOUNTER — Other Ambulatory Visit: Payer: Self-pay | Admitting: Family Medicine

## 2018-12-23 DIAGNOSIS — Z20828 Contact with and (suspected) exposure to other viral communicable diseases: Secondary | ICD-10-CM | POA: Diagnosis not present

## 2018-12-23 DIAGNOSIS — Z01812 Encounter for preprocedural laboratory examination: Secondary | ICD-10-CM | POA: Diagnosis not present

## 2018-12-23 DIAGNOSIS — Z1159 Encounter for screening for other viral diseases: Secondary | ICD-10-CM | POA: Diagnosis not present

## 2018-12-30 DIAGNOSIS — K119 Disease of salivary gland, unspecified: Secondary | ICD-10-CM | POA: Diagnosis not present

## 2018-12-30 DIAGNOSIS — J449 Chronic obstructive pulmonary disease, unspecified: Secondary | ICD-10-CM | POA: Diagnosis not present

## 2018-12-30 DIAGNOSIS — K118 Other diseases of salivary glands: Secondary | ICD-10-CM | POA: Diagnosis not present

## 2018-12-30 DIAGNOSIS — D110 Benign neoplasm of parotid gland: Secondary | ICD-10-CM | POA: Diagnosis not present

## 2018-12-30 DIAGNOSIS — I129 Hypertensive chronic kidney disease with stage 1 through stage 4 chronic kidney disease, or unspecified chronic kidney disease: Secondary | ICD-10-CM | POA: Diagnosis not present

## 2018-12-30 DIAGNOSIS — R351 Nocturia: Secondary | ICD-10-CM | POA: Diagnosis not present

## 2018-12-30 DIAGNOSIS — N189 Chronic kidney disease, unspecified: Secondary | ICD-10-CM | POA: Diagnosis not present

## 2018-12-30 DIAGNOSIS — E785 Hyperlipidemia, unspecified: Secondary | ICD-10-CM | POA: Diagnosis not present

## 2018-12-30 DIAGNOSIS — I2510 Atherosclerotic heart disease of native coronary artery without angina pectoris: Secondary | ICD-10-CM | POA: Diagnosis not present

## 2018-12-30 DIAGNOSIS — E1122 Type 2 diabetes mellitus with diabetic chronic kidney disease: Secondary | ICD-10-CM | POA: Diagnosis not present

## 2018-12-30 DIAGNOSIS — K219 Gastro-esophageal reflux disease without esophagitis: Secondary | ICD-10-CM | POA: Diagnosis not present

## 2019-01-20 ENCOUNTER — Other Ambulatory Visit: Payer: Self-pay | Admitting: Family Medicine

## 2019-01-22 ENCOUNTER — Other Ambulatory Visit (HOSPITAL_COMMUNITY): Payer: Self-pay | Admitting: Family Medicine

## 2019-01-22 DIAGNOSIS — Z1231 Encounter for screening mammogram for malignant neoplasm of breast: Secondary | ICD-10-CM

## 2019-01-25 ENCOUNTER — Other Ambulatory Visit: Payer: Self-pay | Admitting: Family Medicine

## 2019-01-30 ENCOUNTER — Inpatient Hospital Stay (HOSPITAL_COMMUNITY): Admission: RE | Admit: 2019-01-30 | Payer: Medicare HMO | Source: Ambulatory Visit

## 2019-01-31 ENCOUNTER — Other Ambulatory Visit: Payer: Self-pay

## 2019-01-31 ENCOUNTER — Encounter: Payer: Self-pay | Admitting: Family Medicine

## 2019-01-31 ENCOUNTER — Ambulatory Visit (INDEPENDENT_AMBULATORY_CARE_PROVIDER_SITE_OTHER): Payer: Medicare HMO | Admitting: Family Medicine

## 2019-01-31 VITALS — BP 158/84 | HR 66 | Temp 98.1°F | Resp 14 | Ht 65.0 in | Wt 221.0 lb

## 2019-01-31 DIAGNOSIS — N183 Chronic kidney disease, stage 3 unspecified: Secondary | ICD-10-CM

## 2019-01-31 DIAGNOSIS — R609 Edema, unspecified: Secondary | ICD-10-CM | POA: Diagnosis not present

## 2019-01-31 DIAGNOSIS — E1122 Type 2 diabetes mellitus with diabetic chronic kidney disease: Secondary | ICD-10-CM | POA: Diagnosis not present

## 2019-01-31 DIAGNOSIS — R21 Rash and other nonspecific skin eruption: Secondary | ICD-10-CM

## 2019-01-31 DIAGNOSIS — R600 Localized edema: Secondary | ICD-10-CM

## 2019-01-31 DIAGNOSIS — I10 Essential (primary) hypertension: Secondary | ICD-10-CM | POA: Diagnosis not present

## 2019-01-31 MED ORDER — CLOBETASOL PROP EMOLLIENT BASE 0.05 % EX CREA: TOPICAL_CREAM | CUTANEOUS | 0 refills | Status: DC

## 2019-01-31 MED ORDER — FUROSEMIDE 40 MG PO TABS: 40.0000 mg | ORAL_TABLET | Freq: Two times a day (BID) | ORAL | 3 refills | Status: DC

## 2019-01-31 NOTE — Progress Notes (Signed)
   Subjective:    Patient ID: Paula Long, female    DOB: 30-Aug-1950, 68 y.o.   MRN: [id number]  Patient presents for Rash (x4 days- hard knots to arms and torso that itch- seem to be spreading) Patient presents with knots on her arm and back.  States that she noticed 2 of them on Monday she now has 1 on her left lower back and one on her left chest wall.  They are pruritic.  She does not recall anything biting her.  No known contact with anyone with any rash.  No change in soap detergent medications recently  Also noted that she has significant edema on her lower extremities with a 5 pound weight gain since September.  She states her blood pressure is also still been up.  No chest pain no shortness of breath associated    Review Of Systems:  GEN- denies fatigue, fever, weight loss,weakness, recent illness HEENT- denies eye drainage, change in vision, nasal discharge, CVS- denies chest pain, palpitations RESP- denies SOB, cough, wheeze ABD- denies N/V, change in stools, abd pain GU- denies dysuria, hematuria, dribbling, incontinence MSK- denies joint pain, muscle aches, injury Neuro- denies headache, dizziness, syncope, seizure activity       Objective:    BP (!) 158/84   Pulse 66   Temp 98.1 F (36.7 C) (Temporal)   Resp 14   Ht 5\' 5"  (1.651 m)   Wt 221 lb (100.2 kg)   SpO2 96%   BMI 36.78 kg/m  GEN- NAD, alert and oriented x3 HEENT- PERRL, EOMI, non injected sclera, pink conjunctiva, MMM, oropharynx clear Neck- Supple, no JVD CVS- RRR, no murmur RESP-CTAB Skin to erythematous papular lesions some with scabs in the center 1 on her left arm similar lesion on the left chest wall on her left lower side has small clear fluid appears to be a bite , lesions nontender ABD-NABS,soft,NT,ND EXT-1+ pitting edema to mid shins  Pulses- Radial  2+          Assessment & Plan:      Problem List Items Addressed This Visit      Unprioritized   CKD stage 3 secondary to  diabetes ([place])   Essential hypertension    Blood pressure is still elevated she also has significant edema fluid overload.  We will increase her Lasix to 40 mg twice a day for a week she will follow-up next week I will recheck her renal function.  Advised her to hydrate with this increased dose.  Regards to the skin rash today.  To be bite-like lesions on her.  We will treat with topical betazole twice a day.  No overt sign of infection.  If they continue to spread we may need to do oral prednisone may want to hold off since she already has the edema and I feel like this may worsen with additional prednisone.      Relevant Medications   furosemide (LASIX) 40 MG tablet   Peripheral edema    Other Visit Diagnoses    Skin rash    -  Primary      Note: This dictation was prepared with Dragon dictation along with smaller phrase technology. Any transcriptional errors that result from this process are unintentional.

## 2019-01-31 NOTE — Patient Instructions (Addendum)
Use the topical steroid twice a day on the rash  Increase lasix to 40mg  twice a day for 1 week   F/U 1 week for recheck

## 2019-01-31 NOTE — Assessment & Plan Note (Signed)
Blood pressure is still elevated she also has significant edema fluid overload.  We will increase her Lasix to 40 mg twice a day for a week she will follow-up next week I will recheck her renal function.  Advised her to hydrate with this increased dose.  Regards to the skin rash today.  To be bite-like lesions on her.  We will treat with topical betazole twice a day.  No overt sign of infection.  If they continue to spread we may need to do oral prednisone may want to hold off since she already has the edema and I feel like this may worsen with additional prednisone.

## 2019-02-03 ENCOUNTER — Ambulatory Visit: Payer: Medicare HMO | Admitting: Cardiology

## 2019-02-03 ENCOUNTER — Other Ambulatory Visit: Payer: Self-pay | Admitting: Family Medicine

## 2019-02-03 DIAGNOSIS — H2512 Age-related nuclear cataract, left eye: Secondary | ICD-10-CM | POA: Diagnosis not present

## 2019-02-05 ENCOUNTER — Other Ambulatory Visit: Payer: Self-pay | Admitting: Family Medicine

## 2019-02-10 ENCOUNTER — Encounter: Payer: Self-pay | Admitting: Family Medicine

## 2019-02-10 ENCOUNTER — Ambulatory Visit (INDEPENDENT_AMBULATORY_CARE_PROVIDER_SITE_OTHER): Payer: Medicare HMO | Admitting: Family Medicine

## 2019-02-10 ENCOUNTER — Other Ambulatory Visit: Payer: Self-pay

## 2019-02-10 VITALS — BP 148/82 | HR 82 | Temp 98.1°F | Resp 16 | Ht 65.0 in | Wt 217.0 lb

## 2019-02-10 DIAGNOSIS — R609 Edema, unspecified: Secondary | ICD-10-CM

## 2019-02-10 DIAGNOSIS — I10 Essential (primary) hypertension: Secondary | ICD-10-CM | POA: Diagnosis not present

## 2019-02-10 NOTE — Assessment & Plan Note (Signed)
Blood pressure is improving.  For now we will continue her on the Lasix 40 mg twice a day we will see if her renal function can tolerate this.  If her renal function is stable and I will have her get this rechecked in 1 month with a lab visit.  Continue all her other medications.  Continue to work on dietary changes decrease sodium in the diet.  Her edema has improved and her weight.

## 2019-02-10 NOTE — Progress Notes (Signed)
   Subjective:    Patient ID: Paula Long, female    DOB: [birth year], 68 y.o.   MRN: [id number]  Patient presents for Follow-up (is not fastng)  Pt here for intermin f/u on leg swelling and rash.  Her rash is pretty much resolved she still has some mild itching on her left upper chest but the swelling and redness has gone down.  Regarding her fluid retention her weight is down 4 pounds from last week.  She is currently on Lasix 40 mg twice a day.  Denies any leg cramps or side effects with the medication.  Her blood pressure is also improved states that he checked her blood pressure this morning was 145/70.  No new concerns   Review Of Systems:  GEN- denies fatigue, fever, weight loss,weakness, recent illness HEENT- denies eye drainage, change in vision, nasal discharge, CVS- denies chest pain, palpitations RESP- denies SOB, cough, wheeze ABD- denies N/V, change in stools, abd pain MSK- denies joint pain, muscle aches, injury Neuro- denies headache, dizziness, syncope, seizure activity       Objective:    BP (!) 148/82   Pulse 82   Temp 98.1 F (36.7 C) (Temporal)   Resp 16   Ht 5\' 5"  (1.651 m)   Wt 217 lb (98.4 kg)   SpO2 95%   BMI 36.11 kg/m  GEN- NAD, alert and oriented x3 Neck- Supple, no JVD CVS- RRR, no murmur RESP-CTAB EXT-Non pitting pedal edema Pulses- Radial  2+        Assessment & Plan:      Problem List Items Addressed This Visit      Unprioritized   Essential hypertension - Primary    Blood pressure is improving.  For now we will continue her on the Lasix 40 mg twice a day we will see if her renal function can tolerate this.  If her renal function is stable and I will have her get this rechecked in 1 month with a lab visit.  Continue all her other medications.  Continue to work on dietary changes decrease sodium in the diet.  Her edema has improved and her weight.      Relevant Orders   Basic metabolic panel   Peripheral edema   Relevant  Orders   Basic metabolic panel      Note: This dictation was prepared with Dragon dictation along with smaller phrase technology. Any transcriptional errors that result from this process are unintentional.

## 2019-02-10 NOTE — Patient Instructions (Addendum)
Continue lasix 40mg  twice a day  We will call with lab results F/U AS PREVIOUS

## 2019-02-11 LAB — BASIC METABOLIC PANEL
BUN/Creatinine Ratio: 15 (calc) (ref 6–22)
BUN: 22 mg/dL (ref 7–25)
CO2: 27 mmol/L (ref 20–32)
Calcium: 9.3 mg/dL (ref 8.6–10.4)
Chloride: 98 mmol/L (ref 98–110)
Creat: 1.49 mg/dL — ABNORMAL HIGH (ref 0.50–0.99)
Glucose, Bld: 101 mg/dL — ABNORMAL HIGH (ref 65–99)
Potassium: 4 mmol/L (ref 3.5–5.3)
Sodium: 137 mmol/L (ref 135–146)

## 2019-02-12 ENCOUNTER — Other Ambulatory Visit (HOSPITAL_BASED_OUTPATIENT_CLINIC_OR_DEPARTMENT_OTHER): Payer: Self-pay

## 2019-02-12 ENCOUNTER — Ambulatory Visit (HOSPITAL_COMMUNITY)
Admission: RE | Admit: 2019-02-12 | Discharge: 2019-02-12 | Disposition: A | Discharge status: Home / Self Care | Payer: Medicare HMO | Source: Ambulatory Visit | Attending: Family Medicine | Admitting: Family Medicine

## 2019-02-12 ENCOUNTER — Other Ambulatory Visit: Payer: Self-pay

## 2019-02-12 ENCOUNTER — Other Ambulatory Visit: Payer: Self-pay | Admitting: *Deleted

## 2019-02-12 DIAGNOSIS — G4733 Obstructive sleep apnea (adult) (pediatric): Secondary | ICD-10-CM

## 2019-02-12 DIAGNOSIS — G4714 Hypersomnia due to medical condition: Secondary | ICD-10-CM

## 2019-02-12 DIAGNOSIS — R7989 Other specified abnormal findings of blood chemistry: Secondary | ICD-10-CM

## 2019-02-12 DIAGNOSIS — Z1231 Encounter for screening mammogram for malignant neoplasm of breast: Secondary | ICD-10-CM | POA: Insufficient documentation

## 2019-02-17 ENCOUNTER — Other Ambulatory Visit: Payer: Self-pay

## 2019-02-17 ENCOUNTER — Other Ambulatory Visit (HOSPITAL_COMMUNITY)
Admission: RE | Admit: 2019-02-17 | Discharge: 2019-02-17 | Disposition: A | Discharge status: Home / Self Care | Payer: Medicare HMO | Source: Ambulatory Visit | Attending: Neurology | Admitting: Neurology

## 2019-02-17 DIAGNOSIS — Z20828 Contact with and (suspected) exposure to other viral communicable diseases: Secondary | ICD-10-CM | POA: Diagnosis not present

## 2019-02-17 DIAGNOSIS — Z01812 Encounter for preprocedural laboratory examination: Secondary | ICD-10-CM | POA: Insufficient documentation

## 2019-02-17 LAB — SARS CORONAVIRUS 2 (TAT 6-24 HRS): SARS Coronavirus 2: NEGATIVE

## 2019-02-19 ENCOUNTER — Other Ambulatory Visit: Payer: Self-pay

## 2019-02-19 ENCOUNTER — Ambulatory Visit: Payer: Medicare HMO | Attending: Neurology | Admitting: Neurology

## 2019-02-19 DIAGNOSIS — R0683 Snoring: Secondary | ICD-10-CM | POA: Insufficient documentation

## 2019-02-19 DIAGNOSIS — G4733 Obstructive sleep apnea (adult) (pediatric): Secondary | ICD-10-CM

## 2019-02-19 DIAGNOSIS — G4714 Hypersomnia due to medical condition: Secondary | ICD-10-CM | POA: Insufficient documentation

## 2019-02-19 DIAGNOSIS — Z79899 Other long term (current) drug therapy: Secondary | ICD-10-CM | POA: Diagnosis not present

## 2019-02-19 DIAGNOSIS — Z7982 Long term (current) use of aspirin: Secondary | ICD-10-CM | POA: Diagnosis not present

## 2019-02-20 NOTE — Procedures (Signed)
[place] A. [name], MD     [email or web address]             NOCTURNAL POLYSOMNOGRAPHY   LOCATION: [facility]   Patient Name: Paula Long, Paula Long Date: 02/19/2019 Gender: Female D.O.B: Jan 25, 1951 Age (years): 26 Referring Provider: Phillips Odor MD, ABSM Height (inches): 65 Interpreting Physician: Phillips Odor MD, ABSM Weight (lbs): 210 RPSGT: Peak, Robert BMI: 35 MRN: [id number] Neck Size: 15.00 CLINICAL INFORMATION Sleep Study Type: NPSG     Indication for sleep study: N/A     Epworth Sleepiness Score: 18      Most recent polysomnogram dated 08/12/2015 revealed an AHI of 1.2/h and RDI of 2.1/h. SLEEP STUDY TECHNIQUE As per the AASM Manual for the Scoring of Sleep and Associated Events v2.3 (April 2016) with a hypopnea requiring 4% desaturations.  The channels recorded and monitored were frontal, central and occipital EEG, electrooculogram (EOG), submentalis EMG (chin), nasal and oral airflow, thoracic and abdominal wall motion, anterior tibialis EMG, snore microphone, electrocardiogram, and pulse oximetry.  MEDICATIONS Medications self-administered by patient taken the night of the study : N/A  Current Outpatient Medications:  .  albuterol (PROVENTIL) (2.5 MG/3ML) 0.083% nebulizer solution, INHALE THE CONTENTS OF 1 VIAL VIA NEBULIZER EVERY 4 HOURS AS NEEDED FOR WHEEZING OR SHORTNESS OF BREATH (Patient taking differently: Take 2.5 mg by nebulization every 4 (four) hours as needed for wheezing or shortness of breath. ), Disp: 90 mL, Rfl: 1 .  albuterol (VENTOLIN HFA) 108 (90 Base) MCG/ACT inhaler, INHALE 2 PUFFS EVERY 4 HOURS AS NEEDED FOR WHEEZING OR SHORTNESS OF BREATH, Disp: 18 g, Rfl: 3 .  aspirin 325 MG tablet, Take 1 tablet (325 mg total) by mouth daily., Disp: 30 tablet, Rfl: 3 .  atorvastatin (LIPITOR) 10 MG tablet, TAKE 1 TABLET BY MOUTH ONCE DAILY AT 6:00 PM., Disp: 30 tablet, Rfl: 0 .  Clobetasol Prop Emollient Base 0.05 % emollient  cream, Apply to affected areas BID, Disp: 30 g, Rfl: 0 .  cyanocobalamin 1000 MCG tablet, Take 1 tablet (1,000 mcg total) by mouth daily., Disp: 30 tablet, Rfl: 3 .  desmopressin (DDAVP) 0.1 MG tablet, TAKE 1 TABLET AT BEDTIME FOR BLADDER, Disp: 90 tablet, Rfl: 3 .  DEXILANT 60 MG capsule, TAKE 1 CAPSULE EVERY DAY, Disp: 90 capsule, Rfl: 3 .  diltiazem (CARDIZEM) 120 MG tablet, Take 1 tablet (120 mg total) by mouth 2 (two) times daily., Disp: 180 tablet, Rfl: 3 .  EASYMAX TEST test strip, USE TO TEST ONCE DAILY., Disp: 100 each, Rfl: 3 .  furosemide (LASIX) 40 MG tablet, Take 1 tablet (40 mg total) by mouth 2 (two) times daily., Disp: 60 tablet, Rfl: 3 .  gabapentin (NEURONTIN) 400 MG capsule, Take 1 capsule (400 mg total) by mouth at bedtime., Disp: 90 capsule, Rfl: 3 .  hydrALAZINE (APRESOLINE) 50 MG tablet, Take 1 tablet (50 mg total) by mouth 3 (three) times daily., Disp: 270 tablet, Rfl: 3 .  ibuprofen (ADVIL) 600 MG tablet, TAKE 1 TABLET TWICE DAILY AS NEEDED, Disp: 180 tablet, Rfl: 1 .  nystatin (MYCOSTATIN/NYSTOP) powder, APPLY  TOPICALLY TWICE DAILY AS NEEDED (Patient taking differently: Apply 1 Bottle topically 2 (two) times daily as needed (irritation). APPLY  TOPICALLY TWICE DAILY AS NEEDED), Disp: 60 g, Rfl: 2 .  traZODone (DESYREL) 150 MG tablet, TAKE 1 TABLET AT BEDTIME, Disp: 90 tablet, Rfl: 3     SLEEP ARCHITECTURE The study was initiated at 9:52:12 PM and ended at 5:07:09 AM.  Sleep onset  time was 16.7 minutes and the sleep efficiency was 95.1%%. The total sleep time was 413.7 minutes.  Stage REM latency was 178.5 minutes.  The patient spent 1.2%% of the night in stage N1 sleep, 49.5%% in stage N2 sleep, 38.8%% in stage N3 and 10.5% in REM.  Alpha intrusion was absent.  Supine sleep was 4.24%.  RESPIRATORY PARAMETERS The overall apnea/hypopnea index (AHI) was 1.3 per hour. There were 6 total apneas, including 2 obstructive, 4 central and 0 mixed apneas. There were 3  hypopneas and 4 RERAs.  The AHI during Stage REM sleep was 2.8 per hour.  AHI while supine was 0.0 per hour.  The mean oxygen saturation was 89.4%. The minimum SpO2 during sleep was 85.0%.  soft snoring was noted during this study.  CARDIAC DATA The 2 lead EKG demonstrated sinus rhythm. The mean heart rate was 67.7 beats per minute. Other EKG findings include: None.  LEG MOVEMENT DATA The total PLMS were 0 with a resulting PLMS index of 0.0. Associated arousal with leg movement index was 0.0.  IMPRESSIONS No significant obstructive sleep apnea occurred during this study. No significant central sleep apnea occurred during this study.  Delano Metz, MD Diplomate, American Board of Sleep Medicine.  ELECTRONICALLY SIGNED ON:  02/20/2019, 10:28 PM [place] PH: [phone]   FX: [phone] [place]

## 2019-02-24 DIAGNOSIS — I6523 Occlusion and stenosis of bilateral carotid arteries: Secondary | ICD-10-CM | POA: Diagnosis not present

## 2019-02-24 DIAGNOSIS — G4714 Hypersomnia due to medical condition: Secondary | ICD-10-CM | POA: Diagnosis not present

## 2019-02-24 DIAGNOSIS — M461 Sacroiliitis, not elsewhere classified: Secondary | ICD-10-CM | POA: Diagnosis not present

## 2019-03-03 ENCOUNTER — Other Ambulatory Visit: Payer: Self-pay | Admitting: Family Medicine

## 2019-03-12 ENCOUNTER — Ambulatory Visit (INDEPENDENT_AMBULATORY_CARE_PROVIDER_SITE_OTHER): Payer: Medicare HMO | Admitting: Family Medicine

## 2019-03-12 ENCOUNTER — Other Ambulatory Visit: Payer: Self-pay

## 2019-03-12 DIAGNOSIS — N183 Chronic kidney disease, stage 3 unspecified: Secondary | ICD-10-CM

## 2019-03-12 DIAGNOSIS — R7989 Other specified abnormal findings of blood chemistry: Secondary | ICD-10-CM

## 2019-03-12 DIAGNOSIS — E1122 Type 2 diabetes mellitus with diabetic chronic kidney disease: Secondary | ICD-10-CM

## 2019-03-13 LAB — BASIC METABOLIC PANEL
BUN/Creatinine Ratio: 17 (calc) (ref 6–22)
BUN: 21 mg/dL (ref 7–25)
CO2: 26 mmol/L (ref 20–32)
Calcium: 9.2 mg/dL (ref 8.6–10.4)
Chloride: 99 mmol/L (ref 98–110)
Creat: 1.22 mg/dL — ABNORMAL HIGH (ref 0.50–0.99)
Glucose, Bld: 92 mg/dL (ref 65–99)
Potassium: 4.4 mmol/L (ref 3.5–5.3)
Sodium: 137 mmol/L (ref 135–146)

## 2019-03-17 ENCOUNTER — Telehealth: Payer: Self-pay | Admitting: *Deleted

## 2019-03-17 DIAGNOSIS — M7730 Calcaneal spur, unspecified foot: Secondary | ICD-10-CM

## 2019-03-17 NOTE — Telephone Encounter (Signed)
Orders placed.

## 2019-03-17 NOTE — Telephone Encounter (Signed)
Received call from patient.   Reports that she would like referral to podiatry for heel spurs. States that L>R.  Reports that she is currently seeing Dr. Irving Shows [place], but she would rather see someone in [place].   Ok to place referral?

## 2019-03-19 ENCOUNTER — Other Ambulatory Visit: Payer: Self-pay | Admitting: Family Medicine

## 2019-03-25 ENCOUNTER — Other Ambulatory Visit: Payer: Self-pay

## 2019-03-25 ENCOUNTER — Encounter: Payer: Self-pay | Admitting: Family Medicine

## 2019-03-25 ENCOUNTER — Ambulatory Visit (INDEPENDENT_AMBULATORY_CARE_PROVIDER_SITE_OTHER): Payer: Medicare HMO | Admitting: Family Medicine

## 2019-03-25 VITALS — BP 189/73 | HR 82 | Ht 65.0 in | Wt 221.6 lb

## 2019-03-25 DIAGNOSIS — R600 Localized edema: Secondary | ICD-10-CM | POA: Insufficient documentation

## 2019-03-25 DIAGNOSIS — Z720 Tobacco use: Secondary | ICD-10-CM | POA: Diagnosis not present

## 2019-03-25 DIAGNOSIS — R002 Palpitations: Secondary | ICD-10-CM | POA: Insufficient documentation

## 2019-03-25 DIAGNOSIS — I10 Essential (primary) hypertension: Secondary | ICD-10-CM | POA: Diagnosis not present

## 2019-03-25 MED ORDER — LOSARTAN POTASSIUM 25 MG PO TABS: 25.0000 mg | ORAL_TABLET | Freq: Every day | ORAL | 3 refills | Status: DC

## 2019-03-25 NOTE — Progress Notes (Addendum)
Cardiology Office Note  Date: 03/25/2019   ID: ELLAJEAN Long, DOB Apr 12, 1950, MRN [id number]  PCP:  Paula Rossetti, MD  Cardiologist:  Paula Dolly, MD Electrophysiologist:  None   Chief Complaint  Patient presents with  . Follow-up    History of Present Illness: Paula Long is a 69 y.o. female last seen in July 2020 for office visit with Dr. Harl Long.  Patient was complaining of palpitations that started a few months prior to that visit.  She described a fluttering in chest occurring a few times a week lasting only a few seconds and had some occasional shortness of breath.  She had a previous event monitor in January showing no evidence of arrhythmias.  She remained on diltiazem.  She described palpitations is ongoing and mostly occurring at night.  She reported elevated blood pressures in the 200s over 100s.  AM blood pressures prior to taking medications 190s over 90s.  She was seen in May 2020 in the emergency room for elevated blood pressure.  Norvasc had recently been started by another provider.  Chlorthalidone and lisinopril had been stopped due to acute kidney injury.  Creatinine trended down to 1.38 after stopping medications.  Recent creatinine 1.22 on March 12, 2019  She has carotid artery stenosis.  Seen on recent carotid arteries duplex study with bilateral ICA stenosis 50 to 69% in July 2020.  CT angiogram was recommended if clinically indicated.  Today she complains of persistently elevated blood pressures at home.  Currently taking hydralazine 50 mg 3 times daily, Cardizem 120 mg p.o. twice daily.  Patient states he was taking Lasix 40 mg twice a day but stopped taking the evening dose now only taking 40 mg daily.  Patient states she stopped taking the extra dose due to concerns over kidney function.  Recent creatinine was 1.22.  She does have some mild bilateral lower extremity edema.  States she smokes 2 packs cigarettes per day and has done so for  significant amount of years.  States she feels tired all the time.  Patient states she recently had cataract surgery bilaterally.  Complains of chronic back pain and has had previous back surgeries x2.  Believes elevated blood pressure could be partially attributed to back pain.  Past Medical History:  Diagnosis Date  . Anxiety   . Arthritis 12-07-11   arthritis,DDD,spinal stenosis  . Chronic back pain   . COPD (chronic obstructive pulmonary disease) ([place]) 12-07-11   pt. uses nebulizer as needed and inhaler daily  . Fibromyalgia    skin cancer  . GERD (gastroesophageal reflux disease)   . Hemorrhoids   . Hypertension 12-07-11   presently no meds  . Normal cardiac stress test 06/2009  . Shortness of breath 12-07-11   less now, occ wheezes  . Skin cancer    melanoma.  face 2014    Past Surgical History:  Procedure Laterality Date  . ABDOMINAL HYSTERECTOMY    . BACK SURGERY  12-07-11   2'10 L5 x2  . COLONOSCOPY WITH PROPOFOL N/A 06/03/2014   Procedure: COLONOSCOPY WITH PROPOFOL; IN CECUM AT 0750; TOTAL WITHDRAWAL TIME 16 MINUTES;  Surgeon: Rogene Houston, MD;  Location: [facility];  Service: Endoscopy;  Laterality: N/A;  . left ankle surgery     tendonitis  . LUMBAR LAMINECTOMY/DECOMPRESSION MICRODISCECTOMY  12/11/2011   Procedure: LUMBAR LAMINECTOMY/DECOMPRESSION MICRODISCECTOMY;  Surgeon: Tobi Bastos, MD;  Location: [facility];  Service: Orthopedics;  Laterality: Left;  Decompressive Lumbar Laminectomy L5-S1  on Left  . POLYPECTOMY N/A 06/03/2014   Procedure: POLYPECTOMY;  Surgeon: Rogene Houston, MD;  Location: [facility];  Service: Endoscopy;  Laterality: N/A;  . right wrist surgery for pinched nerve    . trigger finger surgery  12-07-11   rt. middle trigger finger release    Current Outpatient Medications  Medication Sig Dispense Refill  . albuterol (PROVENTIL) (2.5 MG/3ML) 0.083% nebulizer solution INHALE THE CONTENTS OF 1 VIAL VIA NEBULIZER EVERY 4 HOURS AS NEEDED FOR WHEEZING OR  SHORTNESS OF BREATH (Patient taking differently: Take 2.5 mg by nebulization every 4 (four) hours as needed for wheezing or shortness of breath. ) 90 mL 1  . albuterol (VENTOLIN HFA) 108 (90 Base) MCG/ACT inhaler INHALE 2 PUFFS EVERY 4 HOURS AS NEEDED FOR WHEEZING OR SHORTNESS OF BREATH 18 g 3  . aspirin 325 MG tablet Take 1 tablet (325 mg total) by mouth daily. 30 tablet 3  . atorvastatin (LIPITOR) 10 MG tablet TAKE 1 TABLET BY MOUTH ONCE DAILY AT 6:00 PM. 30 tablet 0  . Clobetasol Prop Emollient Base 0.05 % emollient cream Apply to affected areas BID 30 g 0  . desmopressin (DDAVP) 0.1 MG tablet TAKE 1 TABLET AT BEDTIME FOR BLADDER 90 tablet 3  . DEXILANT 60 MG capsule TAKE 1 CAPSULE EVERY DAY 90 capsule 3  . diltiazem (CARDIZEM) 120 MG tablet Take 1 tablet (120 mg total) by mouth 2 (two) times daily. 180 tablet 3  . EASYMAX TEST test strip USE TO TEST ONCE DAILY. 100 each 3  . furosemide (LASIX) 40 MG tablet Take 1 tablet (40 mg total) by mouth 2 (two) times daily. 60 tablet 3  . gabapentin (NEURONTIN) 400 MG capsule Take 1 capsule (400 mg total) by mouth at bedtime. 90 capsule 3  . hydrALAZINE (APRESOLINE) 50 MG tablet Take 1 tablet (50 mg total) by mouth 3 (three) times daily. 270 tablet 3  . ibuprofen (ADVIL) 600 MG tablet TAKE 1 TABLET TWICE DAILY AS NEEDED 180 tablet 1  . nystatin (MYCOSTATIN/NYSTOP) powder APPLY  TOPICALLY TWICE DAILY AS NEEDED (Patient taking differently: Apply 1 Bottle topically 2 (two) times daily as needed (irritation). APPLY  TOPICALLY TWICE DAILY AS NEEDED) 60 g 2  . traZODone (DESYREL) 150 MG tablet TAKE 1 TABLET AT BEDTIME 90 tablet 3  . losartan (COZAAR) 25 MG tablet Take 1 tablet (25 mg total) by mouth daily. 90 tablet 3   No current facility-administered medications for this visit.   Allergies:  Sulfa antibiotics, Nystatin, Percocet [oxycodone-acetaminophen], Vesicare [solifenacin], and Vicodin [hydrocodone-acetaminophen]   Social History: The patient  reports  that she has been smoking cigarettes. She started smoking about 49 years ago. She has a 96.00 pack-year smoking history. She has never used smokeless tobacco. She reports that she does not drink alcohol or use drugs.   Family History: The patient's family history includes Cancer in her father and mother.   ROS:  Please see the history of present illness. Otherwise, complete review of systems is positive for none.  All other systems are reviewed and negative.   Physical Exam: VS:  BP (!) 189/73   Pulse 82   Ht 5\' 5"  (1.651 m)   Wt 221 lb 9.6 oz (100.5 kg)   SpO2 96%   BMI 36.88 kg/m , BMI Body mass index is 36.88 kg/m.  Wt Readings from Last 3 Encounters:  03/25/19 221 lb 9.6 oz (100.5 kg)  02/10/19 217 lb (98.4 kg)  01/31/19 221 lb (100.2  kg)    General: Patient appears comfortable at rest. HEENT: Conjunctiva and lids normal, oropharynx clear with moist mucosa. Neck: Supple, no elevated JVP or carotid bruits, no thyromegaly. Lungs: Clear to auscultation, nonlabored breathing at rest. Cardiac: Regular rate and rhythm, no S3 or significant systolic murmur, no pericardial rub. Abdomen: Soft, nontender, no hepatomegaly, bowel sounds present, no guarding or rebound. Extremities: No pitting edema, distal pulses 2+. Skin: Warm and dry. Musculoskeletal: No kyphosis. Neuropsychiatric: Alert and oriented x3, affect grossly appropriate.  ECG:  An ECG dated September 30, 2018 was personally reviewed today and demonstrated:  Sinus or ectopic atrial rhythm rate of 62, short PR interval.  Recent Labwork: 09/29/2018: ALT 14; AST 15 09/30/2018: TSH 1.571 10/21/2018: Hemoglobin 12.3; Platelets 288 03/12/2019: BUN 21; Creat 1.22; Potassium 4.4; Sodium 137     Component Value Date/Time   CHOL 110 09/30/2018 0613   TRIG 37 09/30/2018 0613   HDL 49 09/30/2018 0613   CHOLHDL 2.2 09/30/2018 0613   VLDL 7 09/30/2018 0613   LDLCALC 54 09/30/2018 0613   LDLCALC 55 08/27/2018 1035    Other Studies  Reviewed Today:  Carotid artery duplex study September 30, 2018 IMPRESSION: Moderate to large amount of atherosclerotic plaque, left greater than right, results in elevated peak systolic velocities within the bilateral internal carotid arteries, compatible with the 50-69% luminal narrowing range bilaterally. Further evaluation with CTA could performed as clinically indicated.  Echocardiogram September 29, 2018  IMPRESSIONS  1. The left ventricle has hyperdynamic systolic function, with an ejection fraction of >65%. The cavity size was normal. There is mildly increased left ventricular wall thickness. Left ventricular diastolic Doppler parameters are consistent with  impaired relaxation. No evidence of left ventricular regional wall motion abnormalities.  2. The right ventricle has normal systolic function. The cavity was normal. There is no increase in right ventricular wall thickness.  3. Mild thickening of the aortic valve. Mild calcification of the aortic valve. Mild to moderate aortic annular calcification noted.  4. The mitral valve is grossly normal. There is mild mitral annular calcification present.  5. The aortic root is normal in size and structure.  6. No intracardiac thrombi or masses were visualized  Jan 2020 event monitor  14 day event monitor  Min HR 46, Max HR 123, Avg HR 73. Min HR occurred in very early AM hours presumably while sleeping  No symptoms reported  Telemetry tracings show sinus rhythm  No significant arrhythmias  Assessment and Plan:  1. Essential hypertension   2. Palpitations   3. Leg edema   4. Tobacco abuse     1. Essential hypertension Blood pressure remains elevated today in spite of being on hydralazine 50 mg 3 times daily and diltiazem 120 mg twice daily.  Add losartan 25 mg daily.  Have patient come back in 1 to 2 weeks for nursing visit to check blood pressure.  We may need to adjust then if blood pressure is not at goal.  Get BMP in 2  weeks.  2. Palpitations Patient denies any significant palpitations.  She states palpitations occur maybe every 2 months and are very transient and sporadic.  States she is asymptomatic when they occur.  Advised to decrease stimulant intake including smoking, caffeinated beverages.  3. Leg edema Patient does have mild lower extremity edema which is chronic.  States she recently decreased the dosage of her Lasix from 40 mg twice a day to 40 mg once a day.  Patient is concerned about her  renal function.  Recent creatinine 1.22 at PCP office.  Advised patient to take an extra dose as needed for swelling and abdominal bloating which occurs occasionally per her statement.  4. Tobacco abuse Continues to smoke at least 2 packs of cigarettes per day.  Highly encouraged patient to stop.  Patient states she has been smoking for significant amount of years but will attempt to cut down on the amount she smokes.   Medication Adjustments/Labs and Tests Ordered: Current medicines are reviewed at length with the patient today.  Concerns regarding medicines are outlined above.    Patient Instructions  Medication Instructions:   Your physician has recommended you make the following change in your medication:   Start losartan 25 mg by mouth daily  Continue other medications the same  Labwork:  NONE  Testing/Procedures:  NONE  Follow-Up:  Your physician recommends that you schedule a follow-up appointment in: 3 months.  Your physician recommends that you schedule a follow-up appointment in: 1-2 weeks for a nurse visit to have a blood pressure check  Any Other Special Instructions Will Be Listed Below (If Applicable).  If you need a refill on your cardiac medications before your next appointment, please call your pharmacy.        Signed, Levell July, NP 03/25/2019 3:10 PM    [facility] [place], [place], [place] [address] Phone: [phone]; Fax:  [phone]

## 2019-03-25 NOTE — Patient Instructions (Addendum)
Medication Instructions:   Your physician has recommended you make the following change in your medication:   Start losartan 25 mg by mouth daily  Continue other medications the same  Labwork:  NONE  Testing/Procedures:  NONE  Follow-Up:  Your physician recommends that you schedule a follow-up appointment in: 3 months.  Your physician recommends that you schedule a follow-up appointment in: 1-2 weeks for a nurse visit to have a blood pressure check  Any Other Special Instructions Will Be Listed Below (If Applicable).  If you need a refill on your cardiac medications before your next appointment, please call your pharmacy.

## 2019-03-31 ENCOUNTER — Other Ambulatory Visit: Payer: Self-pay | Admitting: Family Medicine

## 2019-04-03 ENCOUNTER — Other Ambulatory Visit: Payer: Self-pay

## 2019-04-03 ENCOUNTER — Ambulatory Visit: Payer: Medicare HMO | Admitting: *Deleted

## 2019-04-03 DIAGNOSIS — I10 Essential (primary) hypertension: Secondary | ICD-10-CM

## 2019-04-03 NOTE — Progress Notes (Signed)
Pt here for BP check after starting losartan 25 mg daily - denies any symptoms at this time - says BP this AM at home was 175/82 HR 82 - BP by nurse manual check was 162/68 HR 71 - will forward to provider

## 2019-04-10 ENCOUNTER — Other Ambulatory Visit: Payer: Self-pay | Admitting: *Deleted

## 2019-04-10 MED ORDER — GABAPENTIN 400 MG PO CAPS: 400.0000 mg | ORAL_CAPSULE | Freq: Every day | ORAL | 3 refills | Status: DC

## 2019-04-14 ENCOUNTER — Encounter: Payer: Self-pay | Admitting: Family Medicine

## 2019-04-14 ENCOUNTER — Ambulatory Visit (INDEPENDENT_AMBULATORY_CARE_PROVIDER_SITE_OTHER): Payer: Medicare HMO | Admitting: Family Medicine

## 2019-04-14 ENCOUNTER — Other Ambulatory Visit: Payer: Self-pay

## 2019-04-14 VITALS — BP 168/70 | HR 76 | Temp 97.9°F | Resp 18 | Ht 65.0 in | Wt 224.0 lb

## 2019-04-14 DIAGNOSIS — E1122 Type 2 diabetes mellitus with diabetic chronic kidney disease: Secondary | ICD-10-CM

## 2019-04-14 DIAGNOSIS — J418 Mixed simple and mucopurulent chronic bronchitis: Secondary | ICD-10-CM

## 2019-04-14 DIAGNOSIS — M545 Low back pain, unspecified: Secondary | ICD-10-CM

## 2019-04-14 DIAGNOSIS — J449 Chronic obstructive pulmonary disease, unspecified: Secondary | ICD-10-CM | POA: Diagnosis not present

## 2019-04-14 DIAGNOSIS — E1121 Type 2 diabetes mellitus with diabetic nephropathy: Secondary | ICD-10-CM

## 2019-04-14 DIAGNOSIS — R609 Edema, unspecified: Secondary | ICD-10-CM

## 2019-04-14 DIAGNOSIS — E7800 Pure hypercholesterolemia, unspecified: Secondary | ICD-10-CM | POA: Diagnosis not present

## 2019-04-14 DIAGNOSIS — G8929 Other chronic pain: Secondary | ICD-10-CM | POA: Diagnosis not present

## 2019-04-14 DIAGNOSIS — I10 Essential (primary) hypertension: Secondary | ICD-10-CM | POA: Diagnosis not present

## 2019-04-14 DIAGNOSIS — N183 Chronic kidney disease, stage 3 unspecified: Secondary | ICD-10-CM | POA: Diagnosis not present

## 2019-04-14 MED ORDER — DULOXETINE HCL 30 MG PO CPEP: 30.0000 mg | ORAL_CAPSULE | Freq: Every day | ORAL | 3 refills | Status: DC

## 2019-04-14 NOTE — Assessment & Plan Note (Signed)
He is going to schedule with her pain physician.  States that she is now on Cymbalta.  She likely needs this dose titrated upward with I will defer to them.

## 2019-04-14 NOTE — Progress Notes (Signed)
Subjective:    Patient ID: Paula Long, female    DOB: [birth year], 69 y.o.   MRN: [id number]  Patient presents for Follow-up   Pt here to f/u chronic medical problems  HTN- she was seen by cardiology 2 weeks ago, she is on dilitzaem 120mg  BID, hydralizine, added losatan 25mg  once a day   BP at home 125/61  This morning   Peripheral edema-  Taking lasix 60mg  once a day   Due for renal function   Last Creatinine  1.22  Hyperlipidemia- taking lipitor as prescribed  Chronic pain followed by pain clinic- she has appt Feb 1st with Dr. [name]  she has chronic back pain   she is on gabapentin     Weight up 7lbs , states she eats the same every day.   She doesn't eat breakfast   Eats a sandwhich with mayo every day  she does try to cook for supper, but does not eat seconds  Drinking mostly sprite     DM- currently diet controlled, last A1C 6.1% in July   Review Of Systems:  GEN- denies fatigue, fever, weight loss,weakness, recent illness HEENT- denies eye drainage, change in vision, nasal discharge, CVS- denies chest pain, palpitations RESP- denies SOB, cough, wheeze ABD- denies N/V, change in stools, abd pain GU- denies dysuria, hematuria, dribbling, incontinence MSK- denies joint pain, muscle aches, injury Neuro- denies headache, dizziness, syncope, seizure activity       Objective:    BP (!) 168/70   Pulse 76   Temp 97.9 F (36.6 C) (Other (Comment))   Resp 18   Ht 5\' 5"  (1.651 m)   Wt 224 lb (101.6 kg)   SpO2 96%   BMI 37.28 kg/m  GEN- NAD, alert and oriented x3 HEENT- PERRL, EOMI, non injected sclera, pink conjunctiva, MMM, oropharynx clear Neck- Supple, no thyromegaly CVS- RRR, no murmur RESP-CTAB ABD-NABS,soft,NT,ND EXT- pedal edema Pulses- Radial, DP- 2+        Assessment & Plan:      Problem List Items Addressed This Visit      Unprioritized   Chronic back pain    He is going to schedule with her pain physician.  States that she is  now on Cymbalta.  She likely needs this dose titrated upward with I will defer to them.      Relevant Medications   DULoxetine (CYMBALTA) 30 MG capsule   CKD stage 3 secondary to diabetes (HCC)   COPD (chronic obstructive pulmonary disease) ([facility])    No recent exacerbations.      Diabetes mellitus with nephropathy ([place])    With her weight gain we will reassess her A1c goal is less than 7%.      Relevant Orders   Hemoglobin A1c   Essential hypertension - Primary    Blood pressure elevated in the office.  She had recent adjustment in her medications.  She continue to monitor her blood pressure at home.  Continue with her diuretic.  We will recheck her renal function.  We will may be replaced with losartan dose depending on the result.      Relevant Orders   Comprehensive metabolic panel   CBC with Differential/Platelet   Hyperlipidemia   Peripheral edema    Recommend she restart her compression hose along with the Lasix 60 mg once a day. She does admit that her leg swelling is down first thing in the morning         Note: This dictation  was prepared with Dragon dictation along with smaller phrase technology. Any transcriptional errors that result from this process are unintentional.

## 2019-04-14 NOTE — Assessment & Plan Note (Signed)
Recommend she restart her compression hose along with the Lasix 60 mg once a day. She does admit that her leg swelling is down first thing in the morning

## 2019-04-14 NOTE — Assessment & Plan Note (Signed)
Blood pressure elevated in the office.  She had recent adjustment in her medications.  She continue to monitor her blood pressure at home.  Continue with her diuretic.  We will recheck her renal function.  We will may be replaced with losartan dose depending on the result.

## 2019-04-14 NOTE — Assessment & Plan Note (Signed)
No recent exacerbations.  

## 2019-04-14 NOTE — Assessment & Plan Note (Signed)
With her weight gain we will reassess her A1c goal is less than 7%.

## 2019-04-14 NOTE — Patient Instructions (Addendum)
Use the compression hose  We will call with lab results  F/U 4 months for physical

## 2019-04-15 LAB — CBC WITH DIFFERENTIAL/PLATELET
Absolute Monocytes: 727 cells/uL (ref 200–950)
Basophils Absolute: 83 cells/uL (ref 0–200)
Basophils Relative: 0.9 %
Eosinophils Absolute: 110 cells/uL (ref 15–500)
Eosinophils Relative: 1.2 %
HCT: 41.1 % (ref 35.0–45.0)
Hemoglobin: 13.7 g/dL (ref 11.7–15.5)
Lymphs Abs: 1831 cells/uL (ref 850–3900)
MCH: 30.7 pg (ref 27.0–33.0)
MCHC: 33.3 g/dL (ref 32.0–36.0)
MCV: 92.2 fL (ref 80.0–100.0)
MPV: 11.7 fL (ref 7.5–12.5)
Monocytes Relative: 7.9 %
Neutro Abs: 6449 cells/uL (ref 1500–7800)
Neutrophils Relative %: 70.1 %
Platelets: 304 10*3/uL (ref 140–400)
RBC: 4.46 10*6/uL (ref 3.80–5.10)
RDW: 13.4 % (ref 11.0–15.0)
Total Lymphocyte: 19.9 %
WBC: 9.2 10*3/uL (ref 3.8–10.8)

## 2019-04-15 LAB — COMPREHENSIVE METABOLIC PANEL
AG Ratio: 1.4 (calc) (ref 1.0–2.5)
ALT: 10 U/L (ref 6–29)
AST: 12 U/L (ref 10–35)
Albumin: 4.2 g/dL (ref 3.6–5.1)
Alkaline phosphatase (APISO): 85 U/L (ref 37–153)
BUN/Creatinine Ratio: 15 (calc) (ref 6–22)
BUN: 24 mg/dL (ref 7–25)
CO2: 27 mmol/L (ref 20–32)
Calcium: 9.7 mg/dL (ref 8.6–10.4)
Chloride: 97 mmol/L — ABNORMAL LOW (ref 98–110)
Creat: 1.61 mg/dL — ABNORMAL HIGH (ref 0.50–0.99)
Globulin: 3.1 g/dL (calc) (ref 1.9–3.7)
Glucose, Bld: 95 mg/dL (ref 65–99)
Potassium: 5.1 mmol/L (ref 3.5–5.3)
Sodium: 135 mmol/L (ref 135–146)
Total Bilirubin: 0.3 mg/dL (ref 0.2–1.2)
Total Protein: 7.3 g/dL (ref 6.1–8.1)

## 2019-04-15 LAB — HEMOGLOBIN A1C
Hgb A1c MFr Bld: 5.7 % of total Hgb — ABNORMAL HIGH (ref <5.7)
Mean Plasma Glucose: 117 (calc)
eAG (mmol/L): 6.5 (calc)

## 2019-04-15 NOTE — Addendum Note (Signed)
Addended by: Vic Blackbird F on: 04/15/2019 08:56 AM   Modules accepted: Orders

## 2019-04-17 ENCOUNTER — Telehealth: Payer: Self-pay | Admitting: *Deleted

## 2019-04-17 ENCOUNTER — Other Ambulatory Visit: Payer: Self-pay | Admitting: *Deleted

## 2019-04-17 DIAGNOSIS — I10 Essential (primary) hypertension: Secondary | ICD-10-CM

## 2019-04-17 MED ORDER — LOSARTAN POTASSIUM 25 MG PO TABS: 12.5000 mg | ORAL_TABLET | Freq: Every day | ORAL | 3 refills | Status: DC

## 2019-04-17 NOTE — Telephone Encounter (Signed)
Pt says Dr Fayne Norrie office can't do blood work and pt will go to [facility] for labs in 10 days - orders faxed to [facility] and in Epic

## 2019-04-17 NOTE — Addendum Note (Signed)
Addended by: [name] on: 04/17/2019 08:46 AM   Modules accepted: Orders

## 2019-04-17 NOTE — Telephone Encounter (Signed)
Patient informed to repeat BMET in 10 days. Patient verbalized understanding. Patient wants to have lab work done at her PCP office. Advised to contact PCP to arrange this.

## 2019-04-17 NOTE — Telephone Encounter (Signed)
-----   Message from Verta Ellen., NP sent at 04/16/2019 11:20 AM EST ----- Paula Long can you call patient and tell her to take half a pill of her losartan instead of a whole pill.  Her creatinine has increased to 1.61 from previous 1.22.  Just tell her to take half pill of losartan instead of whole pill.  Thank you

## 2019-04-17 NOTE — Telephone Encounter (Signed)
Yes. Thank you for reminding me. Hope everything is going well in [place]. Miss you guys.

## 2019-04-17 NOTE — Telephone Encounter (Signed)
Patient informed. 

## 2019-04-18 ENCOUNTER — Emergency Department (HOSPITAL_COMMUNITY): Payer: Medicare HMO

## 2019-04-18 ENCOUNTER — Emergency Department (HOSPITAL_COMMUNITY)
Admission: EM | Admit: 2019-04-18 | Discharge: 2019-04-18 | Disposition: A | Discharge status: Home / Self Care | Payer: Medicare HMO | Attending: Emergency Medicine | Admitting: Emergency Medicine

## 2019-04-18 ENCOUNTER — Other Ambulatory Visit: Payer: Self-pay | Admitting: *Deleted

## 2019-04-18 ENCOUNTER — Encounter (HOSPITAL_COMMUNITY): Payer: Self-pay

## 2019-04-18 ENCOUNTER — Other Ambulatory Visit: Payer: Self-pay

## 2019-04-18 DIAGNOSIS — I129 Hypertensive chronic kidney disease with stage 1 through stage 4 chronic kidney disease, or unspecified chronic kidney disease: Secondary | ICD-10-CM | POA: Diagnosis not present

## 2019-04-18 DIAGNOSIS — N183 Chronic kidney disease, stage 3 unspecified: Secondary | ICD-10-CM | POA: Diagnosis not present

## 2019-04-18 DIAGNOSIS — J449 Chronic obstructive pulmonary disease, unspecified: Secondary | ICD-10-CM | POA: Insufficient documentation

## 2019-04-18 DIAGNOSIS — R0789 Other chest pain: Secondary | ICD-10-CM | POA: Insufficient documentation

## 2019-04-18 DIAGNOSIS — Y93G1 Activity, food preparation and clean up: Secondary | ICD-10-CM | POA: Insufficient documentation

## 2019-04-18 DIAGNOSIS — W1839XA Other fall on same level, initial encounter: Secondary | ICD-10-CM | POA: Diagnosis not present

## 2019-04-18 DIAGNOSIS — Z7984 Long term (current) use of oral hypoglycemic drugs: Secondary | ICD-10-CM | POA: Diagnosis not present

## 2019-04-18 DIAGNOSIS — R079 Chest pain, unspecified: Secondary | ICD-10-CM | POA: Diagnosis not present

## 2019-04-18 DIAGNOSIS — F17210 Nicotine dependence, cigarettes, uncomplicated: Secondary | ICD-10-CM | POA: Diagnosis not present

## 2019-04-18 DIAGNOSIS — Y999 Unspecified external cause status: Secondary | ICD-10-CM | POA: Diagnosis not present

## 2019-04-18 DIAGNOSIS — S0993XA Unspecified injury of face, initial encounter: Secondary | ICD-10-CM | POA: Diagnosis not present

## 2019-04-18 DIAGNOSIS — R220 Localized swelling, mass and lump, head: Secondary | ICD-10-CM | POA: Diagnosis not present

## 2019-04-18 DIAGNOSIS — E1122 Type 2 diabetes mellitus with diabetic chronic kidney disease: Secondary | ICD-10-CM | POA: Diagnosis not present

## 2019-04-18 DIAGNOSIS — W19XXXA Unspecified fall, initial encounter: Secondary | ICD-10-CM | POA: Diagnosis not present

## 2019-04-18 DIAGNOSIS — R55 Syncope and collapse: Secondary | ICD-10-CM | POA: Diagnosis not present

## 2019-04-18 DIAGNOSIS — Y929 Unspecified place or not applicable: Secondary | ICD-10-CM | POA: Diagnosis not present

## 2019-04-18 DIAGNOSIS — S0990XA Unspecified injury of head, initial encounter: Secondary | ICD-10-CM | POA: Diagnosis not present

## 2019-04-18 DIAGNOSIS — I959 Hypotension, unspecified: Secondary | ICD-10-CM | POA: Diagnosis not present

## 2019-04-18 LAB — COMPREHENSIVE METABOLIC PANEL
ALT: 14 U/L (ref 0–44)
AST: 16 U/L (ref 15–41)
Albumin: 3.9 g/dL (ref 3.5–5.0)
Alkaline Phosphatase: 76 U/L (ref 38–126)
Anion gap: 11 (ref 5–15)
BUN: 22 mg/dL (ref 8–23)
CO2: 26 mmol/L (ref 22–32)
Calcium: 9 mg/dL (ref 8.9–10.3)
Chloride: 96 mmol/L — ABNORMAL LOW (ref 98–111)
Creatinine, Ser: 1.56 mg/dL — ABNORMAL HIGH (ref 0.44–1.00)
GFR calc Af Amer: 39 mL/min — ABNORMAL LOW (ref >60)
GFR calc non Af Amer: 34 mL/min — ABNORMAL LOW (ref >60)
Glucose, Bld: 117 mg/dL — ABNORMAL HIGH (ref 70–99)
Potassium: 4 mmol/L (ref 3.5–5.1)
Sodium: 133 mmol/L — ABNORMAL LOW (ref 135–145)
Total Bilirubin: 0.4 mg/dL (ref 0.3–1.2)
Total Protein: 7.8 g/dL (ref 6.5–8.1)

## 2019-04-18 LAB — CBC WITH DIFFERENTIAL/PLATELET
Abs Immature Granulocytes: 0.04 10*3/uL (ref 0.00–0.07)
Basophils Absolute: 0.1 10*3/uL (ref 0.0–0.1)
Basophils Relative: 1 %
Eosinophils Absolute: 0 10*3/uL (ref 0.0–0.5)
Eosinophils Relative: 0 %
HCT: 42.6 % (ref 36.0–46.0)
Hemoglobin: 13.9 g/dL (ref 12.0–15.0)
Immature Granulocytes: 0 %
Lymphocytes Relative: 11 %
Lymphs Abs: 1.2 10*3/uL (ref 0.7–4.0)
MCH: 30.5 pg (ref 26.0–34.0)
MCHC: 32.6 g/dL (ref 30.0–36.0)
MCV: 93.4 fL (ref 80.0–100.0)
Monocytes Absolute: 0.5 10*3/uL (ref 0.1–1.0)
Monocytes Relative: 5 %
Neutro Abs: 8.8 10*3/uL — ABNORMAL HIGH (ref 1.7–7.7)
Neutrophils Relative %: 83 %
Platelets: 302 10*3/uL (ref 150–400)
RBC: 4.56 MIL/uL (ref 3.87–5.11)
RDW: 14.7 % (ref 11.5–15.5)
WBC: 10.7 10*3/uL — ABNORMAL HIGH (ref 4.0–10.5)
nRBC: 0 % (ref 0.0–0.2)

## 2019-04-18 LAB — TROPONIN I (HIGH SENSITIVITY): Troponin I (High Sensitivity): 8 ng/L (ref <18)

## 2019-04-18 MED ORDER — SODIUM CHLORIDE 0.9 % IV BOLUS
500.0000 mL | Freq: Once | INTRAVENOUS | Status: AC
  Administered 2019-04-18: 500 mL via INTRAVENOUS

## 2019-04-18 MED ORDER — GABAPENTIN 400 MG PO CAPS: 400.0000 mg | ORAL_CAPSULE | Freq: Every day | ORAL | 3 refills | Status: DC

## 2019-04-18 NOTE — Progress Notes (Addendum)
Pt voiced understanding - says she didn't return call was in ED Friday for Syncope and collapse is f/u with Dr Buelah Manis - says she has been off the losartan for the last week - will increase hydralazine

## 2019-04-18 NOTE — Discharge Instructions (Addendum)
Follow-up with your doctor next week for recheck 

## 2019-04-18 NOTE — ED Triage Notes (Addendum)
Pt was washing dishes and right chest started hurting pt sat in chair and passed out fell face forward. Pt has hematoma above right eye. Pt was given nitro by EMS with no change in chest pain. Pt states seeing black spots in right eye. Pt has been on new BP med for 2 weeks and had changes in kidney function and was told to DC. Last dose yesterday

## 2019-04-18 NOTE — Progress Notes (Signed)
Worsening renal function since starting losarta, please stop. BP's remain elevated, increase hydralazine to 75mg  tid. Should have virtual f/u with me 3 weeks   Zandra Abts MD

## 2019-04-21 ENCOUNTER — Other Ambulatory Visit (HOSPITAL_COMMUNITY): Payer: Self-pay | Admitting: Neurology

## 2019-04-21 ENCOUNTER — Other Ambulatory Visit: Payer: Self-pay | Admitting: Neurology

## 2019-04-21 DIAGNOSIS — R55 Syncope and collapse: Secondary | ICD-10-CM | POA: Diagnosis not present

## 2019-04-21 DIAGNOSIS — I6523 Occlusion and stenosis of bilateral carotid arteries: Secondary | ICD-10-CM

## 2019-04-21 DIAGNOSIS — G4714 Hypersomnia due to medical condition: Secondary | ICD-10-CM | POA: Diagnosis not present

## 2019-04-21 DIAGNOSIS — M461 Sacroiliitis, not elsewhere classified: Secondary | ICD-10-CM | POA: Diagnosis not present

## 2019-04-21 MED ORDER — HYDRALAZINE HCL 50 MG PO TABS: 75.0000 mg | ORAL_TABLET | Freq: Three times a day (TID) | ORAL | 6 refills | Status: DC

## 2019-04-21 NOTE — Addendum Note (Signed)
Addended by: Julian Hy T on: 04/21/2019 11:34 AM   Modules accepted: Orders

## 2019-04-21 NOTE — ED Provider Notes (Signed)
[facility] Provider Note   CSN: [id number] Arrival date & time: 04/18/19  1057     History Chief Complaint  Patient presents with  . Loss of Consciousness    Paula Long is a 69 y.o. female.  Patient states that she was washing dishes and had some chest discomfort she sat down and passed out.  Patient hit her face.  Patient no longer has any symptoms  The history is provided by the patient and medical records. No language interpreter was used.  Loss of Consciousness Episode history:  Single Most recent episode:  Today Timing:  Unable to specify Chronicity:  New Context: not blood draw   Witnessed: yes   Relieved by:  Nothing Worsened by:  Nothing Associated symptoms: dizziness   Associated symptoms: no chest pain, no headaches and no seizures        Past Medical History:  Diagnosis Date  . Anxiety   . Arthritis 12-07-11   arthritis,DDD,spinal stenosis  . Chronic back pain   . COPD (chronic obstructive pulmonary disease) ([place]) 12-07-11   pt. uses nebulizer as needed and inhaler daily  . Fibromyalgia    skin cancer  . GERD (gastroesophageal reflux disease)   . Hemorrhoids   . Hypertension 12-07-11   presently no meds  . Normal cardiac stress test 06/2009  . Shortness of breath 12-07-11   less now, occ wheezes  . Skin cancer    melanoma.  face 2014    Patient Active Problem List   Diagnosis Date Noted  . Leg edema 03/25/2019  . Palpitations 03/25/2019  . Memory changes 12/13/2018  . B12 deficiency 12/13/2018  . Carotid artery disease ([place]) 10/08/2018  . Vision changes   . Weakness   . Diabetes mellitus with nephropathy ([place])   . CKD stage 3 secondary to diabetes ([place])   . Hyperlipidemia   . TIA (transient ischemic attack) 09/29/2018  . Peripheral edema 09/04/2018  . Depression with anxiety 05/22/2014  . Family hx of colon cancer 02/19/2014  . IBS (irritable bowel syndrome) 01/23/2014  . Urinary incontinence, mixed 10/13/2013   . Chronic insomnia 09/12/2013  . Boil 02/27/2013  . Skin lesion of face 10/22/2012  . Hemorrhoids 10/22/2012  . Intertrigo 06/21/2012  . Neck pain on left side 06/21/2012  . Bunion of great toe 06/21/2012  . OAB (overactive bladder) 03/15/2012  . Spinal stenosis, lumbar region, with neurogenic claudication 12/11/2011  . Essential hypertension 10/30/2011  . Chronic back pain 10/30/2011  . GERD (gastroesophageal reflux disease) 10/30/2011  . COPD (chronic obstructive pulmonary disease) ([place]) 10/30/2011  . Tobacco user 10/30/2011  . Obesity 10/30/2011  . Abnormal mammogram 10/30/2011    Past Surgical History:  Procedure Laterality Date  . ABDOMINAL HYSTERECTOMY    . BACK SURGERY  12-07-11   2'10 L5 x2  . COLONOSCOPY WITH PROPOFOL N/A 06/03/2014   Procedure: COLONOSCOPY WITH PROPOFOL; IN CECUM AT 0750; TOTAL WITHDRAWAL TIME 16 MINUTES;  Surgeon: Rogene Houston, MD;  Location: [facility];  Service: Endoscopy;  Laterality: N/A;  . left ankle surgery     tendonitis  . LUMBAR LAMINECTOMY/DECOMPRESSION MICRODISCECTOMY  12/11/2011   Procedure: LUMBAR LAMINECTOMY/DECOMPRESSION MICRODISCECTOMY;  Surgeon: Tobi Bastos, MD;  Location: [facility];  Service: Orthopedics;  Laterality: Left;  Decompressive Lumbar Laminectomy L5-S1 on Left  . POLYPECTOMY N/A 06/03/2014   Procedure: POLYPECTOMY;  Surgeon: Rogene Houston, MD;  Location: [facility];  Service: Endoscopy;  Laterality: N/A;  . right wrist surgery for  pinched nerve    . trigger finger surgery  12-07-11   rt. middle trigger finger release     OB History    Gravida  3   Para  2   Term  2   Preterm      AB  1   Living        SAB  1   TAB      Ectopic      Multiple      Live Births              Family History  Problem Relation Age of Onset  . Cancer Mother        uterine  . Cancer Father        lung    Social History   Tobacco Use  . Smoking status: Current Every Day Smoker    Packs/day: 2.00    Years: 48.00     Pack years: 96.00    Types: Cigarettes    Start date: 01/07/1970  . Smokeless tobacco: Never Used  . Tobacco comment: since age 51. smoking 2-3 packs a day  Substance Use Topics  . Alcohol use: No    Alcohol/week: 0.0 standard drinks  . Drug use: No    Home Medications Prior to Admission medications   Medication Sig Start Date End Date Taking? Authorizing Provider  albuterol (PROVENTIL) (2.5 MG/3ML) 0.083% nebulizer solution INHALE THE CONTENTS OF 1 VIAL VIA NEBULIZER EVERY 4 HOURS AS NEEDED FOR WHEEZING OR SHORTNESS OF BREATH Patient taking differently: Take 2.5 mg by nebulization every 4 (four) hours as needed for wheezing or shortness of breath.  09/18/16  Yes [place], Modena Nunnery, MD  albuterol (VENTOLIN HFA) 108 (90 Base) MCG/ACT inhaler INHALE 2 PUFFS EVERY 4 HOURS AS NEEDED FOR WHEEZING OR SHORTNESS OF BREATH Patient taking differently: Inhale 2 puffs into the lungs every 4 (four) hours as needed for wheezing or shortness of breath.  03/19/19  Yes [place], Modena Nunnery, MD  aspirin 325 MG tablet Take 1 tablet (325 mg total) by mouth daily. 10/02/18  Yes Barton Dubois, MD  atorvastatin (LIPITOR) 10 MG tablet TAKE 1 TABLET BY MOUTH ONCE DAILY AT 6:00 PM. Patient taking differently: Take 10 mg by mouth daily.  03/31/19  Yes [place], Modena Nunnery, MD  Clobetasol Prop Emollient Base 0.05 % emollient cream Apply to affected areas BID 01/31/19  Yes [place], Modena Nunnery, MD  desmopressin (DDAVP) 0.1 MG tablet TAKE 1 TABLET AT BEDTIME FOR BLADDER Patient taking differently: Take 0.1 mg by mouth at bedtime. For bladder 11/13/18  Yes [place], Modena Nunnery, MD  DEXILANT 60 MG capsule TAKE 1 CAPSULE EVERY DAY 07/09/18  Yes [place], Modena Nunnery, MD  diltiazem (CARDIZEM) 120 MG tablet Take 1 tablet (120 mg total) by mouth 2 (two) times daily. 09/27/18  Yes Branch[place] Guild, MD  DULoxetine (CYMBALTA) 30 MG capsule Take 1 capsule (30 mg total) by mouth daily. 04/14/19  Yes [place], Modena Nunnery, MD  [facility] TEST test strip USE  TO TEST ONCE DAILY. Patient taking differently: See admin instructions. USE TO TEST ONCE DAILY 06/03/15  Yes Susy Frizzle, MD  furosemide (LASIX) 40 MG tablet Take 1 tablet (40 mg total) by mouth 2 (two) times daily. 01/31/19  Yes [place], Modena Nunnery, MD  gabapentin (NEURONTIN) 400 MG capsule Take 1 capsule (400 mg total) by mouth at bedtime. 04/18/19  Yes [place], Modena Nunnery, MD  hydrALAZINE (APRESOLINE) 50 MG tablet Take 1 tablet (50 mg  total) by mouth 3 (three) times daily. 09/27/18  Yes Arnoldo Lenis, MD  ibuprofen (ADVIL) 600 MG tablet TAKE 1 TABLET TWICE DAILY AS NEEDED 02/05/19  Yes [place], Modena Nunnery, MD  losartan (COZAAR) 25 MG tablet Take 0.5 tablets (12.5 mg total) by mouth daily. 04/17/19 07/16/19 Yes Verta Ellen., NP  nystatin (MYCOSTATIN/NYSTOP) powder APPLY  TOPICALLY TWICE DAILY AS NEEDED Patient taking differently: Apply 1 Bottle topically 2 (two) times daily as needed (irritation). APPLY  TOPICALLY TWICE DAILY AS NEEDED 07/29/18  Yes [place], Modena Nunnery, MD  traZODone (DESYREL) 150 MG tablet TAKE 1 TABLET AT BEDTIME 08/28/18  Yes [place], Modena Nunnery, MD    Allergies    Sulfa antibiotics, Nystatin, Percocet [oxycodone-acetaminophen], Vesicare [solifenacin], and Vicodin [hydrocodone-acetaminophen]  Review of Systems   Review of Systems  Constitutional: Negative for appetite change and fatigue.  HENT: Negative for congestion, ear discharge and sinus pressure.   Eyes: Negative for discharge.  Respiratory: Negative for cough.   Cardiovascular: Positive for syncope. Negative for chest pain.  Gastrointestinal: Negative for abdominal pain and diarrhea.  Genitourinary: Negative for frequency and hematuria.  Musculoskeletal: Negative for back pain.  Skin: Negative for rash.  Neurological: Positive for dizziness. Negative for seizures and headaches.  Psychiatric/Behavioral: Negative for hallucinations.    Physical Exam Updated Vital Signs BP (!) 171/56   Pulse 68   Temp 97.8  F (36.6 C) (Oral)   Resp 19   Ht 5\' 5"  (1.651 m)   Wt 101.6 kg   SpO2 99%   BMI 37.27 kg/m   Physical Exam Vitals and nursing note reviewed.  Constitutional:      Appearance: She is well-developed.  HENT:     Head: Normocephalic.     Nose: Nose normal.  Eyes:     General: No scleral icterus.    Conjunctiva/sclera: Conjunctivae normal.  Neck:     Thyroid: No thyromegaly.  Cardiovascular:     Rate and Rhythm: Normal rate and regular rhythm.     Heart sounds: No murmur. No friction rub. No gallop.   Pulmonary:     Breath sounds: No stridor. No wheezing or rales.  Chest:     Chest wall: No tenderness.  Abdominal:     General: There is no distension.     Tenderness: There is no abdominal tenderness. There is no rebound.  Musculoskeletal:        General: Normal range of motion.     Cervical back: Neck supple.  Lymphadenopathy:     Cervical: No cervical adenopathy.  Skin:    Findings: No erythema or rash.  Neurological:     Mental Status: She is oriented to person, place, and time.     Motor: No abnormal muscle tone.     Coordination: Coordination normal.  Psychiatric:        Behavior: Behavior normal.     ED Results / Procedures / Treatments   Labs (all labs ordered are listed, but only abnormal results are displayed) Labs Reviewed  CBC WITH DIFFERENTIAL/PLATELET - Abnormal; Notable for the following components:      Result Value   WBC 10.7 (*)    Neutro Abs 8.8 (*)    All other components within normal limits  COMPREHENSIVE METABOLIC PANEL - Abnormal; Notable for the following components:   Sodium 133 (*)    Chloride 96 (*)    Glucose, Bld 117 (*)    Creatinine, Ser 1.56 (*)    GFR calc non Af  Amer 34 (*)    GFR calc Af Amer 39 (*)    All other components within normal limits  TROPONIN I (HIGH SENSITIVITY)    EKG EKG Interpretation  Date/Time:  Friday April 18 2019 11:15:25 EST Ventricular Rate:  65 PR Interval:    QRS Duration: 90 QT  Interval:  427 QTC Calculation: 444 R Axis:   40 Text Interpretation: Sinus rhythm Abnormal R-wave progression, early transition Confirmed by Milton Ferguson [phone]) on 04/18/2019 3:20:00 PM   Radiology No results found.  Procedures Procedures (including critical care time)  Medications Ordered in ED Medications  sodium chloride 0.9 % bolus 500 mL (0 mLs Intravenous Stopped 04/18/19 1546)    ED Course  I have reviewed the triage vital signs and the nursing notes.  Pertinent labs & imaging results that were available during my care of the patient were reviewed by me and considered in my medical decision making (see chart for details).    MDM Rules/Calculators/A&P                     Labs and CT scan of head and face unremarkable.  EKG shows no acute changes.  Patient with syncopal episode.  She will be discharged home and will follow up in the next few days with her doctor Final Clinical Impression(s) / ED Diagnoses Final diagnoses:  Syncope and collapse    Rx / DC Orders ED Discharge Orders    None       Milton Ferguson, MD 04/21/19 [id number]

## 2019-04-22 ENCOUNTER — Encounter: Payer: Self-pay | Admitting: Family Medicine

## 2019-04-22 ENCOUNTER — Ambulatory Visit (INDEPENDENT_AMBULATORY_CARE_PROVIDER_SITE_OTHER): Payer: Medicare HMO | Admitting: Family Medicine

## 2019-04-22 ENCOUNTER — Other Ambulatory Visit: Payer: Self-pay

## 2019-04-22 VITALS — BP 174/56 | HR 96 | Temp 97.9°F | Resp 16 | Ht 65.0 in | Wt 222.0 lb

## 2019-04-22 DIAGNOSIS — I10 Essential (primary) hypertension: Secondary | ICD-10-CM

## 2019-04-22 DIAGNOSIS — R55 Syncope and collapse: Secondary | ICD-10-CM | POA: Diagnosis not present

## 2019-04-22 DIAGNOSIS — S0011XD Contusion of right eyelid and periocular area, subsequent encounter: Secondary | ICD-10-CM

## 2019-04-22 NOTE — Assessment & Plan Note (Signed)
Significant hypertension.  She is not orthostatic but her blood pressure is very labile.  I think one of the main issues today is that she has not had the losartan and is still been taking the lower dose of the hydralazine.  She has not had her mid dose of hydralazine today she is going to take this as soon as she leaves.  She will continue the Cardizem 120 mg twice a day as well.  I am concerned that she may have renal artery stenosis NSAIDs 3 times she is placed on an ACE or ARB her creatinine increases significantly.  Renal ultrasound has been ordered and this will be done on February 11.  With regards to the heaviness in her right arm and the severe chest discomfort I would like to have her evaluated by her cardiologist again.  Not sure if she needs stress testing or even another event monitor since it is unclear why she had a syncopal episode.  She is also scheduled by neurology to have carotid Dopplers done next week for her carotid artery disease.

## 2019-04-22 NOTE — Patient Instructions (Addendum)
Call [place]- [phone] Take 1.5 tablets of the hydralazine when you get home Kidney Ultrasound will be Feb 11th at 8:15am  F/U as previous

## 2019-04-22 NOTE — Progress Notes (Signed)
Orthostatic BP:  HR:  SpO2:  Lying:   158/ 64 90  95  Sitting:  160/ 72 92  96  Standing:  152/ 70 96  97  Standing x2: 180/ 64 96  98

## 2019-04-22 NOTE — Progress Notes (Signed)
Subjective:    Patient ID: Paula Long, female    DOB: [birth year], 69 y.o.   MRN: [id number]  Patient presents for ER F/U (syncope)   Pt here for ER follow up. She was seen last week to f/u HTN and chronic medical problems. She had been started on losartan by cardiology for HTN, however this worsened her renal function.  The note states that she was supposed to take a half a tablet but she states that the nurse told her to just stop it altogether.  She therefore was just maintained on her hydralazine 50 mg 3 times a day and her Cardizem 120 mg twice a day.  Friday she was up at the sink trying to wash dishes.  She started having right-sided chest discomfort that was severe.  She tried to get sit down at the table and the next thing she knew she had passed out.  Her roommate was unable to arouse her and called 911.  She was transferred to the emergency room.  CT of head did not show acute stroke she has soft tissue swelling over her face where she currently has a black eye right greater than left.  EKG was unremarkable.  Her troponins were negative.  Her creatinine had mildly improved at 1.56.  She was discharged home with no changes in medication.  Cardiology did see her discharge note and advised her to increase her hydralazine to 75 mg 3 times a day but she has not done this yet.  She tells me that she has been having heaviness into her right arm for the past few weeks but she did not tell anyone.  She has not had any other severe episodes of chest pain like what she experienced Friday.  Is been no change in her breathing.  Today she denies any chest pain.  She does not have any significant headache. Review Of Systems:  GEN- denies fatigue, fever, weight loss,weakness, recent illness HEENT- denies eye drainage, change in vision, nasal discharge, CVS- denies chest pain, palpitations RESP- denies SOB, cough, wheeze ABD- denies N/V, change in stools, abd pain GU- denies dysuria, hematuria,  dribbling, incontinence MSK- denies joint pain, muscle aches, injury Neuro- denies headache, dizziness, syncope, seizure activity       Objective:    BP (!) 174/56 (BP Location: Right Arm, Patient Position: Sitting, Cuff Size: Normal)   Pulse 96   Temp 97.9 F (36.6 C) (Temporal)   Resp 16   Ht 5\' 5"  (1.651 m)   Wt 222 lb (100.7 kg)   SpO2 98%   BMI 36.94 kg/m  GEN- NAD, alert and oriented x3 HEENT- PERRL, EOMI, non injected sclera, pink conjunctiva, MMM, oropharynx clear , right black eye, forehead with soft tissue swelling and bruising towards the left side. Neck- Supple, CVS- RRR, no murmur RESP-CTAB Neurocranial nerves II through XII grossly intact EXT- No edema Pulses- Radial, 2+        Assessment & Plan:      Problem List Items Addressed This Visit      Unprioritized   Essential hypertension    Significant hypertension.  She is not orthostatic but her blood pressure is very labile.  I think one of the main issues today is that she has not had the losartan and is still been taking the lower dose of the hydralazine.  She has not had her mid dose of hydralazine today she is going to take this as soon as she leaves.  She will  continue the Cardizem 120 mg twice a day as well.  I am concerned that she may have renal artery stenosis NSAIDs 3 times she is placed on an ACE or ARB her creatinine increases significantly.  Renal ultrasound has been ordered and this will be done on February 11.  With regards to the heaviness in her right arm and the severe chest discomfort I would like to have her evaluated by her cardiologist again.  Not sure if she needs stress testing or even another event monitor since it is unclear why she had a syncopal episode.  She is also scheduled by neurology to have carotid Dopplers done next week for her carotid artery disease.       Other Visit Diagnoses    Syncope and collapse    -  Primary   Black eye of right side, subsequent encounter           Note: This dictation was prepared with Dragon dictation along with smaller phrase technology. Any transcriptional errors that result from this process are unintentional.

## 2019-04-23 ENCOUNTER — Other Ambulatory Visit: Payer: Self-pay | Admitting: Family Medicine

## 2019-04-23 ENCOUNTER — Encounter: Payer: Self-pay | Admitting: Cardiology

## 2019-04-23 ENCOUNTER — Ambulatory Visit: Payer: Medicare HMO | Admitting: Cardiology

## 2019-04-23 ENCOUNTER — Ambulatory Visit (INDEPENDENT_AMBULATORY_CARE_PROVIDER_SITE_OTHER): Payer: Medicare HMO

## 2019-04-23 VITALS — BP 155/66 | HR 70 | Temp 98.6°F | Ht 65.0 in | Wt 223.0 lb

## 2019-04-23 DIAGNOSIS — R55 Syncope and collapse: Secondary | ICD-10-CM | POA: Diagnosis not present

## 2019-04-23 DIAGNOSIS — I10 Essential (primary) hypertension: Secondary | ICD-10-CM

## 2019-04-23 NOTE — Progress Notes (Signed)
Clinical Summary Paula Long is a 69 y.o.female seen today for follow up of the following medical problems.  1.Palpitations - started a few months ago - fluttering in chest, occurs few times week. Lasts a few seconds.Some SOB at times  - Jan 2020 event monitor no symptoms reported, no arrhythmias - has been started on diltiazem in the past for both palpitatoins and HTN  - no recent palpitations   2. HTN - high bp's recently up to 200s/100s. First thing in AM 193/93 before meds. Later 225/105, then 186/90 and 168/70. HRs 70-80s - seen in ER 08/07/18 with elevated bp's. - recently restarted on norvasc by another provider   - chlrothalidone and lisinopril stopped due to AKI. Cr from 0.97-->1.3-->1.56 on 6/17. K was up to 5.6.  - after stoppring Cr trended down to 1.38, K 4.6    - started on losartan 25mg  during visit with PA Jan 5,2021 for high bp's - uptrend in Cr similar to what she had on lisinopril in the past, we stopped losartan and increased hydralazine -she  just increased hydral to 75mg  tid yesterday    3. Syncope - seen in ER Apr 18, 2019 - labs Hgb 13.9 K 4 Cr 1.56 Trop 8 - EKG NSR  - episodel occurred at home. Felt fine that morning.  - got up off the cough to go to kitchen. Was washing dishes.  - was leaning over sink. Had some chest pain, right sharp. No prodrome. Struck face.  - went and sat down at kitchen table. Next thing she remembers ambulace was helping her out. - no prior episodes of blacking out - seen by Paula Long witho ongong workup.  Past Medical History:  Diagnosis Date  . Anxiety   . Arthritis 12-07-11   arthritis,DDD,spinal stenosis  . Chronic back pain   . COPD (chronic obstructive pulmonary disease) ([place]) 12-07-11   pt. uses nebulizer as needed and inhaler daily  . Fibromyalgia    skin cancer  . GERD (gastroesophageal reflux disease)   . Hemorrhoids   . Hypertension 12-07-11   presently no meds  . Normal cardiac stress  test 06/2009  . Shortness of breath 12-07-11   less now, occ wheezes  . Skin cancer    melanoma.  face 2014     Allergies  Allergen Reactions  . Sulfa Antibiotics   . Nystatin Rash    Oral rash  . Percocet [Oxycodone-Acetaminophen] Rash    Oral rash  . Vesicare [Solifenacin] Rash  . Vicodin [Hydrocodone-Acetaminophen] Rash    Oral rash     Current Outpatient Medications  Medication Sig Dispense Refill  . albuterol (PROVENTIL) (2.5 MG/3ML) 0.083% nebulizer solution INHALE THE CONTENTS OF 1 VIAL VIA NEBULIZER EVERY 4 HOURS AS NEEDED FOR WHEEZING OR SHORTNESS OF BREATH (Patient taking differently: Take 2.5 mg by nebulization every 4 (four) hours as needed for wheezing or shortness of breath. ) 90 mL 1  . albuterol (VENTOLIN HFA) 108 (90 Base) MCG/ACT inhaler INHALE 2 PUFFS EVERY 4 HOURS AS NEEDED FOR WHEEZING OR SHORTNESS OF BREATH (Patient taking differently: Inhale 2 puffs into the lungs every 4 (four) hours as needed for wheezing or shortness of breath. ) 18 g 3  . aspirin 325 MG tablet Take 1 tablet (325 mg total) by mouth daily. 30 tablet 3  . atorvastatin (LIPITOR) 10 MG tablet TAKE 1 TABLET BY MOUTH ONCE DAILY AT 6:00 PM. (Patient taking differently: Take 10 mg by mouth daily. ) 30 tablet  2  . Clobetasol Prop Emollient Base 0.05 % emollient cream Apply to affected areas BID 30 g 0  . desmopressin (DDAVP) 0.1 MG tablet TAKE 1 TABLET AT BEDTIME FOR BLADDER (Patient taking differently: Take 0.1 mg by mouth at bedtime. For bladder) 90 tablet 3  . DEXILANT 60 MG capsule TAKE 1 CAPSULE EVERY DAY 90 capsule 3  . diltiazem (CARDIZEM) 120 MG tablet Take 1 tablet (120 mg total) by mouth 2 (two) times daily. 180 tablet 3  . DULoxetine (CYMBALTA) 30 MG capsule Take 1 capsule (30 mg total) by mouth daily. 30 capsule 3  . EASYMAX TEST test strip USE TO TEST ONCE DAILY. (Patient taking differently: See admin instructions. USE TO TEST ONCE DAILY) 100 each 3  . furosemide (LASIX) 40 MG tablet Take  1 tablet (40 mg total) by mouth 2 (two) times daily. 60 tablet 3  . gabapentin (NEURONTIN) 400 MG capsule Take 1 capsule (400 mg total) by mouth at bedtime. 90 capsule 3  . hydrALAZINE (APRESOLINE) 50 MG tablet Take 1.5 tablets (75 mg total) by mouth 3 (three) times daily. 135 tablet 6  . ibuprofen (ADVIL) 600 MG tablet TAKE 1 TABLET TWICE DAILY AS NEEDED 180 tablet 1  . nystatin (MYCOSTATIN/NYSTOP) powder APPLY  TOPICALLY TWICE DAILY AS NEEDED (Patient taking differently: Apply 1 Bottle topically 2 (two) times daily as needed (irritation). APPLY  TOPICALLY TWICE DAILY AS NEEDED) 60 g 2  . traZODone (DESYREL) 150 MG tablet TAKE 1 TABLET AT BEDTIME 90 tablet 3   No current facility-administered medications for this visit.     Past Surgical History:  Procedure Laterality Date  . ABDOMINAL HYSTERECTOMY    . BACK SURGERY  12-07-11   2'10 L5 x2  . COLONOSCOPY WITH PROPOFOL N/A 06/03/2014   Procedure: COLONOSCOPY WITH PROPOFOL; IN CECUM AT 0750; TOTAL WITHDRAWAL TIME 16 MINUTES;  Surgeon: Rogene Houston, MD;  Location: [facility];  Service: Endoscopy;  Laterality: N/A;  . left ankle surgery     tendonitis  . LUMBAR LAMINECTOMY/DECOMPRESSION MICRODISCECTOMY  12/11/2011   Procedure: LUMBAR LAMINECTOMY/DECOMPRESSION MICRODISCECTOMY;  Surgeon: Tobi Bastos, MD;  Location: [facility];  Service: Orthopedics;  Laterality: Left;  Decompressive Lumbar Laminectomy L5-S1 on Left  . POLYPECTOMY N/A 06/03/2014   Procedure: POLYPECTOMY;  Surgeon: Rogene Houston, MD;  Location: [facility];  Service: Endoscopy;  Laterality: N/A;  . right wrist surgery for pinched nerve    . trigger finger surgery  12-07-11   rt. middle trigger finger release     Allergies  Allergen Reactions  . Sulfa Antibiotics   . Nystatin Rash    Oral rash  . Percocet [Oxycodone-Acetaminophen] Rash    Oral rash  . Vesicare [Solifenacin] Rash  . Vicodin [Hydrocodone-Acetaminophen] Rash    Oral rash      Family History  Problem Relation  Age of Onset  . Cancer Mother        uterine  . Cancer Father        lung     Social History Paula Long reports that she has been smoking cigarettes. She started smoking about 49 years ago. She has a 96.00 pack-year smoking history. She has never used smokeless tobacco. Ms. Tait reports no history of alcohol use.   Review of Systems CONSTITUTIONAL: No weight loss, fever, chills, weakness or fatigue.  HEENT: Eyes: No visual loss, blurred vision, double vision or yellow sclerae.No hearing loss, sneezing, congestion, runny nose or sore throat.  SKIN: No rash or  itching.  CARDIOVASCULAR: per hpi RESPIRATORY: No shortness of breath, cough or sputum.  GASTROINTESTINAL: No anorexia, nausea, vomiting or diarrhea. No abdominal pain or blood.  GENITOURINARY: No burning on urination, no polyuria NEUROLOGICAL: No headache, dizziness, syncope, paralysis, ataxia, numbness or tingling in the extremities. No change in bowel or bladder control.  MUSCULOSKELETAL: No muscle, back pain, joint pain or stiffness.  LYMPHATICS: No enlarged nodes. No history of splenectomy.  PSYCHIATRIC: No history of depression or anxiety.  ENDOCRINOLOGIC: No reports of sweating, cold or heat intolerance. No polyuria or polydipsia.  Marland Kitchen   Physical Examination Today's Vitals   04/23/19 1023  BP: (!) 155/66  Pulse: 70  Temp: 98.6 F (37 C)  SpO2: 96%  Weight: 223 lb (101.2 kg)  Height: 5\' 5"  (1.651 m)   Body mass index is 37.11 kg/m.  Gen: resting comfortably, no acute distress HEENT: no scleral icterus, pupils equal round and reactive, no palptable cervical adenopathy,  CV: RRR, no m/r/g, no jvd Resp: Clear to auscultation bilaterally GI: abdomen is soft, non-tender, non-distended, normal bowel sounds, no hepatosplenomegaly MSK: extremities are warm, no edema.  Skin: warm, no rash Neuro:  no focal deficits Psych: appropriate affect   Diagnostic Studies  08/2017 echo Study Conclusions  - Left  ventricle: The cavity size was normal. Wall thickness was normal. Systolic function was normal. The estimated ejection fraction was in the range of 55% to 60%. Wall motion was normal; there were no regional wall motion abnormalities. Left ventricular diastolic function parameters were normal. - Aortic valve: Mildly calcified annulus. Mildly thickened leaflets. Valve area (VTI): 2.58 cm^2. Valve area (Vmax): 2.54 cm^2. - Mitral valve: Mildly calcified annulus. Normal thickness leaflets . - Left atrium: The atrium was mildly dilated. - Technically adequate study.  Jan 2020 event monitor  14 day event monitor  Min HR 46, Max HR 123, Avg HR 73. Min HR occurred in very early AM hours presumably while sleeping  No symptoms reported  Telemetry tracings show sinus rhythm  No significant arrhythmias   Assessment and Plan   1. Syncope - uncleaer etiology, orthostatics negative today - history concerning for possible cardiogenic syncope. No significant prodrome, landed on her face with severe bruising, chest pain prior - obtain 30 day event monitor   2. HTN - Cr elevation on ARB, was stopped. Had similar pattern when on ACEI in the past. Agree with pcp ordering renal artery [place] - follow bp' with increased hydral dosing she started just yesterday      Paula Long, M.D

## 2019-04-23 NOTE — Patient Instructions (Signed)
Medication Instructions:  Your physician recommends that you continue on your current medications as directed. Please refer to the Current Medication list given to you today.   Labwork: none  Testing/Procedures: Your physician has recommended that you wear an event monitor. Event monitors are medical devices that record the heart's electrical activity. Doctors most often [place] these monitors to diagnose arrhythmias. Arrhythmias are problems with the speed or rhythm of the heartbeat. The monitor is a small, portable device. You can wear one while you do your normal daily activities. This is usually used to diagnose what is causing palpitations/syncope (passing out).    Follow-Up: Your physician recommends that you schedule a follow-up appointment in: 6 weeks    Any Other Special Instructions Will Be Listed Below (If Applicable).     If you need a refill on your cardiac medications before your next appointment, please call your pharmacy.

## 2019-04-24 ENCOUNTER — Other Ambulatory Visit: Payer: Self-pay

## 2019-04-25 ENCOUNTER — Ambulatory Visit (HOSPITAL_COMMUNITY): Payer: Medicare HMO

## 2019-04-29 ENCOUNTER — Ambulatory Visit (HOSPITAL_COMMUNITY)
Admission: RE | Admit: 2019-04-29 | Discharge: 2019-04-29 | Disposition: A | Discharge status: Home / Self Care | Payer: Medicare HMO | Source: Ambulatory Visit | Attending: Family Medicine | Admitting: Family Medicine

## 2019-04-29 ENCOUNTER — Other Ambulatory Visit: Payer: Self-pay

## 2019-04-29 DIAGNOSIS — I10 Essential (primary) hypertension: Secondary | ICD-10-CM

## 2019-04-29 NOTE — Progress Notes (Signed)
Renal artery duplex has been completed.   Preliminary results in CV Proc.   Abram Sander 04/29/2019 10:01 AM

## 2019-04-30 ENCOUNTER — Other Ambulatory Visit: Payer: Self-pay | Admitting: Cardiology

## 2019-04-30 ENCOUNTER — Other Ambulatory Visit: Payer: Self-pay | Admitting: *Deleted

## 2019-04-30 DIAGNOSIS — K551 Chronic vascular disorders of intestine: Secondary | ICD-10-CM

## 2019-04-30 DIAGNOSIS — I701 Atherosclerosis of renal artery: Secondary | ICD-10-CM

## 2019-04-30 DIAGNOSIS — I774 Celiac artery compression syndrome: Secondary | ICD-10-CM

## 2019-04-30 DIAGNOSIS — I771 Stricture of artery: Secondary | ICD-10-CM

## 2019-05-01 ENCOUNTER — Other Ambulatory Visit: Payer: Self-pay | Admitting: *Deleted

## 2019-05-01 ENCOUNTER — Ambulatory Visit (HOSPITAL_COMMUNITY): Admission: RE | Admit: 2019-05-01 | Payer: Medicare HMO | Source: Ambulatory Visit

## 2019-05-01 MED ORDER — FUROSEMIDE 40 MG PO TABS: 40.0000 mg | ORAL_TABLET | Freq: Two times a day (BID) | ORAL | 3 refills | Status: DC

## 2019-05-01 MED ORDER — ATORVASTATIN CALCIUM 10 MG PO TABS: ORAL_TABLET | ORAL | 3 refills | Status: DC

## 2019-05-01 MED ORDER — DULOXETINE HCL 30 MG PO CPEP: 30.0000 mg | ORAL_CAPSULE | Freq: Every day | ORAL | 3 refills | Status: DC

## 2019-05-01 MED ORDER — DEXILANT 60 MG PO CPDR: 1.0000 | DELAYED_RELEASE_CAPSULE | Freq: Every day | ORAL | 3 refills | Status: DC

## 2019-05-02 ENCOUNTER — Ambulatory Visit (HOSPITAL_COMMUNITY)
Admission: RE | Admit: 2019-05-02 | Discharge: 2019-05-02 | Disposition: A | Discharge status: Home / Self Care | Payer: Medicare HMO | Source: Ambulatory Visit | Attending: Neurology | Admitting: Neurology

## 2019-05-02 ENCOUNTER — Other Ambulatory Visit: Payer: Self-pay

## 2019-05-02 DIAGNOSIS — I6523 Occlusion and stenosis of bilateral carotid arteries: Secondary | ICD-10-CM | POA: Diagnosis not present

## 2019-05-09 DIAGNOSIS — M461 Sacroiliitis, not elsewhere classified: Secondary | ICD-10-CM | POA: Insufficient documentation

## 2019-05-15 ENCOUNTER — Encounter (INDEPENDENT_AMBULATORY_CARE_PROVIDER_SITE_OTHER): Payer: Self-pay | Admitting: *Deleted

## 2019-05-19 ENCOUNTER — Other Ambulatory Visit (HOSPITAL_COMMUNITY): Payer: Self-pay | Admitting: Neurology

## 2019-05-19 DIAGNOSIS — R55 Syncope and collapse: Secondary | ICD-10-CM | POA: Diagnosis not present

## 2019-05-19 DIAGNOSIS — M5416 Radiculopathy, lumbar region: Secondary | ICD-10-CM

## 2019-05-19 DIAGNOSIS — Z79899 Other long term (current) drug therapy: Secondary | ICD-10-CM | POA: Diagnosis not present

## 2019-05-19 DIAGNOSIS — G4710 Hypersomnia, unspecified: Secondary | ICD-10-CM | POA: Diagnosis not present

## 2019-05-19 DIAGNOSIS — M545 Low back pain: Secondary | ICD-10-CM | POA: Diagnosis not present

## 2019-05-19 DIAGNOSIS — M533 Sacrococcygeal disorders, not elsewhere classified: Secondary | ICD-10-CM | POA: Diagnosis not present

## 2019-05-20 ENCOUNTER — Ambulatory Visit (INDEPENDENT_AMBULATORY_CARE_PROVIDER_SITE_OTHER): Payer: Medicare HMO | Admitting: Family Medicine

## 2019-05-20 ENCOUNTER — Other Ambulatory Visit: Payer: Self-pay

## 2019-05-20 ENCOUNTER — Encounter: Payer: Self-pay | Admitting: Family Medicine

## 2019-05-20 ENCOUNTER — Other Ambulatory Visit: Payer: Self-pay | Admitting: Family Medicine

## 2019-05-20 VITALS — BP 168/78 | HR 92 | Temp 98.1°F | Resp 16 | Ht 65.0 in | Wt 222.0 lb

## 2019-05-20 DIAGNOSIS — R21 Rash and other nonspecific skin eruption: Secondary | ICD-10-CM | POA: Diagnosis not present

## 2019-05-20 MED ORDER — DOXYCYCLINE HYCLATE 100 MG PO TABS: 100.0000 mg | ORAL_TABLET | Freq: Two times a day (BID) | ORAL | 0 refills | Status: DC

## 2019-05-20 MED ORDER — TRIAMCINOLONE ACETONIDE 0.1 % EX CREA: 1.0000 "application " | TOPICAL_CREAM | Freq: Two times a day (BID) | CUTANEOUS | 0 refills | Status: DC

## 2019-05-20 NOTE — Patient Instructions (Signed)
Take antibiotics as prescribed Use cream twice a day  F/U as previous

## 2019-05-20 NOTE — Progress Notes (Signed)
   Subjective:    Patient ID: Paula Long, female    DOB: 01-May-1950, 69 y.o.   MRN: [id number]  Patient presents for Rash (irritation and discoloration to R arm- swelling and redness to back of arm- no new exposures)   Pt here with  Rash to right upper posterior arm the past couple days.  She initially had a similar lesion on her right forearm but now that is just a scab with some mild bruising.  She has significant itching.  She has a spot that is hard at the center and redness surrounding.  No known insect bites no change in medication recently no change in detergent or soap.  She did try an antiitch cream which helped minimally.  She has not had any fever no new joint pain     Review Of Systems:  GEN- denies fatigue, fever, weight loss,weakness, recent illness HEENT- denies eye drainage, change in vision, nasal discharge, CVS- denies chest pain, palpitations RESP- denies SOB, cough, wheeze ABD- denies N/V, change in stools, abd pain Neuro- denies headache, dizziness, syncope, seizure activity       Objective:    BP (!) 168/78 (BP Location: Right Arm, Patient Position: Sitting, Cuff Size: Normal)   Pulse 92   Temp 98.1 F (36.7 C) (Temporal)   Resp 16   Ht 5\' 5"  (1.651 m)   Wt 222 lb (100.7 kg)   SpO2 94%   BMI 36.94 kg/m  GEN- NAD, alert and oriented x3 CVS- RRR, no murmur RESP-CTAB Skin right posterior upper arm small vesicle at the center of large area of erythema with mild warmth Right forearm superficial bruises noted with scab 2 to 3 cm above junction of elbow no induration no drainage       Assessment & Plan:      Problem List Items Addressed This Visit    None    Visit Diagnoses    Rash possible cellulitis     -  Primary    Apperance of a insect bite but with surrounding erythema concern for cellulitis. She also picked at lesion on forearm which could be site for bacteria. Treat with doxycycline 100mg  BID, topical steroid for itch She will call if  new lesions come up , even with antibiotic treatment       Note: This dictation was prepared with Dragon dictation along with smaller phrase technology. Any transcriptional errors that result from this process are unintentional.

## 2019-05-21 DIAGNOSIS — Z961 Presence of intraocular lens: Secondary | ICD-10-CM | POA: Diagnosis not present

## 2019-05-21 DIAGNOSIS — Z0100 Encounter for examination of eyes and vision without abnormal findings: Secondary | ICD-10-CM | POA: Diagnosis not present

## 2019-05-21 DIAGNOSIS — H26493 Other secondary cataract, bilateral: Secondary | ICD-10-CM | POA: Diagnosis not present

## 2019-05-21 DIAGNOSIS — S0990XA Unspecified injury of head, initial encounter: Secondary | ICD-10-CM | POA: Diagnosis not present

## 2019-05-21 DIAGNOSIS — H524 Presbyopia: Secondary | ICD-10-CM | POA: Diagnosis not present

## 2019-05-22 ENCOUNTER — Ambulatory Visit: Payer: Medicare HMO | Attending: Internal Medicine

## 2019-05-26 ENCOUNTER — Other Ambulatory Visit: Payer: Self-pay

## 2019-05-26 ENCOUNTER — Ambulatory Visit (INDEPENDENT_AMBULATORY_CARE_PROVIDER_SITE_OTHER): Payer: Medicare HMO

## 2019-05-26 ENCOUNTER — Ambulatory Visit: Payer: Medicare HMO | Admitting: Podiatry

## 2019-05-26 ENCOUNTER — Encounter: Payer: Self-pay | Admitting: Podiatry

## 2019-05-26 VITALS — BP 175/72 | HR 76 | Temp 98.5°F | Resp 16

## 2019-05-26 DIAGNOSIS — L84 Corns and callosities: Secondary | ICD-10-CM

## 2019-05-26 DIAGNOSIS — M2012 Hallux valgus (acquired), left foot: Secondary | ICD-10-CM

## 2019-05-26 DIAGNOSIS — B351 Tinea unguium: Secondary | ICD-10-CM | POA: Diagnosis not present

## 2019-05-26 DIAGNOSIS — M79675 Pain in left toe(s): Secondary | ICD-10-CM | POA: Diagnosis not present

## 2019-05-26 DIAGNOSIS — M79674 Pain in right toe(s): Secondary | ICD-10-CM | POA: Diagnosis not present

## 2019-05-26 DIAGNOSIS — M2011 Hallux valgus (acquired), right foot: Secondary | ICD-10-CM

## 2019-05-26 NOTE — Patient Instructions (Signed)
Bunion  A bunion is a bump on the base of the big toe that forms when the bones of the big toe joint move out of position. Bunions may be small at first, but they often get larger over time. They can make walking painful. What are the causes? A bunion may be caused by:  Wearing narrow or pointed shoes that force the big toe to press against the other toes.  Abnormal foot development that causes the foot to roll inward (pronate).  Changes in the foot that are caused by certain diseases, such as rheumatoid arthritis or polio.  A foot injury. What increases the risk? The following factors may make you more likely to develop this condition:  Wearing shoes that squeeze the toes together.  Having certain diseases, such as: ? Rheumatoid arthritis. ? Polio. ? Cerebral palsy.  Having family members who have bunions.  Being born with a foot deformity, such as flat feet or low arches.  Doing activities that put a lot of pressure on the feet, such as ballet dancing. What are the signs or symptoms? The main symptom of a bunion is a noticeable bump on the big toe. Other symptoms may include:  Pain.  Swelling around the big toe.  Redness and inflammation.  Thick or hardened skin on the big toe or between the toes.  Stiffness or loss of motion in the big toe.  Trouble with walking. How is this diagnosed? A bunion may be diagnosed based on your symptoms, medical history, and activities. You may have tests, such as:  X-rays. These allow your health care provider to check the position of the bones in your foot and look for damage to your joint. They also help your health care provider determine the severity of your bunion and the best way to treat it.  Joint aspiration. In this test, a sample of fluid is removed from the toe joint. This test may be done if you are in a lot of pain. It helps rule out diseases that cause painful swelling of the joints, such as arthritis. How is this  treated? Treatment depends on the severity of your symptoms. The goal of treatment is to relieve symptoms and prevent the bunion from getting worse. Your health care provider may recommend:  Wearing shoes that have a wide toe box.  Using bunion pads to cushion the affected area.  Taping your toes together to keep them in a normal position.  Placing a device inside your shoe (orthotics) to help reduce pressure on your toe joint.  Taking medicine to ease pain, inflammation, and swelling.  Applying heat or ice to the affected area.  Doing stretching exercises.  Surgery to remove scar tissue and move the toes back into their normal position. This treatment is rare. Follow these instructions at home: Managing pain, stiffness, and swelling   If directed, put ice on the painful area: ? Put ice in a plastic bag. ? Place a towel between your skin and the bag. ? Leave the ice on for 20 minutes, 2-3 times a day. Activity   If directed, apply heat to the affected area before you exercise. Use the heat source that your health care provider recommends, such as a moist heat pack or a heating pad. ? Place a towel between your skin and the heat source. ? Leave the heat on for 20-30 minutes. ? Remove the heat if your skin turns bright red. This is especially important if you are unable to feel pain,   heat, or cold. You may have a greater risk of getting burned.  Do exercises as told by your health care provider. General instructions  Support your toe joint with proper footwear, shoe padding, or taping as told by your health care provider.  Take over-the-counter and prescription medicines only as told by your health care provider.  Keep all follow-up visits as told by your health care provider. This is important. Contact a health care provider if your symptoms:  Get worse.  Do not improve in 2 weeks. Get help right away if you have:  Severe pain and trouble with walking. Summary  A  bunion is a bump on the base of the big toe that forms when the bones of the big toe joint move out of position.  Bunions can make walking painful.  Treatment depends on the severity of your symptoms.  Support your toe joint with proper footwear, shoe padding, or taping as told by your health care provider. This information is not intended to replace advice given to you by your health care provider. Make sure you discuss any questions you have with your health care provider. Document Revised: 09/10/2017 Document Reviewed: 07/17/2017 Elsevier Patient Education  2020 [employer]  

## 2019-05-26 NOTE — Progress Notes (Signed)
   Subjective:    Patient ID: Paula Long, female    DOB: [birth year], 69 y.o.   MRN: [id number]  HPI    Review of Systems  All other systems reviewed and are negative.      Objective:   Physical Exam        Assessment & Plan:

## 2019-05-29 NOTE — Progress Notes (Signed)
Subjective:   Patient ID: Paula Long, female   DOB: 69 y.o.   MRN: [id number]   HPI Patient presents stating having nail disease both feet with thick yellow brittle nailbeds and lesion on the left foot that is painful and difficult.  States this is been ongoing and making increased problems for her to walk.  Also complains of digital deformities bilateral   ROS      Objective:  Physical Exam  Neurovascular status intact with thick yellow brittle nailbeds 1-5 both feet that she cannot cut and painful lesion plantar left fifth metatarsal that is thick keratotic and pain.  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

## 2019-05-30 ENCOUNTER — Ambulatory Visit (HOSPITAL_COMMUNITY)
Admission: RE | Admit: 2019-05-30 | Discharge: 2019-05-30 | Disposition: A | Discharge status: Home / Self Care | Payer: Medicare HMO | Source: Ambulatory Visit | Attending: Neurology | Admitting: Neurology

## 2019-05-30 ENCOUNTER — Ambulatory Visit (HOSPITAL_COMMUNITY): Admission: RE | Admit: 2019-05-30 | Payer: Medicare HMO | Source: Ambulatory Visit

## 2019-05-30 ENCOUNTER — Other Ambulatory Visit: Payer: Self-pay

## 2019-05-30 DIAGNOSIS — M5416 Radiculopathy, lumbar region: Secondary | ICD-10-CM | POA: Insufficient documentation

## 2019-05-30 DIAGNOSIS — M47816 Spondylosis without myelopathy or radiculopathy, lumbar region: Secondary | ICD-10-CM | POA: Diagnosis not present

## 2019-05-30 DIAGNOSIS — M4726 Other spondylosis with radiculopathy, lumbar region: Secondary | ICD-10-CM | POA: Diagnosis not present

## 2019-06-02 ENCOUNTER — Ambulatory Visit (HOSPITAL_COMMUNITY): Payer: Medicare HMO

## 2019-06-03 DIAGNOSIS — Z85828 Personal history of other malignant neoplasm of skin: Secondary | ICD-10-CM | POA: Diagnosis not present

## 2019-06-03 DIAGNOSIS — L821 Other seborrheic keratosis: Secondary | ICD-10-CM | POA: Diagnosis not present

## 2019-06-03 DIAGNOSIS — L570 Actinic keratosis: Secondary | ICD-10-CM | POA: Diagnosis not present

## 2019-06-05 ENCOUNTER — Other Ambulatory Visit: Payer: Self-pay

## 2019-06-05 ENCOUNTER — Ambulatory Visit (HOSPITAL_COMMUNITY)
Admission: RE | Admit: 2019-06-05 | Discharge: 2019-06-05 | Disposition: A | Discharge status: Home / Self Care | Payer: Medicare HMO | Source: Ambulatory Visit | Attending: Neurology | Admitting: Neurology

## 2019-06-05 DIAGNOSIS — R569 Unspecified convulsions: Secondary | ICD-10-CM | POA: Diagnosis not present

## 2019-06-05 DIAGNOSIS — R400 Somnolence: Secondary | ICD-10-CM | POA: Diagnosis not present

## 2019-06-05 DIAGNOSIS — R55 Syncope and collapse: Secondary | ICD-10-CM | POA: Diagnosis not present

## 2019-06-05 DIAGNOSIS — Z79899 Other long term (current) drug therapy: Secondary | ICD-10-CM | POA: Insufficient documentation

## 2019-06-05 DIAGNOSIS — Z7982 Long term (current) use of aspirin: Secondary | ICD-10-CM | POA: Insufficient documentation

## 2019-06-05 NOTE — Progress Notes (Signed)
EEG complete - results pending 

## 2019-06-06 ENCOUNTER — Ambulatory Visit: Payer: Medicare HMO | Admitting: Cardiology

## 2019-06-06 ENCOUNTER — Encounter: Payer: Self-pay | Admitting: Cardiology

## 2019-06-06 VITALS — BP 158/78 | HR 85 | Temp 97.8°F | Ht 65.0 in | Wt 223.0 lb

## 2019-06-06 DIAGNOSIS — I10 Essential (primary) hypertension: Secondary | ICD-10-CM | POA: Diagnosis not present

## 2019-06-06 MED ORDER — HYDRALAZINE HCL 50 MG PO TABS: 75.0000 mg | ORAL_TABLET | Freq: Three times a day (TID) | ORAL | 3 refills | Status: DC

## 2019-06-06 NOTE — Patient Instructions (Signed)
Medication Instructions:  INCREASE HYDRALAZINE TO 75 MG (1.5 TABLETS) , THREE TIMES DAILY   Labwork: NONE  Testing/Procedures: NONE  Follow-Up: Your physician wants you to follow-up in: 6  MONTHS. You will receive a reminder letter in the mail two months in advance. If you don't receive a letter, please call our office to schedule the follow-up appointment.   Any Other Special Instructions Will Be Listed Below (If Applicable).     If you need a refill on your cardiac medications before your next appointment, please call your pharmacy.

## 2019-06-06 NOTE — Progress Notes (Signed)
Clinical Summary Paula Long is a 69 y.o.female seen today for follow up of the following medical problems.Focused visit on recent issues with HTN     1. HTN - high bp's recently up to 200s/100s. First thing in AM 193/93 before meds. Later 225/105, then 186/90 and 168/70. HRs 70-80s - seen in ER 08/07/18 with elevated bp's. - recently restarted on norvasc by another provider   - chlrothalidone and lisinopril stopped due to AKI. Cr from 0.97-->1.3-->1.56 on 6/17. K was up to 5.6.  - after stoppring Cr trended down to 1.38, K 4.6    - started on losartan 25mg  during visit with PA Jan 5,2021 for high bp's - uptrend in Cr similar to what she had on lisinopril in the past, we stopped losartan and increased hydralazine -she  just increased hydral to 75mg  tid yesterday  - home bp's 180/90s.  04/2019 renal artery [place]  right 1-59%, normal leff    Past Medical History:  Diagnosis Date  . Anxiety   . Arthritis 12-07-11   arthritis,DDD,spinal stenosis  . Chronic back pain   . COPD (chronic obstructive pulmonary disease) ([place]) 12-07-11   pt. uses nebulizer as needed and inhaler daily  . Fibromyalgia    skin cancer  . GERD (gastroesophageal reflux disease)   . Hemorrhoids   . Hypertension 12-07-11   presently no meds  . Normal cardiac stress test 06/2009  . Shortness of breath 12-07-11   less now, occ wheezes  . Skin cancer    melanoma.  face 2014     Allergies  Allergen Reactions  . Sulfa Antibiotics   . Nystatin Rash    Oral rash  . Percocet [Oxycodone-Acetaminophen] Rash    Oral rash  . Vesicare [Solifenacin] Rash  . Vicodin [Hydrocodone-Acetaminophen] Rash    Oral rash     Current Outpatient Medications  Medication Sig Dispense Refill  . albuterol (PROVENTIL) (2.5 MG/3ML) 0.083% nebulizer solution INHALE THE CONTENTS OF 1 VIAL VIA NEBULIZER EVERY 4 HOURS AS NEEDED FOR WHEEZING OR SHORTNESS OF BREATH (Patient taking differently: Take 2.5 mg by nebulization  every 4 (four) hours as needed for wheezing or shortness of breath. ) 90 mL 1  . albuterol (VENTOLIN HFA) 108 (90 Base) MCG/ACT inhaler INHALE 2 PUFFS EVERY 4 HOURS AS NEEDED FOR WHEEZING OR SHORTNESS OF BREATH 18 g 3  . aspirin 325 MG tablet Take 1 tablet (325 mg total) by mouth daily. 30 tablet 3  . atorvastatin (LIPITOR) 10 MG tablet TAKE 1 TABLET BY MOUTH ONCE DAILY AT 6:00 PM. 90 tablet 3  . Clobetasol Prop Emollient Base 0.05 % emollient cream Apply to affected areas BID 30 g 0  . desmopressin (DDAVP) 0.1 MG tablet TAKE 1 TABLET AT BEDTIME FOR BLADDER (Patient taking differently: Take 0.1 mg by mouth at bedtime. For bladder) 90 tablet 3  . dexlansoprazole (DEXILANT) 60 MG capsule Take 1 capsule (60 mg total) by mouth daily. 90 capsule 3  . diltiazem (CARDIZEM) 120 MG tablet TAKE 1 TABLET (120 MG TOTAL) BY MOUTH 2 (TWO) TIMES DAILY. INCREASED DOSE 09/27/2018 180 tablet 3  . doxycycline (VIBRA-TABS) 100 MG tablet Take 1 tablet (100 mg total) by mouth 2 (two) times daily. 14 tablet 0  . DULoxetine (CYMBALTA) 30 MG capsule Take 1 capsule (30 mg total) by mouth daily. 90 capsule 3  . EASYMAX TEST test strip USE TO TEST ONCE DAILY. (Patient taking differently: See admin instructions. USE TO TEST ONCE DAILY) 100 each  3  . furosemide (LASIX) 40 MG tablet Take 1 tablet (40 mg total) by mouth 2 (two) times daily. 180 tablet 3  . gabapentin (NEURONTIN) 400 MG capsule Take 1 capsule (400 mg total) by mouth at bedtime. 90 capsule 3  . hydrALAZINE (APRESOLINE) 50 MG tablet TAKE 1 TABLET (50 MG TOTAL) BY MOUTH 3 (THREE) TIMES DAILY. INCREASED DOSE 09/27/2018 270 tablet 3  . ibuprofen (ADVIL) 600 MG tablet TAKE 1 TABLET TWICE DAILY AS NEEDED 180 tablet 1  . nystatin (MYCOSTATIN/NYSTOP) powder APPLY  TOPICALLY TWICE DAILY AS NEEDED (Patient taking differently: Apply 1 Bottle topically 2 (two) times daily as needed (irritation). APPLY  TOPICALLY TWICE DAILY AS NEEDED) 60 g 2  . traZODone (DESYREL) 150 MG tablet  TAKE 1 TABLET AT BEDTIME 90 tablet 3  . triamcinolone cream (KENALOG) 0.1 % Apply 1 application topically 2 (two) times daily. 30 g 0   No current facility-administered medications for this visit.     Past Surgical History:  Procedure Laterality Date  . ABDOMINAL HYSTERECTOMY    . BACK SURGERY  12-07-11   2'10 L5 x2  . COLONOSCOPY WITH PROPOFOL N/A 06/03/2014   Procedure: COLONOSCOPY WITH PROPOFOL; IN CECUM AT 0750; TOTAL WITHDRAWAL TIME 16 MINUTES;  Surgeon: Rogene Houston, MD;  Location: [facility];  Service: Endoscopy;  Laterality: N/A;  . left ankle surgery     tendonitis  . LUMBAR LAMINECTOMY/DECOMPRESSION MICRODISCECTOMY  12/11/2011   Procedure: LUMBAR LAMINECTOMY/DECOMPRESSION MICRODISCECTOMY;  Surgeon: Tobi Bastos, MD;  Location: [facility];  Service: Orthopedics;  Laterality: Left;  Decompressive Lumbar Laminectomy L5-S1 on Left  . POLYPECTOMY N/A 06/03/2014   Procedure: POLYPECTOMY;  Surgeon: Rogene Houston, MD;  Location: [facility];  Service: Endoscopy;  Laterality: N/A;  . right wrist surgery for pinched nerve    . trigger finger surgery  12-07-11   rt. middle trigger finger release     Allergies  Allergen Reactions  . Sulfa Antibiotics   . Nystatin Rash    Oral rash  . Percocet [Oxycodone-Acetaminophen] Rash    Oral rash  . Vesicare [Solifenacin] Rash  . Vicodin [Hydrocodone-Acetaminophen] Rash    Oral rash      Family History  Problem Relation Age of Onset  . Cancer Mother        uterine  . Cancer Father        lung     Social History Paula Long reports that she has been smoking cigarettes. She started smoking about 49 years ago. She has a 96.00 pack-year smoking history. She has never used smokeless tobacco. Paula Long reports no history of alcohol use.   Review of Systems CONSTITUTIONAL: No weight loss, fever, chills, weakness or fatigue.  HEENT: Eyes: No visual loss, blurred vision, double vision or yellow sclerae.No hearing loss, sneezing, congestion,  runny nose or sore throat.  SKIN: No rash or itching.  CARDIOVASCULAR: per hpi RESPIRATORY: No shortness of breath, cough or sputum.  GASTROINTESTINAL: No anorexia, nausea, vomiting or diarrhea. No abdominal pain or blood.  GENITOURINARY: No burning on urination, no polyuria NEUROLOGICAL: No headache, dizziness, syncope, paralysis, ataxia, numbness or tingling in the extremities. No change in bowel or bladder control.  MUSCULOSKELETAL: No muscle, back pain, joint pain or stiffness.  LYMPHATICS: No enlarged nodes. No history of splenectomy.  PSYCHIATRIC: No history of depression or anxiety.  ENDOCRINOLOGIC: No reports of sweating, cold or heat intolerance. No polyuria or polydipsia.  Marland Kitchen   Physical Examination Today's Vitals   06/06/19  1349  BP: (!) 158/78  Pulse: 85  Temp: 97.8 F (36.6 C)  SpO2: 95%  Weight: 223 lb (101.2 kg)  Height: 5\' 5"  (1.651 m)   Body mass index is 37.11 kg/m.  Gen: resting comfortably, no acute distress HEENT: no scleral icterus, pupils equal round and reactive, no palptable cervical adenopathy,  CV: RRR, no mr/g no jvd Resp: Clear to auscultation bilaterally GI: abdomen is soft, non-tender, non-distended, normal bowel sounds, no hepatosplenomegaly MSK: extremities are warm, no edema.  Skin: warm, no rash Neuro:  no focal deficits Psych: appropriate affect   Diagnostic Studies  08/2017 echo Study Conclusions  - Left ventricle: The cavity size was normal. Wall thickness was normal. Systolic function was normal. The estimated ejection fraction was in the range of 55% to 60%. Wall motion was normal; there were no regional wall motion abnormalities. Left ventricular diastolic function parameters were normal. - Aortic valve: Mildly calcified annulus. Mildly thickened leaflets. Valve area (VTI): 2.58 cm^2. Valve area (Vmax): 2.54 cm^2. - Mitral valve: Mildly calcified annulus. Normal thickness leaflets . - Left atrium: The atrium  was mildly dilated. - Technically adequate study.  Jan 2020 event monitor  14 day event monitor  Min HR 46, Max HR 123, Avg HR 73. Min HR occurred in very early AM hours presumably while sleeping  No symptoms reported  Telemetry tracings show sinus rhythm  No significant arrhythmias   Assessment and Plan   1. HTN - Cr elevation on ARB, was stopped. Had similar pattern when on ACEI in the past. Agree with pcp ordering renal artery [place] - increase hydralazine to 75mg  tid for ongoing HTN      Arnoldo Lenis, M.D.

## 2019-06-09 NOTE — Procedures (Signed)
Paula A. [name], MD     [email or web address]           HISTORY: This is a 69 year old who presents with recurrent episodes of syncope worrisome for seizures.  MEDICATIONS:  Current Outpatient Medications:  .  albuterol (PROVENTIL) (2.5 MG/3ML) 0.083% nebulizer solution, INHALE THE CONTENTS OF 1 VIAL VIA NEBULIZER EVERY 4 HOURS AS NEEDED FOR WHEEZING OR SHORTNESS OF BREATH (Patient taking differently: Take 2.5 mg by nebulization every 4 (four) hours as needed for wheezing or shortness of breath. ), Disp: 90 mL, Rfl: 1 .  albuterol (VENTOLIN HFA) 108 (90 Base) MCG/ACT inhaler, INHALE 2 PUFFS EVERY 4 HOURS AS NEEDED FOR WHEEZING OR SHORTNESS OF BREATH, Disp: 18 g, Rfl: 3 .  aspirin 325 MG tablet, Take 1 tablet (325 mg total) by mouth daily., Disp: 30 tablet, Rfl: 3 .  atorvastatin (LIPITOR) 10 MG tablet, TAKE 1 TABLET BY MOUTH ONCE DAILY AT 6:00 PM., Disp: 90 tablet, Rfl: 3 .  Clobetasol Prop Emollient Base 0.05 % emollient cream, Apply to affected areas BID, Disp: 30 g, Rfl: 0 .  desmopressin (DDAVP) 0.1 MG tablet, TAKE 1 TABLET AT BEDTIME FOR BLADDER (Patient taking differently: Take 0.1 mg by mouth at bedtime. For bladder), Disp: 90 tablet, Rfl: 3 .  dexlansoprazole (DEXILANT) 60 MG capsule, Take 1 capsule (60 mg total) by mouth daily., Disp: 90 capsule, Rfl: 3 .  diltiazem (CARDIZEM) 120 MG tablet, TAKE 1 TABLET (120 MG TOTAL) BY MOUTH 2 (TWO) TIMES DAILY. INCREASED DOSE 09/27/2018, Disp: 180 tablet, Rfl: 3 .  doxycycline (VIBRA-TABS) 100 MG tablet, Take 1 tablet (100 mg total) by mouth 2 (two) times daily., Disp: 14 tablet, Rfl: 0 .  DULoxetine (CYMBALTA) 30 MG capsule, Take 1 capsule (30 mg total) by mouth daily., Disp: 90 capsule, Rfl: 3 .  EASYMAX TEST test strip, USE TO TEST ONCE DAILY. (Patient taking differently: See admin instructions. USE TO TEST ONCE DAILY), Disp: 100 each, Rfl: 3 .  furosemide (LASIX) 40 MG tablet, Take 40 mg by mouth daily., Disp: , Rfl:   .  gabapentin (NEURONTIN) 400 MG capsule, Take 1 capsule (400 mg total) by mouth at bedtime., Disp: 90 capsule, Rfl: 3 .  hydrALAZINE (APRESOLINE) 50 MG tablet, Take 1.5 tablets (75 mg total) by mouth 3 (three) times daily., Disp: 405 tablet, Rfl: 3 .  ibuprofen (ADVIL) 600 MG tablet, TAKE 1 TABLET TWICE DAILY AS NEEDED, Disp: 180 tablet, Rfl: 1 .  nystatin (MYCOSTATIN/NYSTOP) powder, APPLY  TOPICALLY TWICE DAILY AS NEEDED (Patient taking differently: Apply 1 Bottle topically 2 (two) times daily as needed (irritation). APPLY  TOPICALLY TWICE DAILY AS NEEDED), Disp: 60 g, Rfl: 2 .  traZODone (DESYREL) 150 MG tablet, TAKE 1 TABLET AT BEDTIME, Disp: 90 tablet, Rfl: 3 .  triamcinolone cream (KENALOG) 0.1 %, Apply 1 application topically 2 (two) times daily., Disp: 30 g, Rfl: 0     ANALYSIS: A 16 channel recording using standard 10 20 measurements is conducted for 26 minutes.  There is a well-formed posterior dominant rhythm of  9.5 hertz which attenuates with eye opening. There is beta activity observed in the frontal areas. Awake and the drowsy architecture are observed. This is associated with theta slowing. There is bitemporal the they are sharp wave activity seen in transition to drowsiness but nothing clearly epileptiform. Photic stimulation is carried out without abnormal changes in the background activity. There is no focal or lateralized slowing. There is no epileptiform activity noted.  IMPRESSION: 1. This is a normal recording of awake and drowsy states.      Chistina Roston A. Merlene Long, M.D.  Diplomate, Tax adviser of Psychiatry and Neurology ( Neurology).

## 2019-06-20 ENCOUNTER — Other Ambulatory Visit: Payer: Self-pay | Admitting: Family Medicine

## 2019-06-20 DIAGNOSIS — M5416 Radiculopathy, lumbar region: Secondary | ICD-10-CM | POA: Diagnosis not present

## 2019-06-20 DIAGNOSIS — M545 Low back pain: Secondary | ICD-10-CM | POA: Diagnosis not present

## 2019-06-23 ENCOUNTER — Other Ambulatory Visit: Payer: Self-pay

## 2019-06-23 ENCOUNTER — Encounter: Payer: Self-pay | Admitting: Surgery

## 2019-06-23 ENCOUNTER — Ambulatory Visit: Payer: Medicare HMO | Admitting: Surgery

## 2019-06-23 VITALS — BP 213/84 | HR 67 | Temp 98.8°F | Resp 14 | Ht 65.0 in | Wt 220.0 lb

## 2019-06-23 DIAGNOSIS — I6523 Occlusion and stenosis of bilateral carotid arteries: Secondary | ICD-10-CM | POA: Diagnosis not present

## 2019-06-23 DIAGNOSIS — K551 Chronic vascular disorders of intestine: Secondary | ICD-10-CM | POA: Diagnosis not present

## 2019-06-23 NOTE — Progress Notes (Signed)
Dear Dr. Harl Bowie,   Here is note from Vascular, She has some right artery stenosis but not signficant enough to warrant treatment.  He recommended the Hypertensive clinic ? I am not aware of this clinic.     I will follow your recommendation on this.     [employer]

## 2019-06-23 NOTE — Progress Notes (Signed)
[facility]  Patient name: Paula Long MRN: [id number] DOB: June 19, 1950 Sex: female   REQUESTING PROVIDER:    Dr. 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 female, who is referred for evaluation of abnormal ultrasound findings of her carotid, renal and mesenteric vasculature.  She denies any postprandial abdominal pain or unintentional weight loss.  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  She is a current smoker.  The patient has a history of palpitations, for which she sees cardiology.  She had a uneventful cardiac event monitor performed.  She has a history of hypertension.  Her blood pressure is not well controlled.  She has been on an ACE inhibitor but this was discontinued because of renal issues.  She is now on ARB.  She has also had a increase in her creatinine.  PAST MEDICAL HISTORY    Past Medical History:  Diagnosis Date  . Anxiety   . Arthritis 12-07-11   arthritis,DDD,spinal stenosis  . Chronic back pain   . COPD (chronic obstructive pulmonary disease) ([place]) 12-07-11   pt. uses nebulizer as needed and inhaler daily  . Fibromyalgia    skin cancer  . GERD (gastroesophageal reflux disease)   . Hemorrhoids   . Hypertension 12-07-11   presently no meds  . Normal cardiac stress test 06/2009  . Shortness of breath 12-07-11   less now, occ wheezes  . Skin cancer    melanoma.  face 2014     FAMILY HISTORY   Family History  Problem Relation Age of Onset  . Cancer Mother        uterine  . Cancer Father        lung    SOCIAL HISTORY:   Social History   Socioeconomic History  . Marital status: Legally Separated    Spouse name: Not on file  . Number of children: Not on file  . Years of education: Not on file  . Highest education level: Not on file  Occupational History    . Not on file  Tobacco Use  . Smoking status: Current Every Day Smoker    Packs/day: 2.00    Years: 48.00    Pack years: 96.00    Types: Cigarettes    Start date: 01/07/1970  . Smokeless tobacco: Never Used  . Tobacco comment: since age 69. smoking 2-3 packs a day  Substance and Sexual Activity  . Alcohol use: No    Alcohol/week: 0.0 standard drinks  . Drug use: No  . Sexual activity: Yes    Birth control/protection: Surgical  Other Topics Concern  . Not on file  Social History Narrative  . Not on file   Social Determinants of Health   Financial Resource Strain:   . Difficulty of Paying Living Expenses:   Food Insecurity:   . Worried About Charity fundraiser in the Last Year:   . Arboriculturist in the Last Year:   Transportation Needs:   . Film/video editor (Medical):   Marland Kitchen Lack of Transportation (Non-Medical):   Physical Activity:   . Days of Exercise per Week:   . Minutes of Exercise per Session:   Stress:   . Feeling of Stress :   Social Connections:   . Frequency of Communication with Friends and Family:   .  Frequency of Social Gatherings with Friends and Family:   . Attends Religious Services:   . Active Member of Clubs or Organizations:   . Attends Archivist Meetings:   Marland Kitchen Marital Status:   Intimate Partner Violence:   . Fear of Current or Ex-Partner:   . Emotionally Abused:   Marland Kitchen Physically Abused:   . Sexually Abused:     ALLERGIES:    Allergies  Allergen Reactions  . Sulfa Antibiotics   . Nystatin Rash    Oral rash  . Percocet [Oxycodone-Acetaminophen] Rash    Oral rash  . Vesicare [Solifenacin] Rash  . Vicodin [Hydrocodone-Acetaminophen] Rash    Oral rash    CURRENT MEDICATIONS:    Current Outpatient Medications  Medication Sig Dispense Refill  . albuterol (PROVENTIL) (2.5 MG/3ML) 0.083% nebulizer solution INHALE THE CONTENTS OF 1 VIAL VIA NEBULIZER EVERY 4 HOURS AS NEEDED FOR WHEEZING OR SHORTNESS OF BREATH (Patient  taking differently: Take 2.5 mg by nebulization every 4 (four) hours as needed for wheezing or shortness of breath. ) 90 mL 1  . albuterol (VENTOLIN HFA) 108 (90 Base) MCG/ACT inhaler INHALE 2 PUFFS EVERY 4 HOURS AS NEEDED FOR WHEEZING OR SHORTNESS OF BREATH 18 g 3  . aspirin 325 MG tablet Take 1 tablet (325 mg total) by mouth daily. 30 tablet 3  . atorvastatin (LIPITOR) 10 MG tablet TAKE 1 TABLET BY MOUTH ONCE DAILY AT 6:00 PM. 90 tablet 3  . Clobetasol Prop Emollient Base 0.05 % emollient cream Apply to affected areas BID 30 g 0  . desmopressin (DDAVP) 0.1 MG tablet TAKE 1 TABLET AT BEDTIME FOR BLADDER (Patient taking differently: Take 0.1 mg by mouth at bedtime. For bladder) 90 tablet 3  . dexlansoprazole (DEXILANT) 60 MG capsule Take 1 capsule (60 mg total) by mouth daily. 90 capsule 3  . diltiazem (CARDIZEM) 120 MG tablet TAKE 1 TABLET (120 MG TOTAL) BY MOUTH 2 (TWO) TIMES DAILY. INCREASED DOSE 09/27/2018 180 tablet 3  . doxycycline (VIBRA-TABS) 100 MG tablet Take 1 tablet (100 mg total) by mouth 2 (two) times daily. 14 tablet 0  . DULoxetine (CYMBALTA) 30 MG capsule Take 1 capsule (30 mg total) by mouth daily. 90 capsule 3  . EASYMAX TEST test strip USE TO TEST ONCE DAILY. (Patient taking differently: See admin instructions. USE TO TEST ONCE DAILY) 100 each 3  . furosemide (LASIX) 40 MG tablet Take 40 mg by mouth daily.    Marland Kitchen gabapentin (NEURONTIN) 400 MG capsule Take 1 capsule (400 mg total) by mouth at bedtime. 90 capsule 3  . hydrALAZINE (APRESOLINE) 50 MG tablet Take 1.5 tablets (75 mg total) by mouth 3 (three) times daily. 405 tablet 3  . ibuprofen (ADVIL) 600 MG tablet TAKE 1 TABLET TWICE DAILY AS NEEDED 180 tablet 1  . nystatin (MYCOSTATIN/NYSTOP) powder APPLY  TOPICALLY TWICE DAILY AS NEEDED (Patient taking differently: Apply 1 Bottle topically 2 (two) times daily as needed (irritation). APPLY  TOPICALLY TWICE DAILY AS NEEDED) 60 g 2  . traZODone (DESYREL) 150 MG tablet TAKE 1 TABLET AT  BEDTIME 90 tablet 3  . triamcinolone cream (KENALOG) 0.1 % Apply 1 application topically 2 (two) times daily. 30 g 0   No current facility-administered medications for this visit.    REVIEW OF SYSTEMS:   [X]  denotes positive finding, [ ]  denotes negative finding Cardiac  Comments:  Chest pain or chest pressure:    Shortness of breath upon exertion: x   Short of breath when  lying flat: x   Irregular heart rhythm:        Vascular    Pain in calf, thigh, or hip brought on by ambulation: x   Pain in feet at night that wakes you up from your sleep:     Blood clot in your veins:    Leg swelling:  x       Pulmonary    Oxygen at home:    Productive cough:     Wheezing:  x       Neurologic    Sudden weakness in arms or legs:     Sudden numbness in arms or legs:     Sudden onset of difficulty speaking or slurred speech:    Temporary loss of vision in one eye:     Problems with dizziness:         Gastrointestinal    Blood in stool:      Vomited blood:         Genitourinary    Burning when urinating:     Blood in urine:        Psychiatric    Major depression:         Hematologic    Bleeding problems:    Problems with blood clotting too easily:        Skin    Rashes or ulcers:        Constitutional    Fever or chills:     PHYSICAL EXAM:   There were no vitals filed for this visit.  GENERAL: The patient is a well-nourished female, in no acute distress. The vital signs are documented above. CARDIAC: There is a regular rate and rhythm.  VASCULAR: Nonpalpable pedal pulses.  1+ pitting edema PULMONARY: Nonlabored respirations ABDOMEN: Soft and non-tender with normal pitched bowel sounds.  MUSCULOSKELETAL: There are no major deformities or cyanosis. NEUROLOGIC: No focal weakness or paresthesias are detected. SKIN: There are no ulcers or rashes noted. PSYCHIATRIC: The patient has a normal affect.  STUDIES:   I have reviewed the following: Carotid duplex: 1.  Bilateral carotid bifurcation and proximal ICA plaque resulting in 50-69% diameter stenosis. Little change from prior study. 2. Origin stenoses of bilateral external carotid arteries. 3. Antegrade flow in bilateral vertebral arteries.  Renal:    Right: 1-59% stenosis of the right renal artery. Abnormal right     Resistive Index. Normal size right kidney.  Left: No evidence of left renal artery stenosis. Abnormal left     Resisitve Index. Normal size of left kidney.  Mesenteric:  70 to 99% stenosis in the celiac artery and superior mesenteric artery.  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  She is asymptomatic and that she denies postprandial abdominal pain or weight loss.  Therefore no surgical intervention is recommended at this time.  I educated the patient about symptoms to monitor and to contact me should she develop them.  Malignant hypertension: The patient's renal duplex did not show significant renal artery stenosis.  She is currently trying different combinations of medications but still has significantly elevated blood pressure.  Today her systolics are greater than 200.  I have recommended considering hypertension clinic in [place].  Carotid stenosis: The patient is without neurologic symptoms.  She will continue with annual carotid surveillance  Smoking cessation: I discussed with the patient and her daughter via telephone that her smoking is the primary culprit  for all of her underlying atherosclerotic issues.  I told her that she needed to make some serious lifestyle changes.  We talked about smoking cessation, diet and exercise.  Is very forthright with her and that I believe her life expectancy has been significantly cut short because of her smoking and that she needs to stop in order to have a better quality of life.  She is in  agreement and is going to try to begin down this path.  I have her scheduled for follow-up with me in 1 year with repeat carotid mesenteric and renal duplex.   Leia Alf, MD, FACS [facility] [phone] Pager [phone]

## 2019-06-24 ENCOUNTER — Other Ambulatory Visit: Payer: Self-pay | Admitting: *Deleted

## 2019-06-24 DIAGNOSIS — I6523 Occlusion and stenosis of bilateral carotid arteries: Secondary | ICD-10-CM

## 2019-06-24 DIAGNOSIS — K551 Chronic vascular disorders of intestine: Secondary | ICD-10-CM

## 2019-06-30 ENCOUNTER — Telehealth: Payer: Self-pay | Admitting: Cardiology

## 2019-06-30 ENCOUNTER — Ambulatory Visit: Payer: Medicare HMO | Admitting: Cardiology

## 2019-06-30 MED ORDER — HYDRALAZINE HCL 50 MG PO TABS: 100.0000 mg | ORAL_TABLET | Freq: Three times a day (TID) | ORAL | 3 refills | Status: DC

## 2019-06-30 NOTE — Telephone Encounter (Signed)
Reports taking BP one hour after medications this morning and was 216/103 and one hour later was 203/100. Reports BP has been this high all weekend. Reports having a headache for 2 days. Took ibuprofen with relief. Denies chest pain, dizziness, sob, blurred vision, numbness or tingling. Speech is clear. Reports using same cuff and same arm each time. Reports home BP cuff has been checked for accuracy. BP taken while on phone with patient and is 204/86 & HR 80. Medications reviewed. Office visit arranged with Jonni Sanger on 07/01/2019 @8 :00 am. Advised to bring home BP monitor. Advised that message would be sent to her provider. Advised if she develop symptoms dizziness, blurred vision, numbness or tingling, to call 911 or go to the ED. Verbalized understanding.

## 2019-06-30 NOTE — Progress Notes (Addendum)
Cardiology Office Note  Date: 06/30/2019   ID: Paula, Long Mar 12, 1951, MRN [id number]  PCP:  Alycia Rossetti, MD  Cardiologist:  Carlyle Dolly, MD Electrophysiologist:  None   Chief Complaint: Hypertension, palpitations, syncope  History of Present Illness: Paula Long is a 69 y.o. female with a history of hypertension, palpitations, syncope.  Last saw Dr. Harl Bowie 06/06/2019.  Patient had experienced a recent syncopal episode history concerning for possible cardiogenic syncope on 2/29/2021.  No prodromal symptoms.  Patient impacted her face with severe bruising and chest pain  Recent non telemetry monitor showed evidence of atrial flutter. Recent carotid artery duplex demonstrated bilateral ICA stenosis 50-69%.   She was being treated for HTN with ACEI and subsequent ARB. Creatinine was elevated on ACEI and ARB therapy. These medications were subsequently stopped d/t worsening renal function. Renal artery ultrasound  showed 1 to 59% stenosis of right renal artery.  Left showed no evidence of left renal artery stenosis.  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  Patient called on 06/30/2019 stating her BP's were elevated and spoke to Dr Harl Bowie. He increased hydralazine to 100 mg tid.  Her blood pressure today on arrival 182/74.  This is an improvement but she has only taken increased dose of hydralazine for 1 day.  She denies any progressive anginal symptoms but does have dyspnea on exertion.  She has a significantly long history of smoking.  She has had no recurrences of syncopal episodes or near syncope.  She does complain of sweating at night and during the day.  She denies any sensation of palpitations or arrhythmias, CVA or TIA-like symptoms, abdominal pain, diarrhea, postprandial abdominal pain, or weight loss.  Past Medical History:  Diagnosis Date  . Anxiety   . Arthritis 12-07-11   arthritis,DDD,spinal  stenosis  . Chronic back pain   . COPD (chronic obstructive pulmonary disease) ([place]) 12-07-11   pt. uses nebulizer as needed and inhaler daily  . Fibromyalgia    skin cancer  . GERD (gastroesophageal reflux disease)   . Hemorrhoids   . Hypertension 12-07-11   presently no meds  . Normal cardiac stress test 06/2009  . Shortness of breath 12-07-11   less now, occ wheezes  . Skin cancer    melanoma.  face 2014    Past Surgical History:  Procedure Laterality Date  . ABDOMINAL HYSTERECTOMY    . BACK SURGERY  12-07-11   2'10 L5 x2  . COLONOSCOPY WITH PROPOFOL N/A 06/03/2014   Procedure: COLONOSCOPY WITH PROPOFOL; IN CECUM AT 0750; TOTAL WITHDRAWAL TIME 16 MINUTES;  Surgeon: Rogene Houston, MD;  Location: [facility];  Service: Endoscopy;  Laterality: N/A;  . left ankle surgery     tendonitis  . LUMBAR LAMINECTOMY/DECOMPRESSION MICRODISCECTOMY  12/11/2011   Procedure: LUMBAR LAMINECTOMY/DECOMPRESSION MICRODISCECTOMY;  Surgeon: Tobi Bastos, MD;  Location: [facility];  Service: Orthopedics;  Laterality: Left;  Decompressive Lumbar Laminectomy L5-S1 on Left  . POLYPECTOMY N/A 06/03/2014   Procedure: POLYPECTOMY;  Surgeon: Rogene Houston, MD;  Location: [facility];  Service: Endoscopy;  Laterality: N/A;  . right wrist surgery for pinched nerve    . trigger finger surgery  12-07-11   rt. middle trigger finger release    Current Outpatient Medications  Medication Sig Dispense Refill  . albuterol (PROVENTIL) (2.5 MG/3ML) 0.083% nebulizer solution INHALE THE CONTENTS OF 1 VIAL VIA NEBULIZER EVERY 4 HOURS AS NEEDED FOR WHEEZING OR  SHORTNESS OF BREATH (Patient taking differently: Take 2.5 mg by nebulization every 4 (four) hours as needed for wheezing or shortness of breath. ) 90 mL 1  . albuterol (VENTOLIN HFA) 108 (90 Base) MCG/ACT inhaler INHALE 2 PUFFS EVERY 4 HOURS AS NEEDED FOR WHEEZING OR SHORTNESS OF BREATH 18 g 3  . aspirin 325 MG tablet Take 1 tablet (325 mg total) by mouth daily. 30 tablet 3  .  atorvastatin (LIPITOR) 10 MG tablet TAKE 1 TABLET BY MOUTH ONCE DAILY AT 6:00 PM. 90 tablet 3  . Clobetasol Prop Emollient Base 0.05 % emollient cream Apply to affected areas BID 30 g 0  . desmopressin (DDAVP) 0.1 MG tablet TAKE 1 TABLET AT BEDTIME FOR BLADDER (Patient taking differently: Take 0.1 mg by mouth at bedtime. For bladder) 90 tablet 3  . dexlansoprazole (DEXILANT) 60 MG capsule Take 1 capsule (60 mg total) by mouth daily. 90 capsule 3  . diltiazem (CARDIZEM) 120 MG tablet TAKE 1 TABLET (120 MG TOTAL) BY MOUTH 2 (TWO) TIMES DAILY. INCREASED DOSE 09/27/2018 180 tablet 3  . DULoxetine (CYMBALTA) 30 MG capsule Take 1 capsule (30 mg total) by mouth daily. 90 capsule 3  . EASYMAX TEST test strip USE TO TEST ONCE DAILY. (Patient taking differently: See admin instructions. USE TO TEST ONCE DAILY) 100 each 3  . furosemide (LASIX) 40 MG tablet Take 40 mg by mouth daily.    Marland Kitchen gabapentin (NEURONTIN) 400 MG capsule Take 1 capsule (400 mg total) by mouth at bedtime. 90 capsule 3  . hydrALAZINE (APRESOLINE) 50 MG tablet Take 2 tablets (100 mg total) by mouth 3 (three) times daily. 540 tablet 3  . IBU 600 MG tablet TAKE 1 TABLET TWICE DAILY AS NEEDED 180 tablet 1  . nystatin (MYCOSTATIN/NYSTOP) powder APPLY  TOPICALLY TWICE DAILY AS NEEDED (Patient taking differently: Apply 1 Bottle topically 2 (two) times daily as needed (irritation). APPLY  TOPICALLY TWICE DAILY AS NEEDED) 60 g 2  . traZODone (DESYREL) 150 MG tablet TAKE 1 TABLET AT BEDTIME 90 tablet 3  . triamcinolone cream (KENALOG) 0.1 % Apply 1 application topically 2 (two) times daily. 30 g 0   No current facility-administered medications for this visit.   Allergies:  Hydrocodone-acetaminophen, Oxycodone-acetaminophen, Sulfa antibiotics, Nystatin, Percocet [oxycodone-acetaminophen], Vesicare [solifenacin], and Vicodin [hydrocodone-acetaminophen]   Social History: The patient  reports that she has been smoking cigarettes. She started smoking  about 49 years ago. She has a 96.00 pack-year smoking history. She has never used smokeless tobacco. She reports that she does not drink alcohol or use drugs.   Family History: The patient's family history includes Cancer in her father and mother.   ROS:  Please see the history of present illness. Otherwise, complete review of systems is positive for none.  All other systems are reviewed and negative.   Physical Exam: VS:  There were no vitals taken for this visit., BMI There is no height or weight on file to calculate BMI.  Wt Readings from Last 3 Encounters:  06/23/19 220 lb (99.8 kg)  06/06/19 223 lb (101.2 kg)  05/20/19 222 lb (100.7 kg)    General: Patient appears comfortable at rest. Neck: Supple, no elevated JVP or carotid bruits, no thyromegaly. Lungs: Clear to auscultation, nonlabored breathing at rest. Cardiac: Regular rate and rhythm, no S3 or significant systolic murmur, no pericardial rub. Extremities: No pitting edema, distal pulses 2+. Skin: Warm and dry. Musculoskeletal: No kyphosis. Neuropsychiatric: Alert and oriented x3, affect grossly appropriate.  ECG:  An ECG dated 04/21/2019 was personally reviewed today and demonstrated:  Normal sinus rhythm rate of 65  Recent Labwork: 09/30/2018: TSH 1.571 04/18/2019: ALT 14; AST 16; BUN 22; Creatinine, Ser 1.56; Hemoglobin 13.9; Platelets 302; Potassium 4.0; Sodium 133     Component Value Date/Time   CHOL 110 09/30/2018 0613   TRIG 37 09/30/2018 0613   HDL 49 09/30/2018 0613   CHOLHDL 2.2 09/30/2018 0613   VLDL 7 09/30/2018 0613   LDLCALC 54 09/30/2018 0613   LDLCALC 55 08/27/2018 1035    Other Studies Reviewed Today:  Recent non telemetry monitor  Report Analysis: Atrial Flutter with Variable Conduction w/Artifact  Carotid artery duplex 05/02/2019 IMPRESSION: 1. Bilateral carotid bifurcation and proximal ICA plaque resulting in 50-69% diameter stenosis. Little change from prior study. 2. Origin stenoses of  bilateral external carotid arteries. 3. Antegrade flow in bilateral vertebral arteries.  Renal artery [place] 04/29/2019 Summary: Renal: Mesenteric: 70 to 99% stenosis in the celiac artery and superior mesenteric artery. Right: 1-59% stenosis of the right renal artery. Abnormal right Resistive Index. Normal size right kidney. Left: No evidence of left renal artery stenosis. Abnormal left Resisitve Index. Normal size of left kidney.  08/2017 echo Study Conclusions  - Left ventricle: The cavity size was normal. Wall thickness was normal. Systolic function was normal. The estimated ejection fraction was in the range of 55% to 60%. Wall motion was normal; there were no regional wall motion abnormalities. Left ventricular diastolic function parameters were normal. - Aortic valve: Mildly calcified annulus. Mildly thickened leaflets. Valve area (VTI): 2.58 cm^2. Valve area (Vmax): 2.54 cm^2. - Mitral valve: Mildly calcified annulus. Normal thickness leaflets . - Left atrium: The atrium was mildly dilated. - Technically adequate study.  Jan 2020 event monitor  14 day event monitor  Min HR 46, Max HR 123, Avg HR 73. Min HR occurred in very early AM hours presumably while sleeping  No symptoms reported  Telemetry tracings show sinus rhythm  No significant arrhythmias   Assessment and Plan:  1. Essential hypertension   2. Syncope, unspecified syncope type   3. Atrial flutter, unspecified type ([place])   4. 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 Right renal artery stenosis ([facility])    1. Essential hypertension Patient recent complaining of significant increases in blood pressure.  She had tried ACEs and arms in the past with decreased renal function.  She was started on hydralazine.  Recent increase in hydralazine to 100 mg 3 times daily yesterday by Dr. Harl Bowie.  Blood pressure continues to  remain elevated today 182/74.  We will have her back in 1 week to see how her blood pressures are doing.  May need to add or adjust antihypertensive therapy at that time  2. Syncope, unspecified syncope type Patient denies any subsequent syncopal or near syncopal episodes since the previous event in January.  We will continue to monitor.   3. Atrial flutter, unspecified type ([place]) Recent nontelemetry monitor showed evidence of atrial flutter.  Start metoprolol 25 mg p.o. twice daily, stop aspirin, start Eliquis 5 mg p.o. twice daily.  4. Bilateral carotid artery stenosis She has bilateral carotid artery stenosis on recent carotid artery study of 50 to 69% unchanged from previous study.  Reinforced risk factor management such as smoking.  We will continue to monitor with further carotid artery studies..  She is followed by vascular  5. Stenosis of  celiac artery ([facility]) Recent vascular ultrasound of mesenteric arteries showed 70 to 99% stenosis.  Patient denies any recent weight loss, postprandial pain, or diarrhea.  We will manage medically with risk factor control blood pressure control as well as statin therapy.  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  Patient denies any recent weight loss, postprandial pain, or diarrhea.  We will manage medically with risk factor control blood pressure control as well as statin therapy.  She is followed by vascular   7. Right renal artery stenosis ([facility]) She has right renal artery stenosis found on recent ultrasound of 1 to 59% stenosis..  No evidence of left renal artery stenosis.  This likely accounts for sudden increase in blood pressures and decreased renal function after attempting ACE and ARB therapy in the past.  We will continue with medical management for hypertension and risk factor management.  She is followed by vascular  8.   Smoking Patient states she knows she needs to stop smoking and has been told by vascular physician this is a big contributor to her vascular disease.  Patient states to have 1 more pack left and after that I will stop  Medication Adjustments/Labs and Tests Ordered: Current medicines are reviewed at length with the patient today.  Concerns regarding medicines are outlined above.   Disposition: Follow-up with Dr Harl Bowie or APP 1 week  Signed, Levell July, NP 06/30/2019 9:28 PM    [facility] [place], [place], [place] [address] Phone: [phone]; Fax: [phone]

## 2019-06-30 NOTE — Telephone Encounter (Signed)
Patient informed and verbalized understanding of plan. 

## 2019-06-30 NOTE — Telephone Encounter (Signed)
216/103 took BP at 8:00 am after her medication  203/100 taken at 9:00 am  She is really concerned about her BP. She has had bad headaches and scared her black out spells will start back  She had an appointment scheduled for today but stated it was cancelled Friday and rescheduled for Sept 2021

## 2019-06-30 NOTE — Telephone Encounter (Signed)
Increase hydralazine to 100mg  tid, will evaluate tomorrow at [facility] appt   Reinaldo Berber MD

## 2019-07-01 ENCOUNTER — Ambulatory Visit (INDEPENDENT_AMBULATORY_CARE_PROVIDER_SITE_OTHER): Payer: Medicare HMO | Admitting: Family Medicine

## 2019-07-01 ENCOUNTER — Encounter: Payer: Self-pay | Admitting: Family Medicine

## 2019-07-01 ENCOUNTER — Other Ambulatory Visit: Payer: Self-pay

## 2019-07-01 VITALS — BP 182/74 | HR 71 | Ht 65.0 in | Wt 217.0 lb

## 2019-07-01 DIAGNOSIS — I4892 Unspecified atrial flutter: Secondary | ICD-10-CM

## 2019-07-01 DIAGNOSIS — I774 Celiac artery compression syndrome: Secondary | ICD-10-CM | POA: Diagnosis not present

## 2019-07-01 DIAGNOSIS — I701 Atherosclerosis of renal artery: Secondary | ICD-10-CM

## 2019-07-01 DIAGNOSIS — K551 Chronic vascular disorders of intestine: Secondary | ICD-10-CM | POA: Diagnosis not present

## 2019-07-01 DIAGNOSIS — I10 Essential (primary) hypertension: Secondary | ICD-10-CM | POA: Diagnosis not present

## 2019-07-01 DIAGNOSIS — I771 Stricture of artery: Secondary | ICD-10-CM

## 2019-07-01 DIAGNOSIS — I6523 Occlusion and stenosis of bilateral carotid arteries: Secondary | ICD-10-CM

## 2019-07-01 DIAGNOSIS — R55 Syncope and collapse: Secondary | ICD-10-CM

## 2019-07-01 MED ORDER — APIXABAN 5 MG PO TABS: 5.0000 mg | ORAL_TABLET | Freq: Two times a day (BID) | ORAL | 0 refills | Status: DC

## 2019-07-01 MED ORDER — APIXABAN 5 MG PO TABS: 5.0000 mg | ORAL_TABLET | Freq: Two times a day (BID) | ORAL | 3 refills | Status: DC

## 2019-07-01 MED ORDER — METOPROLOL TARTRATE 25 MG PO TABS: 25.0000 mg | ORAL_TABLET | Freq: Two times a day (BID) | ORAL | 3 refills | Status: DC

## 2019-07-01 NOTE — Patient Instructions (Signed)
Your physician recommends that you schedule a follow-up appointment in: [place], NP  Your physician has recommended you make the following change in your medication:   STOP ASPIRIN   START ELIQUIS 5 MG TWICE DAILY   START METOPROLOL 25 MG TWICE DAILY   Thank you for choosing [facility]!!

## 2019-07-03 ENCOUNTER — Telehealth: Payer: Self-pay | Admitting: *Deleted

## 2019-07-03 MED ORDER — LABETALOL HCL 200 MG PO TABS: 200.0000 mg | ORAL_TABLET | Freq: Two times a day (BID) | ORAL | 1 refills | Status: DC

## 2019-07-03 NOTE — Telephone Encounter (Signed)
Patient informed and verbalized understanding of plan. 

## 2019-07-03 NOTE — Telephone Encounter (Signed)
8:00 am 192/97 HR 53 12:30 pm 180/155 HR 59 Reports that her vision is blurry and unchanged from recent visit Medications reviewed

## 2019-07-03 NOTE — Telephone Encounter (Signed)
Stop metoprolol, start labetalol 200mg  bid. Update [place] again on Monday with bp's and HRs   Zandra Abts MD

## 2019-07-07 NOTE — Progress Notes (Addendum)
Cardiology Office Note  Date: 07/08/2019   ID: Laquaya, Kirschbaum 21-Apr-1950, MRN [id number]  PCP:  Alycia Rossetti, MD  Cardiologist:  Carlyle Dolly, MD Electrophysiologist:  None   Chief Complaint: Hypertension, palpitations, syncope  History of Present Illness: LUV CAIRES is a 69 y.o. female with a history of hypertension, palpitations, syncope.  Last saw Dr. Harl Bowie 06/06/2019.  Patient had experienced a recent syncopal episode history concerning for possible cardiogenic syncope on 2/29/2021.  No prodromal symptoms.  Patient impacted her face with severe bruising and chest pain  Recent non telemetry monitor showed evidence of atrial flutter. Recent carotid artery duplex demonstrated bilateral ICA stenosis 50-69%.   She was being treated for HTN with ACEI and subsequent ARB. Creatinine was elevated on ACEI and ARB therapy. These medications were subsequently stopped d/t worsening renal function. Renal artery ultrasound  showed 1 to 59% stenosis of right renal artery.  Left showed no evidence of left renal artery stenosis.  Mesenteric ultrasound showed 70 to 99% stenosis in the celiac artery and superior mesenteric arteries.  She presents today with much better blood pressures.  She called on April 15. 2021 with continued elevated blood pressure.  Metoprolol was stopped and she was placed on labetalol 200 mg p.o. twice daily.  She presents today with blood pressure of 128/50 and a heart rate of 48.  She states this morning around 9 AM she started having some chest pressure.  She rates the chest pressure 8 out of 10 in character.  States she has no radiation or associated nausea, vomiting, or diaphoresis. She states "I just don't feel right".    An EKG was performed by nursing staff which shows a sinus bradycardia with a rate of 48. no acute ST or T wave changes noted. continues to smoke 1 pack of cigarettes per day.  Past Medical History:  Diagnosis Date   Anxiety     Arthritis 12-07-11   arthritis,DDD,spinal stenosis   Chronic back pain    COPD (chronic obstructive pulmonary disease) ([place]) 12-07-11   pt. uses nebulizer as needed and inhaler daily   Fibromyalgia    skin cancer   GERD (gastroesophageal reflux disease)    Hemorrhoids    Hypertension 12-07-11   presently no meds   Normal cardiac stress test 06/2009   Shortness of breath 12-07-11   less now, occ wheezes   Skin cancer    melanoma.  face 2014    Past Surgical History:  Procedure Laterality Date   ABDOMINAL HYSTERECTOMY     BACK SURGERY  12-07-11   2'10 L5 x2   COLONOSCOPY WITH PROPOFOL N/A 06/03/2014   Procedure: COLONOSCOPY WITH PROPOFOL; IN CECUM AT 0750; TOTAL WITHDRAWAL TIME 16 MINUTES;  Surgeon: Rogene Houston, MD;  Location: [facility];  Service: Endoscopy;  Laterality: N/A;   left ankle surgery     tendonitis   LUMBAR LAMINECTOMY/DECOMPRESSION MICRODISCECTOMY  12/11/2011   Procedure: LUMBAR LAMINECTOMY/DECOMPRESSION MICRODISCECTOMY;  Surgeon: Tobi Bastos, MD;  Location: [facility];  Service: Orthopedics;  Laterality: Left;  Decompressive Lumbar Laminectomy L5-S1 on Left   POLYPECTOMY N/A 06/03/2014   Procedure: POLYPECTOMY;  Surgeon: Rogene Houston, MD;  Location: [facility];  Service: Endoscopy;  Laterality: N/A;   right wrist surgery for pinched nerve     trigger finger surgery  12-07-11   rt. middle trigger finger release    Current Outpatient Medications  Medication Sig Dispense Refill   albuterol (PROVENTIL) (2.5 MG/3ML)  0.083% nebulizer solution INHALE THE CONTENTS OF 1 VIAL VIA NEBULIZER EVERY 4 HOURS AS NEEDED FOR WHEEZING OR SHORTNESS OF BREATH (Patient taking differently: Take 2.5 mg by nebulization every 4 (four) hours as needed for wheezing or shortness of breath. ) 90 mL 1   albuterol (VENTOLIN HFA) 108 (90 Base) MCG/ACT inhaler INHALE 2 PUFFS EVERY 4 HOURS AS NEEDED FOR WHEEZING OR SHORTNESS OF BREATH 18 g 3   apixaban (ELIQUIS) 5 MG TABS tablet Take 1  tablet (5 mg total) by mouth 2 (two) times daily. 14 tablet 0   atorvastatin (LIPITOR) 10 MG tablet TAKE 1 TABLET BY MOUTH ONCE DAILY AT 6:00 PM. 90 tablet 3   Clobetasol Prop Emollient Base 0.05 % emollient cream Apply to affected areas BID 30 g 0   desmopressin (DDAVP) 0.1 MG tablet TAKE 1 TABLET AT BEDTIME FOR BLADDER (Patient taking differently: Take 0.1 mg by mouth at bedtime. For bladder) 90 tablet 3   dexlansoprazole (DEXILANT) 60 MG capsule Take 1 capsule (60 mg total) by mouth daily. 90 capsule 3   diltiazem (CARDIZEM) 120 MG tablet TAKE 1 TABLET (120 MG TOTAL) BY MOUTH 2 (TWO) TIMES DAILY. INCREASED DOSE 09/27/2018 180 tablet 3   DULoxetine (CYMBALTA) 30 MG capsule Take 1 capsule (30 mg total) by mouth daily. 90 capsule 3   EASYMAX TEST test strip USE TO TEST ONCE DAILY. (Patient taking differently: See admin instructions. USE TO TEST ONCE DAILY) 100 each 3   furosemide (LASIX) 40 MG tablet Take 40 mg by mouth daily.     gabapentin (NEURONTIN) 400 MG capsule Take 1 capsule (400 mg total) by mouth at bedtime. 90 capsule 3   hydrALAZINE (APRESOLINE) 50 MG tablet Take 2 tablets (100 mg total) by mouth 3 (three) times daily. 540 tablet 3   IBU 600 MG tablet TAKE 1 TABLET TWICE DAILY AS NEEDED 180 tablet 1   labetalol (NORMODYNE) 200 MG tablet Take 1 tablet (200 mg total) by mouth 2 (two) times daily. 180 tablet 1   nystatin (MYCOSTATIN/NYSTOP) powder APPLY  TOPICALLY TWICE DAILY AS NEEDED (Patient taking differently: Apply 1 Bottle topically 2 (two) times daily as needed (irritation). APPLY  TOPICALLY TWICE DAILY AS NEEDED) 60 g 2   traZODone (DESYREL) 150 MG tablet TAKE 1 TABLET AT BEDTIME 90 tablet 3   triamcinolone cream (KENALOG) 0.1 % Apply 1 application topically 2 (two) times daily. 30 g 0   No current facility-administered medications for this visit.   Allergies:  Hydrocodone-acetaminophen, Oxycodone-acetaminophen, Sulfa antibiotics, Nystatin, Percocet  [oxycodone-acetaminophen], Vesicare [solifenacin], and Vicodin [hydrocodone-acetaminophen]   Social History: The patient  reports that she has been smoking cigarettes. She started smoking about 49 years ago. She has a 96.00 pack-year smoking history. She has never used smokeless tobacco. She reports that she does not drink alcohol or use drugs.   Family History: The patient's family history includes Cancer in her father and mother.   ROS:  Please see the history of present illness. Otherwise, complete review of systems is positive for none.  All other systems are reviewed and negative.   Physical Exam: VS:  BP (!) 128/50    Pulse (!) 49    Ht 5\' 5"  (1.651 m)    Wt 218 lb (98.9 kg)    BMI 36.28 kg/m , BMI Body mass index is 36.28 kg/m.  Wt Readings from Last 3 Encounters:  07/08/19 218 lb (98.9 kg)  07/01/19 217 lb (98.4 kg)  06/23/19 220 lb (99.8 kg)  General: Patient appears comfortable at rest. Neck: Supple, no elevated JVP or carotid bruits, no thyromegaly. Lungs: Clear to auscultation, nonlabored breathing at rest. Cardiac: Regular rate and rhythm, no S3 or significant systolic murmur, no pericardial rub. Extremities: No pitting edema, distal pulses 2+. Skin: Warm and dry. Musculoskeletal: No kyphosis. Neuropsychiatric: Alert and oriented x3, affect grossly appropriate.  ECG:  An ECG dated 04/21/2019 was personally reviewed today and demonstrated:  Normal sinus rhythm rate of 65  Recent Labwork: 09/30/2018: TSH 1.571 04/18/2019: ALT 14; AST 16; BUN 22; Creatinine, Ser 1.56; Hemoglobin 13.9; Platelets 302; Potassium 4.0; Sodium 133     Component Value Date/Time   CHOL 110 09/30/2018 0613   TRIG 37 09/30/2018 0613   HDL 49 09/30/2018 0613   CHOLHDL 2.2 09/30/2018 0613   VLDL 7 09/30/2018 0613   LDLCALC 54 09/30/2018 0613   LDLCALC 55 08/27/2018 1035    Other Studies Reviewed Today:  Recent non telemetry monitor  Report Analysis: Atrial Flutter with Variable Conduction  w/Artifact  Carotid artery duplex 05/02/2019 IMPRESSION: 1. Bilateral carotid bifurcation and proximal ICA plaque resulting in 50-69% diameter stenosis. Little change from prior study. 2. Origin stenoses of bilateral external carotid arteries. 3. Antegrade flow in bilateral vertebral arteries.  Renal artery [place] 04/29/2019 Summary: Renal: Mesenteric: 70 to 99% stenosis in the celiac artery and superior mesenteric artery. Right: 1-59% stenosis of the right renal artery. Abnormal right Resistive Index. Normal size right kidney. Left: No evidence of left renal artery stenosis. Abnormal left Resisitve Index. Normal size of left kidney.  08/2017 echo Study Conclusions  - Left ventricle: The cavity size was normal. Wall thickness was normal. Systolic function was normal. The estimated ejection fraction was in the range of 55% to 60%. Wall motion was normal; there were no regional wall motion abnormalities. Left ventricular diastolic function parameters were normal. - Aortic valve: Mildly calcified annulus. Mildly thickened leaflets. Valve area (VTI): 2.58 cm^2. Valve area (Vmax): 2.54 cm^2. - Mitral valve: Mildly calcified annulus. Normal thickness leaflets . - Left atrium: The atrium was mildly dilated. - Technically adequate study.  Jan 2020 event monitor  14 day event monitor  Min HR 46, Max HR 123, Avg HR 73. Min HR occurred in very early AM hours presumably while sleeping  No symptoms reported  Telemetry tracings show sinus rhythm  No significant arrhythmias   Assessment and Plan:  1. Essential HTN                                                                                                Patient having significant increases in blood pressure.  She had tried ACEs and ARBS in the past with decreased renal function.  She was started on hydralazine.  She called on July 03, 2019 with continued elevated blood pressures.  Her metoprolol was stopped and she  was started on labetalol 200 mg p.o. twice daily by Dr. Harl Bowie.  Blood pressure today on arrival was 128/50 with a heart rate of 48.  Continue labetalol 200 mg p.o. twice daily, continue Cardizem 120 mg tablet p.o. twice daily and hydralazine  100 mg po tid.  2. Syncope, unspecified syncope type Patient denies any subsequent syncopal or near syncopal episodes since the previous event in January.  We will continue to monitor.   3. Atrial flutter, unspecified type ([place]) Recent non-telemetry monitor showed evidence of atrial flutter.  start Eliquis 5 mg p.o. twice daily.  EKG today shows sinus bradycardia with a rate of 48.  No evidence of atrial fibrillation or flutter.  If subsequent EKG's show no recurrence of atrial flutter may be able to stop the Eliquis.  She denies any evidence of bleeding in stool or urine. Continue Labetolol 200 mg po bid  4. Bilateral carotid artery stenosis She has bilateral carotid artery stenosis on recent carotid artery study of 50 to 69% unchanged from previous study.  Reinforced risk factor management such as smoking.  We will continue to monitor with further carotid artery studies..  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  Patient denies any recent weight loss, postprandial pain, or diarrhea.  We will manage medically with risk factor control blood pressure control as well as statin therapy.  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  Patient denies any recent weight loss, postprandial pain, or diarrhea.  We will manage medically with risk factor control blood pressure control as well as statin therapy.  She is followed by vascular.   7. Right renal artery stenosis ([facility]) She has right renal artery stenosis found on recent ultrasound of 1 to 59% stenosis..  No  evidence of left renal artery stenosis.  This likely accounts for sudden increase in blood pressures and decreased renal function after attempting ACE and ARB therapy in the past.  We will continue with medical management for hypertension and risk factor management.  She is followed by vascular  8.  Smoking Patient states she knows she needs to stop smoking and has been told by vascular physician this is a big contributor to her vascular disease.  Patient continues to smoke 1 pack/day of cigarettes stating "you did not expect me to stop did you?".  Advised her she needs to stop.  9. Chest pressure beginning around 9 AM this morning.  States she has no associated radiation, nausea, vomiting, or diaphoresis.  She rates the pressure/pain 8 out of 10.  States she has had no previous episodes of chest pain.  Start nitroglycerin sublingual prn. Advised patient on how to take the SL NTG. She understands the directions. Informed her if she still has pain after the 3rd dose of NTG to call EMS and go the emergency room  Get a Lexiscan Myoview stress test. EKG today showed SB rate of 48 with no acute ST or T wave changes.  10.  Dyspnea on exertion. Complaining of increased dyspnea on exertion associated with recent onset of chest pressure.  She does have an audible systolic ejection murmur heard best at left upper sternal border.  Get a repeat echocardiogram   Medication Adjustments/Labs and Tests Ordered: Current medicines are reviewed at length with the patient today.  Concerns regarding medicines are outlined above.   Disposition: Follow-up with Dr Harl Bowie or APP 1 month  Signed, Levell July, NP 07/08/2019 12:09 PM    [facility] [place], [place], [place] [address] Phone: [phone]; Fax: [phone]

## 2019-07-08 ENCOUNTER — Ambulatory Visit (INDEPENDENT_AMBULATORY_CARE_PROVIDER_SITE_OTHER): Payer: Medicare HMO | Admitting: Family Medicine

## 2019-07-08 ENCOUNTER — Encounter: Payer: Self-pay | Admitting: Family Medicine

## 2019-07-08 ENCOUNTER — Telehealth: Payer: Self-pay | Admitting: Family Medicine

## 2019-07-08 ENCOUNTER — Encounter: Payer: Self-pay | Admitting: *Deleted

## 2019-07-08 ENCOUNTER — Telehealth: Payer: Self-pay | Admitting: *Deleted

## 2019-07-08 ENCOUNTER — Other Ambulatory Visit: Payer: Self-pay

## 2019-07-08 VITALS — BP 128/50 | HR 49 | Ht 65.0 in | Wt 218.0 lb

## 2019-07-08 DIAGNOSIS — R0600 Dyspnea, unspecified: Secondary | ICD-10-CM

## 2019-07-08 DIAGNOSIS — R0609 Other forms of dyspnea: Secondary | ICD-10-CM

## 2019-07-08 DIAGNOSIS — I4891 Unspecified atrial fibrillation: Secondary | ICD-10-CM | POA: Diagnosis not present

## 2019-07-08 DIAGNOSIS — I701 Atherosclerosis of renal artery: Secondary | ICD-10-CM | POA: Diagnosis not present

## 2019-07-08 DIAGNOSIS — K551 Chronic vascular disorders of intestine: Secondary | ICD-10-CM | POA: Diagnosis not present

## 2019-07-08 DIAGNOSIS — R079 Chest pain, unspecified: Secondary | ICD-10-CM

## 2019-07-08 DIAGNOSIS — I774 Celiac artery compression syndrome: Secondary | ICD-10-CM | POA: Diagnosis not present

## 2019-07-08 MED ORDER — NITROGLYCERIN 0.4 MG SL SUBL: 0.4000 mg | SUBLINGUAL_TABLET | SUBLINGUAL | 3 refills | Status: DC | PRN

## 2019-07-08 NOTE — Telephone Encounter (Signed)
LM for pt to return call   Per Dr Harl Bowie for [facility] can we lower her diltiazem to 60mg  bid, i would keep her labetalol where it is because its better for blood pressure. Verify her diltiazem is a tablet that can be cut in half, i believe it is as opposed to a capsuse

## 2019-07-08 NOTE — Addendum Note (Signed)
Addended by: Julian Hy T on: 07/08/2019 02:32 PM   Modules accepted: Orders

## 2019-07-08 NOTE — Telephone Encounter (Signed)
Pre-cert Verification for the following procedure    Lexiscan & Echo   DATE:  07/23/2019  LOCATION:  [facility]

## 2019-07-08 NOTE — Patient Instructions (Addendum)
Your physician recommends that you schedule a follow-up appointment in: [place] recommends that you continue on your current medications as directed. Please refer to the Current Medication list given to you today.  Your physician has requested that you have an echocardiogram. Echocardiography is a painless test that uses sound waves to create images of your heart. It provides your doctor with information about the size and shape of your heart and how well your heart's chambers and valves are working. This procedure takes approximately one hour. There are no restrictions for this procedure.  Your physician has requested that you have a lexiscan myoview. For further information please visit [email or web address]. Please follow instruction sheet, as given.  Thank you for choosing [facility]!!

## 2019-07-09 NOTE — Telephone Encounter (Signed)
Pt verified that diltiazem was a tablet and could cut in half - voiced understanding - updated medication list

## 2019-07-14 DIAGNOSIS — Z79899 Other long term (current) drug therapy: Secondary | ICD-10-CM | POA: Diagnosis not present

## 2019-07-14 DIAGNOSIS — M542 Cervicalgia: Secondary | ICD-10-CM | POA: Diagnosis not present

## 2019-07-14 DIAGNOSIS — I6529 Occlusion and stenosis of unspecified carotid artery: Secondary | ICD-10-CM | POA: Diagnosis not present

## 2019-07-14 DIAGNOSIS — M545 Low back pain: Secondary | ICD-10-CM | POA: Diagnosis not present

## 2019-07-14 DIAGNOSIS — M461 Sacroiliitis, not elsewhere classified: Secondary | ICD-10-CM | POA: Diagnosis not present

## 2019-07-16 ENCOUNTER — Telehealth (INDEPENDENT_AMBULATORY_CARE_PROVIDER_SITE_OTHER): Payer: Medicare HMO | Admitting: Family Medicine

## 2019-07-16 ENCOUNTER — Other Ambulatory Visit: Payer: Self-pay

## 2019-07-16 ENCOUNTER — Encounter: Payer: Self-pay | Admitting: Family Medicine

## 2019-07-16 VITALS — BP 160/79 | HR 63

## 2019-07-16 DIAGNOSIS — J441 Chronic obstructive pulmonary disease with (acute) exacerbation: Secondary | ICD-10-CM

## 2019-07-16 DIAGNOSIS — J019 Acute sinusitis, unspecified: Secondary | ICD-10-CM | POA: Diagnosis not present

## 2019-07-16 MED ORDER — PREDNISONE 20 MG PO TABS: 40.0000 mg | ORAL_TABLET | Freq: Every day | ORAL | 0 refills | Status: DC

## 2019-07-16 MED ORDER — DM-GUAIFENESIN ER 30-600 MG PO TB12: 1.0000 | ORAL_TABLET | Freq: Two times a day (BID) | ORAL | 0 refills | Status: DC

## 2019-07-16 MED ORDER — DOXYCYCLINE HYCLATE 100 MG PO TABS: 100.0000 mg | ORAL_TABLET | Freq: Two times a day (BID) | ORAL | 0 refills | Status: DC

## 2019-07-16 NOTE — Progress Notes (Signed)
Virtual Visit via Telephone Note  I connected with Paula Long on 07/16/19 at  2:00 PM EDT by telephone and verified that I am speaking with the correct person using two identifiers.     Pt location: at home   Physician location:  In office, [facility], Vic Blackbird MD     On call: patient and physician   I discussed the limitations, risks, security and privacy concerns of performing an evaluation and management service by telephone and the availability of in person appointments. I also discussed with the patient that there may be a patient responsible charge related to this service. The patient expressed understanding and agreed to proceed.   History of Present Illness:  Pt called in with cough with production of yellow mucous. This has been present 3 weeks. She has not covid vaccine No fever. She has had hot sweats but this is not new, no chills, feels a little weak She has some bleeding some in right nostril and it feels congested. She has even had a nosebleed at night No sinus tenderness. But does have Headache over eyes  She gets occ SOB, but no wheezing  She has had a little nausea and vomiting a couple days ago, after mouth starting watering Decrease appetite but normal taste /smell    She does continue to smoke  Sister was sick recently    Observations/Objective: NAD noted , work of breathing noted upon speaking in full sentences cough not heard.  Assessment and Plan: Concern for COPD exacerbation along with sinusitis symptoms.  She has not been vaccinated and has been around sick family members I would recommend she get Covid testing as well.  The meantime we will start her on doxycycline to cover infection we will also give her prednisone 40 mg x 5 days and Mucinex DM. Red flags and when to go to the emergency room.  She will continue her blood pressure medicine per cardiology's recommendations  Follow Up Instructions:    I discussed the assessment  and treatment plan with the patient. The patient was provided an opportunity to ask questions and all were answered. The patient agreed with the plan and demonstrated an understanding of the instructions.   The patient was advised to call back or seek an in-person evaluation if the symptoms worsen or if the condition fails to improve as anticipated.  I provided 9 minutes of non-face-to-face time during this encounter. End Time 2:09pm  Vic Blackbird, MD

## 2019-07-17 ENCOUNTER — Ambulatory Visit: Payer: Medicare HMO | Attending: Internal Medicine

## 2019-07-17 DIAGNOSIS — Z20822 Contact with and (suspected) exposure to covid-19: Secondary | ICD-10-CM | POA: Diagnosis not present

## 2019-07-18 ENCOUNTER — Other Ambulatory Visit: Payer: Self-pay

## 2019-07-18 ENCOUNTER — Emergency Department (HOSPITAL_COMMUNITY): Payer: Medicare HMO

## 2019-07-18 ENCOUNTER — Encounter (HOSPITAL_COMMUNITY): Payer: Self-pay | Admitting: *Deleted

## 2019-07-18 ENCOUNTER — Telehealth: Payer: Self-pay | Admitting: Family Medicine

## 2019-07-18 ENCOUNTER — Other Ambulatory Visit: Payer: Self-pay | Admitting: Family Medicine

## 2019-07-18 ENCOUNTER — Observation Stay (HOSPITAL_COMMUNITY)
Admission: EM | Admit: 2019-07-18 | Discharge: 2019-07-19 | Disposition: A | Discharge status: Home / Self Care | Payer: Medicare HMO | Attending: Internal Medicine | Admitting: Internal Medicine

## 2019-07-18 DIAGNOSIS — J449 Chronic obstructive pulmonary disease, unspecified: Secondary | ICD-10-CM | POA: Diagnosis not present

## 2019-07-18 DIAGNOSIS — R0789 Other chest pain: Secondary | ICD-10-CM | POA: Diagnosis not present

## 2019-07-18 DIAGNOSIS — I10 Essential (primary) hypertension: Secondary | ICD-10-CM | POA: Diagnosis not present

## 2019-07-18 DIAGNOSIS — K219 Gastro-esophageal reflux disease without esophagitis: Secondary | ICD-10-CM | POA: Diagnosis not present

## 2019-07-18 DIAGNOSIS — K551 Chronic vascular disorders of intestine: Secondary | ICD-10-CM

## 2019-07-18 DIAGNOSIS — I4892 Unspecified atrial flutter: Secondary | ICD-10-CM | POA: Diagnosis present

## 2019-07-18 DIAGNOSIS — Z8673 Personal history of transient ischemic attack (TIA), and cerebral infarction without residual deficits: Secondary | ICD-10-CM | POA: Insufficient documentation

## 2019-07-18 DIAGNOSIS — F17210 Nicotine dependence, cigarettes, uncomplicated: Secondary | ICD-10-CM | POA: Diagnosis present

## 2019-07-18 DIAGNOSIS — N183 Chronic kidney disease, stage 3 unspecified: Secondary | ICD-10-CM | POA: Diagnosis not present

## 2019-07-18 DIAGNOSIS — I160 Hypertensive urgency: Secondary | ICD-10-CM | POA: Diagnosis not present

## 2019-07-18 DIAGNOSIS — M549 Dorsalgia, unspecified: Principal | ICD-10-CM | POA: Insufficient documentation

## 2019-07-18 DIAGNOSIS — R079 Chest pain, unspecified: Secondary | ICD-10-CM | POA: Diagnosis present

## 2019-07-18 DIAGNOSIS — Z720 Tobacco use: Secondary | ICD-10-CM | POA: Diagnosis present

## 2019-07-18 DIAGNOSIS — E1122 Type 2 diabetes mellitus with diabetic chronic kidney disease: Secondary | ICD-10-CM | POA: Diagnosis not present

## 2019-07-18 DIAGNOSIS — R001 Bradycardia, unspecified: Secondary | ICD-10-CM | POA: Diagnosis not present

## 2019-07-18 DIAGNOSIS — Z7984 Long term (current) use of oral hypoglycemic drugs: Secondary | ICD-10-CM | POA: Diagnosis not present

## 2019-07-18 DIAGNOSIS — J418 Mixed simple and mucopurulent chronic bronchitis: Secondary | ICD-10-CM | POA: Diagnosis not present

## 2019-07-18 DIAGNOSIS — I129 Hypertensive chronic kidney disease with stage 1 through stage 4 chronic kidney disease, or unspecified chronic kidney disease: Secondary | ICD-10-CM | POA: Insufficient documentation

## 2019-07-18 DIAGNOSIS — I483 Typical atrial flutter: Secondary | ICD-10-CM | POA: Diagnosis not present

## 2019-07-18 DIAGNOSIS — Z20822 Contact with and (suspected) exposure to covid-19: Secondary | ICD-10-CM | POA: Diagnosis not present

## 2019-07-18 DIAGNOSIS — I4891 Unspecified atrial fibrillation: Secondary | ICD-10-CM | POA: Diagnosis not present

## 2019-07-18 DIAGNOSIS — M545 Low back pain: Secondary | ICD-10-CM

## 2019-07-18 DIAGNOSIS — R072 Precordial pain: Secondary | ICD-10-CM | POA: Diagnosis not present

## 2019-07-18 DIAGNOSIS — G8929 Other chronic pain: Secondary | ICD-10-CM | POA: Diagnosis not present

## 2019-07-18 DIAGNOSIS — Z79899 Other long term (current) drug therapy: Secondary | ICD-10-CM | POA: Diagnosis not present

## 2019-07-18 DIAGNOSIS — E1121 Type 2 diabetes mellitus with diabetic nephropathy: Secondary | ICD-10-CM

## 2019-07-18 DIAGNOSIS — J441 Chronic obstructive pulmonary disease with (acute) exacerbation: Secondary | ICD-10-CM

## 2019-07-18 DIAGNOSIS — E7800 Pure hypercholesterolemia, unspecified: Secondary | ICD-10-CM

## 2019-07-18 LAB — CBC
HCT: 40.6 % (ref 36.0–46.0)
Hemoglobin: 13.2 g/dL (ref 12.0–15.0)
MCH: 31.2 pg (ref 26.0–34.0)
MCHC: 32.5 g/dL (ref 30.0–36.0)
MCV: 96 fL (ref 80.0–100.0)
Platelets: 270 10*3/uL (ref 150–400)
RBC: 4.23 MIL/uL (ref 3.87–5.11)
RDW: 15.4 % (ref 11.5–15.5)
WBC: 10.9 10*3/uL — ABNORMAL HIGH (ref 4.0–10.5)
nRBC: 0 % (ref 0.0–0.2)

## 2019-07-18 LAB — BASIC METABOLIC PANEL
Anion gap: 12 (ref 5–15)
BUN: 37 mg/dL — ABNORMAL HIGH (ref 8–23)
CO2: 24 mmol/L (ref 22–32)
Calcium: 9.2 mg/dL (ref 8.9–10.3)
Chloride: 99 mmol/L (ref 98–111)
Creatinine, Ser: 1.74 mg/dL — ABNORMAL HIGH (ref 0.44–1.00)
GFR calc Af Amer: 35 mL/min — ABNORMAL LOW (ref >60)
GFR calc non Af Amer: 30 mL/min — ABNORMAL LOW (ref >60)
Glucose, Bld: 99 mg/dL (ref 70–99)
Potassium: 3.3 mmol/L — ABNORMAL LOW (ref 3.5–5.1)
Sodium: 135 mmol/L (ref 135–145)

## 2019-07-18 LAB — NOVEL CORONAVIRUS, NAA: SARS-CoV-2, NAA: NOT DETECTED

## 2019-07-18 LAB — TROPONIN I (HIGH SENSITIVITY)
Troponin I (High Sensitivity): 13 ng/L (ref <18)
Troponin I (High Sensitivity): 10 ng/L (ref <18)

## 2019-07-18 LAB — RESPIRATORY PANEL BY RT PCR (FLU A&B, COVID)
Influenza A by PCR: NEGATIVE
Influenza B by PCR: NEGATIVE
SARS Coronavirus 2 by RT PCR: NEGATIVE

## 2019-07-18 LAB — SARS-COV-2, NAA 2 DAY TAT

## 2019-07-18 MED ORDER — NITROGLYCERIN 0.4 MG SL SUBL: 0.4000 mg | SUBLINGUAL_TABLET | SUBLINGUAL | Status: DC | PRN

## 2019-07-18 MED ORDER — DESMOPRESSIN ACETATE 0.2 MG PO TABS
0.1000 mg | ORAL_TABLET | Freq: Every day | ORAL | Status: DC
  Administered 2019-07-18: 0.1 mg via ORAL
  Filled 2019-07-18 (×3): qty 1

## 2019-07-18 MED ORDER — ACETAMINOPHEN 325 MG PO TABS
650.0000 mg | ORAL_TABLET | ORAL | Status: DC | PRN
  Administered 2019-07-19: 650 mg via ORAL
  Filled 2019-07-18: qty 2

## 2019-07-18 MED ORDER — APIXABAN 5 MG PO TABS
5.0000 mg | ORAL_TABLET | Freq: Two times a day (BID) | ORAL | Status: DC
  Administered 2019-07-18 – 2019-07-19 (×2): 5 mg via ORAL
  Filled 2019-07-18 (×2): qty 1

## 2019-07-18 MED ORDER — SODIUM CHLORIDE 0.9% FLUSH: 3.0000 mL | Freq: Once | INTRAVENOUS | Status: DC

## 2019-07-18 MED ORDER — TRAZODONE HCL 50 MG PO TABS
150.0000 mg | ORAL_TABLET | Freq: Every day | ORAL | Status: DC
  Administered 2019-07-18: 150 mg via ORAL
  Filled 2019-07-18: qty 3

## 2019-07-18 MED ORDER — HYDRALAZINE HCL 25 MG PO TABS
100.0000 mg | ORAL_TABLET | Freq: Three times a day (TID) | ORAL | Status: DC
  Administered 2019-07-18 – 2019-07-19 (×2): 100 mg via ORAL
  Filled 2019-07-18 (×3): qty 4

## 2019-07-18 MED ORDER — LABETALOL HCL 200 MG PO TABS
200.0000 mg | ORAL_TABLET | Freq: Two times a day (BID) | ORAL | Status: DC
  Administered 2019-07-18 – 2019-07-19 (×2): 200 mg via ORAL
  Filled 2019-07-18 (×2): qty 1

## 2019-07-18 MED ORDER — ATORVASTATIN CALCIUM 10 MG PO TABS
10.0000 mg | ORAL_TABLET | Freq: Every day | ORAL | Status: DC
  Administered 2019-07-18: 10 mg via ORAL
  Filled 2019-07-18 (×2): qty 1

## 2019-07-18 MED ORDER — ONDANSETRON HCL 4 MG/2ML IJ SOLN: 4.0000 mg | Freq: Four times a day (QID) | INTRAMUSCULAR | Status: DC | PRN

## 2019-07-18 MED ORDER — NITROGLYCERIN 0.4 MG SL SUBL
0.4000 mg | SUBLINGUAL_TABLET | Freq: Once | SUBLINGUAL | Status: AC
  Administered 2019-07-18: 0.4 mg via SUBLINGUAL
  Filled 2019-07-18: qty 1

## 2019-07-18 MED ORDER — POTASSIUM CHLORIDE CRYS ER 20 MEQ PO TBCR
40.0000 meq | EXTENDED_RELEASE_TABLET | Freq: Once | ORAL | Status: AC
  Administered 2019-07-18: 40 meq via ORAL
  Filled 2019-07-18: qty 2

## 2019-07-18 MED ORDER — HYDROMORPHONE HCL 2 MG PO TABS
1.0000 mg | ORAL_TABLET | Freq: Two times a day (BID) | ORAL | Status: DC
  Administered 2019-07-18 – 2019-07-19 (×2): 1 mg via ORAL
  Filled 2019-07-18 (×2): qty 1

## 2019-07-18 MED ORDER — PANTOPRAZOLE SODIUM 40 MG PO TBEC
40.0000 mg | DELAYED_RELEASE_TABLET | Freq: Every day | ORAL | Status: DC
  Administered 2019-07-18 – 2019-07-19 (×2): 40 mg via ORAL
  Filled 2019-07-18 (×2): qty 1

## 2019-07-18 MED ORDER — AMLODIPINE BESYLATE 5 MG PO TABS
10.0000 mg | ORAL_TABLET | Freq: Every day | ORAL | Status: DC
  Administered 2019-07-18 – 2019-07-19 (×2): 10 mg via ORAL
  Filled 2019-07-18 (×2): qty 2

## 2019-07-18 NOTE — ED Notes (Signed)
Third dose of nitro given

## 2019-07-18 NOTE — Consult Note (Signed)
Cardiology Consultation:   Patient ID: ROZIE RYKOWSKI MRN: [id number]; DOB: Aug 20, 1950  Admit date: 07/18/2019 Date of Consult: 07/18/2019  Primary Care Provider: Alycia Rossetti, MD Primary Cardiologist: Carlyle Dolly, MD  Primary Electrophysiologist:  None    Patient Profile:   AMIRAH OUTWATER is a 69 y.o. female with a hx of HTN, COPD, aflutter who is being seen today for the evaluation of chest pain at the request of Dr Roderic Palau.  History of Present Illness:   Ms. Bieschke 69 yo female history of COPD, palpitations, difficult to control HTN, aflutter, presents with chest pain.   - Reports 1 month of chest pain symptoms. Mid to right chest pressure, 10/10 at its worst. Can get nauseous, diaphoretic. NO associated SOB. Not positional. Can occur at rest or with activity. Can last up to 6 hours constant. Some improvement with NG today. Was to have an outpatient echo and stress test after recent visith with PA Katina Dung in cardiology clinic.    K 3.3 Cr 1.74 BUN 37 WBC 10.9 Hgb 13.2 Plt 270  hstrop 13-->10 COVID neg EKG sinus brady, no acute ischemic changes CXR no acute process   Past Medical History:  Diagnosis Date  . Anxiety   . Arthritis 12-07-11   arthritis,DDD,spinal stenosis  . Chronic back pain   . COPD (chronic obstructive pulmonary disease) ([place]) 12-07-11   pt. uses nebulizer as needed and inhaler daily  . Fibromyalgia    skin cancer  . GERD (gastroesophageal reflux disease)   . Hemorrhoids   . Hypertension 12-07-11   presently no meds  . Normal cardiac stress test 06/2009  . Shortness of breath 12-07-11   less now, occ wheezes  . Skin cancer    melanoma.  face 2014    Past Surgical History:  Procedure Laterality Date  . ABDOMINAL HYSTERECTOMY    . BACK SURGERY  12-07-11   2'10 L5 x2  . COLONOSCOPY WITH PROPOFOL N/A 06/03/2014   Procedure: COLONOSCOPY WITH PROPOFOL; IN CECUM AT 0750; TOTAL WITHDRAWAL TIME 16 MINUTES;  Surgeon: Rogene Houston, MD;   Location: [facility];  Service: Endoscopy;  Laterality: N/A;  . left ankle surgery     tendonitis  . LUMBAR LAMINECTOMY/DECOMPRESSION MICRODISCECTOMY  12/11/2011   Procedure: LUMBAR LAMINECTOMY/DECOMPRESSION MICRODISCECTOMY;  Surgeon: Tobi Bastos, MD;  Location: [facility];  Service: Orthopedics;  Laterality: Left;  Decompressive Lumbar Laminectomy L5-S1 on Left  . POLYPECTOMY N/A 06/03/2014   Procedure: POLYPECTOMY;  Surgeon: Rogene Houston, MD;  Location: [facility];  Service: Endoscopy;  Laterality: N/A;  . right wrist surgery for pinched nerve    . trigger finger surgery  12-07-11   rt. middle trigger finger release     Inpatient Medications: Scheduled Meds: . sodium chloride flush  3 mL Intravenous Once   Continuous Infusions:  PRN Meds:   Allergies:    Allergies  Allergen Reactions  . Hydrocodone-Acetaminophen   . Oxycodone-Acetaminophen   . Sulfa Antibiotics   . Nystatin Rash    Oral rash  . Percocet [Oxycodone-Acetaminophen] Rash    Oral rash  . Vesicare [Solifenacin] Rash  . Vicodin [Hydrocodone-Acetaminophen] Rash    Oral rash    Social History:   Social History   Socioeconomic History  . Marital status: Legally Separated    Spouse name: Not on file  . Number of children: Not on file  . Years of education: Not on file  . Highest education level: Not on file  Occupational History  .  Not on file  Tobacco Use  . Smoking status: Current Every Day Smoker    Packs/day: 2.00    Years: 48.00    Pack years: 96.00    Types: Cigarettes    Start date: 01/07/1970  . Smokeless tobacco: Never Used  . Tobacco comment: since age 43. smoking 2-3 packs a day  Substance and Sexual Activity  . Alcohol use: No    Alcohol/week: 0.0 standard drinks  . Drug use: No  . Sexual activity: Yes    Birth control/protection: Surgical  Other Topics Concern  . Not on file  Social History Narrative  . Not on file   Social Determinants of Health   Financial Resource Strain:   .  Difficulty of Paying Living Expenses:   Food Insecurity:   . Worried About Charity fundraiser in the Last Year:   . Arboriculturist in the Last Year:   Transportation Needs:   . Film/video editor (Medical):   Marland Kitchen Lack of Transportation (Non-Medical):   Physical Activity:   . Days of Exercise per Week:   . Minutes of Exercise per Session:   Stress:   . Feeling of Stress :   Social Connections:   . Frequency of Communication with Friends and Family:   . Frequency of Social Gatherings with Friends and Family:   . Attends Religious Services:   . Active Member of Clubs or Organizations:   . Attends Archivist Meetings:   Marland Kitchen Marital Status:   Intimate Partner Violence:   . Fear of Current or Ex-Partner:   . Emotionally Abused:   Marland Kitchen Physically Abused:   . Sexually Abused:     Family History:    Family History  Problem Relation Age of Onset  . Cancer Mother        uterine  . Cancer Father        lung     ROS:  Please see the history of present illness.  All other ROS reviewed and negative.     Physical Exam/Data:   Vitals:   07/18/19 1204 07/18/19 1230 07/18/19 1330  BP: (!) 206/51 (!) 192/59 (!) 188/59  Pulse: 60 (!) 49 (!) 57  Resp: (!) 21 17 16   Temp: 98.7 F (37.1 C)    TempSrc: Oral    SpO2: 96% 92% 94%   No intake or output data in the 24 hours ending 07/18/19 1626 Last 3 Weights 07/08/2019 07/01/2019 06/23/2019  Weight (lbs) 218 lb 217 lb 220 lb  Weight (kg) 98.884 kg 98.431 kg 99.791 kg     There is no height or weight on file to calculate BMI.  General:  Well nourished, well developed, in no acute distress HEENT: normal Lymph: no adenopathy Neck: no JVD Endocrine:  No thryomegaly Vascular: No carotid bruits; FA pulses 2+ bilaterally without bruits  Cardiac:  normal S1, S2; RRR; no murmur  Lungs:  clear to auscultation bilaterally, no wheezing, rhonchi or rales  Abd: soft, nontender, no hepatomegaly  Ext: no edema Musculoskeletal:  No  deformities, BUE and BLE strength normal and equal Skin: warm and dry  Neuro:  CNs 2-12 intact, no focal abnormalities noted Psych:  Normal affect    Laboratory Data:  High Sensitivity Troponin:   Recent Labs  Lab 07/18/19 1238 07/18/19 1442  TROPONINIHS 13 10     Chemistry Recent Labs  Lab 07/18/19 1238  NA 135  K 3.3*  CL 99  CO2 24  GLUCOSE 99  BUN 37*  CREATININE 1.74*  CALCIUM 9.2  GFRNONAA 30*  GFRAA 35*  ANIONGAP 12    No results for input(s): PROT, ALBUMIN, AST, ALT, ALKPHOS, BILITOT in the last 168 hours. Hematology Recent Labs  Lab 07/18/19 1238  WBC 10.9*  RBC 4.23  HGB 13.2  HCT 40.6  MCV 96.0  MCH 31.2  MCHC 32.5  RDW 15.4  PLT 270   BNPNo results for input(s): BNP, PROBNP in the last 168 hours.  DDimer No results for input(s): DDIMER in the last 168 hours.   Radiology/Studies:  DG Chest 2 View  Result Date: 07/18/2019 CLINICAL DATA:  Chest pain EXAM: CHEST - 2 VIEW COMPARISON:  April 18, 2019 FINDINGS: There is minimal scarring in the left base. The lungs elsewhere are clear. The heart size and pulmonary vascularity are normal. No adenopathy. There is aortic atherosclerosis. There is calcification in each carotid artery. No bone lesions. IMPRESSION: Minimal scarring left base. Lungs otherwise clear. Heart size normal. Bilateral carotid artery calcification. Aortic Atherosclerosis (ICD10-I70.0). Electronically Signed   By: Lowella Grip III M.D.   On: 07/18/2019 13:15   {  Assessment and Plan:   1. Chest pain - somewhat atypical in the fact can last up to 6 hours. Location is mid to right right, this area is tend to palpation today - cycle ekg and enzymes - obtain inpatient echo - if negative workup and remains pain free would still plan for outpatient stress testing. If abnormal findings or recurrent episodes of chest pain will require inpatient ischemic testing  2. HTN - has been difficult to control. Significant AKI on ACEI, ARB,  and diuretic in the past - low HRs has limited labetalol dosing **note we changed her labetalol to 100mg  bid, please make change on admission meds. Should be off the metoprolol  - if bp's issues during admission would d/c diltiazem, start norvac 10mg  and titrate up her labetalol. I think off dilt would have more room with heart rate for higher labetalol doses which would have more potent bp effects.    3. Paroxysmal aflutter - continue beta blocker,diltiazem, elqiuis   For questions or updates, please contact [place] Please consult [email or web address] for contact info under     Signed, Carlyle Dolly, MD  07/18/2019 4:26 PM

## 2019-07-18 NOTE — Chronic Care Management (AMB) (Signed)
  Chronic Care Management   Note  07/18/2019 Name: Paula Long MRN: [id number] DOB: 1951-02-05  Paula Long is a 69 y.o. year old female who is a primary care patient of [place], Modena Nunnery, MD. I reached out to Karlyne Greenspan by phone today in response to a referral sent by Paula Long's PCP, Buelah Manis, Modena Nunnery, MD.   [name] was given information about Chronic Care Management services today including:  1. CCM service includes personalized support from designated clinical staff supervised by her physician, including individualized plan of care and coordination with other care providers 2. 24/7 contact phone numbers for assistance for urgent and routine care needs. 3. Service will only be billed when office clinical staff spend 20 minutes or more in a month to coordinate care. 4. Only one practitioner may furnish and bill the service in a calendar month. 5. The patient may stop CCM services at any time (effective at the end of the month) by phone call to the office staff.   Patient agreed to services and verbal consent obtained.   Follow up plan:   [place]

## 2019-07-18 NOTE — Care Management Obs Status (Signed)
[facility] NOTIFICATION   Patient Details  Name: ASHTAN PAYNTER MRN: [id number] Date of Birth: 09-04-1950   Medicare Observation Status Notification Given:  Yes    Sherie Don, LCSW 07/18/2019, 9:20 PM

## 2019-07-18 NOTE — ED Notes (Signed)
Second dose of nitroglycerin 0.4mg  SL given

## 2019-07-18 NOTE — ED Provider Notes (Signed)
[facility] EMERGENCY DEPARTMENT Provider Note   CSN: [id number] Arrival date & time: 07/18/19  1152     History Chief Complaint  Patient presents with  . Chest Pain    Paula Long is a 69 y.o. female. History COPD, fibromyalgia, GERD, hypertension, diabetes, CKD, hyperlipidemia, TIA.  Patient presents today for chest pain onset around 8:30 AM this morning, describes severe central chest pressure occasionally rating up to the right jaw, no clear inciting event, no clear aggravating factors. She was sitting on her couch when pain began this morning watching TV. Prior to my initial evaluation patient has received 2 nitro sublingual and a full dose aspirin. Pain moderately improved, now rated as mild-moderate. Associated nausea and diaphoresis.,  Of note patient reports that she is currently being worked up by her cardiologist for chest pain and has a planned stress test and echo on May 5.  Denies fever/chills, fall/injury, vision changes, neck stiffness, sore throat, cough/hemoptysis, abdominal pain, vomiting/diarrhea, numbness/tingling, weakness, swelling/color change of the extremities or any additional concerns. HPI     Past Medical History:  Diagnosis Date  . Anxiety   . Arthritis 12-07-11   arthritis,DDD,spinal stenosis  . Chronic back pain   . COPD (chronic obstructive pulmonary disease) ([place]) 12-07-11   pt. uses nebulizer as needed and inhaler daily  . Fibromyalgia    skin cancer  . GERD (gastroesophageal reflux disease)   . Hemorrhoids   . Hypertension 12-07-11   presently no meds  . Normal cardiac stress test 06/2009  . Shortness of breath 12-07-11   less now, occ wheezes  . Skin cancer    melanoma.  face 2014    Patient Active Problem List   Diagnosis Date Noted  . Chest pain 07/18/2019  . Leg edema 03/25/2019  . Palpitations 03/25/2019  . Memory changes 12/13/2018  . B12 deficiency 12/13/2018  . Carotid artery disease ([place]) 10/08/2018  . Vision changes    . Weakness   . Diabetes mellitus with nephropathy ([place])   . CKD stage 3 secondary to diabetes ([place])   . Hyperlipidemia   . TIA (transient ischemic attack) 09/29/2018  . Peripheral edema 09/04/2018  . Depression with anxiety 05/22/2014  . Family hx of colon cancer 02/19/2014  . IBS (irritable bowel syndrome) 01/23/2014  . Urinary incontinence, mixed 10/13/2013  . Chronic insomnia 09/12/2013  . Boil 02/27/2013  . Skin lesion of face 10/22/2012  . Hemorrhoids 10/22/2012  . Intertrigo 06/21/2012  . Neck pain on left side 06/21/2012  . Bunion of great toe 06/21/2012  . OAB (overactive bladder) 03/15/2012  . Spinal stenosis, lumbar region, with neurogenic claudication 12/11/2011  . Essential hypertension 10/30/2011  . Chronic back pain 10/30/2011  . GERD (gastroesophageal reflux disease) 10/30/2011  . COPD (chronic obstructive pulmonary disease) ([place]) 10/30/2011  . Tobacco user 10/30/2011  . Obesity 10/30/2011  . Abnormal mammogram 10/30/2011    Past Surgical History:  Procedure Laterality Date  . ABDOMINAL HYSTERECTOMY    . BACK SURGERY  12-07-11   2'10 L5 x2  . COLONOSCOPY WITH PROPOFOL N/A 06/03/2014   Procedure: COLONOSCOPY WITH PROPOFOL; IN CECUM AT 0750; TOTAL WITHDRAWAL TIME 16 MINUTES;  Surgeon: Rogene Houston, MD;  Location: [facility];  Service: Endoscopy;  Laterality: N/A;  . left ankle surgery     tendonitis  . LUMBAR LAMINECTOMY/DECOMPRESSION MICRODISCECTOMY  12/11/2011   Procedure: LUMBAR LAMINECTOMY/DECOMPRESSION MICRODISCECTOMY;  Surgeon: Tobi Bastos, MD;  Location: [facility];  Service: Orthopedics;  Laterality: Left;  Decompressive Lumbar Laminectomy L5-S1 on Left  . POLYPECTOMY N/A 06/03/2014   Procedure: POLYPECTOMY;  Surgeon: Rogene Houston, MD;  Location: [facility];  Service: Endoscopy;  Laterality: N/A;  . right wrist surgery for pinched nerve    . trigger finger surgery  12-07-11   rt. middle trigger finger release     OB History    Gravida  3   Para  2    Term  2   Preterm      AB  1   Living        SAB  1   TAB      Ectopic      Multiple      Live Births              Family History  Problem Relation Age of Onset  . Cancer Mother        uterine  . Cancer Father        lung    Social History   Tobacco Use  . Smoking status: Current Every Day Smoker    Packs/day: 2.00    Years: 48.00    Pack years: 96.00    Types: Cigarettes    Start date: 01/07/1970  . Smokeless tobacco: Never Used  . Tobacco comment: since age 31. smoking 2-3 packs a day  Substance Use Topics  . Alcohol use: No    Alcohol/week: 0.0 standard drinks  . Drug use: No    Home Medications Prior to Admission medications   Medication Sig Start Date End Date Taking? Authorizing Provider  albuterol (VENTOLIN HFA) 108 (90 Base) MCG/ACT inhaler INHALE 2 PUFFS EVERY 4 HOURS AS NEEDED FOR WHEEZING OR SHORTNESS OF BREATH 05/21/19  Yes [place], Modena Nunnery, MD  atorvastatin (LIPITOR) 10 MG tablet TAKE 1 TABLET BY MOUTH ONCE DAILY AT 6:00 PM. 05/01/19  Yes [place], Modena Nunnery, MD  desmopressin (DDAVP) 0.1 MG tablet TAKE 1 TABLET AT BEDTIME FOR BLADDER Patient taking differently: Take 0.1 mg by mouth at bedtime. For bladder 11/13/18  Yes [place], Modena Nunnery, MD  dexlansoprazole (DEXILANT) 60 MG capsule Take 1 capsule (60 mg total) by mouth daily. Patient taking differently: Take 1 capsule by mouth daily as needed.  05/01/19  Yes [place], Modena Nunnery, MD  diltiazem (CARDIZEM) 60 MG tablet Take 60 mg by mouth in the morning and at bedtime.   Yes [provider]  doxycycline (VIBRA-TABS) 100 MG tablet Take 1 tablet (100 mg total) by mouth 2 (two) times daily. 07/16/19  Yes [place], Modena Nunnery, MD  furosemide (LASIX) 40 MG tablet Take 60 mg by mouth daily. Patient takes 1 and 1/2 tablet by mouth once daily. (60mg )   Yes [provider]  HYDROmorphone (DILAUDID) 2 MG tablet Take 1 mg by mouth 2 (two) times daily.    Yes [provider]  IBU 600 MG  tablet TAKE 1 TABLET TWICE DAILY AS NEEDED 06/23/19  Yes [place], Modena Nunnery, MD  labetalol (NORMODYNE) 200 MG tablet Take 1 tablet (200 mg total) by mouth 2 (two) times daily. 07/03/19  Yes [place], Alphonse Guild, MD  metoprolol tartrate (LOPRESSOR) 25 MG tablet Take 1 tablet by mouth 2 (two) times daily.   Yes [provider]  nitroGLYCERIN (NITROSTAT) 0.4 MG SL tablet Place 1 tablet (0.4 mg total) under the tongue every 5 (five) minutes as needed for chest pain. 07/08/19 10/06/19 Yes Verta Ellen., NP  nystatin (MYCOSTATIN/NYSTOP) powder APPLY  TOPICALLY TWICE DAILY AS  NEEDED Patient taking differently: Apply 1 Bottle topically 2 (two) times daily as needed (irritation). APPLY  TOPICALLY TWICE DAILY AS NEEDED 07/29/18  Yes [place], Modena Nunnery, MD  predniSONE (DELTASONE) 20 MG tablet Take 2 tablets (40 mg total) by mouth daily with breakfast. For 5 days Patient taking differently: Take 20 mg by mouth daily with breakfast. Prescribed 07/16/19 supposed to take for 7 days. 07/16/19  Yes [place], Modena Nunnery, MD  traZODone (DESYREL) 150 MG tablet TAKE 1 TABLET AT BEDTIME 06/23/19  Yes [place], Modena Nunnery, MD  albuterol (PROVENTIL) (2.5 MG/3ML) 0.083% nebulizer solution INHALE THE CONTENTS OF 1 VIAL VIA NEBULIZER EVERY 4 HOURS AS NEEDED FOR WHEEZING OR SHORTNESS OF BREATH Patient not taking: No sig reported 09/18/16   Alycia Rossetti, MD  apixaban (ELIQUIS) 5 MG TABS tablet Take 1 tablet (5 mg total) by mouth 2 (two) times daily. Patient not taking: Reported on 07/18/2019 07/01/19   Verta Ellen., NP  Clobetasol Prop Emollient Base 0.05 % emollient cream Apply to affected areas BID Patient not taking: Reported on 07/18/2019 01/31/19   Alycia Rossetti, MD  dextromethorphan-guaiFENesin [facility] DM) 30-600 MG 12hr tablet Take 1 tablet by mouth 2 (two) times daily. Patient not taking: Reported on 07/18/2019 07/16/19   Alycia Rossetti, MD  DULoxetine (CYMBALTA) 30 MG capsule Take 1 capsule (30 mg total) by  mouth daily. Patient not taking: Reported on 07/18/2019 05/01/19   Alycia Rossetti, MD  [facility] TEST test strip USE TO TEST ONCE DAILY. Patient not taking: USE TO TEST ONCE DAILY 06/03/15   Susy Frizzle, MD  gabapentin (NEURONTIN) 400 MG capsule Take 1 capsule (400 mg total) by mouth at bedtime. Patient not taking: Reported on 07/18/2019 04/18/19   Alycia Rossetti, MD  hydrALAZINE (APRESOLINE) 50 MG tablet Take 2 tablets (100 mg total) by mouth 3 (three) times daily. 06/30/19   Arnoldo Lenis, MD  triamcinolone cream (KENALOG) 0.1 % Apply 1 application topically 2 (two) times daily. Patient not taking: Reported on 07/18/2019 05/20/19   Alycia Rossetti, MD    Allergies    Hydrocodone-acetaminophen, Oxycodone-acetaminophen, Sulfa antibiotics, Nystatin, Percocet [oxycodone-acetaminophen], Vesicare [solifenacin], and Vicodin [hydrocodone-acetaminophen]  Review of Systems   Review of Systems Ten systems are reviewed and are negative for acute change except as noted in the HPI  Physical Exam Updated Vital Signs BP (!) 188/59   Pulse (!) 57   Temp 98.7 F (37.1 C) (Oral)   Resp 16   SpO2 94%   Physical Exam Constitutional:      General: She is not in acute distress.    Appearance: Normal appearance. She is well-developed. She is obese. She is not ill-appearing or diaphoretic.  HENT:     Head: Normocephalic and atraumatic.     Right Ear: External ear normal.     Left Ear: External ear normal.     Nose: Nose normal.  Eyes:     General: Vision grossly intact. Gaze aligned appropriately.     Pupils: Pupils are equal, round, and reactive to light.  Neck:     Trachea: Trachea and phonation normal. No tracheal deviation.  Cardiovascular:     Rate and Rhythm: Normal rate and regular rhythm.     Pulses:          Radial pulses are 2+ on the right side and 2+ on the left side.       Dorsalis pedis pulses are 2+ on the right side and  2+ on the left side.     Heart sounds: Normal heart  sounds.  Pulmonary:     Effort: Pulmonary effort is normal. No respiratory distress.     Breath sounds: Normal breath sounds.  Abdominal:     General: There is no distension.     Palpations: Abdomen is soft.     Tenderness: There is no abdominal tenderness. There is no guarding or rebound.  Musculoskeletal:        General: Normal range of motion.     Cervical back: Normal range of motion.     Right lower leg: No tenderness. No edema.     Left lower leg: No tenderness. No edema.  Skin:    General: Skin is warm and dry.     Capillary Refill: Capillary refill takes less than 2 seconds.  Neurological:     General: No focal deficit present.     Mental Status: She is alert.     GCS: GCS eye subscore is 4. GCS verbal subscore is 5. GCS motor subscore is 6.     Comments: Speech is clear and goal oriented, follows commands Major Cranial nerves without deficit, no facial droop Normal strength in upper and lower extremities bilaterally including dorsiflexion and plantar flexion, strong and equal grip strength Sensation normal to light and sharp touch Moves extremities without ataxia, coordination intact  Psychiatric:        Behavior: Behavior normal.     ED Results / Procedures / Treatments   Labs (all labs ordered are listed, but only abnormal results are displayed) Labs Reviewed  BASIC METABOLIC PANEL - Abnormal; Notable for the following components:      Result Value   Potassium 3.3 (*)    BUN 37 (*)    Creatinine, Ser 1.74 (*)    GFR calc non Af Amer 30 (*)    GFR calc Af Amer 35 (*)    All other components within normal limits  CBC - Abnormal; Notable for the following components:   WBC 10.9 (*)    All other components within normal limits  RESPIRATORY PANEL BY RT PCR (FLU A&B, COVID)  TROPONIN I (HIGH SENSITIVITY)  TROPONIN I (HIGH SENSITIVITY)    EKG EKG Interpretation  Date/Time:  Friday July 18 2019 12:01:05 EDT Ventricular Rate:  55 PR Interval:    QRS  Duration: 105 QT Interval:  415 QTC Calculation: 397 R Axis:   43 Text Interpretation: Sinus rhythm RSR' in V1 or V2, right VCD or RVH No significant change since prior 1/21 Confirmed by Aletta Edouard [phone]) on 07/18/2019 12:12:18 PM   Radiology DG Chest 2 View  Result Date: 07/18/2019 CLINICAL DATA:  Chest pain EXAM: CHEST - 2 VIEW COMPARISON:  April 18, 2019 FINDINGS: There is minimal scarring in the left base. The lungs elsewhere are clear. The heart size and pulmonary vascularity are normal. No adenopathy. There is aortic atherosclerosis. There is calcification in each carotid artery. No bone lesions. IMPRESSION: Minimal scarring left base. Lungs otherwise clear. Heart size normal. Bilateral carotid artery calcification. Aortic Atherosclerosis (ICD10-I70.0). Electronically Signed   By: Lowella Grip III M.D.   On: 07/18/2019 13:15    Procedures Procedures (including critical care time)  Medications Ordered in ED Medications  sodium chloride flush (NS) 0.9 % injection 3 mL (has no administration in time range)  nitroGLYCERIN (NITROSTAT) SL tablet 0.4 mg (0.4 mg Sublingual Given 07/18/19 1350)    ED Course  I have reviewed the triage vital  signs and the nursing notes.  Pertinent labs & imaging results that were available during my care of the patient were reviewed by me and considered in my medical decision making (see chart for details).  Clinical Course as of Jul 17 1501  Fri Jul 18, 5367  1166 69 year old female complaining of substernal chest pain that started this morning.  She was diaphoretic and lightheaded.  She says her pain is improved but not resolved.  Blood pressure elevated here.  Elevated creatinine baseline for patient.  First troponin XIII.  EKG did not show any obvious STEMI.  Will need a delta troponin and get some recommendations from cardiology as she has an ongoing work-up with them   [MB]  [address] Dr Harl Bowie   [BM]  [place] Dr. Roderic Palau   [BM]    Clinical  Course User Index [BM] Deliah Boston, PA-C [MB] Hayden Rasmussen, MD   MDM Rules/Calculators/A&P                     Chart review: Patient had cardiology office visit on 07/08/2019. At that time she had a stress test and echocardiogram ordered. Assessment and plan includes hypertension, she was started on hydralazine. They are continuing labetalol 200 mg p.o. twice daily, Cardizem 120 mg p.o. twice daily and hydralazine 100 mg p.o. 3 times daily. Patient also being monitored for syncope. Atrial flutter is being treated with Eliquis 5 mg p.o. twice daily, they planned that if subsequent EKG showed no recurrence of atrial flutter they might stop Eliquis. Patient has multiple vascular stenoses listed in their assessment and plan which are being managed medically and with lifestyle changes. Her chest pain and dyspnea on exertion they plan for evaluation via echocardiogram and Lexiscan Myoview stress test. - On initial evaluation patient is stable, hypertensive, no acute distress.  Reports lingering pain, will give third nitro and obtain chest pain work-up.  CBC, BMP, troponin, chest x-ray and EKG were ordered.  She has no associated shortness of breath, no recent infectious symptoms and no cough/hemoptysis, is no evidence of DVT on exam.  Suspicion is not today for ACS as etiology of her symptoms today, symptoms appear atypical for dissection.  Additionally as patient is on Eliquis doubt pulmonary embolism as etiology of her symptoms today. - EKG: Sinus rhythm RSR' in V1 or V2, right VCD or RVH No significant change since prior 1/21 Confirmed by Aletta Edouard [phone]) on 07/18/2019 12:12:18 PM  CXR:  IMPRESSION:  Minimal scarring left base. Lungs otherwise clear. Heart size  normal. Bilateral carotid artery calcification. Aortic  Atherosclerosis (ICD10-I70.0).   I have reviewed and interpreted the following labs.  CBC shows nonspecific leukocytosis of 10.9 suspect secondary to pain today, doubt  infection.  Additionally no evidence of anemia.  High-sensitivity troponin within normal limits, 13, will need delta troponin.  BMP shows slightly elevated creatinine at 1.74, baseline appears around 1.5, BUN elevated at 37 likely secondary to dehydration at this time. - 2:17 PM: Discussed case with cardiologist Dr. Harl Bowie, he advises admission to hospitalist at this time, cardiology team will see patient in hospital. - Patient reassessed resting comfortably no acute distress reports improvement of pain following third nitro today, states understanding of care plan and is agreeable for admission.  Screening Covid test added. - 2:45 PM: Discussed case with Dr. Roderic Palau who has accepted patient to hospitalist service.  Patient was seen and evaluated by Dr. Melina Copa during this visit.  Note: Portions of this report may  have been transcribed using voice recognition software. Every effort was made to ensure accuracy; however, inadvertent computerized transcription errors may still be present. Final Clinical Impression(s) / ED Diagnoses Final diagnoses:  Chest pain, unspecified type    Rx / DC Orders ED Discharge Orders    None       Gari Crown 07/18/19 1504    Hayden Rasmussen, MD 07/18/19 [phone]

## 2019-07-18 NOTE — H&P (Signed)
History and Physical    Paula Long [id number] DOB: 05-19-1950 DOA: 07/18/2019  PCP: Alycia Rossetti, MD  Patient coming from: Home  I have personally briefly reviewed patient's old medical records in [place]  Chief Complaint: Chest discomfort  HPI: Paula Long is a 69 y.o. female with medical history significant of hypertension, COPD, atrial flutter, tobacco use, presents to the emergency room with complaints of chest pain.  Reports symptoms has been present for the past month.  She describes mild right-sided chest pressure.  She has associated nausea and diaphoresis.  No associated shortness of breath.  Can last up to 6 hours at a time.  She did feel some improvement with nitroglycerin today.  EKG did not show any acute changes.  Cardiac enzymes were negative.  She was noted to be markedly hypertensive on arrival with a systolic blood pressure over 200.  She reports being compliant with her medications but her blood pressures usually run between 170-190 at home.  Patient was seen by cardiology in the emergency room with recommendations for overnight observation.  Review of Systems: As per HPI otherwise 10 point review of systems negative.    Past Medical History:  Diagnosis Date  . Anxiety   . Arthritis 12-07-11   arthritis,DDD,spinal stenosis  . Chronic back pain   . COPD (chronic obstructive pulmonary disease) ([place]) 12-07-11   pt. uses nebulizer as needed and inhaler daily  . Fibromyalgia    skin cancer  . GERD (gastroesophageal reflux disease)   . Hemorrhoids   . Hypertension 12-07-11   presently no meds  . Normal cardiac stress test 06/2009  . Shortness of breath 12-07-11   less now, occ wheezes  . Skin cancer    melanoma.  face 2014    Past Surgical History:  Procedure Laterality Date  . ABDOMINAL HYSTERECTOMY    . BACK SURGERY  12-07-11   2'10 L5 x2  . COLONOSCOPY WITH PROPOFOL N/A 06/03/2014   Procedure: COLONOSCOPY WITH PROPOFOL; IN CECUM AT 0750;  TOTAL WITHDRAWAL TIME 16 MINUTES;  Surgeon: Rogene Houston, MD;  Location: [facility];  Service: Endoscopy;  Laterality: N/A;  . left ankle surgery     tendonitis  . LUMBAR LAMINECTOMY/DECOMPRESSION MICRODISCECTOMY  12/11/2011   Procedure: LUMBAR LAMINECTOMY/DECOMPRESSION MICRODISCECTOMY;  Surgeon: Tobi Bastos, MD;  Location: [facility];  Service: Orthopedics;  Laterality: Left;  Decompressive Lumbar Laminectomy L5-S1 on Left  . POLYPECTOMY N/A 06/03/2014   Procedure: POLYPECTOMY;  Surgeon: Rogene Houston, MD;  Location: [facility];  Service: Endoscopy;  Laterality: N/A;  . right wrist surgery for pinched nerve    . trigger finger surgery  12-07-11   rt. middle trigger finger release    Social History:  reports that she has been smoking cigarettes. She started smoking about 49 years ago. She has a 96.00 pack-year smoking history. She has never used smokeless tobacco. She reports that she does not drink alcohol or use drugs.  Allergies  Allergen Reactions  . Hydrocodone-Acetaminophen   . Oxycodone-Acetaminophen   . Sulfa Antibiotics   . Nystatin Rash    Oral rash  . Percocet [Oxycodone-Acetaminophen] Rash    Oral rash  . Vesicare [Solifenacin] Rash  . Vicodin [Hydrocodone-Acetaminophen] Rash    Oral rash    Family History  Problem Relation Age of Onset  . Cancer Mother        uterine  . Cancer Father        lung  Prior to Admission medications   Medication Sig Start Date End Date Taking? Authorizing Provider  albuterol (VENTOLIN HFA) 108 (90 Base) MCG/ACT inhaler INHALE 2 PUFFS EVERY 4 HOURS AS NEEDED FOR WHEEZING OR SHORTNESS OF BREATH 05/21/19  Yes [place], Modena Nunnery, MD  atorvastatin (LIPITOR) 10 MG tablet TAKE 1 TABLET BY MOUTH ONCE DAILY AT 6:00 PM. 05/01/19  Yes [place], Modena Nunnery, MD  desmopressin (DDAVP) 0.1 MG tablet TAKE 1 TABLET AT BEDTIME FOR BLADDER Patient taking differently: Take 0.1 mg by mouth at bedtime. For bladder 11/13/18  Yes [place], Modena Nunnery, MD   dexlansoprazole (DEXILANT) 60 MG capsule Take 1 capsule (60 mg total) by mouth daily. Patient taking differently: Take 1 capsule by mouth daily as needed.  05/01/19  Yes [place], Modena Nunnery, MD  diltiazem (CARDIZEM) 60 MG tablet Take 60 mg by mouth in the morning and at bedtime.   Yes [provider]  doxycycline (VIBRA-TABS) 100 MG tablet Take 1 tablet (100 mg total) by mouth 2 (two) times daily. 07/16/19  Yes [place], Modena Nunnery, MD  furosemide (LASIX) 40 MG tablet Take 60 mg by mouth daily. Patient takes 1 and 1/2 tablet by mouth once daily. (60mg )   Yes [provider]  HYDROmorphone (DILAUDID) 2 MG tablet Take 1 mg by mouth 2 (two) times daily.    Yes [provider]  labetalol (NORMODYNE) 200 MG tablet Take 1 tablet (200 mg total) by mouth 2 (two) times daily. 07/03/19  Yes [place], Alphonse Guild, MD  nitroGLYCERIN (NITROSTAT) 0.4 MG SL tablet Place 1 tablet (0.4 mg total) under the tongue every 5 (five) minutes as needed for chest pain. 07/08/19 10/06/19 Yes Verta Ellen., NP  nystatin (MYCOSTATIN/NYSTOP) powder APPLY  TOPICALLY TWICE DAILY AS NEEDED Patient taking differently: Apply 1 Bottle topically 2 (two) times daily as needed (irritation). APPLY  TOPICALLY TWICE DAILY AS NEEDED 07/29/18  Yes [place], Modena Nunnery, MD  predniSONE (DELTASONE) 20 MG tablet Take 2 tablets (40 mg total) by mouth daily with breakfast. For 5 days Patient taking differently: Take 20 mg by mouth daily with breakfast. Prescribed 07/16/19 supposed to take for 7 days. 07/16/19  Yes [place], Modena Nunnery, MD  traZODone (DESYREL) 150 MG tablet TAKE 1 TABLET AT BEDTIME 06/23/19  Yes [place], Modena Nunnery, MD  albuterol (PROVENTIL) (2.5 MG/3ML) 0.083% nebulizer solution INHALE THE CONTENTS OF 1 VIAL VIA NEBULIZER EVERY 4 HOURS AS NEEDED FOR WHEEZING OR SHORTNESS OF BREATH Patient not taking: No sig reported 09/18/16   Alycia Rossetti, MD  apixaban (ELIQUIS) 5 MG TABS tablet Take 1 tablet (5 mg total) by mouth 2  (two) times daily. Patient not taking: Reported on 07/18/2019 07/01/19   Verta Ellen., NP  Clobetasol Prop Emollient Base 0.05 % emollient cream Apply to affected areas BID Patient not taking: Reported on 07/18/2019 01/31/19   Alycia Rossetti, MD  dextromethorphan-guaiFENesin [facility] DM) 30-600 MG 12hr tablet Take 1 tablet by mouth 2 (two) times daily. Patient not taking: Reported on 07/18/2019 07/16/19   Alycia Rossetti, MD  DULoxetine (CYMBALTA) 30 MG capsule Take 1 capsule (30 mg total) by mouth daily. Patient not taking: Reported on 07/18/2019 05/01/19   Alycia Rossetti, MD  [facility] TEST test strip USE TO TEST ONCE DAILY. Patient not taking: USE TO TEST ONCE DAILY 06/03/15   Susy Frizzle, MD  gabapentin (NEURONTIN) 400 MG capsule Take 1 capsule (400 mg total) by mouth at bedtime. Patient not taking: Reported on  07/18/2019 04/18/19   Alycia Rossetti, MD  hydrALAZINE (APRESOLINE) 50 MG tablet Take 2 tablets (100 mg total) by mouth 3 (three) times daily. 06/30/19   Arnoldo Lenis, MD  triamcinolone cream (KENALOG) 0.1 % Apply 1 application topically 2 (two) times daily. Patient not taking: Reported on 07/18/2019 05/20/19   Alycia Rossetti, MD    Physical Exam: Vitals:   07/18/19 1600 07/18/19 1630 07/18/19 1700 07/18/19 1830  BP: (!) 154/61 (!) 137/44 (!) 150/53 (!) 167/62  Pulse: (!) 54 (!) 56 (!) 52 (!) 58  Resp: 16 17 17 20   Temp:      TempSrc:      SpO2: 91% 91% 91% 92%    Constitutional: NAD, calm, comfortable Eyes: PERRL, lids and conjunctivae normal ENMT: Mucous membranes are moist. Posterior pharynx clear of any exudate or lesions.Normal dentition.  Neck: normal, supple, no masses, no thyromegaly Respiratory: clear to auscultation bilaterally, no wheezing, no crackles. Normal respiratory effort. No accessory muscle use.  Cardiovascular: Regular rate and rhythm, no murmurs / rubs / gallops. No extremity edema. 2+ pedal pulses. No carotid bruits.  Abdomen: no  tenderness, no masses palpated. No hepatosplenomegaly. Bowel sounds positive.  Musculoskeletal: no clubbing / cyanosis. No joint deformity upper and lower extremities. Good ROM, no contractures. Normal muscle tone.  Skin: no rashes, lesions, ulcers. No induration Neurologic: CN 2-12 grossly intact. Sensation intact, DTR normal. Strength 5/5 in all 4.  Psychiatric: Normal judgment and insight. Alert and oriented x 3. Normal mood.    Labs on Admission: I have personally reviewed following labs and imaging studies  CBC: Recent Labs  Lab 07/18/19 1238  WBC 10.9*  HGB 13.2  HCT 40.6  MCV 96.0  PLT [id number]   Basic Metabolic Panel: Recent Labs  Lab 07/18/19 1238  NA 135  K 3.3*  CL 99  CO2 24  GLUCOSE 99  BUN 37*  CREATININE 1.74*  CALCIUM 9.2   GFR: Estimated Creatinine Clearance: 36.6 mL/min (A) (by C-G formula based on SCr of 1.74 mg/dL (H)). Liver Function Tests: No results for input(s): AST, ALT, ALKPHOS, BILITOT, PROT, ALBUMIN in the last 168 hours. No results for input(s): LIPASE, AMYLASE in the last 168 hours. No results for input(s): AMMONIA in the last 168 hours. Coagulation Profile: No results for input(s): INR, PROTIME in the last 168 hours. Cardiac Enzymes: No results for input(s): CKTOTAL, CKMB, CKMBINDEX, TROPONINI in the last 168 hours. BNP (last 3 results) No results for input(s): PROBNP in the last 8760 hours. HbA1C: No results for input(s): HGBA1C in the last 72 hours. CBG: No results for input(s): GLUCAP in the last 168 hours. Lipid Profile: No results for input(s): CHOL, HDL, LDLCALC, TRIG, CHOLHDL, LDLDIRECT in the last 72 hours. Thyroid Function Tests: No results for input(s): TSH, T4TOTAL, FREET4, T3FREE, THYROIDAB in the last 72 hours. Anemia Panel: No results for input(s): VITAMINB12, FOLATE, FERRITIN, TIBC, IRON, RETICCTPCT in the last 72 hours. Urine analysis:    Component Value Date/Time   COLORURINE YELLOW 09/29/2018 1252   APPEARANCEUR  CLEAR 09/29/2018 1252   LABSPEC 1.015 09/29/2018 1252   PHURINE 5.0 09/29/2018 1252   GLUCOSEU 150 (A) 09/29/2018 1252   HGBUR NEGATIVE 09/29/2018 1252   BILIRUBINUR NEGATIVE 09/29/2018 1252   BILIRUBINUR neg 03/11/2012 1538   KETONESUR NEGATIVE 09/29/2018 1252   PROTEINUR NEGATIVE 09/29/2018 1252   UROBILINOGEN 0.2 10/13/2013 1517   NITRITE NEGATIVE 09/29/2018 1252   LEUKOCYTESUR NEGATIVE 09/29/2018 1252    Radiological Exams on  Admission: DG Chest 2 View  Result Date: 07/18/2019 CLINICAL DATA:  Chest pain EXAM: CHEST - 2 VIEW COMPARISON:  April 18, 2019 FINDINGS: There is minimal scarring in the left base. The lungs elsewhere are clear. The heart size and pulmonary vascularity are normal. No adenopathy. There is aortic atherosclerosis. There is calcification in each carotid artery. No bone lesions. IMPRESSION: Minimal scarring left base. Lungs otherwise clear. Heart size normal. Bilateral carotid artery calcification. Aortic Atherosclerosis (ICD10-I70.0). Electronically Signed   By: Lowella Grip III M.D.   On: 07/18/2019 13:15    EKG: Independently reviewed.  Sinus bradycardia without acute ischemic changes  Assessment/Plan Active Problems:   Chronic back pain   GERD (gastroesophageal reflux disease)   COPD (chronic obstructive pulmonary disease) ([facility])   Tobacco user   Chest pain   Hypertensive urgency   Paroxysmal atrial flutter ([place])     1. Chest pain.  Appears to be atypical.  Possibly related to elevated blood pressure.  Cardiac enzymes thus far negative.  She is not having any further chest discomfort at this time.  Seen by cardiology with recommendations for overnight observation.  Check echocardiogram.  If she rules out for ACS and has no further symptoms, will likely pursue outpatient stress test. 2. Hypertensive urgency.  Difficult to control blood pressure.  Will discontinue diltiazem in favor of Norvasc.  This will likely allow further titration of labetalol if  needed.  Continue on hydralazine. 3. Paroxysmal atrial flutter.  Continue on beta-blockers and Eliquis. 4. GERD.  Continue on PPI 5. COPD.  No shortness of breath or wheezing at this time.  Continue bronchodilators as needed. 6. Chronic back pain.  Continue home dose of Dilaudid.  DVT prophylaxis: Apixaban Code Status: Full code Family Communication: Discussed with husband at the bedside Disposition Plan: Discharge home in a.m. if no further symptoms Consults called: Cardiology Admission status: Observation, telemetry  Kathie Dike MD [facility]   If 7PM-7AM, please contact night-coverage [email or web address]   07/18/2019, 7:26 PM

## 2019-07-18 NOTE — ED Notes (Signed)
Meal tray given 

## 2019-07-18 NOTE — ED Triage Notes (Signed)
Pt brought in by [facility] for c/o chest pain that started this am; pt administered 2 SL nitro and 2 baby asa prior to ems arrival; when ems arrived they administered 2 more baby asa; pt told ems the nitro helped relieve her cp when she administered it.  Pt states she felt nauseous, was diaphoretic and felt like she was going to pass out when the episode occurred; pt states she was watching TV when the pain first started

## 2019-07-19 ENCOUNTER — Observation Stay (HOSPITAL_BASED_OUTPATIENT_CLINIC_OR_DEPARTMENT_OTHER): Payer: Medicare HMO

## 2019-07-19 DIAGNOSIS — I160 Hypertensive urgency: Secondary | ICD-10-CM | POA: Diagnosis not present

## 2019-07-19 DIAGNOSIS — M545 Low back pain: Secondary | ICD-10-CM | POA: Diagnosis not present

## 2019-07-19 DIAGNOSIS — R079 Chest pain, unspecified: Secondary | ICD-10-CM

## 2019-07-19 DIAGNOSIS — G8929 Other chronic pain: Secondary | ICD-10-CM | POA: Diagnosis not present

## 2019-07-19 DIAGNOSIS — K219 Gastro-esophageal reflux disease without esophagitis: Secondary | ICD-10-CM | POA: Diagnosis not present

## 2019-07-19 DIAGNOSIS — I4892 Unspecified atrial flutter: Secondary | ICD-10-CM | POA: Diagnosis not present

## 2019-07-19 DIAGNOSIS — J418 Mixed simple and mucopurulent chronic bronchitis: Secondary | ICD-10-CM | POA: Diagnosis not present

## 2019-07-19 LAB — ECHOCARDIOGRAM COMPLETE
Height: 65 in
Weight: 3385.6 oz

## 2019-07-19 LAB — TROPONIN I (HIGH SENSITIVITY)
Troponin I (High Sensitivity): 12 ng/L (ref <18)
Troponin I (High Sensitivity): 10 ng/L (ref <18)

## 2019-07-19 MED ORDER — LABETALOL HCL 200 MG PO TABS: 100.0000 mg | ORAL_TABLET | Freq: Two times a day (BID) | ORAL | Status: DC

## 2019-07-19 MED ORDER — NICOTINE 21 MG/24HR TD PT24
21.0000 mg | MEDICATED_PATCH | TRANSDERMAL | 1 refills | Status: DC
Start: 2019-07-19 — End: 2019-08-07

## 2019-07-19 MED ORDER — AMLODIPINE BESYLATE 10 MG PO TABS: 10.0000 mg | ORAL_TABLET | Freq: Every day | ORAL | 0 refills | Status: DC

## 2019-07-19 NOTE — Progress Notes (Signed)
Nsg Discharge Note  Admit Date:  07/18/2019 Discharge date: 07/19/2019   ALEXISMARIE HUNNEL to be D/C'd Home per MD order.  AVS completed.  Copy for chart, and copy for patient signed, and dated. Removed IV-clean, dry, intact. Reviewed d/c paperwork with patient. Answered all questions. Wheeled stable patient and belongings to main entrance where she was picked up by her friend to d/c to home. Patient/caregiver able to verbalize understanding.  Discharge Medication: Allergies as of 07/19/2019      Reactions   Hydrocodone-acetaminophen    Oxycodone-acetaminophen    Sulfa Antibiotics    Nystatin Rash   Oral rash   Percocet [oxycodone-acetaminophen] Rash   Oral rash   Vesicare [solifenacin] Rash   Vicodin [hydrocodone-acetaminophen] Rash   Oral rash      Medication List    STOP taking these medications   diltiazem 60 MG tablet Commonly known as: CARDIZEM   doxycycline 100 MG tablet Commonly known as: VIBRA-TABS   DULoxetine 30 MG capsule Commonly known as: Cymbalta   predniSONE 20 MG tablet Commonly known as: DELTASONE     TAKE these medications   albuterol (2.5 MG/3ML) 0.083% nebulizer solution Commonly known as: PROVENTIL INHALE THE CONTENTS OF 1 VIAL VIA NEBULIZER EVERY 4 HOURS AS NEEDED FOR WHEEZING OR SHORTNESS OF BREATH   albuterol 108 (90 Base) MCG/ACT inhaler Commonly known as: VENTOLIN HFA INHALE 2 PUFFS EVERY 4 HOURS AS NEEDED FOR WHEEZING OR SHORTNESS OF BREATH   amLODipine 10 MG tablet Commonly known as: NORVASC Take 1 tablet (10 mg total) by mouth daily. Start taking on: Jul 20, 2019   apixaban 5 MG Tabs tablet Commonly known as: Eliquis Take 1 tablet (5 mg total) by mouth 2 (two) times daily.   atorvastatin 10 MG tablet Commonly known as: LIPITOR TAKE 1 TABLET BY MOUTH ONCE DAILY AT 6:00 PM.   Clobetasol Prop Emollient Base 0.05 % emollient cream Apply to affected areas BID   desmopressin 0.1 MG tablet Commonly known as: DDAVP TAKE 1 TABLET AT  BEDTIME FOR BLADDER What changed: See the new instructions.   Dexilant 60 MG capsule Generic drug: dexlansoprazole Take 1 capsule (60 mg total) by mouth daily. What changed:   how much to take  when to take this  reasons to take this   dextromethorphan-guaiFENesin 30-600 MG 12hr tablet Commonly known as: MUCINEX DM Take 1 tablet by mouth 2 (two) times daily.   EasyMax Test test strip Generic drug: glucose blood USE TO TEST ONCE DAILY.   furosemide 40 MG tablet Commonly known as: LASIX Take 60 mg by mouth daily. Patient takes 1 and 1/2 tablet by mouth once daily. (60mg )   gabapentin 400 MG capsule Commonly known as: NEURONTIN Take 1 capsule (400 mg total) by mouth at bedtime.   hydrALAZINE 50 MG tablet Commonly known as: APRESOLINE Take 2 tablets (100 mg total) by mouth 3 (three) times daily.   HYDROmorphone 2 MG tablet Commonly known as: DILAUDID Take 1 mg by mouth 2 (two) times daily.   labetalol 200 MG tablet Commonly known as: NORMODYNE Take 0.5 tablets (100 mg total) by mouth 2 (two) times daily.   nicotine 21 mg/24hr patch Commonly known as: NICODERM CQ - dosed in mg/24 hours Place 1 patch (21 mg total) onto the skin daily.   nitroGLYCERIN 0.4 MG SL tablet Commonly known as: NITROSTAT Place 1 tablet (0.4 mg total) under the tongue every 5 (five) minutes as needed for chest pain.   nystatin powder Commonly known  as: MYCOSTATIN/NYSTOP APPLY  TOPICALLY TWICE DAILY AS NEEDED What changed: See the new instructions.   traZODone 150 MG tablet Commonly known as: DESYREL TAKE 1 TABLET AT BEDTIME   triamcinolone cream 0.1 % Commonly known as: KENALOG Apply 1 application topically 2 (two) times daily.       Discharge Assessment: Vitals:   07/19/19 0000 07/19/19 0410  BP: (!) 145/47 (!) 116/37  Pulse: (!) 55 67  Resp: 16 16  Temp: 97.6 F (36.4 C) 98.7 F (37.1 C)  SpO2: 94% 92%   Skin clean, dry and intact without evidence of skin break down, no  evidence of skin tears noted. IV catheter discontinued intact. Site without signs and symptoms of complications - no redness or edema noted at insertion site, patient denies c/o pain - only slight tenderness at site.  Dressing with slight pressure applied.  D/c Instructions-Education: Discharge instructions given to patient/family with verbalized understanding. D/c education completed with patient/family including follow up instructions, medication list, d/c activities limitations if indicated, with other d/c instructions as indicated by MD - patient able to verbalize understanding, all questions fully answered. Patient instructed to return to ED, call 911, or call MD for any changes in condition.  Patient escorted via [place], and D/C home via private auto.  Santa Lighter, RN 07/19/2019 6:22 PM

## 2019-07-19 NOTE — Plan of Care (Signed)
  Problem: Education: Goal: Knowledge of General Education information will improve Description Including pain rating scale, medication(s)/side effects and non-pharmacologic comfort measures Outcome: Progressing   Problem: Health Behavior/Discharge Planning: Goal: Ability to manage health-related needs will improve Outcome: Progressing   

## 2019-07-19 NOTE — Progress Notes (Signed)
2D echo complete 

## 2019-07-19 NOTE — Discharge Summary (Addendum)
Physician Discharge Summary  Paula Long [id number] DOB: Sep 29, 1950 DOA: 07/18/2019  PCP: Alycia Rossetti, MD  Admit date: 07/18/2019 Discharge date: 07/19/2019  Admitted From: Home Disposition: Home  Recommendations for Outpatient Follow-up:  1. Follow up with PCP in 1-2 weeks 2. Please obtain BMP/CBC in one week 3. Follow-up with cardiology for outpatient stress testing  Discharge Condition: Stable CODE STATUS: Full code Diet recommendation: Heart healthy  Brief/Interim Summary: 69 year old female with a history of hypertension, COPD, atrial flutter, tobacco use, presents to the emergency room with chest discomfort.  She had reported symptoms for approximately the past month.  It is described as mild right-sided chest pressure.  She had associated nausea and diaphoresis.  Symptoms lasted up to 6 hours.  She had no associated shortness of breath.  She received nitroglycerin in the emergency room and felt better.  EKG did not show any acute changes and cardiac enzymes are negative.  She was noted to be markedly hypertensive on arrival with a systolic blood pressure over 200.  She reports compliance with her medications.  Her husband reported that blood pressures usually run between 1 70-1 90 at home.  Patient was seen by cardiology with recommendations for overnight observation.  Overnight, she did not have any recurrence of symptoms.  Enzymes remain negative.  Echocardiogram was also unrevealing.  Blood pressure has improved.  Diltiazem was discontinued in favor of amlodipine so as to allow further titration of labetalol if needed.  Heart rate has been running in the 60s.  Plan is for outpatient follow-up for stress testing.  Patient is anxious to discharge home today.  Discharge Diagnoses:  Active Problems:   Chronic back pain   GERD (gastroesophageal reflux disease)   COPD (chronic obstructive pulmonary disease) (HCC)   Tobacco user   Chest pain   Hypertensive urgency    Paroxysmal atrial flutter [facility])    Discharge Instructions  Discharge Instructions    Diet - low sodium heart healthy   Complete by: As directed    Increase activity slowly   Complete by: As directed      Allergies as of 07/19/2019      Reactions   Hydrocodone-acetaminophen    Oxycodone-acetaminophen    Sulfa Antibiotics    Nystatin Rash   Oral rash   Percocet [oxycodone-acetaminophen] Rash   Oral rash   Vesicare [solifenacin] Rash   Vicodin [hydrocodone-acetaminophen] Rash   Oral rash      Medication List    STOP taking these medications   diltiazem 60 MG tablet Commonly known as: CARDIZEM   doxycycline 100 MG tablet Commonly known as: VIBRA-TABS   DULoxetine 30 MG capsule Commonly known as: Cymbalta   predniSONE 20 MG tablet Commonly known as: DELTASONE     TAKE these medications   albuterol (2.5 MG/3ML) 0.083% nebulizer solution Commonly known as: PROVENTIL INHALE THE CONTENTS OF 1 VIAL VIA NEBULIZER EVERY 4 HOURS AS NEEDED FOR WHEEZING OR SHORTNESS OF BREATH   albuterol 108 (90 Base) MCG/ACT inhaler Commonly known as: VENTOLIN HFA INHALE 2 PUFFS EVERY 4 HOURS AS NEEDED FOR WHEEZING OR SHORTNESS OF BREATH   amLODipine 10 MG tablet Commonly known as: NORVASC Take 1 tablet (10 mg total) by mouth daily. Start taking on: Jul 20, 2019   apixaban 5 MG Tabs tablet Commonly known as: Eliquis Take 1 tablet (5 mg total) by mouth 2 (two) times daily.   atorvastatin 10 MG tablet Commonly known as: LIPITOR TAKE 1 TABLET BY MOUTH ONCE DAILY  AT 6:00 PM.   Clobetasol Prop Emollient Base 0.05 % emollient cream Apply to affected areas BID   desmopressin 0.1 MG tablet Commonly known as: DDAVP TAKE 1 TABLET AT BEDTIME FOR BLADDER What changed: See the new instructions.   Dexilant 60 MG capsule Generic drug: dexlansoprazole Take 1 capsule (60 mg total) by mouth daily. What changed:   how much to take  when to take this  reasons to take this    dextromethorphan-guaiFENesin 30-600 MG 12hr tablet Commonly known as: MUCINEX DM Take 1 tablet by mouth 2 (two) times daily.   EasyMax Test test strip Generic drug: glucose blood USE TO TEST ONCE DAILY.   furosemide 40 MG tablet Commonly known as: LASIX Take 60 mg by mouth daily. Patient takes 1 and 1/2 tablet by mouth once daily. (60mg )   gabapentin 400 MG capsule Commonly known as: NEURONTIN Take 1 capsule (400 mg total) by mouth at bedtime.   hydrALAZINE 50 MG tablet Commonly known as: APRESOLINE Take 2 tablets (100 mg total) by mouth 3 (three) times daily.   HYDROmorphone 2 MG tablet Commonly known as: DILAUDID Take 1 mg by mouth 2 (two) times daily.   labetalol 200 MG tablet Commonly known as: NORMODYNE Take 0.5 tablets (100 mg total) by mouth 2 (two) times daily.   nicotine 21 mg/24hr patch Commonly known as: NICODERM CQ - dosed in mg/24 hours Place 1 patch (21 mg total) onto the skin daily.   nitroGLYCERIN 0.4 MG SL tablet Commonly known as: NITROSTAT Place 1 tablet (0.4 mg total) under the tongue every 5 (five) minutes as needed for chest pain.   nystatin powder Commonly known as: MYCOSTATIN/NYSTOP APPLY  TOPICALLY TWICE DAILY AS NEEDED What changed: See the new instructions.   traZODone 150 MG tablet Commonly known as: DESYREL TAKE 1 TABLET AT BEDTIME   triamcinolone cream 0.1 % Commonly known as: KENALOG Apply 1 application topically 2 (two) times daily.       Allergies  Allergen Reactions  . Hydrocodone-Acetaminophen   . Oxycodone-Acetaminophen   . Sulfa Antibiotics   . Nystatin Rash    Oral rash  . Percocet [Oxycodone-Acetaminophen] Rash    Oral rash  . Vesicare [Solifenacin] Rash  . Vicodin [Hydrocodone-Acetaminophen] Rash    Oral rash    Consultations:  Cardiology   Procedures/Studies: DG Chest 2 View  Result Date: 07/18/2019 CLINICAL DATA:  Chest pain EXAM: CHEST - 2 VIEW COMPARISON:  April 18, 2019 FINDINGS: There is  minimal scarring in the left base. The lungs elsewhere are clear. The heart size and pulmonary vascularity are normal. No adenopathy. There is aortic atherosclerosis. There is calcification in each carotid artery. No bone lesions. IMPRESSION: Minimal scarring left base. Lungs otherwise clear. Heart size normal. Bilateral carotid artery calcification. Aortic Atherosclerosis (ICD10-I70.0). Electronically Signed   By: Lowella Grip III M.D.   On: 07/18/2019 13:15   ECHOCARDIOGRAM COMPLETE  Result Date: 07/19/2019    ECHOCARDIOGRAM REPORT   Patient Name:   Paula Long Date of Exam: 07/19/2019 Medical Rec #:  [id number]       Height:       65.0 in Accession #:    [id number]      Weight:       211.6 lb Date of Birth:  1950/04/11      BSA:          2.026 m Patient Age:    69 years        BP:  116/37 mmHg Patient Gender: F               HR:           67 bpm. Exam Location:  [facility] Procedure: 2D Echo Indications:    Chest Pain 786.50 / R07.9  History:        Patient has prior history of Echocardiogram examinations, most                 recent 09/29/2018. CAD, COPD, Arrythmias:Atrial Flutter; Risk                 Factors:Current Smoker, Hypertension, Diabetes and Dyslipidemia.  Sonographer:    Leavy Cella RDCS (AE) Referring Phys: [place]  1. Normal LV systolic function; moderate LVH; grade 1 diastolic dysfunction.  2. Left ventricular ejection fraction, by estimation, is 60 to 65%. The left ventricle has normal function. The left ventricle has no regional wall motion abnormalities. There is moderate left ventricular hypertrophy. Left ventricular diastolic parameters are consistent with Grade I diastolic dysfunction (impaired relaxation). Elevated left atrial pressure.  3. Right ventricular systolic function is normal. The right ventricular size is normal.  4. The mitral valve is normal in structure. Trivial mitral valve regurgitation. No evidence of mitral stenosis.  5. The  aortic valve is tricuspid. Aortic valve regurgitation is not visualized. Mild aortic valve sclerosis is present, with no evidence of aortic valve stenosis.  6. The inferior vena cava is normal in size with greater than 50% respiratory variability, suggesting right atrial pressure of 3 mmHg. FINDINGS  Left Ventricle: Left ventricular ejection fraction, by estimation, is 60 to 65%. The left ventricle has normal function. The left ventricle has no regional wall motion abnormalities. The left ventricular internal cavity size was normal in size. There is  moderate left ventricular hypertrophy. Left ventricular diastolic parameters are consistent with Grade I diastolic dysfunction (impaired relaxation). Elevated left atrial pressure. Right Ventricle: The right ventricular size is normal. Right ventricular systolic function is normal. Left Atrium: Left atrial size was normal in size. Right Atrium: Right atrial size was normal in size. Pericardium: There is no evidence of pericardial effusion. Mitral Valve: The mitral valve is normal in structure. Normal mobility of the mitral valve leaflets. Mild mitral annular calcification. Trivial mitral valve regurgitation. No evidence of mitral valve stenosis. Tricuspid Valve: The tricuspid valve is normal in structure. Tricuspid valve regurgitation is trivial. No evidence of tricuspid stenosis. Aortic Valve: The aortic valve is tricuspid. Aortic valve regurgitation is not visualized. Mild aortic valve sclerosis is present, with no evidence of aortic valve stenosis. Pulmonic Valve: The pulmonic valve was normal in structure. Pulmonic valve regurgitation is not visualized. No evidence of pulmonic stenosis. Aorta: The aortic root is normal in size and structure. Venous: The inferior vena cava is normal in size with greater than 50% respiratory variability, suggesting right atrial pressure of 3 mmHg. IAS/Shunts: No atrial level shunt detected by color flow Doppler. Additional Comments:  Normal LV systolic function; moderate LVH; grade 1 diastolic dysfunction. Kirk Ruths MD Electronically signed by Kirk Ruths MD Signature Date/Time: 07/19/2019/1:23:14 PM    Final       Subjective: Patient has not had any recurrence of symptoms since admission.  She wants to go home.  Discharge Exam: Vitals:   07/18/19 2059 07/18/19 2129 07/19/19 0000 07/19/19 0410  BP: (!) 146/59  (!) 145/47 (!) 116/37  Pulse: (!) 58  (!) 55 67  Resp: 16  16 16  Temp: 97.8 F (36.6 C)  97.6 F (36.4 C) 98.7 F (37.1 C)  TempSrc: Oral   Oral  SpO2: 94%  94% 92%  Weight:  96 kg    Height:  5\' 5"  (1.651 m)      General: Pt is alert, awake, not in acute distress Cardiovascular: RRR, S1/S2 +, no rubs, no gallops Respiratory: CTA bilaterally, no wheezing, no rhonchi Abdominal: Soft, NT, ND, bowel sounds + Extremities: no edema, no cyanosis    The results of significant diagnostics from this hospitalization (including imaging, microbiology, ancillary and laboratory) are listed below for reference.     Microbiology: Recent Results (from the past 240 hour(s))  Novel Coronavirus, NAA ([facility])     Status: None   Collection Time: 07/17/19  2:31 PM   Specimen: Nasopharyngeal(NP) swabs in vial transport medium   NASOPHARYNGE  TESTING  Result Value Ref Range Status   SARS-CoV-2, NAA Not Detected Not Detected Final    Comment: This nucleic acid amplification test was developed and its performance characteristics determined by [employer]. Nucleic acid amplification tests include RT-PCR and TMA. This test has not been FDA cleared or approved. This test has been authorized by FDA under an Emergency Use Authorization (EUA). This test is only authorized for the duration of time the declaration that circumstances exist justifying the authorization of the emergency use of in vitro diagnostic tests for detection of SARS-CoV-2 virus and/or diagnosis of COVID-19 infection under section  564(b)(1) of the Act, 21 U.S.C. [id number]) (1), unless the authorization is terminated or revoked sooner. When diagnostic testing is negative, the possibility of a false negative result should be considered in the context of a patient's recent exposures and the presence of clinical signs and symptoms consistent with COVID-19. An individual without symptoms of COVID-19 and who is not shedding SARS-CoV-2 virus wo uld expect to have a negative (not detected) result in this assay.   SARS-COV-2, NAA 2 DAY TAT     Status: None   Collection Time: 07/17/19  2:31 PM   NASOPHARYNGE  TESTING  Result Value Ref Range Status   SARS-CoV-2, NAA 2 DAY TAT Performed  Final  Respiratory Panel by RT PCR (Flu A&B, Covid) - Nasopharyngeal Swab     Status: None   Collection Time: 07/18/19  2:46 PM   Specimen: Nasopharyngeal Swab  Result Value Ref Range Status   SARS Coronavirus 2 by RT PCR NEGATIVE NEGATIVE Final    Comment: (NOTE) SARS-CoV-2 target nucleic acids are NOT DETECTED. The SARS-CoV-2 RNA is generally detectable in upper respiratoy specimens during the acute phase of infection. The lowest concentration of SARS-CoV-2 viral copies this assay can detect is 131 copies/mL. A negative result does not preclude SARS-Cov-2 infection and should not be used as the sole basis for treatment or other patient management decisions. A negative result may occur with  improper specimen collection/handling, submission of specimen other than nasopharyngeal swab, presence of viral mutation(s) within the areas targeted by this assay, and inadequate number of viral copies (<131 copies/mL). A negative result must be combined with clinical observations, patient history, and epidemiological information. The expected result is Negative. Fact Sheet for Patients:  PinkCheek.be Fact Sheet for Healthcare Providers:  GravelBags.it This test is not yet ap proved or  cleared by the [place] FDA and  has been authorized for detection and/or diagnosis of SARS-CoV-2 by FDA under an Emergency Use Authorization (EUA). This EUA will remain  in effect (meaning this test can be used) for  the duration of the COVID-19 declaration under Section 564(b)(1) of the Act, 21 U.S.C. section 360bbb-3(b)(1), unless the authorization is terminated or revoked sooner.    Influenza A by PCR NEGATIVE NEGATIVE Final   Influenza B by PCR NEGATIVE NEGATIVE Final    Comment: (NOTE) The Xpert Xpress SARS-CoV-2/FLU/RSV assay is intended as an aid in  the diagnosis of influenza from Nasopharyngeal swab specimens and  should not be used as a sole basis for treatment. Nasal washings and  aspirates are unacceptable for Xpert Xpress SARS-CoV-2/FLU/RSV  testing. Fact Sheet for Patients: PinkCheek.be Fact Sheet for Healthcare Providers: GravelBags.it This test is not yet approved or cleared by the [place] FDA and  has been authorized for detection and/or diagnosis of SARS-CoV-2 by  FDA under an Emergency Use Authorization (EUA). This EUA will remain  in effect (meaning this test can be used) for the duration of the  Covid-19 declaration under Section 564(b)(1) of the Act, 21  U.S.C. section 360bbb-3(b)(1), unless the authorization is  terminated or revoked. Performed at [facility], [address] [place], [place] [address]      Labs: BNP (last 3 results) No results for input(s): BNP in the last 8760 hours. Basic Metabolic Panel: Recent Labs  Lab 07/18/19 1238  NA 135  K 3.3*  CL 99  CO2 24  GLUCOSE 99  BUN 37*  CREATININE 1.74*  CALCIUM 9.2   Liver Function Tests: No results for input(s): AST, ALT, ALKPHOS, BILITOT, PROT, ALBUMIN in the last 168 hours. No results for input(s): LIPASE, AMYLASE in the last 168 hours. No results for input(s): AMMONIA in the last 168 hours. CBC: Recent Labs  Lab  07/18/19 1238  WBC 10.9*  HGB 13.2  HCT 40.6  MCV 96.0  PLT 270   Cardiac Enzymes: No results for input(s): CKTOTAL, CKMB, CKMBINDEX, TROPONINI in the last 168 hours. BNP: Invalid input(s): POCBNP CBG: No results for input(s): GLUCAP in the last 168 hours. D-Dimer No results for input(s): DDIMER in the last 72 hours. Hgb A1c No results for input(s): HGBA1C in the last 72 hours. Lipid Profile No results for input(s): CHOL, HDL, LDLCALC, TRIG, CHOLHDL, LDLDIRECT in the last 72 hours. Thyroid function studies No results for input(s): TSH, T4TOTAL, T3FREE, THYROIDAB in the last 72 hours.  Invalid input(s): FREET3 Anemia work up No results for input(s): VITAMINB12, FOLATE, FERRITIN, TIBC, IRON, RETICCTPCT in the last 72 hours. Urinalysis    Component Value Date/Time   COLORURINE YELLOW 09/29/2018 1252   APPEARANCEUR CLEAR 09/29/2018 1252   LABSPEC 1.015 09/29/2018 1252   PHURINE 5.0 09/29/2018 1252   GLUCOSEU 150 (A) 09/29/2018 1252   HGBUR NEGATIVE 09/29/2018 1252   BILIRUBINUR NEGATIVE 09/29/2018 1252   BILIRUBINUR neg 03/11/2012 1538   KETONESUR NEGATIVE 09/29/2018 1252   PROTEINUR NEGATIVE 09/29/2018 1252   UROBILINOGEN 0.2 10/13/2013 1517   NITRITE NEGATIVE 09/29/2018 1252   LEUKOCYTESUR NEGATIVE 09/29/2018 1252   Sepsis Labs Invalid input(s): PROCALCITONIN,  WBC,  LACTICIDVEN Microbiology Recent Results (from the past 240 hour(s))  Novel Coronavirus, NAA ([facility])     Status: None   Collection Time: 07/17/19  2:31 PM   Specimen: Nasopharyngeal(NP) swabs in vial transport medium   NASOPHARYNGE  TESTING  Result Value Ref Range Status   SARS-CoV-2, NAA Not Detected Not Detected Final    Comment: This nucleic acid amplification test was developed and its performance characteristics determined by [employer]. Nucleic acid amplification tests include RT-PCR and TMA. This test has not been  FDA cleared or approved. This test has been authorized by FDA  under an Emergency Use Authorization (EUA). This test is only authorized for the duration of time the declaration that circumstances exist justifying the authorization of the emergency use of in vitro diagnostic tests for detection of SARS-CoV-2 virus and/or diagnosis of COVID-19 infection under section 564(b)(1) of the Act, 21 U.S.C. [id number]) (1), unless the authorization is terminated or revoked sooner. When diagnostic testing is negative, the possibility of a false negative result should be considered in the context of a patient's recent exposures and the presence of clinical signs and symptoms consistent with COVID-19. An individual without symptoms of COVID-19 and who is not shedding SARS-CoV-2 virus wo uld expect to have a negative (not detected) result in this assay.   SARS-COV-2, NAA 2 DAY TAT     Status: None   Collection Time: 07/17/19  2:31 PM   NASOPHARYNGE  TESTING  Result Value Ref Range Status   SARS-CoV-2, NAA 2 DAY TAT Performed  Final  Respiratory Panel by RT PCR (Flu A&B, Covid) - Nasopharyngeal Swab     Status: None   Collection Time: 07/18/19  2:46 PM   Specimen: Nasopharyngeal Swab  Result Value Ref Range Status   SARS Coronavirus 2 by RT PCR NEGATIVE NEGATIVE Final    Comment: (NOTE) SARS-CoV-2 target nucleic acids are NOT DETECTED. The SARS-CoV-2 RNA is generally detectable in upper respiratoy specimens during the acute phase of infection. The lowest concentration of SARS-CoV-2 viral copies this assay can detect is 131 copies/mL. A negative result does not preclude SARS-Cov-2 infection and should not be used as the sole basis for treatment or other patient management decisions. A negative result may occur with  improper specimen collection/handling, submission of specimen other than nasopharyngeal swab, presence of viral mutation(s) within the areas targeted by this assay, and inadequate number of viral copies (<131 copies/mL). A negative result must be  combined with clinical observations, patient history, and epidemiological information. The expected result is Negative. Fact Sheet for Patients:  PinkCheek.be Fact Sheet for Healthcare Providers:  GravelBags.it This test is not yet ap proved or cleared by the [place] FDA and  has been authorized for detection and/or diagnosis of SARS-CoV-2 by FDA under an Emergency Use Authorization (EUA). This EUA will remain  in effect (meaning this test can be used) for the duration of the COVID-19 declaration under Section 564(b)(1) of the Act, 21 U.S.C. section 360bbb-3(b)(1), unless the authorization is terminated or revoked sooner.    Influenza A by PCR NEGATIVE NEGATIVE Final   Influenza B by PCR NEGATIVE NEGATIVE Final    Comment: (NOTE) The Xpert Xpress SARS-CoV-2/FLU/RSV assay is intended as an aid in  the diagnosis of influenza from Nasopharyngeal swab specimens and  should not be used as a sole basis for treatment. Nasal washings and  aspirates are unacceptable for Xpert Xpress SARS-CoV-2/FLU/RSV  testing. Fact Sheet for Patients: PinkCheek.be Fact Sheet for Healthcare Providers: GravelBags.it This test is not yet approved or cleared by the [place] FDA and  has been authorized for detection and/or diagnosis of SARS-CoV-2 by  FDA under an Emergency Use Authorization (EUA). This EUA will remain  in effect (meaning this test can be used) for the duration of the  Covid-19 declaration under Section 564(b)(1) of the Act, 21  U.S.C. section 360bbb-3(b)(1), unless the authorization is  terminated or revoked. Performed at [facility], [address], [place], [place] [address]      Time coordinating  discharge: 30mins  SIGNED:   Kathie Dike, MD  [facility] 07/19/2019, 2:23 PM   If 7PM-7AM, please contact night-coverage [email or web address]

## 2019-07-23 ENCOUNTER — Encounter (HOSPITAL_COMMUNITY)
Admission: RE | Admit: 2019-07-23 | Discharge: 2019-07-23 | Disposition: A | Discharge status: Home / Self Care | Payer: Medicare HMO | Source: Ambulatory Visit | Attending: Family Medicine | Admitting: Family Medicine

## 2019-07-23 ENCOUNTER — Ambulatory Visit (HOSPITAL_COMMUNITY): Payer: Medicare HMO

## 2019-07-23 ENCOUNTER — Encounter (HOSPITAL_COMMUNITY): Payer: Self-pay

## 2019-07-23 ENCOUNTER — Other Ambulatory Visit: Payer: Self-pay

## 2019-07-23 DIAGNOSIS — R079 Chest pain, unspecified: Secondary | ICD-10-CM | POA: Diagnosis not present

## 2019-07-23 LAB — NM MYOCAR MULTI W/SPECT W/WALL MOTION / EF
LV dias vol: 78 mL (ref 46–106)
LV sys vol: 31 mL
Peak HR: 103 {beats}/min
RATE: 0.37
Rest HR: 58 {beats}/min
SDS: 1
SRS: 1
SSS: 2
TID: 1.13

## 2019-07-23 MED ORDER — REGADENOSON 0.4 MG/5ML IV SOLN
INTRAVENOUS | Status: AC
  Administered 2019-07-23: 11:00:00 0.4 mg via INTRAVENOUS
  Filled 2019-07-23: qty 5

## 2019-07-23 MED ORDER — TECHNETIUM TC 99M TETROFOSMIN IV KIT
10.0000 | PACK | Freq: Once | INTRAVENOUS | Status: AC | PRN
  Administered 2019-07-23: 10:00:00 10.6 via INTRAVENOUS

## 2019-07-23 MED ORDER — TECHNETIUM TC 99M TETROFOSMIN IV KIT
30.0000 | PACK | Freq: Once | INTRAVENOUS | Status: AC | PRN
  Administered 2019-07-23: 11:00:00 30.7 via INTRAVENOUS

## 2019-07-23 MED ORDER — SODIUM CHLORIDE FLUSH 0.9 % IV SOLN
INTRAVENOUS | Status: AC
  Administered 2019-07-23: 11:00:00 10 mL via INTRAVENOUS
  Filled 2019-07-23: qty 10

## 2019-07-23 NOTE — Chronic Care Management (AMB) (Signed)
Chronic Care Management Pharmacy  Name: Paula Long  MRN: [id number] DOB: 09-10-1950   Chief Complaint/ HPI  Paula Long,  69 y.o. , female presents for their Initial CCM visit with the clinical pharmacist via telephone.  PCP : Alycia Rossetti, MD  Their chronic conditions include: hypertension, COPD, GERD, CKD, diabetes hyperlipidemia.  Office Visits:  07/16/2019 [facility] [place]) - Called in with yellow mucus possible COPD exacerbation or sinusitis, started doxycycline and prednisone 72m for 5 days and Mucinex DM.  Also recommended COVID-19 testing  04/14/2019 ([facility] - Elevated BP in office, patient to continue to monitor BP at home, checking renal function.   Consult Visit:  07/18/2019 ([facility]) - Presented for chest pain, systolic > 2614upon arrival to ED, discontinued diltiazem and switched to amlodipine 188mdaily, patient anxious to discharge home  07[facility]/2019 [facility] - Came in for 8/10 chest pressure and said "doesn't feel right".  EKG showed sinus bradycardia (rate of 48)  07/01/2019 ([facility]- Patient used to be on ACEI/ARB but was d/c due to worsening renal function, long time smoker - had one more pack left and then was going to stop  06/06/2019 ([facility]- Came in for really elevated blood pressures, home BPs 180s-90s, increased hydralazine to 7522mid   04/23/2019 ([facility]) - Referred for syncope, chest pain at home, leaned over kitchen sink, woke up with ambulance helping her out.  Medications: Outpatient Encounter Medications as of 07/24/2019  Medication Sig Note  . albuterol (PROVENTIL) (2.5 MG/3ML) 0.083% nebulizer solution INHALE THE CONTENTS OF 1 VIAL VIA NEBULIZER EVERY 4 HOURS AS NEEDED FOR WHEEZING OR SHORTNESS OF BREATH   . albuterol (VENTOLIN HFA) 108 (90 Base) MCG/ACT inhaler INHALE 2 PUFFS EVERY 4 HOURS AS NEEDED FOR WHEEZING OR SHORTNESS OF BREATH   . amLODipine (NORVASC) 10 MG tablet Take 1 tablet (10 mg total) by mouth  daily.   . aMarland Kitchenixaban (ELIQUIS) 5 MG TABS tablet Take 1 tablet (5 mg total) by mouth 2 (two) times daily.   . aMarland Kitchenorvastatin (LIPITOR) 10 MG tablet TAKE 1 TABLET BY MOUTH ONCE DAILY AT 6:00 PM.   . desmopressin (DDAVP) 0.1 MG tablet TAKE 1 TABLET AT BEDTIME FOR BLADDER (Patient taking differently: Take 0.1 mg by mouth at bedtime. For bladder)   . dexlansoprazole (DEXILANT) 60 MG capsule Take 1 capsule (60 mg total) by mouth daily. (Patient taking differently: Take 1 capsule by mouth daily as needed. )   . EASYMAX TEST test strip USE TO TEST ONCE DAILY.   . furosemide (LASIX) 40 MG tablet Take 60 mg by mouth daily. Patient takes 1 and 1/2 tablet by mouth once daily. (94m36m . hydrALAZINE (APRESOLINE) 50 MG tablet Take 2 tablets (100 mg total) by mouth 3 (three) times daily.   . HYMarland KitchenROmorphone (DILAUDID) 2 MG tablet Take 1 mg by mouth 2 (two) times daily.    . laMarland Kitchenetalol (NORMODYNE) 200 MG tablet Take 0.5 tablets (100 mg total) by mouth 2 (two) times daily. (Patient taking differently: Take 200 mg by mouth 2 (two) times daily. )   . nitroGLYCERIN (NITROSTAT) 0.4 MG SL tablet Place 1 tablet (0.4 mg total) under the tongue every 5 (five) minutes as needed for chest pain. 07/18/2019: Patient has taken 6 tablets today as of 07/18/2019 _0   . traZODone (DESYREL) 150 MG tablet TAKE 1 TABLET AT BEDTIME   . Clobetasol Prop Emollient Base 0.05 % emollient cream Apply to affected areas BID (Patient not taking: Reported  on 07/18/2019)   . dextromethorphan-guaiFENesin (MUCINEX DM) 30-600 MG 12hr tablet Take 1 tablet by mouth 2 (two) times daily. (Patient not taking: Reported on 07/18/2019)   . gabapentin (NEURONTIN) 400 MG capsule Take 1 capsule (400 mg total) by mouth at bedtime. (Patient not taking: Reported on 07/24/2019)   . nicotine (NICODERM CQ - DOSED IN MG/24 HOURS) 21 mg/24hr patch Place 1 patch (21 mg total) onto the skin daily. (Patient not taking: Reported on 07/24/2019)   . nystatin (MYCOSTATIN/NYSTOP) powder  APPLY  TOPICALLY TWICE DAILY AS NEEDED (Patient not taking: APPLY  TOPICALLY TWICE DAILY AS NEEDED)   . triamcinolone cream (KENALOG) 0.1 % Apply 1 application topically 2 (two) times daily. (Patient not taking: Reported on 07/18/2019)    No facility-administered encounter medications on file as of 07/24/2019.     Current Diagnosis/Assessment:    Emergency planning/management officer Strain: Medium Risk  . Difficulty of Paying Living Expenses: Somewhat hard    Goals Addressed            This Visit's Progress   . COPD/Tobacco Use       CARE PLAN ENTRY (see longitudinal plan of care for additional care plan information)  Current Barriers:  . 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 Previous quit attempts, unsuccessful d/t cost of nicotine patches.  . Reports motivation to quit smoking includes: her current medical condition and warnings from cardiologist  Pharmacist Clinical Goal(s):  Marland Kitchen Over the next 30 days, patient will work with PharmD and provider towards tobacco cessation  Interventions: . Comprehensive medication review performed, medication list in electronic medical record updated . Inter-disciplinary care team collaboration (see longitudinal plan of care) . Recommend NRT patches + gum/lozenges. Recommend 21 mg patch which was called into pharmacy but she could not afford the copay. . Counseled on patch placement, side effects, and option to remove at night if they experience trouble sleeping or bad dreams.  . Provided contact information for [place] [facility] (1-800-QUIT-NOW). Patient will outreach this group for support and see if they will give her patches for free that she can try initially.   Patient Self Care Activities: . Over the next 30 days: o Patient will commit to reducing tobacco consumption. o Patient will reach out to [employer] (1-800-QUIT-NOW) to get assistance with obtaining patches.   Initial goal documentation      . Hyperlipidemia - LDL < 100       CARE  PLAN ENTRY (see longitudinal plan of care for additional care plan information)  Current Barriers:  . Controlled hyperlipidemia, complicated by hypertension, diabetes, and tobacco use. . Current antihyperlipidemic regimen: atorvastatin 73m daily . Previous antihyperlipidemic medications tried none . Most recent lipid panel:     Component Value Date/Time   CHOL 110 09/30/2018 0613   TRIG 37 09/30/2018 0613   HDL 49 09/30/2018 0613   CHOLHDL 2.2 09/30/2018 0613   VLDL 7 09/30/2018 0613   LDLCALC 54 09/30/2018 0613   LDLCALC 55 08/27/2018 1035    Pharmacist Clinical Goal(s):  .Marland KitchenOver the next 30 days, patient will work with PharmD and providers towards optimized antihyperlipidemic therapy  Interventions: . Comprehensive medication review performed; medication list updated in electronic medical record.  .[facility] team collaboration (see longitudinal plan of care) . Continue current therapy.    Patient Self Care Activities:  . Patient will focus on medication adherence by pill count. . Over the next 30 days patient will notify PharmD of  any adverse effects related to medication.  Initial goal documentation     . Hypertension - < 140/90       CARE PLAN ENTRY (see longitudinal plan of care for additional care plan information)  Current Barriers:  . Uncontrolled hypertension, complicated by COPD/tobacco use, hyperlipidemia, and diabetes. . Current antihypertensive regimen: amlodipine 66m daily, labetolol 2051mtwice daily, hydralazine 10052mhree times daily . Previous antihypertensives tried: diltiazem ACEIs/ARBs . Last practice recorded BP readings:   . Current home BP readings: patient currently checking daily ranging from 115/57 to 159/76 with about half of her readings > 140/90. . Most recent eGFR/CrCl: 30 ml/min  Pharmacist Clinical Goal(s):  . OMarland Kitchener the next 30 days, patient will work with PharmD and providers to optimize antihypertensive  regimen  Interventions: . Inter-disciplinary care team collaboration (see longitudinal plan of care) . Comprehensive medication review performed; medication list updated in the electronic medical record.  . Continue current medications as BP has much improved from ED visits.  . Continue to monitor daily and record in log to provide at future appointments.  Patient Self Care Activities:  . Patient will continue to check BP daily , document, and provide at future appointments . Patient will focus on medication adherence by pill count. . Over the next 30 days she will notify PharmD or PCP if BP is consistently above > 140/90 when she is checking at home.  Initial goal documentation        Diabetes   Recent Relevant Labs: Lab Results  Component Value Date/Time   HGBA1C 5.7 (H) 04/14/2019 03:32 PM   HGBA1C 6.1 (H) 09/30/2018 06:13 AM      We discussed: patient currently on no medicatio for diabetes.  Trying to control with diet last A1c was prediabetic range.  Plan  Continue control with diet, patient trying to lose weight this way not really able to exercise at the moment due to pain in leg.  Hyperlipidemia   Lipid Panel     Component Value Date/Time   CHOL 110 09/30/2018 0613   TRIG 37 09/30/2018 0613   HDL 49 09/30/2018 0613   CHOLHDL 2.2 09/30/2018 0613   VLDL 7 09/30/2018 0613   LDLCALC 54 09/30/2018 0613   LDLCALC 55 08/27/2018 1035      Patient has failed these meds in past: none noted Patient is currently controlled on the following medications: atorvastatin 37m71mblet  We discussed:  diet and exercise extensively.  Unable to exercise d/t leg pain currently trying to lose weight per advice of cardioogy, cholesterol currently controlled patient coming late May for full physical from Dr. DurhBuelah Manislan  Continue current medications  Hypertension   Office blood pressures are  BP Readings from Last 3 Encounters:  07/19/19 (!) 116/37  07/16/19 (!) 160/79   07/08/19 (!) 128/50    Patient has failed these meds in the past: ACEIs/ARBs have caused patient to have acute kidney injuries multiple times in the past. (large increases in creatinine)  Patient checks BP at home daily.  If out of range she will ususally check an additional time daily.  Patient home BP readings are ranging:  Checks 8-8:30 am sometime before meds sometimes after:  140/73, 130/61, 154/73, 115/57, 159/76 patient reports higher reading are usually before her medications.   Patient is currently uncontrolled on the following medications: amlodipine 37mg24mbetolol 200mg 42mets, hydralazine 100mg t84mfurosemide 40mg   41miscussed she denies swelling with newly restarted amlodipine.  Reports that if she  does notice any swelling she wears compression stockings which resolves symptoms.  Plan  Continue current medications.  Although still elevated, blood pressures much improved since recent visits to the ED.  Patient counseled on proper use of nitroglycerin and given instructions on when to report to ED.    COPD/Tobacco    Eosinophil count:   Lab Results  Component Value Date/Time   EOSPCT 0 04/18/2019 02:25 PM  %                               Eos (Absolute):  Lab Results  Component Value Date/Time   EOSABS 0.0 04/18/2019 02:25 PM    Tobacco Status:  Social History   Tobacco Use  Smoking Status Current Every Day Smoker  . Packs/day: 2.00  . Years: 48.00  . Pack years: 96.00  . Types: Cigarettes  . Start date: 01/07/1970  Smokeless Tobacco Never Used  Tobacco Comment   since age 68. smoking 2-3 packs a day    Patient has failed these meds in past: Advair Patient is currently controlled on the following medications: albuterol inh, albuterol nebulizer Using maintenance inhaler regularly? No, she reports maybe using in once weekly Frequency of rescue inhaler use:  Once weekly  We discussed: She reports infrequent inhaler use and no breathing  exacerbations  We had a long talk about smoking cessation and risk associated with her current medical conditions.  Patient was interested in quitting and has cut back from 3 packs per day to 1 pack per day.  Her cardiologist called in nicotine patches for her but she did not pickup due to copay.  She is willing to give these a try but needs help with access.  Plan  Instructed patient to call the Quitline at 1-800-QUIT-NOW to try and get free nicotine patches.  She is to reach out to PharmD if she is in need of support to quit smoking and to update on status on nicotine patches.  GERD    Patient has failed these meds in past: none Patient is currently controlled on the following medications: Dexilant 31m  She is currently having mild symptoms taking medication just prn.  Counseled on proper administration of daily in the morning about 30 minutes before eating, she agreed to try.  Plan  Continue current medications, change dosing to daily instead of prn to see if this helps improve symptoms.    Medication Management   . Miscellaneous medications: gabapentin 4021m hydromorphone 71m55m. OTC's: nicotine patches,  . Patient currently uses [employer] . Patient reports using vials in two separate locations to help her remember to take medications. . Patient denies missed doses of medication.   ChrBeverly MilchharmD Clinical Pharmacist [phone]

## 2019-07-24 ENCOUNTER — Ambulatory Visit: Payer: Medicare HMO

## 2019-07-24 ENCOUNTER — Telehealth: Payer: Self-pay | Admitting: Family Medicine

## 2019-07-24 DIAGNOSIS — I10 Essential (primary) hypertension: Secondary | ICD-10-CM

## 2019-07-24 DIAGNOSIS — E785 Hyperlipidemia, unspecified: Secondary | ICD-10-CM

## 2019-07-24 DIAGNOSIS — Z720 Tobacco use: Secondary | ICD-10-CM

## 2019-07-24 NOTE — Telephone Encounter (Signed)
-----   Message from Verta Ellen., NP sent at 07/24/2019  7:52 AM EDT ----- Please call patient and tell her that stress test was normal.  No evidence of ischemia

## 2019-07-24 NOTE — Telephone Encounter (Signed)
Patient informed. Copy sent to PCP °

## 2019-07-24 NOTE — Telephone Encounter (Signed)
Asking for test results

## 2019-07-24 NOTE — Patient Instructions (Addendum)
Thank you for meeting with me today!  I look forward to working with you to help you meet all of your healthcare goals and answer any questions you may have.  Feel free to contact me anytime!   Visit Information  Goals Addressed            This Visit's Progress   . COPD/Tobacco Use       CARE PLAN ENTRY (see longitudinal plan of care for additional care plan information)  Current Barriers:  . 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 Previous quit attempts, unsuccessful d/t cost of nicotine patches.  . Reports motivation to quit smoking includes: her current medical condition and warnings from cardiologist  Pharmacist Clinical Goal(s):  Marland Kitchen Over the next 30 days, patient will work with PharmD and provider towards tobacco cessation  Interventions: . Comprehensive medication review performed, medication list in electronic medical record updated . Inter-disciplinary care team collaboration (see longitudinal plan of care) . Recommend NRT patches + gum/lozenges. Recommend 21 mg patch which was called into pharmacy but she could not afford the copay. . Counseled on patch placement, side effects, and option to remove at night if they experience trouble sleeping or bad dreams.  . Provided contact information for [place] [facility] (1-800-QUIT-NOW). Patient will outreach this group for support and see if they will give her patches for free that she can try initially.   Patient Self Care Activities: . Over the next 30 days: o Patient will commit to reducing tobacco consumption. o Patient will reach out to [employer] (1-800-QUIT-NOW) to get assistance with obtaining patches.   Initial goal documentation      . Hyperlipidemia - LDL < 100       CARE PLAN ENTRY (see longitudinal plan of care for additional care plan information)  Current Barriers:  . Controlled hyperlipidemia, complicated by hypertension, diabetes, and tobacco use. . Current antihyperlipidemic regimen:  atorvastatin 61m daily . Previous antihyperlipidemic medications tried none . Most recent lipid panel:     Component Value Date/Time   CHOL 110 09/30/2018 0613   TRIG 37 09/30/2018 0613   HDL 49 09/30/2018 0613   CHOLHDL 2.2 09/30/2018 0613   VLDL 7 09/30/2018 0613   LDLCALC 54 09/30/2018 0613   LDLCALC 55 08/27/2018 1035    Pharmacist Clinical Goal(s):  .Marland KitchenOver the next 30 days, patient will work with PharmD and providers towards optimized antihyperlipidemic therapy  Interventions: . Comprehensive medication review performed; medication list updated in electronic medical record.  .[facility] team collaboration (see longitudinal plan of care) . Continue current therapy.    Patient Self Care Activities:  . Patient will focus on medication adherence by pill count. . Over the next 30 days patient will notify PharmD of any adverse effects related to medication.  Initial goal documentation     . Hypertension - < 140/90       CARE PLAN ENTRY (see longitudinal plan of care for additional care plan information)  Current Barriers:  . Uncontrolled hypertension, complicated by COPD/tobacco use, hyperlipidemia, and diabetes. . Current antihypertensive regimen: amlodipine 111mdaily, labetolol 20087mwice daily, hydralazine 100m29mree times daily . Previous antihypertensives tried: diltiazem ACEIs/ARBs . Last practice recorded BP readings:   . Current home BP readings: patient currently checking daily ranging from 115/57 to 159/76 with about half of her readings > 140/90. . Most recent eGFR/CrCl: 30 ml/min  Pharmacist Clinical Goal(s):  . OvMarland Kitchenr the next 30 days, patient  will work with PharmD and providers to optimize antihypertensive regimen  Interventions: . Inter-disciplinary care team collaboration (see longitudinal plan of care) . Comprehensive medication review performed; medication list updated in the electronic medical record.  . Continue current medications as  BP has much improved from ED visits.  . Continue to monitor daily and record in log to provide at future appointments.  Patient Self Care Activities:  . Patient will continue to check BP daily , document, and provide at future appointments . Patient will focus on medication adherence by pill count. . Over the next 30 days she will notify PharmD or PCP if BP is consistently above > 140/90 when she is checking at home.  Initial goal documentation        Paula Long was given information about Chronic Care Management services today including:  1. CCM service includes personalized support from designated clinical staff supervised by her physician, including individualized plan of care and coordination with other care providers 2. 24/7 contact phone numbers for assistance for urgent and routine care needs. 3. Standard insurance, coinsurance, copays and deductibles apply for chronic care management only during months in which we provide at least 20 minutes of these services. Most insurances cover these services at 100%, however patients may be responsible for any copay, coinsurance and/or deductible if applicable. This service may help you avoid the need for more expensive face-to-face services. 4. Only one practitioner may furnish and bill the service in a calendar month. 5. The patient may stop CCM services at any time (effective at the end of the month) by phone call to the office staff.  Patient agreed to services and verbal consent obtained.   The patient verbalized understanding of instructions provided today and agreed to receive a mailed copy of patient instruction and/or educational materials. Telephone follow up appointment with pharmacy team member scheduled for: 08/27/2019 @ 12:30 PM  Beverly Milch, PharmD Clinical Pharmacist [facility] [phone]   Steps to Quit Smoking Smoking tobacco is the leading cause of preventable death. It can affect almost every  organ in the body. Smoking puts you and people around you at risk for many serious, long-lasting (chronic) diseases. Quitting smoking can be hard, but it is one of the best things that you can do for your health. It is never too late to quit. How do I get ready to quit? When you decide to quit smoking, make a plan to help you succeed. Before you quit:  Pick a date to quit. Set a date within the next 2 weeks to give you time to prepare.  Write down the reasons why you are quitting. Keep this list in places where you will see it often.  Tell your family, friends, and co-workers that you are quitting. Their support is important.  Talk with your doctor about the choices that may help you quit.  Find out if your health insurance will pay for these treatments.  Know the people, places, things, and activities that make you want to smoke (triggers). Avoid them. What first steps can I take to quit smoking?  Throw away all cigarettes at home, at work, and in your car.  Throw away the things that you use when you smoke, such as ashtrays and lighters.  Clean your car. Make sure to empty the ashtray.  Clean your home, including curtains and carpets. What can I do to help me quit smoking? Talk with your doctor about taking medicines and seeing a counselor  at the same time. You are more likely to succeed when you do both.  If you are pregnant or breastfeeding, talk with your doctor about counseling or other ways to quit smoking. Do not take medicine to help you quit smoking unless your doctor tells you to do so. To quit smoking: Quit right away  Quit smoking totally, instead of slowly cutting back on how much you smoke over a period of time.  Go to counseling. You are more likely to quit if you go to counseling sessions regularly. Take medicine You may take medicines to help you quit. Some medicines need a prescription, and some you can buy over-the-counter. Some medicines may contain a drug  called nicotine to replace the nicotine in cigarettes. Medicines may:  Help you to stop having the desire to smoke (cravings).  Help to stop the problems that come when you stop smoking (withdrawal symptoms). Your doctor may ask you to use:  Nicotine patches, gum, or lozenges.  Nicotine inhalers or sprays.  Non-nicotine medicine that is taken by mouth. Find resources Find resources and other ways to help you quit smoking and remain smoke-free after you quit. These resources are most helpful when you use them often. They include:  Online chats with a Social worker.  Phone quitlines.  Printed Furniture conservator/restorer.  Support groups or group counseling.  Text messaging programs.  Mobile phone apps. Use apps on your mobile phone or tablet that can help you stick to your quit plan. There are many free apps for mobile phones and tablets as well as websites. Examples include Quit Guide from the [employer] and smokefree.gov  What things can I do to make it easier to quit?   Talk to your family and friends. Ask them to support and encourage you.  Call a phone quitline (1-800-QUIT-NOW), reach out to support groups, or work with a Social worker.  Ask people who smoke to not smoke around you.  Avoid places that make you want to smoke, such as: ? Bars. ? Parties. ? Smoke-break areas at work.  Spend time with people who do not smoke.  Lower the stress in your life. Stress can make you want to smoke. Try these things to help your stress: ? Getting regular exercise. ? Doing deep-breathing exercises. ? Doing yoga. ? Meditating. ? Doing a body scan. To do this, close your eyes, focus on one area of your body at a time from head to toe. Notice which parts of your body are tense. Try to relax the muscles in those areas. How will I feel when I quit smoking? Day 1 to 3 weeks Within the first 24 hours, you may start to have some problems that come from quitting tobacco. These problems are very bad 2-3 days  after you quit, but they do not often last for more than 2-3 weeks. You may get these symptoms:  Mood swings.  Feeling restless, nervous, angry, or annoyed.  Trouble concentrating.  Dizziness.  Strong desire for high-sugar foods and nicotine.  Weight gain.  Trouble pooping (constipation).  Feeling like you may vomit (nausea).  Coughing or a sore throat.  Changes in how the medicines that you take for other issues work in your body.  Depression.  Trouble sleeping (insomnia). Week 3 and afterward After the first 2-3 weeks of quitting, you may start to notice more positive results, such as:  Better sense of smell and taste.  Less coughing and sore throat.  Slower heart rate.  Lower blood pressure.  Clearer skin.  Better breathing.  Fewer sick days. Quitting smoking can be hard. Do not give up if you fail the first time. Some people need to try a few times before they succeed. Do your best to stick to your quit plan, and talk with your doctor if you have any questions or concerns. Summary  Smoking tobacco is the leading cause of preventable death. Quitting smoking can be hard, but it is one of the best things that you can do for your health.  When you decide to quit smoking, make a plan to help you succeed.  Quit smoking right away, not slowly over a period of time.  When you start quitting, seek help from your doctor, family, or friends. This information is not intended to replace advice given to you by your health care provider. Make sure you discuss any questions you have with your health care provider. Document Revised: 11/29/2018 Document Reviewed: 05/25/2018 Elsevier Patient Education  [place].

## 2019-07-25 ENCOUNTER — Other Ambulatory Visit: Payer: Self-pay | Admitting: Family Medicine

## 2019-07-25 NOTE — Progress Notes (Signed)
I have collaborated with the care management provider regarding care management and care coordination activities outlined in this encounter and have reviewed this encounter including documentation in the note and care plan. I am certifying that I agree with the content of this note and encounter as supervising physician.   Vic Blackbird MD

## 2019-08-04 NOTE — Progress Notes (Deleted)
Cardiology Office Note  Date: 08/04/2019   ID: Bama, Zeoli [birth year], MRN [id number]  PCP:  Alycia Rossetti, MD  Cardiologist:  Carlyle Dolly, MD Electrophysiologist:  None   Chief Complaint: Hypertension, palpitations, syncope  History of Present Illness: Paula Long is a 69 y.o. female with a history of hypertension, palpitations, syncope.  Last saw Dr. Harl Bowie 06/06/2019. Patient had experienced a recent syncopal episode history concerning for possible cardiogenic syncope on 2/29/2021.  No prodromal symptoms.  Patient impacted her face with severe bruising and chest pain.  Recent non telemetry monitor showed evidence of atrial flutter. Recent carotid artery duplex demonstrated bilateral ICA stenosis 50-69%.   She was being treated for HTN with ACEI and subsequent ARB. Creatinine was elevated on ACEI and ARB therapy. These medications were subsequently stopped d/t worsening renal function. Renal artery ultrasound  showed 1 to 59% stenosis of right renal artery.  Left showed no evidence of left renal artery stenosis.  Mesenteric ultrasound showed 70 to 99% stenosis in the celiac artery and superior mesenteric arteries.    She called on July 03, 2019 with continued elevated blood pressure.  Metoprolol was stopped and she was placed on labetalol 200 mg p.o. twice daily. Before last around 9 AM that day she started having some chest pressure.  She rated the chest pressure 8 out of 10 in character.  Stated she has no radiation or associated nausea, vomiting, or diaphoresis. She stated "I just don't feel right".    An EKG was performed by nursing staff which showed a sinus bradycardia with a rate of 48. no acute ST or T wave changes noted. She continued to smoke 1 pack of cigarettes per day.  Past Medical History:  Diagnosis Date  . Anxiety   . Arthritis 12-07-11   arthritis,DDD,spinal stenosis  . Chronic back pain   . COPD (chronic obstructive pulmonary disease) ([place])  12-07-11   pt. uses nebulizer as needed and inhaler daily  . Fibromyalgia    skin cancer  . GERD (gastroesophageal reflux disease)   . Hemorrhoids   . Hypertension 12-07-11   presently no meds  . Normal cardiac stress test 06/2009  . Shortness of breath 12-07-11   less now, occ wheezes  . Skin cancer    melanoma.  face 2014    Past Surgical History:  Procedure Laterality Date  . ABDOMINAL HYSTERECTOMY    . BACK SURGERY  12-07-11   2'10 L5 x2  . COLONOSCOPY WITH PROPOFOL N/A 06/03/2014   Procedure: COLONOSCOPY WITH PROPOFOL; IN CECUM AT 0750; TOTAL WITHDRAWAL TIME 16 MINUTES;  Surgeon: Rogene Houston, MD;  Location: [facility];  Service: Endoscopy;  Laterality: N/A;  . left ankle surgery     tendonitis  . LUMBAR LAMINECTOMY/DECOMPRESSION MICRODISCECTOMY  12/11/2011   Procedure: LUMBAR LAMINECTOMY/DECOMPRESSION MICRODISCECTOMY;  Surgeon: Tobi Bastos, MD;  Location: [facility];  Service: Orthopedics;  Laterality: Left;  Decompressive Lumbar Laminectomy L5-S1 on Left  . POLYPECTOMY N/A 06/03/2014   Procedure: POLYPECTOMY;  Surgeon: Rogene Houston, MD;  Location: [facility];  Service: Endoscopy;  Laterality: N/A;  . right wrist surgery for pinched nerve    . trigger finger surgery  12-07-11   rt. middle trigger finger release    Current Outpatient Medications  Medication Sig Dispense Refill  . albuterol (PROVENTIL) (2.5 MG/3ML) 0.083% nebulizer solution INHALE THE CONTENTS OF 1 VIAL VIA NEBULIZER EVERY 4 HOURS AS NEEDED FOR WHEEZING OR SHORTNESS OF BREATH 90  mL 1  . albuterol (VENTOLIN HFA) 108 (90 Base) MCG/ACT inhaler INHALE 2 PUFFS EVERY 4 HOURS AS NEEDED FOR WHEEZING OR SHORTNESS OF BREATH 18 g 3  . amLODipine (NORVASC) 10 MG tablet Take 1 tablet (10 mg total) by mouth daily. 30 tablet 0  . apixaban (ELIQUIS) 5 MG TABS tablet Take 1 tablet (5 mg total) by mouth 2 (two) times daily. 14 tablet 0  . atorvastatin (LIPITOR) 10 MG tablet TAKE 1 TABLET BY MOUTH ONCE DAILY AT 6:00 PM. 90 tablet 3  .  Clobetasol Prop Emollient Base 0.05 % emollient cream Apply to affected areas BID (Patient not taking: Reported on 07/18/2019) 30 g 0  . desmopressin (DDAVP) 0.1 MG tablet TAKE 1 TABLET AT BEDTIME FOR BLADDER (Patient taking differently: Take 0.1 mg by mouth at bedtime. For bladder) 90 tablet 3  . dexlansoprazole (DEXILANT) 60 MG capsule Take 1 capsule (60 mg total) by mouth daily. (Patient taking differently: Take 1 capsule by mouth daily as needed. ) 90 capsule 3  . dextromethorphan-guaiFENesin (MUCINEX DM) 30-600 MG 12hr tablet Take 1 tablet by mouth 2 (two) times daily. (Patient not taking: Reported on 07/18/2019) 30 tablet 0  . EASYMAX TEST test strip USE TO TEST ONCE DAILY. 100 each 3  . furosemide (LASIX) 40 MG tablet Take 60 mg by mouth daily. Patient takes 1 and 1/2 tablet by mouth once daily. (60mg )    . gabapentin (NEURONTIN) 400 MG capsule Take 1 capsule (400 mg total) by mouth at bedtime. (Patient not taking: Reported on 07/24/2019) 90 capsule 3  . hydrALAZINE (APRESOLINE) 50 MG tablet Take 2 tablets (100 mg total) by mouth 3 (three) times daily. 540 tablet 3  . HYDROmorphone (DILAUDID) 2 MG tablet Take 1 mg by mouth 2 (two) times daily.     Marland Kitchen labetalol (NORMODYNE) 200 MG tablet Take 0.5 tablets (100 mg total) by mouth 2 (two) times daily. (Patient taking differently: Take 200 mg by mouth 2 (two) times daily. )    . nicotine (NICODERM CQ - DOSED IN MG/24 HOURS) 21 mg/24hr patch Place 1 patch (21 mg total) onto the skin daily. (Patient not taking: Reported on 07/24/2019) 30 patch 1  . nitroGLYCERIN (NITROSTAT) 0.4 MG SL tablet Place 1 tablet (0.4 mg total) under the tongue every 5 (five) minutes as needed for chest pain. 25 tablet 3  . nystatin (MYCOSTATIN/NYSTOP) powder APPLY  TOPICALLY TWICE DAILY AS NEEDED (Patient not taking: APPLY  TOPICALLY TWICE DAILY AS NEEDED) 60 g 2  . traZODone (DESYREL) 150 MG tablet TAKE 1 TABLET AT BEDTIME 90 tablet 3  . triamcinolone cream (KENALOG) 0.1 % Apply 1  application topically 2 (two) times daily. (Patient not taking: Reported on 07/18/2019) 30 g 0   No current facility-administered medications for this visit.   Allergies:  Hydrocodone-acetaminophen, Oxycodone-acetaminophen, Sulfa antibiotics, Nystatin, Percocet [oxycodone-acetaminophen], Vesicare [solifenacin], and Vicodin [hydrocodone-acetaminophen]   Social History: The patient  reports that she has been smoking cigarettes. She started smoking about 49 years ago. She has a 96.00 pack-year smoking history. She has never used smokeless tobacco. She reports that she does not drink alcohol or use drugs.   Family History: The patient's family history includes Cancer in her father and mother.   ROS:  Please see the history of present illness. Otherwise, complete review of systems is positive for none.  All other systems are reviewed and negative.   Physical Exam: VS:  There were no vitals taken for this visit., BMI There is  no height or weight on file to calculate BMI.  Wt Readings from Last 3 Encounters:  07/18/19 211 lb 9.6 oz (96 kg)  07/08/19 218 lb (98.9 kg)  07/01/19 217 lb (98.4 kg)    General: Patient appears comfortable at rest. Neck: Supple, no elevated JVP or carotid bruits, no thyromegaly. Lungs: Clear to auscultation, nonlabored breathing at rest. Cardiac: Regular rate and rhythm, no S3 or significant systolic murmur, no pericardial rub. Extremities: No pitting edema, distal pulses 2+. Skin: Warm and dry. Musculoskeletal: No kyphosis. Neuropsychiatric: Alert and oriented x3, affect grossly appropriate.  ECG:  An ECG dated 04/21/2019 was personally reviewed today and demonstrated:  Normal sinus rhythm rate of 65  Recent Labwork: 09/30/2018: TSH 1.571 04/18/2019: ALT 14; AST 16 07/18/2019: BUN 37; Creatinine, Ser 1.74; Hemoglobin 13.2; Platelets 270; Potassium 3.3; Sodium 135     Component Value Date/Time   CHOL 110 09/30/2018 0613   TRIG 37 09/30/2018 0613   HDL 49 09/30/2018  0613   CHOLHDL 2.2 09/30/2018 0613   VLDL 7 09/30/2018 0613   LDLCALC 54 09/30/2018 0613   LDLCALC 55 08/27/2018 1035    Other Studies Reviewed Today:  Nuclear stress test 07/23/2019 Study Result   There was no ST segment deviation noted during stress.  The study is normal. No ischemia or scar.  This is a low risk study.  Nuclear stress EF: 60%.       Echo 07/19/2019 1. Normal LV systolic function; moderate LVH; grade 1 diastolic dysfunction. 2. Left ventricular ejection fraction, by estimation, is 60 to 65%. The left ventricle has normal function. The left ventricle has no regional wall motion abnormalities. There is moderate left ventricular hypertrophy. Left ventricular diastolic parameters are consistent with Grade I diastolic dysfunction (impaired relaxation). Elevated left atrial pressure. 3. Right ventricular systolic function is normal. The right ventricular size is normal. 4. The mitral valve is normal in structure. Trivial mitral valve regurgitation. No evidence of mitral stenosis. 5. The aortic valve is tricuspid. Aortic valve regurgitation is not visualized. Mild aortic valve sclerosis is present, with no evidence of aortic valve stenosis. 6. The inferior vena cava is normal in size with greater than 50% respiratory variability, suggesting right atrial pressure of 3 mmHg.  Recent non telemetry monitor  Report Analysis: Atrial Flutter with Variable Conduction w/Artifact  Carotid artery duplex 05/02/2019 IMPRESSION: 1. Bilateral carotid bifurcation and proximal ICA plaque resulting in 50-69% diameter stenosis. Little change from prior study. 2. Origin stenoses of bilateral external carotid arteries. 3. Antegrade flow in bilateral vertebral arteries.  Renal artery [place] 04/29/2019 Summary: Renal: Mesenteric: 70 to 99% stenosis in the celiac artery and superior mesenteric artery. Right: 1-59% stenosis of the right renal artery. Abnormal right Resistive Index.  Normal size right kidney. Left: No evidence of left renal artery stenosis. Abnormal left Resisitve Index. Normal size of left kidney.  08/2017 echo Study Conclusions  - Left ventricle: The cavity size was normal. Wall thickness was normal. Systolic function was normal. The estimated ejection fraction was in the range of 55% to 60%. Wall motion was normal; there were no regional wall motion abnormalities. Left ventricular diastolic function parameters were normal. - Aortic valve: Mildly calcified annulus. Mildly thickened leaflets. Valve area (VTI): 2.58 cm^2. Valve area (Vmax): 2.54 cm^2. - Mitral valve: Mildly calcified annulus. Normal thickness leaflets . - Left atrium: The atrium was mildly dilated. - Technically adequate study.  Jan 2020 event monitor  14 day event monitor  Min HR 46, Max  HR 123, Avg HR 73. Min HR occurred in very early AM hours presumably while sleeping  No symptoms reported  Telemetry tracings show sinus rhythm  No significant arrhythmias   Assessment and Plan:  1. Essential HTN                                                                                                Patient having significant increases in blood pressure.  She had tried ACEs and ARBS in the past with decreased renal function.  She was started on hydralazine.  She called on July 03, 2019 with continued elevated blood pressures.  Her metoprolol was stopped and she was started on labetalol 200 mg p.o. twice daily by Dr. Harl Bowie.  2. Syncope, unspecified syncope type Patient denies any subsequent syncopal or near syncopal episodes since the previous event in January.  We will continue to monitor.   3. Atrial flutter, unspecified type ([place]) Recent non-telemetry monitor showed evidence of atrial flutter.  start Eliquis 5 mg p.o. twice daily.  EKG today shows sinus bradycardia with a rate of 48.  No evidence of atrial fibrillation or flutter.  She denies any evidence of  bleeding in stool or urine. Continue Labetolol 200 mg po bid  4. Bilateral carotid artery stenosis She has bilateral carotid artery stenosis on recent carotid artery study of 50 to 69% unchanged from previous study.  Reinforced risk factor management such as smoking.  We will continue to monitor with further carotid artery studies. 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  Patient denies any recent weight loss, postprandial pain, or diarrhea.  We will manage medically with risk factor control blood pressure control as well as statin therapy.  She is followed by vascular.  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  Patient denies any recent weight loss, postprandial pain, or diarrhea.  We will manage medically with risk factor control blood pressure control as well as statin therapy.  She is followed by vascular.   7. Right renal artery stenosis ([facility]) She has right renal artery stenosis found on recent ultrasound of 1 to 59% stenosis. No evidence of left renal artery stenosis.  This likely accounts for sudden increase in blood pressures and decreased renal function after attempting ACE and ARB therapy in the past.  We will continue with medical management for hypertension and risk factor management.  She is followed by vascular  8.  Smoking Patient states she knows she needs to stop smoking and has been told by vascular physician this is a big contributor to her vascular disease.  Patient continues to smoke 1 pack/day of cigarettes stating "you did not expect me to stop did you?".  Advised her she needs to stop.  9. Chest pressure At last visit she had chest pressure the morning before visit .  She rated the pressure/pain 8 out of 10.    Started nitroglycerin sublingual prn. Advised patient on how to take the SL  NTG.  Recent MPI showed no s/o ischemia and was a low risk study   10.  Dyspnea on exertion. Complained of increased dyspnea on exertion associated with a recent onset of chest pressure with an audible systolic ejection murmur. Echo was performed with EF 60-65%. Mod LVH, Grade 1 DD.  Medication Adjustments/Labs and Tests Ordered: Current medicines are reviewed at length with the patient today.  Concerns regarding medicines are outlined above.   Disposition: Follow-up with Dr Harl Bowie or APP  Signed, Levell July, NP 08/04/2019 9:32 PM    [facility] [place], [place], [place] [address] Phone: [phone]; Fax: [phone]

## 2019-08-05 ENCOUNTER — Encounter: Payer: Medicare HMO | Admitting: Family Medicine

## 2019-08-05 ENCOUNTER — Emergency Department (HOSPITAL_COMMUNITY): Payer: Medicare HMO

## 2019-08-05 ENCOUNTER — Emergency Department (HOSPITAL_COMMUNITY)
Admission: EM | Admit: 2019-08-05 | Discharge: 2019-08-05 | Disposition: A | Discharge status: Home / Self Care | Payer: Medicare HMO | Attending: Emergency Medicine | Admitting: Emergency Medicine

## 2019-08-05 ENCOUNTER — Other Ambulatory Visit: Payer: Self-pay

## 2019-08-05 ENCOUNTER — Encounter: Payer: Self-pay | Admitting: Family Medicine

## 2019-08-05 ENCOUNTER — Encounter (HOSPITAL_COMMUNITY): Payer: Self-pay

## 2019-08-05 DIAGNOSIS — N183 Chronic kidney disease, stage 3 unspecified: Secondary | ICD-10-CM | POA: Diagnosis not present

## 2019-08-05 DIAGNOSIS — Z79899 Other long term (current) drug therapy: Secondary | ICD-10-CM | POA: Insufficient documentation

## 2019-08-05 DIAGNOSIS — R109 Unspecified abdominal pain: Secondary | ICD-10-CM | POA: Diagnosis not present

## 2019-08-05 DIAGNOSIS — E1122 Type 2 diabetes mellitus with diabetic chronic kidney disease: Secondary | ICD-10-CM | POA: Diagnosis not present

## 2019-08-05 DIAGNOSIS — Z7901 Long term (current) use of anticoagulants: Secondary | ICD-10-CM | POA: Diagnosis not present

## 2019-08-05 DIAGNOSIS — I129 Hypertensive chronic kidney disease with stage 1 through stage 4 chronic kidney disease, or unspecified chronic kidney disease: Secondary | ICD-10-CM | POA: Diagnosis not present

## 2019-08-05 DIAGNOSIS — F17210 Nicotine dependence, cigarettes, uncomplicated: Secondary | ICD-10-CM | POA: Diagnosis not present

## 2019-08-05 DIAGNOSIS — K5900 Constipation, unspecified: Secondary | ICD-10-CM | POA: Insufficient documentation

## 2019-08-05 LAB — CBC WITH DIFFERENTIAL/PLATELET
Abs Immature Granulocytes: 0.04 10*3/uL (ref 0.00–0.07)
Basophils Absolute: 0.1 10*3/uL (ref 0.0–0.1)
Basophils Relative: 1 %
Eosinophils Absolute: 0.1 10*3/uL (ref 0.0–0.5)
Eosinophils Relative: 1 %
HCT: 39.1 % (ref 36.0–46.0)
Hemoglobin: 12.7 g/dL (ref 12.0–15.0)
Immature Granulocytes: 1 %
Lymphocytes Relative: 14 %
Lymphs Abs: 1.1 10*3/uL (ref 0.7–4.0)
MCH: 30.9 pg (ref 26.0–34.0)
MCHC: 32.5 g/dL (ref 30.0–36.0)
MCV: 95.1 fL (ref 80.0–100.0)
Monocytes Absolute: 0.6 10*3/uL (ref 0.1–1.0)
Monocytes Relative: 7 %
Neutro Abs: 5.7 10*3/uL (ref 1.7–7.7)
Neutrophils Relative %: 76 %
Platelets: 298 10*3/uL (ref 150–400)
RBC: 4.11 MIL/uL (ref 3.87–5.11)
RDW: 15.1 % (ref 11.5–15.5)
WBC: 7.5 10*3/uL (ref 4.0–10.5)
nRBC: 0 % (ref 0.0–0.2)

## 2019-08-05 LAB — COMPREHENSIVE METABOLIC PANEL
ALT: 11 U/L (ref 0–44)
AST: 13 U/L — ABNORMAL LOW (ref 15–41)
Albumin: 3.6 g/dL (ref 3.5–5.0)
Alkaline Phosphatase: 64 U/L (ref 38–126)
Anion gap: 13 (ref 5–15)
BUN: 22 mg/dL (ref 8–23)
CO2: 25 mmol/L (ref 22–32)
Calcium: 9.1 mg/dL (ref 8.9–10.3)
Chloride: 99 mmol/L (ref 98–111)
Creatinine, Ser: 1.67 mg/dL — ABNORMAL HIGH (ref 0.44–1.00)
GFR calc Af Amer: 36 mL/min — ABNORMAL LOW (ref >60)
GFR calc non Af Amer: 31 mL/min — ABNORMAL LOW (ref >60)
Glucose, Bld: 115 mg/dL — ABNORMAL HIGH (ref 70–99)
Potassium: 4.1 mmol/L (ref 3.5–5.1)
Sodium: 137 mmol/L (ref 135–145)
Total Bilirubin: 0.4 mg/dL (ref 0.3–1.2)
Total Protein: 6.9 g/dL (ref 6.5–8.1)

## 2019-08-05 LAB — URINALYSIS, ROUTINE W REFLEX MICROSCOPIC
Bilirubin Urine: NEGATIVE
Glucose, UA: NEGATIVE mg/dL
Hgb urine dipstick: NEGATIVE
Ketones, ur: NEGATIVE mg/dL
Leukocytes,Ua: NEGATIVE
Nitrite: NEGATIVE
Protein, ur: NEGATIVE mg/dL
Specific Gravity, Urine: 1.035 — ABNORMAL HIGH (ref 1.005–1.030)
pH: 6 (ref 5.0–8.0)

## 2019-08-05 LAB — LIPASE, BLOOD: Lipase: 18 U/L (ref 11–51)

## 2019-08-05 MED ORDER — METAMUCIL 0.52 G PO CAPS: 0.5200 g | ORAL_CAPSULE | Freq: Every day | ORAL | 2 refills | Status: DC

## 2019-08-05 MED ORDER — IOHEXOL 300 MG/ML  SOLN
75.0000 mL | Freq: Once | INTRAMUSCULAR | Status: AC | PRN
  Administered 2019-08-05: 75 mL via INTRAVENOUS

## 2019-08-05 MED ORDER — SODIUM CHLORIDE 0.9 % IV BOLUS
500.0000 mL | Freq: Once | INTRAVENOUS | Status: AC
  Administered 2019-08-05: 500 mL via INTRAVENOUS

## 2019-08-05 NOTE — Discharge Instructions (Addendum)
Follow up with dr.  Gala Romney gastroenterologist in the next couple weeks.  Drink plenty of fluids.

## 2019-08-05 NOTE — ED Provider Notes (Signed)
[facility] EMERGENCY DEPARTMENT Provider Note   CSN: [id number] Arrival date & time: 08/05/19  1114     History Chief Complaint  Patient presents with  . Abdominal Pain    Paula Long is a 69 y.o. female.  Patient complains of lower abdominal pain and constipation  The history is provided by the patient. No language interpreter was used.  Abdominal Pain Pain location:  Suprapubic Pain quality: aching   Pain radiates to:  Does not radiate Pain severity:  Mild Onset quality:  Gradual Timing:  Constant Progression:  Unchanged Chronicity:  Recurrent Context: not alcohol use   Relieved by:  Nothing Worsened by:  Nothing Ineffective treatments:  None tried Associated symptoms: constipation   Associated symptoms: no anorexia, no chest pain, no cough, no diarrhea, no fatigue and no hematuria        Past Medical History:  Diagnosis Date  . Anxiety   . Arthritis 12-07-11   arthritis,DDD,spinal stenosis  . Chronic back pain   . COPD (chronic obstructive pulmonary disease) ([place]) 12-07-11   pt. uses nebulizer as needed and inhaler daily  . Fibromyalgia    skin cancer  . GERD (gastroesophageal reflux disease)   . Hemorrhoids   . Hypertension 12-07-11   presently no meds  . Normal cardiac stress test 06/2009  . Shortness of breath 12-07-11   less now, occ wheezes  . Skin cancer    melanoma.  face 2014    Patient Active Problem List   Diagnosis Date Noted  . Chest pain 07/18/2019  . Hypertensive urgency 07/18/2019  . Paroxysmal atrial flutter ([place]) 07/18/2019  . Leg edema 03/25/2019  . Palpitations 03/25/2019  . Memory changes 12/13/2018  . B12 deficiency 12/13/2018  . Carotid artery disease ([place]) 10/08/2018  . Vision changes   . Weakness   . Diabetes mellitus with nephropathy ([place])   . CKD stage 3 secondary to diabetes ([place])   . Hyperlipidemia   . TIA (transient ischemic attack) 09/29/2018  . Peripheral edema 09/04/2018  . Depression with anxiety  05/22/2014  . Family hx of colon cancer 02/19/2014  . IBS (irritable bowel syndrome) 01/23/2014  . Urinary incontinence, mixed 10/13/2013  . Chronic insomnia 09/12/2013  . Boil 02/27/2013  . Skin lesion of face 10/22/2012  . Hemorrhoids 10/22/2012  . Intertrigo 06/21/2012  . Neck pain on left side 06/21/2012  . Bunion of great toe 06/21/2012  . OAB (overactive bladder) 03/15/2012  . Spinal stenosis, lumbar region, with neurogenic claudication 12/11/2011  . Essential hypertension 10/30/2011  . Chronic back pain 10/30/2011  . GERD (gastroesophageal reflux disease) 10/30/2011  . COPD (chronic obstructive pulmonary disease) ([place]) 10/30/2011  . Tobacco user 10/30/2011  . Obesity 10/30/2011  . Abnormal mammogram 10/30/2011    Past Surgical History:  Procedure Laterality Date  . ABDOMINAL HYSTERECTOMY    . BACK SURGERY  12-07-11   2'10 L5 x2  . COLONOSCOPY WITH PROPOFOL N/A 06/03/2014   Procedure: COLONOSCOPY WITH PROPOFOL; IN CECUM AT 0750; TOTAL WITHDRAWAL TIME 16 MINUTES;  Surgeon: Rogene Houston, MD;  Location: [facility] ORS;  Service: Endoscopy;  Laterality: N/A;  . left ankle surgery     tendonitis  . LUMBAR LAMINECTOMY/DECOMPRESSION MICRODISCECTOMY  12/11/2011   Procedure: LUMBAR LAMINECTOMY/DECOMPRESSION MICRODISCECTOMY;  Surgeon: Tobi Bastos, MD;  Location: [facility] ORS;  Service: Orthopedics;  Laterality: Left;  Decompressive Lumbar Laminectomy L5-S1 on Left  . POLYPECTOMY N/A 06/03/2014   Procedure: POLYPECTOMY;  Surgeon: Rogene Houston, MD;  Location: [facility]  ORS;  Service: Endoscopy;  Laterality: N/A;  . right wrist surgery for pinched nerve    . trigger finger surgery  12-07-11   rt. middle trigger finger release     OB History    Gravida  3   Para  2   Term  2   Preterm      AB  1   Living        SAB  1   TAB      Ectopic      Multiple      Live Births              Family History  Problem Relation Age of Onset  . Cancer Mother        uterine  . Cancer  Father        lung    Social History   Tobacco Use  . Smoking status: Current Every Day Smoker    Packs/day: 2.00    Years: 48.00    Pack years: 96.00    Types: Cigarettes    Start date: 01/07/1970  . Smokeless tobacco: Never Used  . Tobacco comment: since age 15. smoking 2-3 packs a day  Substance Use Topics  . Alcohol use: No    Alcohol/week: 0.0 standard drinks  . Drug use: No    Home Medications Prior to Admission medications   Medication Sig Start Date End Date Taking? Authorizing Provider  amLODipine (NORVASC) 10 MG tablet Take 1 tablet (10 mg total) by mouth daily. 07/20/19  Yes Kathie Dike, MD  apixaban (ELIQUIS) 5 MG TABS tablet Take 1 tablet (5 mg total) by mouth 2 (two) times daily. 07/01/19  Yes Verta Ellen., NP  atorvastatin (LIPITOR) 10 MG tablet TAKE 1 TABLET BY MOUTH ONCE DAILY AT 6:00 PM. Patient taking differently: Take 10 mg by mouth daily. TAKE 1 TABLET BY MOUTH ONCE DAILY AT 6:00 PM. 05/01/19  Yes [place], Modena Nunnery, MD  desmopressin (DDAVP) 0.1 MG tablet TAKE 1 TABLET AT BEDTIME FOR BLADDER Patient taking differently: Take 0.1 mg by mouth at bedtime. For bladder 11/13/18  Yes [place], Modena Nunnery, MD  furosemide (LASIX) 40 MG tablet Take 60 mg by mouth daily. Patient takes 1 and 1/2 tablet by mouth once daily. (60mg )   Yes [provider]  HYDROmorphone (DILAUDID) 2 MG tablet Take 1 mg by mouth 2 (two) times daily.    Yes [provider]  nitroGLYCERIN (NITROSTAT) 0.4 MG SL tablet Place 1 tablet (0.4 mg total) under the tongue every 5 (five) minutes as needed for chest pain. 07/08/19 10/06/19 Yes Verta Ellen., NP  traZODone (DESYREL) 150 MG tablet TAKE 1 TABLET AT BEDTIME 06/23/19  Yes [place], Modena Nunnery, MD  albuterol (PROVENTIL) (2.5 MG/3ML) 0.083% nebulizer solution INHALE THE CONTENTS OF 1 VIAL VIA NEBULIZER EVERY 4 HOURS AS NEEDED FOR WHEEZING OR SHORTNESS OF BREATH Patient taking differently: Take 2.5 mg by nebulization every 6  (six) hours as needed.  09/18/16   [place], Modena Nunnery, MD  albuterol (VENTOLIN HFA) 108 (90 Base) MCG/ACT inhaler INHALE 2 PUFFS EVERY 4 HOURS AS NEEDED FOR WHEEZING OR SHORTNESS OF BREATH Patient taking differently: Inhale 2 puffs into the lungs every 4 (four) hours as needed for wheezing or shortness of breath.  07/25/19   Alycia Rossetti, MD  Clobetasol Prop Emollient Base 0.05 % emollient cream Apply to affected areas BID Patient not taking: Reported on 08/05/2019 01/31/19   Alycia Rossetti,  MD  dexlansoprazole (DEXILANT) 60 MG capsule Take 1 capsule (60 mg total) by mouth daily. Patient not taking: Reported on 08/05/2019 05/01/19   Alycia Rossetti, MD  dextromethorphan-guaiFENesin [facility] DM) 30-600 MG 12hr tablet Take 1 tablet by mouth 2 (two) times daily. Patient not taking: Reported on 08/05/2019 07/16/19   Alycia Rossetti, MD  [facility] TEST test strip USE TO TEST ONCE DAILY. Patient not taking: Reported on 08/05/2019 06/03/15   Susy Frizzle, MD  gabapentin (NEURONTIN) 400 MG capsule Take 1 capsule (400 mg total) by mouth at bedtime. Patient not taking: Reported on 08/05/2019 04/18/19   Alycia Rossetti, MD  hydrALAZINE (APRESOLINE) 50 MG tablet Take 2 tablets (100 mg total) by mouth 3 (three) times daily. 06/30/19   Arnoldo Lenis, MD  labetalol (NORMODYNE) 200 MG tablet Take 0.5 tablets (100 mg total) by mouth 2 (two) times daily. 07/19/19   Kathie Dike, MD  nicotine (NICODERM CQ - DOSED IN MG/24 HOURS) 21 mg/24hr patch Place 1 patch (21 mg total) onto the skin daily. Patient not taking: Reported on 08/05/2019 07/19/19 07/18/20  Kathie Dike, MD  nystatin (MYCOSTATIN/NYSTOP) powder APPLY  TOPICALLY TWICE DAILY AS NEEDED Patient taking differently: Apply 1 application topically 2 (two) times daily as needed (rash). APPLY  TOPICALLY TWICE DAILY AS NEEDED 07/29/18   Alycia Rossetti, MD  psyllium (METAMUCIL) 0.52 g capsule Take 1 capsule (0.52 g total) by mouth daily. 08/05/19   Milton Ferguson, MD  triamcinolone cream (KENALOG) 0.1 % Apply 1 application topically 2 (two) times daily. Patient not taking: Reported on 08/05/2019 05/20/19   Alycia Rossetti, MD    Allergies    Hydrocodone-acetaminophen, Oxycodone-acetaminophen, Sulfa antibiotics, Nystatin, Percocet [oxycodone-acetaminophen], Vesicare [solifenacin], and Vicodin [hydrocodone-acetaminophen]  Review of Systems   Review of Systems  Constitutional: Negative for appetite change and fatigue.  HENT: Negative for congestion, ear discharge and sinus pressure.   Eyes: Negative for discharge.  Respiratory: Negative for cough.   Cardiovascular: Negative for chest pain.  Gastrointestinal: Positive for abdominal pain and constipation. Negative for anorexia and diarrhea.  Genitourinary: Negative for frequency and hematuria.  Musculoskeletal: Negative for back pain.  Skin: Negative for rash.  Neurological: Negative for seizures and headaches.  Psychiatric/Behavioral: Negative for hallucinations.    Physical Exam Updated Vital Signs BP (!) 150/53   Pulse 71   Temp 97.9 F (36.6 C) (Oral)   Resp 16   Ht 5\' 5"  (1.651 m)   Wt 95 kg   SpO2 94%   BMI 34.85 kg/m   Physical Exam Vitals and nursing note reviewed.  Constitutional:      Appearance: She is well-developed.  HENT:     Head: Normocephalic.     Mouth/Throat:     Mouth: Mucous membranes are moist.  Eyes:     General: No scleral icterus.    Conjunctiva/sclera: Conjunctivae normal.  Neck:     Thyroid: No thyromegaly.  Cardiovascular:     Rate and Rhythm: Normal rate and regular rhythm.     Heart sounds: No murmur. No friction rub. No gallop.   Pulmonary:     Breath sounds: No stridor. No wheezing or rales.  Chest:     Chest wall: No tenderness.  Abdominal:     General: There is no distension.     Tenderness: There is no abdominal tenderness. There is no rebound.  Genitourinary:    Comments: Rectal shows no stool in vault Musculoskeletal:  General: Normal range of motion.     Cervical back: Neck supple.  Lymphadenopathy:     Cervical: No cervical adenopathy.  Skin:    General: Skin is warm.     Findings: No erythema or rash.  Neurological:     Mental Status: She is alert and oriented to person, place, and time.     Motor: No abnormal muscle tone.     Coordination: Coordination normal.  Psychiatric:        Behavior: Behavior normal.     ED Results / Procedures / Treatments   Labs (all labs ordered are listed, but only abnormal results are displayed) Labs Reviewed  COMPREHENSIVE METABOLIC PANEL - Abnormal; Notable for the following components:      Result Value   Glucose, Bld 115 (*)    Creatinine, Ser 1.67 (*)    AST 13 (*)    GFR calc non Af Amer 31 (*)    GFR calc Af Amer 36 (*)    All other components within normal limits  URINALYSIS, ROUTINE W REFLEX MICROSCOPIC - Abnormal; Notable for the following components:   Specific Gravity, Urine 1.035 (*)    All other components within normal limits  CBC WITH DIFFERENTIAL/PLATELET  LIPASE, BLOOD    EKG None  Radiology CT ABDOMEN PELVIS W CONTRAST  Result Date: 08/05/2019 CLINICAL DATA:  Acute abdominal pain, neutropenia EXAM: CT ABDOMEN AND PELVIS WITH CONTRAST TECHNIQUE: Multidetector CT imaging of the abdomen and pelvis was performed using the standard protocol following bolus administration of intravenous contrast. CONTRAST:  36mL OMNIPAQUE IOHEXOL 300 MG/ML  SOLN COMPARISON:  None. FINDINGS: Lower chest: Mild emphysematous changes are noted. Hepatobiliary: Liver is mildly fatty infiltrated. The gallbladder is decompressed. Pancreas: Unremarkable. No pancreatic ductal dilatation or surrounding inflammatory changes. Spleen: Normal in size without focal abnormality. Adrenals/Urinary Tract: Adrenal glands are within normal limits. Kidneys demonstrate bilateral renal cysts. Dominant cyst is noted on the left measuring 2.2 cm in greatest dimension. No renal calculi or  obstructive changes are seen. The ureters are within normal limits bilaterally. The bladder is partially distended. Stomach/Bowel: Colon is predominately decompressed. No obstructive changes are noted. Appendix is not well visualized. Correlate with clinical history. The small bowel and stomach are unremarkable. Vascular/Lymphatic: Aortic atherosclerosis. No enlarged abdominal or pelvic lymph nodes. Reproductive: Status post hysterectomy. No adnexal masses. Other: No abdominal wall hernia or abnormality. No abdominopelvic ascites. Musculoskeletal: No acute or significant osseous findings. IMPRESSION: Bilateral renal cystic change. Fatty liver. Mild emphysema. No acute abnormality is noted. Electronically Signed   By: Inez Catalina M.D.   On: 08/05/2019 12:53    Procedures Procedures (including critical care time)  Medications Ordered in ED Medications  sodium chloride 0.9 % bolus 500 mL (0 mLs Intravenous Stopped 08/05/19 1334)  iohexol (OMNIPAQUE) 300 MG/ML solution 75 mL (75 mLs Intravenous Contrast Given 08/05/19 1223)    ED Course  I have reviewed the triage vital signs and the nursing notes.  Pertinent labs & imaging results that were available during my care of the patient were reviewed by me and considered in my medical decision making (see chart for details).    MDM Rules/Calculators/A&P                      Patient with obstipation.  CT scan negative.  Rectal exam shows a small amount of stool in vault.  Patient will be placed on Metamucil and referred to GI     This patient presents  to the ED for concern of constipation this involves an extensive number of treatment options, and is a complaint that carries with it a high risk of complications and morbidity.  The differential diagnosis includes constipation, colon cancer, diverticulosis   Lab Tests:   I Ordered, reviewed, and interpreted labs, which included CBC and chemistries.  Creatinine mildly elevated  Medicines  ordered:    I ordered medication saline for dehydration  Imaging Studies ordered:   I ordered imaging studies which included CT of the abdomen and  I independently visualized and interpreted imaging which showed unremarkable  Additional history obtained:   Additional history obtained from records  Previous records obtained and reviewed  Consultations Obtained:   Reevaluation:  After the interventions stated above, I reevaluated the patient and found unchanged  Critical Interventions:  .   Final Clinical Impression(s) / ED Diagnoses Final diagnoses:  Obstipation    Rx / DC Orders ED Discharge Orders         Ordered    psyllium (METAMUCIL) 0.52 g capsule  Daily     08/05/19 1456           Milton Ferguson, MD 08/06/19 1734

## 2019-08-05 NOTE — ED Triage Notes (Signed)
Pt reports constipation x 3 days.  Has been taking dulcolax without relief.  C/O abd pain and rectal pain.  Reports nausea.

## 2019-08-05 NOTE — ED Notes (Signed)
Patient ambulated to the bathroom.

## 2019-08-06 ENCOUNTER — Telehealth: Payer: Self-pay

## 2019-08-06 ENCOUNTER — Telehealth: Payer: Self-pay | Admitting: *Deleted

## 2019-08-06 NOTE — Telephone Encounter (Signed)
She is a Dr.Rehman pt.

## 2019-08-06 NOTE — Telephone Encounter (Signed)
Pt is aware.  

## 2019-08-06 NOTE — Telephone Encounter (Signed)
Nothing found on the CT scan for her abdominal pain that was urgent, and no sign of infection, so they wont see her earlier  She can start miralax 1 cap full twice a day  The metamucil is extra fiber which is fine to take  she can also add dulcolax suppostiry OTC  She can always schedule OV if miralax does get bowels moving,

## 2019-08-06 NOTE — Telephone Encounter (Signed)
Received call from patient.   Reports that she was seen in ER for severe constipation and abdominal pain. States that she was given orders for Metamucil.   States that referral was placed to Dr. Sydell Axon, but since she had seen Dr. Laural Golden in the past, the referral was cancelled.   States that Dr. Olevia Perches office cannot get her scheduled until August. States that she feels that she cannot wait that long as pain is unbearable.   MD please advise.

## 2019-08-06 NOTE — Telephone Encounter (Signed)
Call placed to patient. LMTRC.  

## 2019-08-06 NOTE — Telephone Encounter (Signed)
Seen in the ER and requested to follow up with [place]. Can we accept her as a new patient?

## 2019-08-06 NOTE — Progress Notes (Addendum)
THIS WAS A TELEMEDICINE PHONE VISIT  Cardiology Office Note  Date: 08/07/2019   ID: Paula, Long [birth year], MRN [id number]  PCP:  Alycia Rossetti, MD  Cardiologist:  Carlyle Dolly, MD Electrophysiologist:  None   Chief Complaint: Hypertension, palpitations, syncope  History of Present Illness: Paula Long is a 69 y.o. female with a history of hypertension, palpitations, syncope.  Last saw Dr. Harl Bowie 06/06/2019. Patient had experienced a recent syncopal episode history concerning for possible cardiogenic syncope on 2/29/2021.  No prodromal symptoms.  Patient impacted her face with severe bruising and chest pain.  Recent non telemetry monitor showed evidence of atrial flutter. Recent carotid artery duplex demonstrated bilateral ICA stenosis 50-69%.   She was being treated for HTN with ACEI and subsequent ARB. Creatinine was elevated on ACEI and ARB therapy. These medications were subsequently stopped d/t worsening renal function. Renal artery ultrasound  showed 1 to 59% stenosis of right renal artery.  Left showed no evidence of left renal artery stenosis.  Mesenteric ultrasound showed 70 to 99% stenosis in the celiac artery and superior mesenteric arteries.    She called on July 03, 2019 with continued elevated blood pressure.  Metoprolol was stopped and she was placed on labetalol 200 mg p.o. twice daily. Before last visit around 9 AM that day she started having some chest pressure.  She rated the chest pressure 8 out of 10 in character.  Stated she had no radiation or associated nausea, vomiting, or diaphoresis. She stated "I just don't feel right".  Spoke with patient by phone via telemedicine today.  Patient states she is doing reasonably well.  Still having some issues with some shortness of breath.  States she recently had a emergency room visit for problems with her bowels mainly constipation.  I reviewed her stress test and echo cardiogram results with her.   Her blood pressure appears to be controlled since starting the labetalol.  Blood pressure was 108/72.  Past Medical History:  Diagnosis Date  . Anxiety   . Arthritis 12-07-11   arthritis,DDD,spinal stenosis  . Chronic back pain   . COPD (chronic obstructive pulmonary disease) ([place]) 12-07-11   pt. uses nebulizer as needed and inhaler daily  . Fibromyalgia    skin cancer  . GERD (gastroesophageal reflux disease)   . Hemorrhoids   . Hypertension 12-07-11   presently no meds  . Normal cardiac stress test 06/2009  . Shortness of breath 12-07-11   less now, occ wheezes  . Skin cancer    melanoma.  face 2014    Past Surgical History:  Procedure Laterality Date  . ABDOMINAL HYSTERECTOMY    . BACK SURGERY  12-07-11   2'10 L5 x2  . COLONOSCOPY WITH PROPOFOL N/A 06/03/2014   Procedure: COLONOSCOPY WITH PROPOFOL; IN CECUM AT 0750; TOTAL WITHDRAWAL TIME 16 MINUTES;  Surgeon: Rogene Houston, MD;  Location: [facility];  Service: Endoscopy;  Laterality: N/A;  . left ankle surgery     tendonitis  . LUMBAR LAMINECTOMY/DECOMPRESSION MICRODISCECTOMY  12/11/2011   Procedure: LUMBAR LAMINECTOMY/DECOMPRESSION MICRODISCECTOMY;  Surgeon: Tobi Bastos, MD;  Location: [facility];  Service: Orthopedics;  Laterality: Left;  Decompressive Lumbar Laminectomy L5-S1 on Left  . POLYPECTOMY N/A 06/03/2014   Procedure: POLYPECTOMY;  Surgeon: Rogene Houston, MD;  Location: [facility];  Service: Endoscopy;  Laterality: N/A;  . right wrist surgery for pinched nerve    . trigger finger surgery  12-07-11  rt. middle trigger finger release    Current Outpatient Medications  Medication Sig Dispense Refill  . albuterol (PROVENTIL) (2.5 MG/3ML) 0.083% nebulizer solution INHALE THE CONTENTS OF 1 VIAL VIA NEBULIZER EVERY 4 HOURS AS NEEDED FOR WHEEZING OR SHORTNESS OF BREATH (Patient taking differently: Take 2.5 mg by nebulization every 6 (six) hours as needed. ) 90 mL 1  . albuterol (VENTOLIN HFA) 108 (90 Base) MCG/ACT inhaler  INHALE 2 PUFFS EVERY 4 HOURS AS NEEDED FOR WHEEZING OR SHORTNESS OF BREATH (Patient taking differently: Inhale 2 puffs into the lungs every 4 (four) hours as needed for wheezing or shortness of breath. ) 18 g 3  . amLODipine (NORVASC) 10 MG tablet Take 1 tablet (10 mg total) by mouth daily. 30 tablet 0  . apixaban (ELIQUIS) 5 MG TABS tablet Take 1 tablet (5 mg total) by mouth 2 (two) times daily. 14 tablet 0  . atorvastatin (LIPITOR) 10 MG tablet TAKE 1 TABLET BY MOUTH ONCE DAILY AT 6:00 PM. (Patient taking differently: Take 10 mg by mouth daily. TAKE 1 TABLET BY MOUTH ONCE DAILY AT 6:00 PM.) 90 tablet 3  . Clobetasol Prop Emollient Base 0.05 % emollient cream Apply topically as needed.    . desmopressin (DDAVP) 0.1 MG tablet TAKE 1 TABLET AT BEDTIME FOR BLADDER (Patient taking differently: Take 0.1 mg by mouth at bedtime. For bladder) 90 tablet 3  . dexlansoprazole (DEXILANT) 60 MG capsule Take 1 capsule (60 mg total) by mouth daily. 90 capsule 3  . EASYMAX TEST test strip USE TO TEST ONCE DAILY. 100 each 3  . furosemide (LASIX) 40 MG tablet Take 60 mg by mouth daily. Patient takes 1 and 1/2 tablet by mouth once daily. (60mg )    . hydrALAZINE (APRESOLINE) 50 MG tablet Take 2 tablets (100 mg total) by mouth 3 (three) times daily. 540 tablet 3  . HYDROmorphone (DILAUDID) 2 MG tablet Take 1 mg by mouth 2 (two) times daily.     Marland Kitchen labetalol (NORMODYNE) 200 MG tablet Take 0.5 tablets (100 mg total) by mouth 2 (two) times daily.    . nitroGLYCERIN (NITROSTAT) 0.4 MG SL tablet Place 1 tablet (0.4 mg total) under the tongue every 5 (five) minutes as needed for chest pain. 25 tablet 3  . nystatin (MYCOSTATIN/NYSTOP) powder APPLY  TOPICALLY TWICE DAILY AS NEEDED (Patient taking differently: Apply 1 application topically 2 (two) times daily as needed (rash). APPLY  TOPICALLY TWICE DAILY AS NEEDED) 60 g 2  . psyllium (METAMUCIL) 0.52 g capsule Take 1 capsule (0.52 g total) by mouth daily. 30 capsule 2  .  traZODone (DESYREL) 150 MG tablet TAKE 1 TABLET AT BEDTIME 90 tablet 3   No current facility-administered medications for this visit.   Allergies:  Hydrocodone-acetaminophen, Oxycodone-acetaminophen, Sulfa antibiotics, Nystatin, Percocet [oxycodone-acetaminophen], Vesicare [solifenacin], and Vicodin [hydrocodone-acetaminophen]   Social History: The patient  reports that she has been smoking cigarettes. She started smoking about 49 years ago. She has a 96.00 pack-year smoking history. She has never used smokeless tobacco. She reports that she does not drink alcohol or use drugs.   Family History: The patient's family history includes Cancer in her father and mother.   ROS:  Please see the history of present illness. Otherwise, complete review of systems is positive for none.  All other systems are reviewed and negative.   Physical Exam: VS:  BP 108/72   Pulse 77   Ht 5\' 5"  (1.651 m)   Wt 211 lb (95.7 kg)  BMI 35.11 kg/m , BMI Body mass index is 35.11 kg/m.  Wt Readings from Last 3 Encounters:  08/07/19 211 lb (95.7 kg)  08/05/19 209 lb 7 oz (95 kg)  08/05/19 211 lb (95.7 kg)    Has normal speech pattern and responding appropriately to questions.  No evidence of cough, wheezing or dyspnea noted during phone call.    ECG:  An ECG dated 04/21/2019 was personally reviewed today and demonstrated:  Normal sinus rhythm rate of 65  Recent Labwork: 09/30/2018: TSH 1.571 08/05/2019: ALT 11; AST 13; BUN 22; Creatinine, Ser 1.67; Hemoglobin 12.7; Platelets 298; Potassium 4.1; Sodium 137     Component Value Date/Time   CHOL 110 09/30/2018 0613   TRIG 37 09/30/2018 0613   HDL 49 09/30/2018 0613   CHOLHDL 2.2 09/30/2018 0613   VLDL 7 09/30/2018 0613   LDLCALC 54 09/30/2018 0613   LDLCALC 55 08/27/2018 1035    Other Studies Reviewed Today:  Nuclear stress test 07/23/2019 Study Result   There was no ST segment deviation noted during stress.  The study is normal. No ischemia or scar.   This is a low risk study.  Nuclear stress EF: 60%.       Echo 07/19/2019 1. Normal LV systolic function; moderate LVH; grade 1 diastolic dysfunction. 2. Left ventricular ejection fraction, by estimation, is 60 to 65%. The left ventricle has normal function. The left ventricle has no regional wall motion abnormalities. There is moderate left ventricular hypertrophy. Left ventricular diastolic parameters are consistent with Grade I diastolic dysfunction (impaired relaxation). Elevated left atrial pressure. 3. Right ventricular systolic function is normal. The right ventricular size is normal. 4. The mitral valve is normal in structure. Trivial mitral valve regurgitation. No evidence of mitral stenosis. 5. The aortic valve is tricuspid. Aortic valve regurgitation is not visualized. Mild aortic valve sclerosis is present, with no evidence of aortic valve stenosis. 6. The inferior vena cava is normal in size with greater than 50% respiratory variability, suggesting right atrial pressure of 3 mmHg.  Recent non telemetry monitor  Report Analysis: Atrial Flutter with Variable Conduction w/Artifact  Carotid artery duplex 05/02/2019 IMPRESSION: 1. Bilateral carotid bifurcation and proximal ICA plaque resulting in 50-69% diameter stenosis. Little change from prior study. 2. Origin stenoses of bilateral external carotid arteries. 3. Antegrade flow in bilateral vertebral arteries.  Renal artery [place] 04/29/2019 Summary: Renal: Mesenteric: 70 to 99% stenosis in the celiac artery and superior mesenteric artery. Right: 1-59% stenosis of the right renal artery. Abnormal right Resistive Index. Normal size right kidney. Left: No evidence of left renal artery stenosis. Abnormal left Resisitve Index. Normal size of left kidney.  08/2017 echo Study Conclusions  - Left ventricle: The cavity size was normal. Wall thickness was normal. Systolic function was normal. The estimated ejection fraction  was in the range of 55% to 60%. Wall motion was normal; there were no regional wall motion abnormalities. Left ventricular diastolic function parameters were normal. - Aortic valve: Mildly calcified annulus. Mildly thickened leaflets. Valve area (VTI): 2.58 cm^2. Valve area (Vmax): 2.54 cm^2. - Mitral valve: Mildly calcified annulus. Normal thickness leaflets . - Left atrium: The atrium was mildly dilated. - Technically adequate study.  Jan 2020 event monitor  14 day event monitor  Min HR 46, Max HR 123, Avg HR 73. Min HR occurred in very early AM hours presumably while sleeping  No symptoms reported  Telemetry tracings show sinus rhythm  No significant arrhythmias   Assessment and Plan:  1. Essential HTN                                                                                                She called on July 03, 2019 with continued elevated blood pressures.  Her metoprolol was stopped and she was started on labetalol 200 mg p.o. twice daily by Dr. Harl Bowie.  Her blood pressure today is 108/72 since starting labetalol.  Continue labetalol 200 mg p.o. twice daily  2. Syncope, unspecified syncope type Patient denies any subsequent syncopal or near syncopal episodes since the previous event in January.  We will continue to monitor.   3. Atrial flutter, unspecified type ([place]) Recent non-telemetry monitor showed evidence of atrial flutter.  Continue  Eliquis 5 mg p.o. twice daily.   4. Bilateral carotid artery stenosis She has bilateral carotid artery stenosis on recent carotid artery study of 50 to 69% unchanged from previous study.  Reinforced risk factor management such as smoking.  We will continue to monitor with further carotid artery studies. 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  Patient denies any recent weight loss, postprandial pain, or diarrhea.  We  will manage medically with risk factor control blood pressure control as well as statin therapy.  She is followed by vascular.  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  Patient denies any recent weight loss, postprandial pain, or diarrhea.  We will manage medically with risk factor control blood pressure control as well as statin therapy.  She is followed by vascular.   7. Right renal artery stenosis ([facility]) She has right renal artery stenosis found on recent ultrasound of 1 to 59% stenosis. No evidence of left renal artery stenosis.  This likely accounts for sudden increase in blood pressures and decreased renal function after attempting ACE and ARB therapy in the past.  We will continue with medical management for hypertension and risk factor management.  She is followed by vascular  8.  Smoking Patient states she knows she needs to stop smoking and has been told by vascular physician this is a big contributor to her vascular disease.  Patient continues to smoke 1 pack/day of cigarettes.    9. Chest pressure At last visit she had chest pressure the morning before visit .  She rated the pressure/pain 8 out of 10. Recent MPI showed no s/o ischemia and was a low risk study result.  Discussed with patient today    10.  Dyspnea on exertion. Complained of increased dyspnea on exertion associated with a recent onset of chest pressure with an audible systolic ejection murmur. Echo was performed with EF 60-65%. Mod LVH, Grade 1 DD.  Results reviewed with patient today  Medication Adjustments/Labs and Tests Ordered: Current medicines are reviewed at length with the patient today.  Concerns regarding medicines are outlined above.   Disposition: Follow-up with Dr Harl Bowie or APP 6 months   TIME SPENT ON TELEPHONE 15 MINUTES WITH PATIENT  signed, Mitzi Hansen  Leonides Sake, NP 08/07/2019 9:28 AM    [facility]  [place], [place], [place] [address] Phone: [phone]; Fax: [phone]

## 2019-08-07 ENCOUNTER — Encounter: Payer: Self-pay | Admitting: Family Medicine

## 2019-08-07 ENCOUNTER — Telehealth (INDEPENDENT_AMBULATORY_CARE_PROVIDER_SITE_OTHER): Payer: Medicare HMO | Admitting: Family Medicine

## 2019-08-07 VITALS — BP 108/72 | HR 77 | Ht 65.0 in | Wt 211.0 lb

## 2019-08-07 DIAGNOSIS — R079 Chest pain, unspecified: Secondary | ICD-10-CM | POA: Diagnosis not present

## 2019-08-07 DIAGNOSIS — R55 Syncope and collapse: Secondary | ICD-10-CM | POA: Diagnosis not present

## 2019-08-07 DIAGNOSIS — I774 Celiac artery compression syndrome: Secondary | ICD-10-CM

## 2019-08-07 DIAGNOSIS — R0600 Dyspnea, unspecified: Secondary | ICD-10-CM

## 2019-08-07 DIAGNOSIS — I10 Essential (primary) hypertension: Secondary | ICD-10-CM

## 2019-08-07 DIAGNOSIS — I6523 Occlusion and stenosis of bilateral carotid arteries: Secondary | ICD-10-CM

## 2019-08-07 DIAGNOSIS — I771 Stricture of artery: Secondary | ICD-10-CM

## 2019-08-07 DIAGNOSIS — I4892 Unspecified atrial flutter: Secondary | ICD-10-CM

## 2019-08-07 DIAGNOSIS — K551 Chronic vascular disorders of intestine: Secondary | ICD-10-CM

## 2019-08-07 DIAGNOSIS — I701 Atherosclerosis of renal artery: Secondary | ICD-10-CM

## 2019-08-07 DIAGNOSIS — R0609 Other forms of dyspnea: Secondary | ICD-10-CM

## 2019-08-07 NOTE — Patient Instructions (Signed)
Medication Instructions:  Continue all current medications.   Labwork: none  Testing/Procedures: none  Follow-Up: 6 months   Any Other Special Instructions Will Be Listed Below (If Applicable).   If you need a refill on your cardiac medications before your next appointment, please call your pharmacy.  

## 2019-08-07 NOTE — Telephone Encounter (Signed)
Call placed to patient and patient made aware.  

## 2019-08-08 NOTE — Progress Notes (Signed)
I have added the diagnoses. Thank You

## 2019-08-13 ENCOUNTER — Ambulatory Visit (INDEPENDENT_AMBULATORY_CARE_PROVIDER_SITE_OTHER): Payer: Medicare HMO | Admitting: Family Medicine

## 2019-08-13 ENCOUNTER — Other Ambulatory Visit: Payer: Self-pay

## 2019-08-13 ENCOUNTER — Encounter: Payer: Self-pay | Admitting: Family Medicine

## 2019-08-13 VITALS — BP 138/68 | HR 78 | Temp 98.1°F | Resp 16 | Ht 65.0 in | Wt 212.8 lb

## 2019-08-13 DIAGNOSIS — K5904 Chronic idiopathic constipation: Secondary | ICD-10-CM | POA: Diagnosis not present

## 2019-08-13 DIAGNOSIS — Z0000 Encounter for general adult medical examination without abnormal findings: Secondary | ICD-10-CM

## 2019-08-13 DIAGNOSIS — N3281 Overactive bladder: Secondary | ICD-10-CM

## 2019-08-13 DIAGNOSIS — G629 Polyneuropathy, unspecified: Secondary | ICD-10-CM | POA: Insufficient documentation

## 2019-08-13 DIAGNOSIS — M8580 Other specified disorders of bone density and structure, unspecified site: Secondary | ICD-10-CM | POA: Diagnosis not present

## 2019-08-13 DIAGNOSIS — Z0001 Encounter for general adult medical examination with abnormal findings: Secondary | ICD-10-CM

## 2019-08-13 DIAGNOSIS — E1121 Type 2 diabetes mellitus with diabetic nephropathy: Secondary | ICD-10-CM | POA: Diagnosis not present

## 2019-08-13 DIAGNOSIS — J418 Mixed simple and mucopurulent chronic bronchitis: Secondary | ICD-10-CM

## 2019-08-13 DIAGNOSIS — G6289 Other specified polyneuropathies: Secondary | ICD-10-CM

## 2019-08-13 DIAGNOSIS — E1122 Type 2 diabetes mellitus with diabetic chronic kidney disease: Secondary | ICD-10-CM

## 2019-08-13 DIAGNOSIS — Z720 Tobacco use: Secondary | ICD-10-CM

## 2019-08-13 DIAGNOSIS — N183 Chronic kidney disease, stage 3 unspecified: Secondary | ICD-10-CM

## 2019-08-13 DIAGNOSIS — I10 Essential (primary) hypertension: Secondary | ICD-10-CM | POA: Diagnosis not present

## 2019-08-13 DIAGNOSIS — F418 Other specified anxiety disorders: Secondary | ICD-10-CM

## 2019-08-13 MED ORDER — LINACLOTIDE 145 MCG PO CAPS: 145.0000 ug | ORAL_CAPSULE | Freq: Every day | ORAL | 0 refills | Status: DC

## 2019-08-13 MED ORDER — LABETALOL HCL 200 MG PO TABS: 200.0000 mg | ORAL_TABLET | Freq: Two times a day (BID) | ORAL | Status: DC

## 2019-08-13 NOTE — Progress Notes (Signed)
Subjective:   Patient presents for Medicare Annual/Subsequent preventive examination.   Patient here for annual wellness visit.  At last visit she was seen in the emergency room secondary to difficulty having bowel movement.  CT scan showed benign cyst on the kidneys fatty liver emphysema changes but no obstruction in the bowels no infection in the bowels.  She was given Metamucil.  She called back and states she was still having difficulty with her bowel movements advised to take MiraLAX. She states she had not had BM in 1 week, she is on dilaudid from pain clinic now   Hypertension/atrial flutter she had a telehealth visit with her cardiologist last week.  She has very labile blood pressures plus chronic dyspnea on exertion He had recent echo in May which showed preserved ejection fraction 6065% but she did have grade 1 diastolic dysfunction. She is not sure if she is on labetalol or metorpolol, she is taking hydralazine and norvasc .  He is on Eliquis 5 mg twice a day daily arrhythmia.  At home BP  [id number] systolic  Stenosis of her celiac artery/superior mesenteric artery this was noted upon evaluation of her hypertension and concern for renal artery stenosis.  She was seen by vascular.  Since she not had the significant symptoms of weight loss postprandial pain or diarrhea it was advised that she just be managed with statin therapy.  COPD/Emphysema she is treated with a telehealth visit in April she is now back to her baseline she is taking her medications as prescribed, albuterol prn. She HAS CUT down to 1ppd, previously smoking 3PPD    Diabetes mellitus chronic kidney disease-controlled. Last A1C 5.7% in Jan  - Hyperlipidemia with PVD   OAB- maintains of DDVAP   Recent labs reviewed, Cr 1.67 BUN 115    Periphreal edema- takes lasix as needed   Review Past Medical/Family/Social: per EMR    Risk Factors  Current exercise habits: None  Dietary issues discussed: Yes  Cardiac risk  factors: Obesity (BMI >= 30 kg/m2). DM, Afib, HTN  Depression Screen  (Note: if answer to either of the following is "Yes", a more complete depression screening is indicated)  Over the past two weeks, have you felt down, depressed or hopeless? No Over the past two weeks, have you felt little interest or pleasure in doing things? No Have you lost interest or pleasure in daily life? No Do you often feel hopeless? No Do you cry easily over simple problems? No   Activities of Daily Living  In your present state of health, do you have any difficulty performing the following activities?:  Driving? No  Managing money? No  Feeding yourself? No  Getting from bed to chair? No  Climbing a flight of stairs? Yes  Preparing food and eating?: No  Bathing or showering? No  Getting dressed: No  Getting to the toilet? No  Using the toilet:No  Moving around from place to place: Yes  In the past year have you fallen or had a near fall?:No  Are you sexually active? No  Do you have more than one partner? No   Hearing Difficulties: No  Do you often ask people to speak up or repeat themselves? No  Do you experience ringing or noises in your ears? No Do you have difficulty understanding soft or whispered voices? No  Do you feel that you have a problem with memory? No Do you often misplace items? No  Do you feel safe at home? Yes  Cognitive Testing  Alert? Yes Normal Appearance?Yes  Oriented to person? Yes Place? Yes  Time? Yes  Recall of three objects? Yes  Can perform simple calculations? Yes  Displays appropriate judgment?Yes  Can read the correct time from a watch face?Yes   List the Names of Other Physician/Practitioners you currently use:  [facility] Cardiology Vascular surgery  Optometry- [facility]- [facility]    Screening Tests / Date Colonoscopy   UTD                  Zostavax  Declines  COVID-19- # 1 done today  PNA- UTD  Mammogram  UTD  Influenza Vaccine DECLINES   Tetanus/tdap Declines  Bone density done  2018 - Osteopenia, due for repeat   ROS:  GEN- denies fatigue, fever, weight loss,weakness, recent illness HEENT- denies eye drainage, change in vision, nasal discharge, CVS- denies chest pain, palpitations RESP- denies SOB, cough, wheeze ABD- denies N/V, change in stools, abd pain GU- denies dysuria, hematuria, dribbling, incontinence MSK- denies joint pain, muscle aches, injury Neuro- denies headache, dizziness, syncope, seizure activity   Physical: vitals reviewed  GEN- NAD, alert and oriented x3 HEENT- PERRL, EOMI, non injected sclera, pink conjunctiva, MMM, oropharynx clear Neck- Supple, no thryomegaly CVS- RRR, no murmur RESP-CTAB ABD-NABS,Soft,NT,ND Psych- normal affect and mood PHQ score 9  EXT- No edema Pulses- Radial, DP- 2+    Assessment:    Annual wellness medicare exam   Plan:    During the course of the visit the patient was educated and counseled about appropriate screening and preventive services including:       Immunizations- Declines shingles/TDAP      Fall/Cage screening Negative    Osteopenia- due for repeat bone density,PT to schedule, continue vitamin D   Abd pain- I think this is due to constipation, she wants to try a dulcolax, if that does not work, I have given her samples of linzess to use . Referral placed for GI      MDD/ with Anxiety-  Continue  , also on trazodone for chronic insomnia   DM- diet controlled  CKD- Recheck renal function  HTN- bp improved, she will verify her meds when we call with results, per notes beta blocker should be labetalol   OAB- continue DDVAP  Polyneuropathy/ Chronic Back pain - per pain clinic   COPD/Emphysema- she is trying to reduce her tobacco, prn albuterol  MDD- continue trazodone, pt declines other meds at this time  Discussed Advanced directives- handout given, currently FULL CODE    I have personally reviewed:  The patient's medical and  social history  Their use of alcohol, tobacco or illicit drugs  Their current medications and supplements  The patient's functional ability including ADLs,fall risks, home safety risks, cognitive, and hearing and visual impairment  Diet and physical activities  Evidence for depression or mood disorders  The patient's weight, height, BMI, and visual acuity have been recorded in the chart. I have made referrals, counseling, and provided education to the patient based on review of the above and I have provided the patient with a written personalized care plan for preventive services.

## 2019-08-13 NOTE — Patient Instructions (Addendum)
Schedule bone density  Take a laxative in the morning, if that doesn't work try Linzess   Referral to GI  F/U 4 months

## 2019-08-14 ENCOUNTER — Encounter (INDEPENDENT_AMBULATORY_CARE_PROVIDER_SITE_OTHER): Payer: Self-pay | Admitting: Gastroenterology

## 2019-08-14 LAB — BASIC METABOLIC PANEL
BUN/Creatinine Ratio: 15 (calc) (ref 6–22)
BUN: 23 mg/dL (ref 7–25)
CO2: 26 mmol/L (ref 20–32)
Calcium: 9 mg/dL (ref 8.6–10.4)
Chloride: 99 mmol/L (ref 98–110)
Creat: 1.53 mg/dL — ABNORMAL HIGH (ref 0.50–0.99)
Glucose, Bld: 92 mg/dL (ref 65–99)
Potassium: 4.7 mmol/L (ref 3.5–5.3)
Sodium: 132 mmol/L — ABNORMAL LOW (ref 135–146)

## 2019-08-14 LAB — HEMOGLOBIN A1C
Hgb A1c MFr Bld: 5.5 % of total Hgb (ref <5.7)
Mean Plasma Glucose: 111 (calc)
eAG (mmol/L): 6.2 (calc)

## 2019-08-14 LAB — LIPID PANEL
Cholesterol: 112 mg/dL (ref <200)
HDL: 56 mg/dL (ref >=50)
LDL Cholesterol (Calc): 45 mg/dL (calc)
Non-HDL Cholesterol (Calc): 56 mg/dL (calc) (ref <130)
Total CHOL/HDL Ratio: 2 (calc) (ref <5.0)
Triglycerides: 40 mg/dL (ref <150)

## 2019-08-27 ENCOUNTER — Other Ambulatory Visit: Payer: Self-pay | Admitting: Family Medicine

## 2019-08-27 ENCOUNTER — Ambulatory Visit: Payer: Self-pay | Admitting: Pharmacist

## 2019-08-27 ENCOUNTER — Other Ambulatory Visit: Payer: Self-pay

## 2019-08-27 DIAGNOSIS — I10 Essential (primary) hypertension: Secondary | ICD-10-CM

## 2019-08-27 DIAGNOSIS — Z720 Tobacco use: Secondary | ICD-10-CM

## 2019-08-27 NOTE — Chronic Care Management (AMB) (Signed)
Chronic Care Management   Follow Up Note   08/27/2019 Name: Paula Long MRN: [id number] DOB: 1951/02/09  Referred by: Paula Rossetti, MD Reason for referral : Chronic Care Management (Follow Up )   Paula Long is a 69 y.o. year old female who is a primary care patient of [place], Modena Nunnery, MD. The CCM team was consulted for assistance with chronic disease management and care coordination needs.    Review of patient status, including review of consultants reports, relevant laboratory and other test results, and collaboration with appropriate care team members and the patient's provider was performed as part of comprehensive patient evaluation and provision of chronic care management services.    SDOH (Social Determinants of Health) assessments performed: No See Care Plan activities for detailed interventions related to [facility])     Outpatient Encounter Medications as of 08/27/2019  Medication Sig Note  . albuterol (PROVENTIL) (2.5 MG/3ML) 0.083% nebulizer solution INHALE THE CONTENTS OF 1 VIAL VIA NEBULIZER EVERY 4 HOURS AS NEEDED FOR WHEEZING OR SHORTNESS OF BREATH   . albuterol (VENTOLIN HFA) 108 (90 Base) MCG/ACT inhaler INHALE 2 PUFFS EVERY 4 HOURS AS NEEDED FOR WHEEZING OR SHORTNESS OF BREATH (Patient taking differently: Inhale 2 puffs into the lungs every 4 (four) hours as needed for wheezing or shortness of breath. )   . amLODipine (NORVASC) 10 MG tablet Take 1 tablet (10 mg total) by mouth daily.   Marland Kitchen apixaban (ELIQUIS) 5 MG TABS tablet Take 1 tablet (5 mg total) by mouth 2 (two) times daily.   Marland Kitchen atorvastatin (LIPITOR) 10 MG tablet TAKE 1 TABLET BY MOUTH ONCE DAILY AT 6:00 PM. (Patient taking differently: Take 10 mg by mouth daily. TAKE 1 TABLET BY MOUTH ONCE DAILY AT 6:00 PM.)   . Clobetasol Prop Emollient Base 0.05 % emollient cream Apply topically as needed.   . desmopressin (DDAVP) 0.1 MG tablet TAKE 1 TABLET AT BEDTIME FOR BLADDER (Patient taking differently: Take 0.1 mg  by mouth at bedtime. For bladder)   . dexlansoprazole (DEXILANT) 60 MG capsule Take 1 capsule (60 mg total) by mouth daily.   Regino Schultze TEST test strip USE TO TEST ONCE DAILY.   . furosemide (LASIX) 40 MG tablet Take 60 mg by mouth daily. Patient takes 1 and 1/2 tablet by mouth once daily. (43m)   . hydrALAZINE (APRESOLINE) 50 MG tablet Take 2 tablets (100 mg total) by mouth 3 (three) times daily.   .Marland KitchenHYDROmorphone (DILAUDID) 2 MG tablet Take 1 mg by mouth 2 (two) times daily.    .Marland Kitchenlabetalol (NORMODYNE) 200 MG tablet Take 1 tablet (200 mg total) by mouth 2 (two) times daily.   .Marland Kitchenlinaclotide (LINZESS) 145 MCG CAPS capsule Take 1 capsule (145 mcg total) by mouth daily before breakfast.   . nitroGLYCERIN (NITROSTAT) 0.4 MG SL tablet Place 1 tablet (0.4 mg total) under the tongue every 5 (five) minutes as needed for chest pain. 07/18/2019: Patient has taken 6 tablets today as of 07/18/2019 _0   . nystatin (MYCOSTATIN/NYSTOP) powder APPLY  TOPICALLY TWICE DAILY AS NEEDED (Patient taking differently: Apply 1 application topically 2 (two) times daily as needed (rash). APPLY  TOPICALLY TWICE DAILY AS NEEDED)   . traZODone (DESYREL) 150 MG tablet TAKE 1 TABLET AT BEDTIME    No facility-administered encounter medications on file as of 08/27/2019.     Objective:   Patient presents via telephone today for follow up on blood pressure and smoking cessation progress.  Goals Addressed  This Visit's Progress   . COPD/Tobacco Use       CARE PLAN ENTRY (see longitudinal plan of care for additional care plan information)  Current Barriers:  . 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 Previous quit attempts, unsuccessful d/t cost of nicotine patches.  . Reports motivation to quit smoking includes: her current medical condition and warnings from cardiologist  Pharmacist Clinical Goal(s):  Marland Kitchen Over the next 180 days, patient will work with PharmD and provider towards tobacco  cessation  Interventions: . Comprehensive medication review performed, medication list in electronic medical record updated . Inter-disciplinary care team collaboration (see longitudinal plan of care) . Recommend NRT patches + gum/lozenges. Recommend 21 mg patch which was called into pharmacy but she could not afford the copay. . Counseled on patch placement, side effects, and option to remove at night if they experience trouble sleeping or bad dreams.  . Highly encourage and recommend [place] Quit Line (1-800-QUIT-NOW). Patient will outreach this group for support and see if they will give her patches for free that she can try initially.   Patient Self Care Activities: . Over the next 180 days: o Patient will commit to reducing tobacco consumption. o Patient will reach out to [employer] (1-800-QUIT-NOW) to get assistance with obtaining patches.   Please see past updates related to this goal by clicking on the "Past Updates" button in the selected goal       . Hypertension - < 140/90       CARE PLAN ENTRY (see longitudinal plan of care for additional care plan information)  Current Barriers:  . Uncontrolled hypertension, complicated by COPD/tobacco use, hyperlipidemia, and diabetes. . Current antihypertensive regimen: amlodipine 56m daily, labetolol 2050mtwice daily, hydralazine 10039mhree times daily . Previous antihypertensives tried: diltiazem ACEIs/ARBs . Last practice recorded BP readings: 125-136/70s with HR 60s  .  . MMarland Kitchenst recent eGFR/CrCl: 30 ml/min  Pharmacist Clinical Goal(s):  . OMarland Kitchener the next 180 days, patient will work with PharmD and providers to optimize antihypertensive regimen  Interventions: . Inter-disciplinary care team collaboration (see longitudinal plan of care) . Comprehensive medication review performed; medication list updated in the electronic medical record.  . Continue current medications home BP looks great. . Continue to monitor daily and record in  log to provide at future appointments.  Patient Self Care Activities:  . Patient will continue to check BP daily , document, and provide at future appointments . Patient will focus on medication adherence by pill count. . Over the next 180 days she will notify PharmD or PCP if BP is consistently above > 140/90 when she is checking at home.  Please see past updates related to this goal by clicking on the "Past Updates" button in the selected goal          Hypertension   Office blood pressures are  BP Readings from Last 3 Encounters:  08/13/19 138/68  08/07/19 108/72  08/05/19 (!) 159/67    Patient has failed these meds in the past: ACEIs/ARBs have caused patient to have acute kidney injuries multiple times in the past. (large increases in creatinine)  Patient checks BP at home daily.  If out of range she will ususally check an additional time daily.  Patient home BP readings are ranging: 125-136/70s HR 60s - usually checking in the morning  Patient is currently controlled on the following medications: amlodipine 33m22mabetolol 200mg25mlets, hydralazine 100mg 65m furosemide 40mg  55mently out of amlodipine not  prescribed by our office but having difficulty getting specialist to call it in. Patient has lost around 20 lbs by watching portions and drinking mainly water.  Plan  Continue current medications.  Request refill on amlodipine sent in to pharmacy.  Tobacco   Tobacco Status:  Social History   Tobacco Use  Smoking Status Current Every Day Smoker  . Packs/day: 2.00  . Years: 48.00  . Pack years: 96.00  . Types: Cigarettes  . Start date: 01/07/1970  Smokeless Tobacco Never Used  Tobacco Comment   since age 69. smoking 2-3 packs a day    Patient never called Quitline provided at last visit.  Discussed importance of smoking cessation and importance on overall health.  Seems pre-contemplative at the moment.  Still down to 1 pack per day instead of 3.      Plan  Re-emphasized smoking cessation and assistance with patches via Quitline.  Call PharmD if in need of support to aide her in smoking cessation.     Overall Plan:  Telephone follow up scheduled for December.   Beverly Milch, PharmD Clinical Pharmacist [place] [phone]

## 2019-08-27 NOTE — Patient Instructions (Addendum)
Visit Information Thank you for seeing me today!  Please do not hesitate to call with any questions. Goals Addressed            This Visit's Progress   . COPD/Tobacco Use       CARE PLAN ENTRY (see longitudinal plan of care for additional care plan information)  Current Barriers:  . 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 Previous quit attempts, unsuccessful d/t cost of nicotine patches.  . Reports motivation to quit smoking includes: her current medical condition and warnings from cardiologist  Pharmacist Clinical Goal(s):  Marland Kitchen Over the next 180 days, patient will work with PharmD and provider towards tobacco cessation  Interventions: . Comprehensive medication review performed, medication list in electronic medical record updated . Inter-disciplinary care team collaboration (see longitudinal plan of care) . Recommend NRT patches + gum/lozenges. Recommend 21 mg patch which was called into pharmacy but she could not afford the copay. . Counseled on patch placement, side effects, and option to remove at night if they experience trouble sleeping or bad dreams.  . Highly encourage and recommend [place] Quit Line (1-800-QUIT-NOW). Patient will outreach this group for support and see if they will give her patches for free that she can try initially.   Patient Self Care Activities: . Over the next 180 days: o Patient will commit to reducing tobacco consumption. o Patient will reach out to [employer] (1-800-QUIT-NOW) to get assistance with obtaining patches.   Please see past updates related to this goal by clicking on the "Past Updates" button in the selected goal       . Hypertension - < 140/90       CARE PLAN ENTRY (see longitudinal plan of care for additional care plan information)  Current Barriers:  . Uncontrolled hypertension, complicated by COPD/tobacco use, hyperlipidemia, and diabetes. . Current antihypertensive regimen: amlodipine 18m daily, labetolol 2028m twice daily, hydralazine 10064mhree times daily . Previous antihypertensives tried: diltiazem ACEIs/ARBs . Last practice recorded BP readings: 125-136/70s with HR 60s  .  . MMarland Kitchenst recent eGFR/CrCl: 30 ml/min  Pharmacist Clinical Goal(s):  . OMarland Kitchener the next 180 days, patient will work with PharmD and providers to optimize antihypertensive regimen  Interventions: . Inter-disciplinary care team collaboration (see longitudinal plan of care) . Comprehensive medication review performed; medication list updated in the electronic medical record.  . Continue current medications home BP looks great. . Continue to monitor daily and record in log to provide at future appointments.  Patient Self Care Activities:  . Patient will continue to check BP daily , document, and provide at future appointments . Patient will focus on medication adherence by pill count. . Over the next 180 days she will notify PharmD or PCP if BP is consistently above > 140/90 when she is checking at home.  Please see past updates related to this goal by clicking on the "Past Updates" button in the selected goal         The patient verbalized understanding of instructions provided today and agreed to receive a mailed copy of patient instruction and/or educational materials.  Telephone follow up appointment with pharmacy team member scheduled for: 03/02/20 @ 12:30 PM  ChrBeverly MilchharmD Clinical Pharmacist [facility] ([phone] Tobacco Information, Adult Smoking tobacco can be harmful to your health. Tobacco contains a poisonous (toxic), colorless chemical called nicotine. Nicotine is addictive. It changes the brain and can make it hard to stop  smoking. Tobacco also has other toxic chemicals that can hurt your body and raise your risk of many cancers. How can smoking tobacco affect me? Smoking tobacco puts you at risk for:  Cancer. Smoking is most commonly associated with lung  cancer, but can also lead to cancer in other parts of the body.  Chronic obstructive pulmonary disease (COPD). This is a long-term lung condition that makes it hard to breathe. It also gets worse over time.  High blood pressure (hypertension), heart disease, stroke, or heart attack.  Lung infections, such as pneumonia.  Cataracts. This is when the lenses in the eyes become clouded.  Digestive problems. This may include peptic ulcers, heartburn, and gastroesophageal reflux disease (GERD).  Oral health problems, such as gum disease and tooth loss.  Loss of taste and smell. Smoking can affect your appearance by causing:  Wrinkles.  Yellow or stained teeth, fingers, and fingernails. Smoking tobacco can also affect your social life, because:  It may be challenging to find places to smoke when away from home. Many workplaces, [employer], hotels, and public places are tobacco-free.  Smoking is expensive. This is due to the cost of tobacco and the long-term costs of treating health problems from smoking.  Secondhand smoke may affect those around you. Secondhand smoke can cause lung cancer, breathing problems, and heart disease. Children of smokers have a higher risk for: ? Sudden infant death syndrome (SIDS). ? Ear infections. ? Lung infections. If you currently smoke tobacco, quitting now can help you:  Lead a longer and healthier life.  Look, smell, breathe, and feel better over time.  Save money.  Protect others from the harms of secondhand smoke. What actions can I take to prevent health problems? Quit smoking   Do not start smoking. Quit if you already do.  Make a plan to quit smoking and commit to it. Look for programs to help you and ask your health care provider for recommendations and ideas.  Set a date and write down all the reasons you want to quit.  Let your friends and family know you are quitting so they can help and support you. Consider finding friends who  also want to quit. It can be easier to quit with someone else, so that you can support each other.  Talk with your health care provider about using nicotine replacement medicines to help you quit, such as gum, lozenges, patches, sprays, or pills.  Do not replace cigarette smoking with electronic cigarettes, which are commonly called e-cigarettes. The safety of e-cigarettes is not known, and some may contain harmful chemicals.  If you try to quit but return to smoking, stay positive. It is common to slip up when you first quit, so take it one day at a time.  Be prepared for cravings. When you feel the urge to smoke, chew gum or suck on hard candy. Lifestyle  Stay busy and take care of your body.  Drink enough fluid to keep your urine pale yellow.  Get plenty of exercise and eat a healthy diet. This can help prevent weight gain after quitting.  Monitor your eating habits. Quitting smoking can cause you to have a larger appetite than when you smoke.  Find ways to relax. Go out with friends or family to a movie or a restaurant where people do not smoke.  Ask your health care provider about having regular tests (screenings) to check for cancer. This may include blood tests, imaging tests, and other tests.  Find ways to  manage your stress, such as meditation, yoga, or exercise. Where to find support To get support to quit smoking, consider:  Asking your health care provider for more information and resources.  Taking classes to learn more about quitting smoking.  Looking for local organizations that offer resources about quitting smoking.  Joining a support group for people who want to quit smoking in your local community.  Calling the smokefree.gov counselor helpline: 1-800-Quit-Now 201-080-3734) Where to find more information You may find more information about quitting smoking from:  HelpGuide.org: www.helpguide.org  https://hall.com/: smokefree.gov  American Lung Association:  www.lung.org Contact a health care provider if you:  Have problems breathing.  Notice that your lips, nose, or fingers turn blue.  Have chest pain.  Are coughing up blood.  Feel faint or you pass out.  Have other health changes that cause you to worry. Summary  Smoking tobacco can negatively affect your health, the health of those around you, your finances, and your social life.  Do not start smoking. Quit if you already do. If you need help quitting, ask your health care provider.  Think about joining a support group for people who want to quit smoking in your local community. There are many effective programs that will help you to quit this behavior. This information is not intended to replace advice given to you by your health care provider. Make sure you discuss any questions you have with your health care provider. Document Revised: 11/29/2018 Document Reviewed: 03/21/2016 Elsevier Patient Education  2020 [employer].

## 2019-08-29 ENCOUNTER — Ambulatory Visit: Payer: Medicare HMO | Admitting: Podiatry

## 2019-08-29 DIAGNOSIS — M545 Low back pain: Secondary | ICD-10-CM | POA: Diagnosis not present

## 2019-08-29 DIAGNOSIS — M461 Sacroiliitis, not elsewhere classified: Secondary | ICD-10-CM | POA: Diagnosis not present

## 2019-09-10 ENCOUNTER — Other Ambulatory Visit (HOSPITAL_COMMUNITY): Payer: Self-pay | Admitting: Neurology

## 2019-09-10 ENCOUNTER — Other Ambulatory Visit: Payer: Self-pay | Admitting: Neurology

## 2019-09-10 DIAGNOSIS — M545 Low back pain: Secondary | ICD-10-CM | POA: Diagnosis not present

## 2019-09-10 DIAGNOSIS — G8323 Monoplegia of upper limb affecting right nondominant side: Secondary | ICD-10-CM | POA: Diagnosis not present

## 2019-09-10 DIAGNOSIS — M461 Sacroiliitis, not elsewhere classified: Secondary | ICD-10-CM | POA: Diagnosis not present

## 2019-09-10 DIAGNOSIS — M542 Cervicalgia: Secondary | ICD-10-CM | POA: Diagnosis not present

## 2019-09-10 DIAGNOSIS — I6529 Occlusion and stenosis of unspecified carotid artery: Secondary | ICD-10-CM | POA: Diagnosis not present

## 2019-09-10 DIAGNOSIS — Z79899 Other long term (current) drug therapy: Secondary | ICD-10-CM | POA: Diagnosis not present

## 2019-09-15 ENCOUNTER — Telehealth: Payer: Self-pay | Admitting: Cardiology

## 2019-09-15 NOTE — Telephone Encounter (Signed)
Agree with ER evaluation   J Tinley Rought MD 

## 2019-09-15 NOTE — Telephone Encounter (Signed)
Pt called stating she's had chest pain x 3 days- has taken a nitro all 3 days

## 2019-09-15 NOTE — Telephone Encounter (Signed)
Reports active chest pain rated 9/10 that is radiating to right arm and back. Also reports dizziness. Denies SOB. Used nitroglyerin x's 2 today with no relief. Reports the chest pain started 3 days ago. Advised patient to go to the ED now for an evaluation. Verbalized understanding of plan.

## 2019-09-22 ENCOUNTER — Other Ambulatory Visit: Payer: Self-pay | Admitting: Family Medicine

## 2019-09-23 ENCOUNTER — Ambulatory Visit: Payer: Medicare HMO | Admitting: Podiatry

## 2019-09-23 ENCOUNTER — Encounter: Payer: Self-pay | Admitting: Podiatry

## 2019-09-23 ENCOUNTER — Telehealth: Payer: Self-pay | Admitting: *Deleted

## 2019-09-23 ENCOUNTER — Other Ambulatory Visit: Payer: Self-pay

## 2019-09-23 DIAGNOSIS — I6523 Occlusion and stenosis of bilateral carotid arteries: Secondary | ICD-10-CM

## 2019-09-23 DIAGNOSIS — E785 Hyperlipidemia, unspecified: Secondary | ICD-10-CM

## 2019-09-23 DIAGNOSIS — M79675 Pain in left toe(s): Secondary | ICD-10-CM

## 2019-09-23 DIAGNOSIS — B351 Tinea unguium: Secondary | ICD-10-CM

## 2019-09-23 DIAGNOSIS — L84 Corns and callosities: Secondary | ICD-10-CM | POA: Diagnosis not present

## 2019-09-23 DIAGNOSIS — M79674 Pain in right toe(s): Secondary | ICD-10-CM

## 2019-09-23 DIAGNOSIS — E1122 Type 2 diabetes mellitus with diabetic chronic kidney disease: Secondary | ICD-10-CM | POA: Diagnosis not present

## 2019-09-23 DIAGNOSIS — E1121 Type 2 diabetes mellitus with diabetic nephropathy: Secondary | ICD-10-CM

## 2019-09-23 DIAGNOSIS — K551 Chronic vascular disorders of intestine: Secondary | ICD-10-CM

## 2019-09-23 DIAGNOSIS — E7800 Pure hypercholesterolemia, unspecified: Secondary | ICD-10-CM

## 2019-09-23 DIAGNOSIS — I771 Stricture of artery: Secondary | ICD-10-CM

## 2019-09-23 DIAGNOSIS — N183 Chronic kidney disease, stage 3 unspecified: Secondary | ICD-10-CM

## 2019-09-23 DIAGNOSIS — I10 Essential (primary) hypertension: Secondary | ICD-10-CM

## 2019-09-23 NOTE — Telephone Encounter (Signed)
Received call from patient.   Reports that she has been seeing Dr. Harl Bowie but would like to change to Dr. Burt Knack at the [facility] office.

## 2019-09-23 NOTE — Progress Notes (Signed)
This patient returns to the office for evaluation and treatment of long thick painful nails .  This patient is unable to trim her own nails since the patient cannot reach her feet.  Patient says the nails are painful walking and wearing his shoes. She  returns for preventive foot care services.  She also has painful callus on her left forefoot.  Patient says it hurts to walk.   She has a very anxious personality.  General Appearance  Alert, conversant and in no acute stress.  Vascular  Dorsalis pedis and posterior tibial  pulses are palpable  bilaterally.  Capillary return is within normal limits  bilaterally. Temperature is within normal limits  bilaterally.  Neurologic  Senn-Weinstein monofilament wire test within normal limits  bilaterally. Muscle power within normal limits bilaterally.  Nails Thick disfigured discolored nails with subungual debris  from hallux to fifth toes bilaterally.  Pincer nails  B/L. No evidence of bacterial infection or drainage bilaterally.  Orthopedic  No limitations of motion  feet .  No crepitus or effusions noted.  No bony pathology or digital deformities noted.  Skin  normotropic skin with no porokeratosis noted bilaterally.  No signs of infections or ulcers noted.   Porokeratosis sub 4th metatarsal head left foot.  Onychomycosis  Pain in toes right foot  Pain in toes left foot  Debridement  of nails  1-5  B/L with a nail nipper.  Nails were then filed using a dremel tool with no incidents. Debride porokeratosis sub 4th met left foot with a # 15 blade.   RTC  10 weeks.   Gardiner Barefoot DPM

## 2019-09-25 ENCOUNTER — Ambulatory Visit (INDEPENDENT_AMBULATORY_CARE_PROVIDER_SITE_OTHER): Payer: Medicare HMO | Admitting: Gastroenterology

## 2019-09-25 ENCOUNTER — Encounter (INDEPENDENT_AMBULATORY_CARE_PROVIDER_SITE_OTHER): Payer: Self-pay | Admitting: Gastroenterology

## 2019-09-25 ENCOUNTER — Other Ambulatory Visit: Payer: Self-pay

## 2019-09-25 VITALS — BP 144/71 | HR 64 | Temp 98.1°F | Ht 65.0 in | Wt 202.5 lb

## 2019-09-25 DIAGNOSIS — Z860100 Personal history of colon polyps, unspecified: Secondary | ICD-10-CM | POA: Insufficient documentation

## 2019-09-25 DIAGNOSIS — K5903 Drug induced constipation: Secondary | ICD-10-CM | POA: Diagnosis not present

## 2019-09-25 MED ORDER — MAGNESIUM CITRATE PO SOLN: 1.0000 | Freq: Once | ORAL | 1 refills | Status: AC

## 2019-09-25 MED ORDER — LINACLOTIDE 145 MCG PO CAPS: 290.0000 ug | ORAL_CAPSULE | Freq: Every day | ORAL | 5 refills | Status: DC

## 2019-09-25 NOTE — Patient Instructions (Addendum)
Increase Linzess to 290 mcg qday Take magnesium citrate once Attempt to decrease intake of Dilaudid Perform ordered blood workup Schedule colonoscopy - will discuss with cardiologist bridging strategy.

## 2019-09-25 NOTE — Progress Notes (Signed)
There is a notation at the bottom of the chart stating we talked for 15 minutes

## 2019-09-25 NOTE — Progress Notes (Signed)
Maylon Peppers, M.D. Gastroenterology & Hepatology [facility] [address] [place], [place] [address]  [place] [address] [place] [address]  Referring MD: Self  I will communicate my assessment and recommendations to the referring MD via EMR. "Note: Occasional unusual wording and randomly placed punctuation marks may result from the use of speech recognition technology to transcribe this document"  Chief Complaint: Constipation  History of Present Illness: Paula Long is a 69 y.o. female with PMH HYN, afib on Eliquis, HLD, TIA, CAD, COPD, GERD, who presents for evaluation of constipation.  Patient reports that she has a longstanding history of constipation, which has been present for at least the last 5 years.  She reports that it has been getting worse recently, usually she had a bowel movement every few days but now she is having a bowel movement even after 3 weeks.  Most recently she has had to take 3 tablets of Dulcolax to have a bowel movement.  She is passing gas adequately.  States the last time she had a bowel movement was 2 weeks and 2 days ago.  Constipation has led to abdominal cramping in the middle of her abdomen and this also leads to recurrent nausea and vomiting spells every 3 to 4 weeks.  She has tried recently Linzess 145 mcg for the last month which initially helped but not anymore.  Of note, the patient reports that she has lost 20 lb in the last 3 months unintentionally  The patient has been taking Dilaudid for back pain for the last 3 months, however she has not noticed any worsening of her symptoms since she started this medication.  The patient denies having any nausea, vomiting, fever, chills, hematochezia, melena, hematemesis, abdominal distention, abdominal pain, diarrhea, jaundice, pruritus or weight changes.  Last EGD: Last Colonoscopy: 2016 7 mm polyp hot snared from hepatic flexure.4 mm polyp hot  snared from sigmoid colon.10 mm polyp hot snared from sigmoid colon. All TA, repeat in 5 years  CT abdomen w IV con 08/05/19  Bilateral renal cystic change. Fatty liver. Mild emphysema. No acute abnormality is noted.  FHx: neg for any gastrointestinal/liver disease, mother vaginal cancer, father lung cancer Social: smokes heavily packs but recently decreased to a pack and a half, alcohol or illicit drug use Surgical: hysterectomy  Past Medical History: Past Medical History:  Diagnosis Date  . Anxiety   . Arthritis 12-07-11   arthritis,DDD,spinal stenosis  . Chronic back pain   . COPD (chronic obstructive pulmonary disease) ([place]) 12-07-11   pt. uses nebulizer as needed and inhaler daily  . Fibromyalgia    skin cancer  . GERD (gastroesophageal reflux disease)   . Hemorrhoids   . Hypertension 12-07-11   presently no meds  . Normal cardiac stress test 06/2009  . Shortness of breath 12-07-11   less now, occ wheezes  . Skin cancer    melanoma.  face 2014    Past Surgical History: Past Surgical History:  Procedure Laterality Date  . ABDOMINAL HYSTERECTOMY    . BACK SURGERY  12-07-11   2'10 L5 x2  . COLONOSCOPY WITH PROPOFOL N/A 06/03/2014   Procedure: COLONOSCOPY WITH PROPOFOL; IN CECUM AT 0750; TOTAL WITHDRAWAL TIME 16 MINUTES;  Surgeon: Rogene Houston, MD;  Location: [facility];  Service: Endoscopy;  Laterality: N/A;  . left ankle surgery     tendonitis  . LUMBAR LAMINECTOMY/DECOMPRESSION MICRODISCECTOMY  12/11/2011   Procedure: LUMBAR LAMINECTOMY/DECOMPRESSION MICRODISCECTOMY;  Surgeon: Tobi Bastos, MD;  Location: [facility];  Service: Orthopedics;  Laterality: Left;  Decompressive Lumbar Laminectomy L5-S1 on Left  . POLYPECTOMY N/A 06/03/2014   Procedure: POLYPECTOMY;  Surgeon: Rogene Houston, MD;  Location: [facility];  Service: Endoscopy;  Laterality: N/A;  . right wrist surgery for pinched nerve    . trigger finger surgery  12-07-11   rt. middle trigger finger release     Family History: Family History  Problem Relation Age of Onset  . Cancer Mother        uterine  . Cancer Father        lung    Social History: Social History   Tobacco Use  Smoking Status Current Every Day Smoker  . Packs/day: 2.00  . Years: 48.00  . Pack years: 96.00  . Types: Cigarettes  . Start date: 01/07/1970  Smokeless Tobacco Never Used  Tobacco Comment   gone down to 1 pack per day    Social History   Substance and Sexual Activity  Alcohol Use No  . Alcohol/week: 0.0 standard drinks   Social History   Substance and Sexual Activity  Drug Use No    Allergies: Allergies  Allergen Reactions  . Hydrocodone-Acetaminophen   . Oxycodone-Acetaminophen   . Sulfa Antibiotics   . Nystatin Rash    Oral rash  . Percocet [Oxycodone-Acetaminophen] Rash    Oral rash  . Vesicare [Solifenacin] Rash  . Vicodin [Hydrocodone-Acetaminophen] Rash    Oral rash    Medications: Current Outpatient Medications  Medication Sig Dispense Refill  . albuterol (PROVENTIL) (2.5 MG/3ML) 0.083% nebulizer solution INHALE THE CONTENTS OF 1 VIAL VIA NEBULIZER EVERY 4 HOURS AS NEEDED FOR WHEEZING OR SHORTNESS OF BREATH 90 mL 1  . albuterol (VENTOLIN HFA) 108 (90 Base) MCG/ACT inhaler Inhale 2 puffs into the lungs every 4 (four) hours as needed for wheezing or shortness of breath. 18 g 0  . amLODipine (NORVASC) 10 MG tablet Take 1 tablet (10 mg total) by mouth daily. 30 tablet 0  . apixaban (ELIQUIS) 5 MG TABS tablet Take 1 tablet (5 mg total) by mouth 2 (two) times daily. 14 tablet 0  . atorvastatin (LIPITOR) 10 MG tablet TAKE 1 TABLET BY MOUTH ONCE DAILY AT 6:00 PM. (Patient taking differently: Take 10 mg by mouth daily. TAKE 1 TABLET BY MOUTH ONCE DAILY AT 6:00 PM.) 90 tablet 3  . Clobetasol Prop Emollient Base 0.05 % emollient cream Apply topically as needed.    . desmopressin (DDAVP) 0.1 MG tablet TAKE 1 TABLET AT BEDTIME FOR BLADDER 90 tablet 3  . dexlansoprazole (DEXILANT) 60 MG  capsule Take 1 capsule (60 mg total) by mouth daily. 90 capsule 3  . EASYMAX TEST test strip USE TO TEST ONCE DAILY. 100 each 3  . furosemide (LASIX) 40 MG tablet Take 60 mg by mouth daily. Patient takes 1 and 1/2 tablet by mouth once daily. (60mg )    . hydrALAZINE (APRESOLINE) 50 MG tablet Take 2 tablets (100 mg total) by mouth 3 (three) times daily. 540 tablet 3  . HYDROmorphone (DILAUDID) 2 MG tablet Take 1 mg by mouth 2 (two) times daily.     Marland Kitchen labetalol (NORMODYNE) 200 MG tablet Take 1 tablet (200 mg total) by mouth 2 (two) times daily.    Marland Kitchen linaclotide (LINZESS) 145 MCG CAPS capsule Take 1 capsule (145 mcg total) by mouth daily before breakfast. 30 capsule 0  . nitroGLYCERIN (NITROSTAT) 0.4 MG SL tablet Place 1 tablet (0.4  mg total) under the tongue every 5 (five) minutes as needed for chest pain. 25 tablet 3  . nystatin (MYCOSTATIN/NYSTOP) powder APPLY  TOPICALLY TWICE DAILY AS NEEDED (Patient taking differently: Apply 1 application topically 2 (two) times daily as needed (rash). APPLY  TOPICALLY TWICE DAILY AS NEEDED) 60 g 2  . traZODone (DESYREL) 150 MG tablet TAKE 1 TABLET AT BEDTIME 90 tablet 3   No current facility-administered medications for this visit.    Review of Systems: GENERAL: negative for malaise, significant weight loss, night sweats and fever HEENT: No changes in hearing or vision, no nose bleeds or other nasal problems. No trouble swallowing NECK: Negative for lumps, goiter, pain and significant neck swelling RESPIRATORY: Negative for cough, wheezing and shortness of breath CARDIOVASCULAR: Negative for chest pain, leg swelling, palpitations, orthopnea GI: SEE HPI MUSCULOSKELETAL: Negative for joint pain or swelling, back pain, and muscle pain. SKIN: Negative for lesions, rash, and itching. PSYCH: Negative for sleep disturbance, mood disorder and recent psychosocial stressors. HEMATOLOGY Negative for prolonged bleeding, bruising easily, and swollen nodes. ENDOCRINE:  Negative for cold or heat intolerance, polyuria, polydipsia and goiter. NEURO: negative for lightheadedness, dizziness, tremor, gait imbalance, syncope and seizures. The remainder of the review of systems is noncontributory.   Physical Exam: BP (!) 144/71 (BP Location: Left Arm, Patient Position: Sitting)   Pulse 64   Temp 98.1 F (36.7 C) (Oral)   Ht 5\' 5"  (1.651 m)   Wt 202 lb 8 oz (91.9 kg)   BMI 33.70 kg/m  GENERAL: The patient is AO x3, in no acute distress. HEENT: Head is normocephalic and atraumatic. EOMI are intact. Mouth is well hydrated and without lesions. NECK: Supple. No masses LUNGS: Clear to auscultation. No presence of rhonchi/wheezing/rales. Adequate chest expansion HEART: RRR, normal s1 and s2. ABDOMEN:  Mildly tender to palpation in mid abdomen, no guarding, no peritoneal signs, and nondistended. BS +. No masses. EXTREMITIES: Without any cyanosis, clubbing, rash, lesions or edema. NEUROLOGIC: AOx3, no focal motor deficit. SKIN: no jaundice, no rashes   Imaging/Labs: as above  I personally reviewed and interpreted the available labs, imaging and endoscopic files.  Impression and Plan: NAILA ELIZONDO is a 69 y.o. female with PMH HYN, afib on Eliquis, HLD, TIA, CAD, COPD, GERD, who presents for evaluation of constipation.  The patient has had chronic symptoms of constipation for some recently.  Based on the patient's history, this has not been getting worse while taking Dilaudid, but I consider that it is a strong opiate that has likely worsened her condition.  At this point, Paula Long spoke with increased to 290 mcg every day and she will be given a prescription for magnesium citrate to help her evacuate as soon as possible.  The patient was advised that she needs to attempt decreasing her intake of Dilaudid to avoid worsening of her baseline constipation.  We will check a TSH at this moment.  If the medication does not work, we will need to consider treatment with  Movantik.  Finally, the patient is due for surveillance colonoscopy as she has polyps in 2015, will schedule colonoscopy after anticoagulation bridging therapy has been discussed with cardiology.  The patient understood and agreed.  -Increase Linzess to 290 mcg qday -Magnesium citrate once -Attempt to decrease intake of Dilaudid -Check TSH -Schedule colonoscopy - will discuss with cardiologist bridging strategy.   All questions were answered.      Harvel Quale, MD

## 2019-09-26 ENCOUNTER — Other Ambulatory Visit (INDEPENDENT_AMBULATORY_CARE_PROVIDER_SITE_OTHER): Payer: Self-pay | Admitting: *Deleted

## 2019-09-26 ENCOUNTER — Telehealth (INDEPENDENT_AMBULATORY_CARE_PROVIDER_SITE_OTHER): Payer: Self-pay | Admitting: *Deleted

## 2019-09-26 ENCOUNTER — Encounter (INDEPENDENT_AMBULATORY_CARE_PROVIDER_SITE_OTHER): Payer: Self-pay | Admitting: *Deleted

## 2019-09-26 MED ORDER — SUPREP BOWEL PREP KIT 17.5-3.13-1.6 GM/177ML PO SOLN: 1.0000 | Freq: Once | ORAL | 0 refills | Status: AC

## 2019-09-26 NOTE — Telephone Encounter (Signed)
Forwarded to Dr Jenetta Downer for review

## 2019-09-26 NOTE — Telephone Encounter (Signed)
OK to hold Eliquis 2 days before procedure and resume night of procedure if OK with Dr Laural Golden.

## 2019-09-26 NOTE — Telephone Encounter (Signed)
Thanks Lelon Frohlich and [place].  Ok to restart the night of procedure if no cautery is used for potential polypectomy, if not may need to restart the day after.  Thanks,  Harvel Quale, MD Gastroenterology and Hepatology

## 2019-09-26 NOTE — Telephone Encounter (Signed)
Patient is scheduled for colonoscopy 10/17/19 - we need her to stop Eliquis 2 days before procedure - please advise if ok to stop. Thanks

## 2019-09-26 NOTE — Telephone Encounter (Signed)
Patient needs suprep 

## 2019-09-29 ENCOUNTER — Telehealth: Payer: Self-pay | Admitting: Cardiology

## 2019-09-29 NOTE — Telephone Encounter (Signed)
OK 

## 2019-09-29 NOTE — Telephone Encounter (Signed)
Pt would like to change providers from Dr. Harl Bowie to Dr. Percival Spanish. The patient lives closer to the [place] office and would like to be seen closer to home.  Please let the patient know what the office decides

## 2019-09-29 NOTE — Telephone Encounter (Signed)
Ok with me  Paula Abts MD

## 2019-09-30 NOTE — Telephone Encounter (Signed)
Attempted to contact pt by phone.  Left message to c/b to schedule in [place] with Dr Warren Lacy.

## 2019-09-30 NOTE — Telephone Encounter (Signed)
Appt with Dr Percival Spanish 8/18

## 2019-10-09 ENCOUNTER — Ambulatory Visit (HOSPITAL_COMMUNITY): Payer: Medicare HMO

## 2019-10-13 ENCOUNTER — Other Ambulatory Visit (INDEPENDENT_AMBULATORY_CARE_PROVIDER_SITE_OTHER): Payer: Self-pay | Admitting: *Deleted

## 2019-10-13 DIAGNOSIS — Z86010 Personal history of colonic polyps: Secondary | ICD-10-CM

## 2019-10-14 ENCOUNTER — Other Ambulatory Visit: Payer: Self-pay | Admitting: Family Medicine

## 2019-10-15 ENCOUNTER — Other Ambulatory Visit (HOSPITAL_COMMUNITY): Admission: RE | Admit: 2019-10-15 | Payer: Medicare HMO | Source: Ambulatory Visit

## 2019-10-15 ENCOUNTER — Encounter (INDEPENDENT_AMBULATORY_CARE_PROVIDER_SITE_OTHER): Payer: Self-pay | Admitting: *Deleted

## 2019-10-15 ENCOUNTER — Encounter (HOSPITAL_COMMUNITY): Payer: Self-pay

## 2019-10-15 ENCOUNTER — Encounter (HOSPITAL_COMMUNITY)
Admission: RE | Admit: 2019-10-15 | Discharge: 2019-10-15 | Disposition: A | Discharge status: Home / Self Care | Payer: Medicare HMO | Source: Ambulatory Visit | Attending: Gastroenterology | Admitting: Gastroenterology

## 2019-10-15 HISTORY — DX: Transient cerebral ischemic attack, unspecified: G45.9

## 2019-10-15 NOTE — Progress Notes (Signed)
Patient wasn't aware her procedure was scheduled this week.  She will call the office and reschedule for a later time.

## 2019-10-29 ENCOUNTER — Encounter (HOSPITAL_COMMUNITY): Payer: Self-pay

## 2019-10-29 ENCOUNTER — Ambulatory Visit (HOSPITAL_COMMUNITY): Payer: Medicare HMO

## 2019-11-04 ENCOUNTER — Encounter: Payer: Self-pay | Admitting: Cardiology

## 2019-11-04 DIAGNOSIS — Z7189 Other specified counseling: Secondary | ICD-10-CM | POA: Insufficient documentation

## 2019-11-04 NOTE — Progress Notes (Signed)
Cardiology Office Note   Date:  11/05/2019   ID:  Paula Long, DOB 09-Apr-1950, MRN [id number]  PCP:  Alycia Rossetti, MD  Cardiologist:   Carlyle Dolly, MD   Chief Complaint  Patient presents with  . Loss of Consciousness      History of Present Illness: Paula Long is a 69 y.o. female who was previously seen by Dr. Harl Bowie.  She has had palpitations and syncope.  She had syncope in Feb.    Recent telemetry monitor showed evidence of atrial flutter. Recent carotid artery duplex demonstrated bilateral ICA stenosis 50-69%. She had chest pain evaluated earlier this year with a negative Lexiscan Myoview.  She had an echo to evaluate SOB and this demonstrated no significant abnormalities.    Since she was last seen she has had no new cardiovascular problems.  She is limited by back pain and chronic knee pain.  She gets around her house and does some light household chores.  She does not report any new shortness of breath, PND or orthopnea.  She said no new palpitations, presyncope or syncope.  She does occasionally have chest pain.  She takes a sublingual nitroglycerin.  She has had none of this in about a month.  She has had no change in her symptoms since her negative perfusion study.  She has had no syncope in more than 6 months.  She lives with her daughter.    Past Medical History:  Diagnosis Date  . Anxiety   . Arthritis 12-07-11   arthritis,DDD,spinal stenosis  . Chronic back pain   . COPD (chronic obstructive pulmonary disease) ([place]) 12-07-11   pt. uses nebulizer as needed and inhaler daily  . Fibromyalgia    skin cancer  . GERD (gastroesophageal reflux disease)   . Hypertension 12-07-11   presently no meds  . Normal cardiac stress test 06/2009  . Skin cancer    melanoma.  face 2014  . TIA (transient ischemic attack) ?    Past Surgical History:  Procedure Laterality Date  . ABDOMINAL HYSTERECTOMY    . BACK SURGERY  12-07-11   2'10 L5 x2  . COLONOSCOPY  WITH PROPOFOL N/A 06/03/2014   Procedure: COLONOSCOPY WITH PROPOFOL; IN CECUM AT 0750; TOTAL WITHDRAWAL TIME 16 MINUTES;  Surgeon: Rogene Houston, MD;  Location: [facility];  Service: Endoscopy;  Laterality: N/A;  . left ankle surgery     tendonitis  . LUMBAR LAMINECTOMY/DECOMPRESSION MICRODISCECTOMY  12/11/2011   Procedure: LUMBAR LAMINECTOMY/DECOMPRESSION MICRODISCECTOMY;  Surgeon: Tobi Bastos, MD;  Location: [facility];  Service: Orthopedics;  Laterality: Left;  Decompressive Lumbar Laminectomy L5-S1 on Left  . POLYPECTOMY N/A 06/03/2014   Procedure: POLYPECTOMY;  Surgeon: Rogene Houston, MD;  Location: [facility];  Service: Endoscopy;  Laterality: N/A;  . right wrist surgery for pinched nerve    . trigger finger surgery  12-07-11   rt. middle trigger finger release     Current Outpatient Medications  Medication Sig Dispense Refill  . albuterol (PROVENTIL) (2.5 MG/3ML) 0.083% nebulizer solution INHALE THE CONTENTS OF 1 VIAL VIA NEBULIZER EVERY 4 HOURS AS NEEDED FOR WHEEZING OR SHORTNESS OF BREATH 90 mL 1  . albuterol (VENTOLIN HFA) 108 (90 Base) MCG/ACT inhaler INHALE 2 PUFFS EVERY 4 HOURS AS NEEDED FOR WHEEZING OR SHORTNESS OF BREATH 18 g 2  . amLODipine (NORVASC) 10 MG tablet Take 1 tablet (10 mg total) by mouth daily. 30 tablet 0  . apixaban (ELIQUIS) 5 MG TABS tablet  Take 1 tablet (5 mg total) by mouth 2 (two) times daily. 14 tablet 0  . atorvastatin (LIPITOR) 10 MG tablet TAKE 1 TABLET BY MOUTH ONCE DAILY AT 6:00 PM. (Patient taking differently: Take 10 mg by mouth daily. TAKE 1 TABLET BY MOUTH ONCE DAILY AT 6:00 PM.) 90 tablet 3  . desmopressin (DDAVP) 0.1 MG tablet TAKE 1 TABLET AT BEDTIME FOR BLADDER 90 tablet 3  . dexlansoprazole (DEXILANT) 60 MG capsule Take 1 capsule (60 mg total) by mouth daily. 90 capsule 3  . furosemide (LASIX) 40 MG tablet Take 40 mg by mouth daily as needed.    . hydrALAZINE (APRESOLINE) 50 MG tablet Take 2 tablets (100 mg total) by mouth 3 (three) times daily. 540  tablet 3  . HYDROmorphone (DILAUDID) 2 MG tablet Take 1 mg by mouth 2 (two) times daily.     Marland Kitchen labetalol (NORMODYNE) 200 MG tablet Take 1 tablet (200 mg total) by mouth 2 (two) times daily.    . nitroGLYCERIN (NITROSTAT) 0.4 MG SL tablet Place 1 tablet (0.4 mg total) under the tongue every 5 (five) minutes as needed for chest pain. 25 tablet 3  . nystatin (MYCOSTATIN/NYSTOP) powder APPLY  TOPICALLY TWICE DAILY AS NEEDED (Patient taking differently: Apply 1 application topically 2 (two) times daily as needed (rash). APPLY  TOPICALLY TWICE DAILY AS NEEDED) 60 g 2  . traZODone (DESYREL) 150 MG tablet TAKE 1 TABLET AT BEDTIME 90 tablet 3  . EASYMAX TEST test strip USE TO TEST ONCE DAILY. 100 each 3   No current facility-administered medications for this visit.    Allergies:   Hydrocodone-acetaminophen, Oxycodone-acetaminophen, Sulfa antibiotics, Nystatin, Percocet [oxycodone-acetaminophen], Vesicare [solifenacin], and Vicodin [hydrocodone-acetaminophen]    ROS:  Please see the history of present illness.   Otherwise, review of systems are positive for none.   All other systems are reviewed and negative.    PHYSICAL EXAM: VS:  BP 124/60   Pulse 61   Ht 5\' 6"  (1.676 m)   Wt 203 lb (92.1 kg)   BMI 32.77 kg/m  , BMI Body mass index is 32.77 kg/m. GENERAL:  Well appearing NECK:  No jugular venous distention, waveform within normal limits, carotid upstroke brisk and symmetric, no bruits, no thyromegaly LUNGS:  Clear to auscultation bilaterally CHEST:  Unremarkable HEART:  PMI not displaced or sustained,S1 and S2 within normal limits, no S3, no S4, no clicks, no rubs, no murmurs ABD:  Flat, positive bowel sounds normal in frequency in pitch, no bruits, no rebound, no guarding, no midline pulsatile mass, no hepatomegaly, no splenomegaly EXT:  2 plus pulses throughout, no edema, no cyanosis no clubbing   EKG:  EKG is ordered today. The ekg ordered today demonstrates sinus rhythm, rate 61, axis  within normal limits, intervals within normal limits, no acute ST-T wave changes.   Recent Labs: 08/05/2019: ALT 11; Hemoglobin 12.7; Platelets 298 08/13/2019: BUN 23; Creat 1.53; Potassium 4.7; Sodium 132    Lipid Panel    Component Value Date/Time   CHOL 112 08/13/2019 1530   TRIG 40 08/13/2019 1530   HDL 56 08/13/2019 1530   CHOLHDL 2.0 08/13/2019 1530   VLDL 7 09/30/2018 0613   LDLCALC 45 08/13/2019 1530      Wt Readings from Last 3 Encounters:  11/05/19 203 lb (92.1 kg)  09/25/19 202 lb 8 oz (91.9 kg)  08/13/19 212 lb 12.8 oz (96.5 kg)      Other studies Reviewed: Additional studies/ records that were reviewed today  include: Previous echo, left and previous cardiologist notes. Review of the above records demonstrates:  Please see elsewhere in the note.     ASSESSMENT AND PLAN:  Essential HTN :   Her blood pressure is well controlled.  No change in therapy.  Syncope: The etiology was never clear.  However, she is not having any more spells.  No change in therapy.  Atrial flutter: She is only been taking her Eliquis once a day because it makes her cold.  We had a long discussion about this and she agrees to take it twice a day.  Bilateral carotid artery stenosis:  She has bilateral carotid artery stenosis on recent carotid artery study of 50 to 69%.  She will have follow-up with carotid Dopplers next year.   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    She is not having any abdominal pain.  We talked about risk reduction.    Right renal artery stenosis : Slightly elevated creatinine.  We will follow this.  Her blood pressures well controlled.  She has not tolerated ACE inhibitors and ARB's and so we will avoid this.   Smoking:   We had a discussion about this and she is reduced her smoking from 2 packs to 1.   Chest pressure  She had a low risk Lexiscan Myoview.  She has had no further chest  discomfort.  She will continue with risk reduction.    Dyspnea on exertion: She thinks her breathing is okay.  No change in therapy.  Covid education:   She has had her vaccine.   Current medicines are reviewed at length with the patient today.  The patient does not have concerns regarding medicines.  The following changes have been made:  no change  Labs/ tests ordered today include: None  Orders Placed This Encounter  Procedures  . EKG 12-Lead     Disposition:   FU with in six months.     Signed, Minus Breeding, MD  11/05/2019 1:33 PM    [facility]

## 2019-11-05 ENCOUNTER — Encounter: Payer: Self-pay | Admitting: Cardiology

## 2019-11-05 ENCOUNTER — Other Ambulatory Visit: Payer: Self-pay

## 2019-11-05 ENCOUNTER — Ambulatory Visit (INDEPENDENT_AMBULATORY_CARE_PROVIDER_SITE_OTHER): Payer: Medicare HMO | Admitting: Cardiology

## 2019-11-05 VITALS — BP 124/60 | HR 61 | Ht 66.0 in | Wt 203.0 lb

## 2019-11-05 DIAGNOSIS — R072 Precordial pain: Secondary | ICD-10-CM | POA: Diagnosis not present

## 2019-11-05 DIAGNOSIS — I4892 Unspecified atrial flutter: Secondary | ICD-10-CM

## 2019-11-05 DIAGNOSIS — M545 Low back pain: Secondary | ICD-10-CM | POA: Diagnosis not present

## 2019-11-05 DIAGNOSIS — Z720 Tobacco use: Secondary | ICD-10-CM

## 2019-11-05 DIAGNOSIS — I739 Peripheral vascular disease, unspecified: Secondary | ICD-10-CM | POA: Diagnosis not present

## 2019-11-05 DIAGNOSIS — M461 Sacroiliitis, not elsewhere classified: Secondary | ICD-10-CM | POA: Diagnosis not present

## 2019-11-05 DIAGNOSIS — I6529 Occlusion and stenosis of unspecified carotid artery: Secondary | ICD-10-CM | POA: Diagnosis not present

## 2019-11-05 DIAGNOSIS — I6523 Occlusion and stenosis of bilateral carotid arteries: Secondary | ICD-10-CM | POA: Diagnosis not present

## 2019-11-05 DIAGNOSIS — G8323 Monoplegia of upper limb affecting right nondominant side: Secondary | ICD-10-CM | POA: Diagnosis not present

## 2019-11-05 DIAGNOSIS — Z79899 Other long term (current) drug therapy: Secondary | ICD-10-CM | POA: Diagnosis not present

## 2019-11-05 DIAGNOSIS — Z7189 Other specified counseling: Secondary | ICD-10-CM | POA: Diagnosis not present

## 2019-11-05 NOTE — Patient Instructions (Signed)
Medication Instructions:  The current medical regimen is effective;  continue present plan and medications.  *If you need a refill on your cardiac medications before your next appointment, please call your pharmacy*  Follow-Up: At [facility], you and your health needs are our priority.  As part of our continuing mission to provide you with exceptional heart care, we have created designated Provider Care Teams.  These Care Teams include your primary Cardiologist (physician) and Advanced Practice Providers (APPs -  Physician Assistants and Nurse Practitioners) who all work together to provide you with the care you need, when you need it.  We recommend signing up for the patient portal called "MyChart".  Sign up information is provided on this After Visit Summary.  MyChart is used to connect with patients for Virtual Visits (Telemedicine).  Patients are able to view lab/test results, encounter notes, upcoming appointments, etc.  Non-urgent messages can be sent to your provider as well.   To learn more about what you can do with MyChart, go to [email or web address].    Your next appointment:   6 month(s)  The format for your next appointment:   In Person  Provider:   James Hochrein, MD   Thank you for choosing [facility]!!     

## 2019-11-10 NOTE — Patient Instructions (Signed)
Paula Long  11/10/2019     @PREFPERIOPPHARMACY @   Your procedure is scheduled on  11/14/2019.  Report to [facility] at  [phone]  A.M.  Call this number if you have problems the morning of surgery:  [phone]   Remember:  Follow the diet and prep instructions given to you by the office.                     Take these medicines the morning of surgery with A SIP OF WATER  Amlodipine, dexilant, dilaudid (if needed), labetolol.    Do not wear jewelry, make-up or nail polish.  Do not wear lotions, powders, or perfumes. Please wear deodorant and brush your teeth.  Do not shave 48 hours prior to surgery.  Men may shave face and neck.  Do not bring valuables to the hospital.  [facility] is not responsible for any belongings or valuables.  Contacts, dentures or bridgework may not be worn into surgery.  Leave your suitcase in the car.  After surgery it may be brought to your room.  For patients admitted to the hospital, discharge time will be determined by your treatment team.  Patients discharged the day of surgery will not be allowed to drive home.   Name and phone number of your driver:   family Special instructions:  DO NOT smoke the morning of your procedure.  Please read over the following fact sheets that you were given. Anesthesia Post-op Instructions and Care and Recovery After Surgery       Colonoscopy, Adult, Care After This sheet gives you information about how to care for yourself after your procedure. Your health care provider may also give you more specific instructions. If you have problems or questions, contact your health care provider. What can I expect after the procedure? After the procedure, it is common to have:  A small amount of blood in your stool for 24 hours after the procedure.  Some gas.  Mild cramping or bloating of your abdomen. Follow these instructions at home: Eating and drinking   Drink enough fluid to keep your urine pale  yellow.  Follow instructions from your health care provider about eating or drinking restrictions.  Resume your normal diet as instructed by your health care provider. Avoid heavy or fried foods that are hard to digest. Activity  Rest as told by your health care provider.  Avoid sitting for a long time without moving. Get up to take short walks every 1-2 hours. This is important to improve blood flow and breathing. Ask for help if you feel weak or unsteady.  Return to your normal activities as told by your health care provider. Ask your health care provider what activities are safe for you. Managing cramping and bloating   Try walking around when you have cramps or feel bloated.  Apply heat to your abdomen as told by your health care provider. Use the heat source that your health care provider recommends, such as a moist heat pack or a heating pad. ? Place a towel between your skin and the heat source. ? Leave the heat on for 20-30 minutes. ? Remove the heat if your skin turns bright red. This is especially important if you are unable to feel pain, heat, or cold. You may have a greater risk of getting burned. General instructions  For the first 24 hours after the procedure: ? Do not drive or use machinery. ? Do  not sign important documents. ? Do not drink alcohol. ? Do your regular daily activities at a slower pace than normal. ? Eat soft foods that are easy to digest.  Take over-the-counter and prescription medicines only as told by your health care provider.  Keep all follow-up visits as told by your health care provider. This is important. Contact a health care provider if:  You have blood in your stool 2-3 days after the procedure. Get help right away if you have:  More than a small spotting of blood in your stool.  Large blood clots in your stool.  Swelling of your abdomen.  Nausea or vomiting.  A fever.  Increasing pain in your abdomen that is not relieved with  medicine. Summary  After the procedure, it is common to have a small amount of blood in your stool. You may also have mild cramping and bloating of your abdomen.  For the first 24 hours after the procedure, do not drive or use machinery, sign important documents, or drink alcohol.  Get help right away if you have a lot of blood in your stool, nausea or vomiting, a fever, or increased pain in your abdomen. This information is not intended to replace advice given to you by your health care provider. Make sure you discuss any questions you have with your health care provider. Document Revised: 09/30/2018 Document Reviewed: 09/30/2018 Elsevier Patient Education  [place] After These instructions provide you with information about caring for yourself after your procedure. Your health care provider may also give you more specific instructions. Your treatment has been planned according to current medical practices, but problems sometimes occur. Call your health care provider if you have any problems or questions after your procedure. What can I expect after the procedure? After your procedure, you may:  Feel sleepy for several hours.  Feel clumsy and have poor balance for several hours.  Feel forgetful about what happened after the procedure.  Have poor judgment for several hours.  Feel nauseous or vomit.  Have a sore throat if you had a breathing tube during the procedure. Follow these instructions at home: For at least 24 hours after the procedure:      Have a responsible adult stay with you. It is important to have someone help care for you until you are awake and alert.  Rest as needed.  Do not: ? Participate in activities in which you could fall or become injured. ? Drive. ? Use heavy machinery. ? Drink alcohol. ? Take sleeping pills or medicines that cause drowsiness. ? Make important decisions or sign legal documents. ? Take care  of children on your own. Eating and drinking  Follow the diet that is recommended by your health care provider.  If you vomit, drink water, juice, or soup when you can drink without vomiting.  Make sure you have little or no nausea before eating solid foods. General instructions  Take over-the-counter and prescription medicines only as told by your health care provider.  If you have sleep apnea, surgery and certain medicines can increase your risk for breathing problems. Follow instructions from your health care provider about wearing your sleep device: ? Anytime you are sleeping, including during daytime naps. ? While taking prescription pain medicines, sleeping medicines, or medicines that make you drowsy.  If you smoke, do not smoke without supervision.  Keep all follow-up visits as told by your health care provider. This is important. Contact a health care  provider if:  You keep feeling nauseous or you keep vomiting.  You feel light-headed.  You develop a rash.  You have a fever. Get help right away if:  You have trouble breathing. Summary  For several hours after your procedure, you may feel sleepy and have poor judgment.  Have a responsible adult stay with you for at least 24 hours or until you are awake and alert. This information is not intended to replace advice given to you by your health care provider. Make sure you discuss any questions you have with your health care provider. Document Revised: 06/04/2017 Document Reviewed: 06/27/2015 Elsevier Patient Education  [place].

## 2019-11-12 ENCOUNTER — Other Ambulatory Visit (HOSPITAL_COMMUNITY): Payer: Medicare HMO

## 2019-11-12 ENCOUNTER — Encounter (HOSPITAL_COMMUNITY): Payer: Self-pay

## 2019-11-12 ENCOUNTER — Encounter (HOSPITAL_COMMUNITY)
Admission: RE | Admit: 2019-11-12 | Discharge: 2019-11-12 | Disposition: A | Discharge status: Home / Self Care | Payer: Medicare HMO | Source: Ambulatory Visit | Attending: Gastroenterology | Admitting: Gastroenterology

## 2019-11-13 ENCOUNTER — Telehealth (INDEPENDENT_AMBULATORY_CARE_PROVIDER_SITE_OTHER): Payer: Self-pay | Admitting: *Deleted

## 2019-11-13 NOTE — Telephone Encounter (Signed)
Patient did not show for her preop/covid test yesterday for procedure for tomorrow. Day surgery tried serenal times to contact patient and I did as well with no luck either - her procedures have been cancelled

## 2019-11-13 NOTE — Telephone Encounter (Signed)
Thanks Ann 

## 2019-11-14 ENCOUNTER — Encounter (HOSPITAL_COMMUNITY): Admission: RE | Payer: Self-pay | Source: Home / Self Care

## 2019-11-14 ENCOUNTER — Ambulatory Visit (HOSPITAL_COMMUNITY): Admission: RE | Admit: 2019-11-14 | Payer: Medicare HMO | Source: Home / Self Care | Admitting: Gastroenterology

## 2019-11-14 SURGERY — COLONOSCOPY WITH PROPOFOL
Anesthesia: Monitor Anesthesia Care

## 2019-11-22 ENCOUNTER — Other Ambulatory Visit: Payer: Self-pay | Admitting: Family Medicine

## 2019-11-27 ENCOUNTER — Telehealth: Payer: Self-pay | Admitting: *Deleted

## 2019-11-27 DIAGNOSIS — M79676 Pain in unspecified toe(s): Secondary | ICD-10-CM | POA: Diagnosis not present

## 2019-11-27 DIAGNOSIS — B351 Tinea unguium: Secondary | ICD-10-CM | POA: Diagnosis not present

## 2019-11-27 MED ORDER — NYSTATIN 100000 UNIT/GM EX POWD: Freq: Two times a day (BID) | CUTANEOUS | 2 refills | Status: DC | PRN

## 2019-11-27 NOTE — Telephone Encounter (Signed)
Received request from pharmacy for PA on Nystatin Powder.   PA submitted.   Dx: B37.89- yeast under breasts  Received immediate PA determination.   PA [id number] approved 03/21/2019- 03/19/2020.

## 2019-12-05 ENCOUNTER — Ambulatory Visit: Payer: Medicare HMO | Admitting: Cardiology

## 2019-12-16 ENCOUNTER — Ambulatory Visit: Payer: Medicare HMO | Admitting: Podiatry

## 2019-12-31 DIAGNOSIS — M5459 Other low back pain: Secondary | ICD-10-CM | POA: Diagnosis not present

## 2019-12-31 DIAGNOSIS — M461 Sacroiliitis, not elsewhere classified: Secondary | ICD-10-CM | POA: Diagnosis not present

## 2019-12-31 DIAGNOSIS — I6529 Occlusion and stenosis of unspecified carotid artery: Secondary | ICD-10-CM | POA: Diagnosis not present

## 2019-12-31 DIAGNOSIS — Z79899 Other long term (current) drug therapy: Secondary | ICD-10-CM | POA: Diagnosis not present

## 2019-12-31 DIAGNOSIS — G8323 Monoplegia of upper limb affecting right nondominant side: Secondary | ICD-10-CM | POA: Diagnosis not present

## 2020-01-08 ENCOUNTER — Telehealth: Payer: Self-pay | Admitting: Pharmacist

## 2020-01-08 NOTE — Progress Notes (Signed)
Chronic Care Management Pharmacy Assistant   Name: Paula Long  MRN: [id number] DOB: 02/10/1951  Reason for Encounter: Disease State  Patient Questions:  1.  Have you seen any other providers since your last visit? Yes  2.  Any changes in your medicines or health? Yes    PCP : Alycia Rossetti, MD   Their chronic conditions include: Hypertension, COPD/Tobacco Use.  Office Visits: 11-05-2019 (PCP) Patient presented in the office c/o loss of consciousness. Patient had hx of syncope. Recent telemetry monitor showed evidence of A-fib. Dr. Buelah Manis was not able to determine the etiology of patient's syncope, but has not had any more recurring spells. Patient did report only taking her Eliquis once a day because it made her cold. The importance of taking this medication as directed was discussed, and patient agreed to take it twice daily.  Furosemide was decreased from 60 mg to 40 mg daily PRN. Clobetasol 0.05% and Linaclotide 154 mcg caps twice daily were discontinued.  Consults: 11-27-2019 (Podiatry) Patient presented in the office with Dr. Irving Shows for nail debridement.  11-14-2019 (Endo) Colonoscopy caneled  09-25-2019 Gertie Fey) Patient presented in the office complaining of longstanding constipation. She reports that it has been getting worse recently, usually she had a bowel movement every few days but now she is having a bowel movement even after 3 weeks. The patient has been taking Dilaudid for back pain for the last 3 months, however she has not noticed any worsening of her symptoms since she started this medication. The following modications were made at the time of her visit: Increase Linzess to 290 mcg qday, Magnesium citrate once, Attempt to decrease intake of Dilaudid, Check TSH, Schedule colonoscopy.  Allergies:   Allergies  Allergen Reactions   Hydrocodone-Acetaminophen    Oxycodone-Acetaminophen    Sulfa Antibiotics    Nystatin Rash    Oral rash   Percocet  [Oxycodone-Acetaminophen] Rash    Oral rash   Vesicare [Solifenacin] Rash   Vicodin [Hydrocodone-Acetaminophen] Rash    Oral rash    Medications: Outpatient Encounter Medications as of 01/08/2020  Medication Sig Note   albuterol (PROVENTIL) (2.5 MG/3ML) 0.083% nebulizer solution INHALE THE CONTENTS OF 1 VIAL VIA NEBULIZER EVERY 4 HOURS AS NEEDED FOR WHEEZING OR SHORTNESS OF BREATH    albuterol (VENTOLIN HFA) 108 (90 Base) MCG/ACT inhaler INHALE 2 PUFFS EVERY 4 HOURS AS NEEDED FOR WHEEZING OR SHORTNESS OF BREATH    amLODipine (NORVASC) 10 MG tablet Take 1 tablet (10 mg total) by mouth daily.    apixaban (ELIQUIS) 5 MG TABS tablet Take 1 tablet (5 mg total) by mouth 2 (two) times daily.    atorvastatin (LIPITOR) 10 MG tablet TAKE 1 TABLET BY MOUTH ONCE DAILY AT 6:00 PM. (Patient taking differently: Take 10 mg by mouth daily. TAKE 1 TABLET BY MOUTH ONCE DAILY AT 6:00 PM.)    desmopressin (DDAVP) 0.1 MG tablet TAKE 1 TABLET AT BEDTIME FOR BLADDER    dexlansoprazole (DEXILANT) 60 MG capsule Take 1 capsule (60 mg total) by mouth daily.    EASYMAX TEST test strip USE TO TEST ONCE DAILY.    furosemide (LASIX) 40 MG tablet Take 40 mg by mouth daily as needed.    hydrALAZINE (APRESOLINE) 50 MG tablet Take 2 tablets (100 mg total) by mouth 3 (three) times daily.    HYDROmorphone (DILAUDID) 2 MG tablet Take 1 mg by mouth 2 (two) times daily.     IBU 600 MG tablet TAKE 1 TABLET TWICE  DAILY AS NEEDED    labetalol (NORMODYNE) 200 MG tablet Take 1 tablet (200 mg total) by mouth 2 (two) times daily.    nitroGLYCERIN (NITROSTAT) 0.4 MG SL tablet Place 1 tablet (0.4 mg total) under the tongue every 5 (five) minutes as needed for chest pain. 07/18/2019: Patient has taken 6 tablets today as of 07/18/2019 @1413    nystatin (MYCOSTATIN/NYSTOP) powder Apply topically 2 (two) times daily as needed.    traZODone (DESYREL) 150 MG tablet TAKE 1 TABLET AT BEDTIME    No facility-administered encounter  medications on file as of 01/08/2020.    Current Diagnosis: Patient Active Problem List   Diagnosis Date Noted   Educated about COVID-19 virus infection 11/04/2019   History of colonic polyps 09/25/2019   Osteopenia 08/13/2019   Constipation 08/13/2019   Peripheral neuropathy 08/13/2019   Hypertensive urgency 07/18/2019   Paroxysmal atrial flutter ([place]) 07/18/2019   Leg edema 03/25/2019   Memory changes 12/13/2018   B12 deficiency 12/13/2018   Carotid artery disease ([place]) 10/08/2018   Diabetes mellitus with nephropathy ([place])    CKD stage 3 secondary to diabetes ([place])    Hyperlipidemia    TIA (transient ischemic attack) 09/29/2018   Peripheral edema 09/04/2018   Depression with anxiety 05/22/2014   Family hx of colon cancer 02/19/2014   IBS (irritable bowel syndrome) 01/23/2014   Urinary incontinence, mixed 10/13/2013   Chronic insomnia 09/12/2013   Hemorrhoids 10/22/2012   Intertrigo 06/21/2012   Neck pain on left side 06/21/2012   Bunion of great toe 06/21/2012   OAB (overactive bladder) 03/15/2012   Spinal stenosis, lumbar region, with neurogenic claudication 12/11/2011   Essential hypertension 10/30/2011   Chronic back pain 10/30/2011   GERD (gastroesophageal reflux disease) 10/30/2011   COPD (chronic obstructive pulmonary disease) ([place]) 10/30/2011   Tobacco user 10/30/2011   Obesity 10/30/2011    Goals Addressed   None    Reviewed chart prior to disease state call. Spoke with patient regarding BP  Recent Office Vitals: BP Readings from Last 3 Encounters:  11/05/19 124/60  09/25/19 (!) 144/71  08/13/19 138/68   Pulse Readings from Last 3 Encounters:  11/05/19 61  09/25/19 64  08/13/19 78    Wt Readings from Last 3 Encounters:  11/05/19 203 lb (92.1 kg)  09/25/19 202 lb 8 oz (91.9 kg)  08/13/19 212 lb 12.8 oz (96.5 kg)     Kidney Function Lab Results  Component Value Date/Time   CREATININE 1.53 (H) 08/13/2019 03:30  PM   CREATININE 1.67 (H) 08/05/2019 11:30 AM   CREATININE 1.74 (H) 07/18/2019 12:38 PM   CREATININE 1.61 (H) 04/14/2019 03:32 PM   GFRNONAA 31 (L) 08/05/2019 11:30 AM   GFRAA 36 (L) 08/05/2019 11:30 AM    BMP Latest Ref Rng & Units 08/13/2019 08/05/2019 07/18/2019  Glucose 65 - 99 mg/dL 92 115(H) 99  BUN 7 - 25 mg/dL 23 22 37(H)  Creatinine 0.50 - 0.99 mg/dL 1.53(H) 1.67(H) 1.74(H)  BUN/Creat Ratio 6 - 22 (calc) 15 - -  Sodium 135 - 146 mmol/L 132(L) 137 135  Potassium 3.5 - 5.3 mmol/L 4.7 4.1 3.3(L)  Chloride 98 - 110 mmol/L 99 99 99  CO2 20 - 32 mmol/L 26 25 24   Calcium 8.6 - 10.4 mg/dL 9.0 9.1 9.2     Current antihypertensive regimen:  o amlodipine 10 mg daily (pateint states she is not taking this medication) o  labetolol 200 mg twice daily, o  hydralazine 100 mg three times daily  How often are you checking your Blood Pressure? 1-2x per week    Current home BP readings: 157/84   What recent interventions/DTPs have been made by any provider to improve Blood Pressure control since last CPP Visit: none sat this time   Any recent hospitalizations or ED visits since last visit with CPP? No    What diet changes have been made to improve Blood Pressure Control?  o None at this time   What exercise is being done to improve your Blood Pressure Control?  o Patient states she is limited to how much physical activity she can do because her of her leg pain.  Patient states she has not any fainting episodes in "quite a while". She says she is in pain daily due to nerve damage in her left leg damage. Reminded that patient of her appointment with the clinical pharmacist on Tuesday December 14th at 12:30 pm.  Adherence Review: Is the patient currently on ACE/ARB medication? No Does the patient have >5 day gap between last estimated fill dates? No CPP please confirm   Follow-Up:  Pharmacist Review   Fanny Skates, [place] Pharmacist Assistant [phone]

## 2020-01-16 ENCOUNTER — Telehealth: Payer: Self-pay | Admitting: Pharmacist

## 2020-01-16 NOTE — Progress Notes (Signed)
Verified Adherence Gap Information. Per insurance data, the patient is 90-99% compliant with their Atorvastatin 10 mg. The next fill date indicated in 12-01-2019.  Fanny Skates, [place] Pharmacist Assistant [phone]

## 2020-01-22 ENCOUNTER — Other Ambulatory Visit: Payer: Self-pay | Admitting: Family Medicine

## 2020-01-29 ENCOUNTER — Other Ambulatory Visit: Payer: Self-pay | Admitting: Family Medicine

## 2020-02-07 ENCOUNTER — Other Ambulatory Visit: Payer: Self-pay | Admitting: Family Medicine

## 2020-02-18 ENCOUNTER — Encounter: Payer: Self-pay | Admitting: Family Medicine

## 2020-02-18 ENCOUNTER — Ambulatory Visit (INDEPENDENT_AMBULATORY_CARE_PROVIDER_SITE_OTHER): Payer: Medicare HMO | Admitting: Family Medicine

## 2020-02-18 ENCOUNTER — Other Ambulatory Visit: Payer: Self-pay

## 2020-02-18 VITALS — BP 144/82 | HR 68 | Temp 98.6°F | Resp 16 | Ht 66.0 in | Wt 199.0 lb

## 2020-02-18 DIAGNOSIS — I4892 Unspecified atrial flutter: Secondary | ICD-10-CM | POA: Diagnosis not present

## 2020-02-18 DIAGNOSIS — E1122 Type 2 diabetes mellitus with diabetic chronic kidney disease: Secondary | ICD-10-CM

## 2020-02-18 DIAGNOSIS — Z6832 Body mass index (BMI) 32.0-32.9, adult: Secondary | ICD-10-CM

## 2020-02-18 DIAGNOSIS — E6609 Other obesity due to excess calories: Secondary | ICD-10-CM

## 2020-02-18 DIAGNOSIS — J418 Mixed simple and mucopurulent chronic bronchitis: Secondary | ICD-10-CM | POA: Diagnosis not present

## 2020-02-18 DIAGNOSIS — L219 Seborrheic dermatitis, unspecified: Secondary | ICD-10-CM

## 2020-02-18 DIAGNOSIS — N183 Chronic kidney disease, stage 3 unspecified: Secondary | ICD-10-CM | POA: Diagnosis not present

## 2020-02-18 DIAGNOSIS — E1121 Type 2 diabetes mellitus with diabetic nephropathy: Secondary | ICD-10-CM | POA: Diagnosis not present

## 2020-02-18 DIAGNOSIS — I10 Essential (primary) hypertension: Secondary | ICD-10-CM | POA: Diagnosis not present

## 2020-02-18 MED ORDER — FLUOCINOLONE ACETONIDE BODY 0.01 % EX OIL: TOPICAL_OIL | CUTANEOUS | 1 refills | Status: DC

## 2020-02-18 MED ORDER — KETOCONAZOLE 2 % EX SHAM: 1.0000 "application " | MEDICATED_SHAMPOO | CUTANEOUS | 2 refills | Status: DC

## 2020-02-18 NOTE — Assessment & Plan Note (Signed)
BMI continues to improve

## 2020-02-18 NOTE — Assessment & Plan Note (Signed)
Bp looks good for her based on past history Advised to call her cardiologist with any elevated levels She is in regular rhythem

## 2020-02-18 NOTE — Assessment & Plan Note (Signed)
Diet controlled, craving more sugar, recheck A1C and renal function

## 2020-02-18 NOTE — Assessment & Plan Note (Signed)
No recent flares, she has had coivd vaccine No change to meds

## 2020-02-18 NOTE — Progress Notes (Signed)
   Subjective:    Patient ID: Paula Long, female    DOB: 1950/04/19, 69 y.o.   MRN: [id number]  Patient presents for Scalp Itching (states that she has checked for lice and not had any seen, but she has treated x2- severe itching continues) and BP Issues (systolic elevated 841'Y)  DM- diet controlled - last done in May, last  5.5%, she has been eating more sweets   weight down 3-4lbs   CKD- Recheck renal function  HTN- bp at home is up and down, today has been good   OAB- continue DDVAP  Polyneuropathy/ Chronic Back pain - per pain clinic   COPD/Emphysema- she is trying to reduce her tobacco, prn albuterol  Itchy scalp for 3 months. If she gets really hot she itches a lot. She self treated for lice but it didn't help with the itching. Uses Pantene shampoo for years     Review Of Systems:  GEN- denies fatigue, fever, weight loss,weakness, recent illness HEENT- denies eye drainage, change in vision, nasal discharge, CVS- denies chest pain, palpitations RESP- denies SOB, cough, wheeze ABD- denies N/V, change in stools, abd pain GU- denies dysuria, hematuria, dribbling, incontinence MSK- denies joint pain, muscle aches, injury Neuro- denies headache, dizziness, syncope, seizure activity       Objective:    BP (!) 144/82   Pulse 68   Temp 98.6 F (37 C) (Temporal)   Resp 16   Ht 5\' 6"  (1.676 m)   Wt 199 lb (90.3 kg)   SpO2 95%   BMI 32.12 kg/m  GEN- NAD, alert and oriented x3 HEENT- PERRL, EOMI, non injected sclera, pink conjunctiva, MMM, oropharynx clear Neck- Supple, no thyromegaly CVS- RRR, no murmur RESP-CTAB Skin- scalp a few scabs noted and erythema, no other lesions in scalp or hair EXT- No edema Pulses- Radial, DP- 2+        Assessment & Plan:      Problem List Items Addressed This Visit      Unprioritized   CKD stage 3 secondary to diabetes ([place])   Relevant Orders   CBC with Differential/Platelet   Comprehensive metabolic panel    Hemoglobin A1c   COPD (chronic obstructive pulmonary disease) (HCC)    No recent flares, she has had coivd vaccine No change to meds      Diabetes mellitus with nephropathy ([facility])    Diet controlled, craving more sugar, recheck A1C and renal function      Relevant Orders   CBC with Differential/Platelet   Comprehensive metabolic panel   Essential hypertension    Bp looks good for her based on past history Advised to call her cardiologist with any elevated levels She is in regular rhythem      Obesity    BMI continues to improve      Paroxysmal atrial flutter ([place])    Other Visit Diagnoses    Seborrheic dermatitis    -  Primary   treat with nizoral and topical steroid to scalp      Note: This dictation was prepared with Dragon dictation along with smaller phrase technology. Any transcriptional errors that result from this process are unintentional.

## 2020-02-18 NOTE — Patient Instructions (Signed)
We will call with lab results Use shampoo and steroid oil to scalp  Call your heart doctor if your blood pressure stays above  150/90 F/U 4 MONTHS

## 2020-02-19 LAB — COMPREHENSIVE METABOLIC PANEL
AG Ratio: 1.6 (calc) (ref 1.0–2.5)
ALT: 8 U/L (ref 6–29)
AST: 11 U/L (ref 10–35)
Albumin: 4.1 g/dL (ref 3.6–5.1)
Alkaline phosphatase (APISO): 64 U/L (ref 37–153)
BUN/Creatinine Ratio: 14 (calc) (ref 6–22)
BUN: 21 mg/dL (ref 7–25)
CO2: 27 mmol/L (ref 20–32)
Calcium: 9.4 mg/dL (ref 8.6–10.4)
Chloride: 101 mmol/L (ref 98–110)
Creat: 1.53 mg/dL — ABNORMAL HIGH (ref 0.50–0.99)
Globulin: 2.5 g/dL (calc) (ref 1.9–3.7)
Glucose, Bld: 93 mg/dL (ref 65–99)
Potassium: 4.7 mmol/L (ref 3.5–5.3)
Sodium: 136 mmol/L (ref 135–146)
Total Bilirubin: 0.2 mg/dL (ref 0.2–1.2)
Total Protein: 6.6 g/dL (ref 6.1–8.1)

## 2020-02-19 LAB — CBC WITH DIFFERENTIAL/PLATELET
Absolute Monocytes: 615 cells/uL (ref 200–950)
Basophils Absolute: 98 cells/uL (ref 0–200)
Basophils Relative: 1.3 %
Eosinophils Absolute: 128 cells/uL (ref 15–500)
Eosinophils Relative: 1.7 %
HCT: 39 % (ref 35.0–45.0)
Hemoglobin: 12.8 g/dL (ref 11.7–15.5)
Lymphs Abs: 2063 cells/uL (ref 850–3900)
MCH: 30.7 pg (ref 27.0–33.0)
MCHC: 32.8 g/dL (ref 32.0–36.0)
MCV: 93.5 fL (ref 80.0–100.0)
MPV: 11.5 fL (ref 7.5–12.5)
Monocytes Relative: 8.2 %
Neutro Abs: 4598 cells/uL (ref 1500–7800)
Neutrophils Relative %: 61.3 %
Platelets: 286 10*3/uL (ref 140–400)
RBC: 4.17 10*6/uL (ref 3.80–5.10)
RDW: 13.1 % (ref 11.0–15.0)
Total Lymphocyte: 27.5 %
WBC: 7.5 10*3/uL (ref 3.8–10.8)

## 2020-02-19 LAB — HEMOGLOBIN A1C
Hgb A1c MFr Bld: 5.4 % of total Hgb (ref <5.7)
Mean Plasma Glucose: 108 (calc)
eAG (mmol/L): 6 (calc)

## 2020-02-20 ENCOUNTER — Encounter: Payer: Self-pay | Admitting: *Deleted

## 2020-02-20 NOTE — Chronic Care Management (AMB) (Signed)
Chronic Care Management Pharmacy  Name: Paula Long  MRN: [id number] DOB: 02-03-1951   Chief Complaint/ HPI  Paula Long,  69 y.o. , female presents for their Follow-Up CCM visit with the clinical pharmacist via telephone.  PCP : Alycia Rossetti, MD  Their chronic conditions include: hypertension, COPD, GERD, CKD, diabetes hyperlipidemia.  Office Visits: 02/18/2020 [facility]) - presented for scalp itching, given steroid oil for scalp and told to call her heart doctor if BP consistently > 150/90.  07/16/2019 [facility] [place]) - Called in with yellow mucus possible COPD exacerbation or sinusitis, started doxycycline and prednisone 40mg  for 5 days and Mucinex DM.  Also recommended COVID-19 testing  04/14/2019 [facility]) - Elevated BP in office, patient to continue to monitor BP at home, checking renal function.   Consult Visit:  07/18/2019 [facility]) - Presented for chest pain, systolic > 672 upon arrival to ED, discontinued diltiazem and switched to amlodipine 10mg  daily, patient anxious to discharge home  07/08/2019 [facility]) - Came in for 8/10 chest pressure and said "doesn't feel right".  EKG showed sinus bradycardia (rate of 48)  07/01/2019 [facility]) - Patient used to be on ACEI/ARB but was d/c due to worsening renal function, long time smoker - had one more pack left and then was going to stop  06/06/2019 [facility]) - Came in for really elevated blood pressures, home BPs 180s-90s, increased hydralazine to 75mg  tid   04/23/2019 ([place]) - Referred for syncope, chest pain at home, leaned over kitchen sink, woke up with ambulance helping her out.  Medications: Outpatient Encounter Medications as of 03/02/2020  Medication Sig Note  . albuterol (PROVENTIL) (2.5 MG/3ML) 0.083% nebulizer solution INHALE THE CONTENTS OF 1 VIAL VIA NEBULIZER EVERY 4 HOURS AS NEEDED FOR WHEEZING OR SHORTNESS OF BREATH   . albuterol (VENTOLIN HFA) 108 (90 Base) MCG/ACT inhaler INHALE 2  PUFFS EVERY 4 HOURS AS NEEDED FOR WHEEZING OR SHORTNESS OF BREATH   . amLODipine (NORVASC) 10 MG tablet Take 1 tablet (10 mg total) by mouth daily.   Marland Kitchen apixaban (ELIQUIS) 5 MG TABS tablet Take 1 tablet (5 mg total) by mouth 2 (two) times daily.   Marland Kitchen atorvastatin (LIPITOR) 10 MG tablet TAKE 1 TABLET BY MOUTH ONCE DAILY AT 6:00 PM.   . desmopressin (DDAVP) 0.1 MG tablet TAKE 1 TABLET AT BEDTIME FOR BLADDER   . dexlansoprazole (DEXILANT) 60 MG capsule Take 1 capsule (60 mg total) by mouth daily.   Regino Schultze TEST test strip USE TO TEST ONCE DAILY.   Marland Kitchen Fluocinolone Acetonide Body (DERMA-SMOOTHE/FS BODY) 0.01 % OIL Apply to scalp daily a three times a week   . furosemide (LASIX) 40 MG tablet TAKE 1 TABLET TWICE DAILY   . hydrALAZINE (APRESOLINE) 50 MG tablet Take 2 tablets (100 mg total) by mouth 3 (three) times daily.   Marland Kitchen HYDROmorphone (DILAUDID) 2 MG tablet Take 1 mg by mouth 2 (two) times daily.    . IBU 600 MG tablet TAKE 1 TABLET TWICE DAILY AS NEEDED   . ketoconazole (NIZORAL) 2 % shampoo Apply 1 application topically 2 (two) times a week.   . labetalol (NORMODYNE) 200 MG tablet Take 1 tablet (200 mg total) by mouth 2 (two) times daily.   . nitroGLYCERIN (NITROSTAT) 0.4 MG SL tablet Place 1 tablet (0.4 mg total) under the tongue every 5 (five) minutes as needed for chest pain. 07/18/2019: Patient has taken 6 tablets today as of 07/18/2019 @1413   . nystatin (MYCOSTATIN/NYSTOP) powder Apply  topically 2 (two) times daily as needed.   . traZODone (DESYREL) 150 MG tablet TAKE 1 TABLET AT BEDTIME    No facility-administered encounter medications on file as of 03/02/2020.     Current Diagnosis/Assessment:  Goals Addressed            This Visit's Progress   . COPD/Tobacco Use       CARE PLAN ENTRY (see longitudinal plan of care for additional care plan information)  Current Barriers:  . 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 Previous quit attempts, unsuccessful d/t cost of  nicotine patches.  . Reports motivation to quit smoking includes: her current medical condition and warnings from cardiologist  Pharmacist Clinical Goal(s):  Marland Kitchen Over the next 120 days, patient will work with PharmD and provider towards tobacco cessation  Interventions: . Comprehensive medication review performed, medication list in electronic medical record updated . Highly encourage and recommend [place] Quit Line (1-800-QUIT-NOW). Patient will outreach this group for support and see if they will give her patches for free that she can try initially.   Patient Self Care Activities: . Over the next 120 days: o Patient will work to again try and cut back on cigarettes o Contact providers should she need emotional support on quitting   Please see past updates related to this goal by clicking on the "Past Updates" button in the selected goal       . Hypertension - < 140/90       CARE PLAN ENTRY (see longitudinal plan of care for additional care plan information)  Current Barriers:  . Uncontrolled hypertension, complicated by COPD/tobacco use, hyperlipidemia, and diabetes. . Current antihypertensive regimen:  o amlodipine 10mg  daily o labetolol 200mg  twice daily o hydralazine 100mg  three times daily . Previous antihypertensives tried: diltiazem ACEIs/ARBs . Last practice recorded BP readings: 144/82    Pharmacist Clinical Goal(s):  Marland Kitchen Over the next 120 days, patient will work with PharmD and providers to optimize antihypertensive regimen  Interventions: . Inter-disciplinary care team collaboration (see longitudinal plan of care) . Comprehensive medication review performed; medication list updated in the electronic medical record.  . Reviewed home BP readings . Discussed medication adherence  Patient Self Care Activities:  . Patient will continue to check BP daily , document, and provide at future appointments . Patient will focus on medication adherence by pill count. . Over the  next 120 days she will notify PharmD or PCP if BP is consistently above > 140/90 when she is checking at home.  Please see past updates related to this goal by clicking on the "Past Updates" button in the selected goal         Diabetes   A1c goal <7%  Recent Relevant Labs: Lab Results  Component Value Date/Time   HGBA1C 5.4 02/18/2020 03:56 PM   HGBA1C 5.5 08/13/2019 03:30 PM    Last diabetic Eye exam: No results found for: HMDIABEYEEXA  Last diabetic Foot exam: No results found for: HMDIABFOOTEX   Checking BG: Never   Patient has failed these meds in past: none Patient is currently controlled on the following medications: . None  We discussed:   Most recent A1c is great, patient reports no symptoms of hyperglycemia  Plan  Continue control with diet   Hyperlipidemia   Lipid Panel     Component Value Date/Time   CHOL 112 08/13/2019 1530   TRIG 40 08/13/2019 1530   HDL 56 08/13/2019 1530   CHOLHDL 2.0 08/13/2019 1530  VLDL 7 09/30/2018 0613   LDLCALC 45 08/13/2019 1530      Patient has failed these meds in past: none noted Patient is currently controlled on the following medications:   atorvastatin 10mg  tablet  We discussed:    Patient is unable to exercise still due to her L leg pain (nerve pain).  She is even having a hard time walking lately and reports having to drag her leg to be able to move around.  Reports adherence with statin medication, most recent lipid panel is WNL.  Continue to focus on medication adherence.  Plan  Continue current medications  Hypertension   Office blood pressures are  BP Readings from Last 3 Encounters:  02/18/20 (!) 144/82  11/05/19 124/60  09/25/19 (!) 144/71    Patient has failed these meds in the past: ACEIs/ARBs have caused patient to have acute kidney injuries multiple times in the past. (large increases in creatinine)  Patient checks BP at home daily.    Patient home BP readings are ranging: 144/82  today, no other readings available   Patient is currently uncontrolled on the following medications:   amlodipine 10mg   labetolol 200mg  tablets  hydralazine 100mg  tid  furosemide 40mg    We discussed   She continues to report adherence with medication  Denies swelling  Reports her BP are up and down lately but mostly in the 376-283T systolic.  Counseled on her goal of keeping BP below 140/90.  She is smoking a lot again which will have this effect on her BP.  Encouraged her to continue to monitor daily and contact us if BP > 140/90 consistently. Plan  Continue current medications.  Continue home monitoring Contact providers if BP > 140/90  COPD/Tobacco    Eosinophil count:   Lab Results  Component Value Date/Time   EOSPCT 0 04/18/2019 02:25 PM  %                               Eos (Absolute):  Lab Results  Component Value Date/Time   EOSABS 0.0 04/18/2019 02:25 PM    Tobacco Status:  Social History   Tobacco Use  Smoking Status Current Every Day Smoker  . Packs/day: 2.00  . Years: 48.00  . Pack years: 96.00  . Types: Cigarettes  . Start date: 01/07/1970  Smokeless Tobacco Never Used  Tobacco Comment   since age 21. smoking 2-3 packs a day    Patient has failed these meds in past: Advair Patient is currently controlled on the following medications:   albuterol inh  albuterol nebulizer Using maintenance inhaler regularly? No, she reports maybe using in once weekly Frequency of rescue inhaler use:  Once weekly  We discussed:  Unfortunately she has reversed courses when it comes to smoking cessation.  Due to stress and other things in her life she states she is now back up to 2-3 packs per day.  Although she says most of the time she only smokes about half of each cigarette.   Encouraged her to get back to the mindset of where she was when she was able to cut back.  She never called for free patches.  Seems to be back in the pre-contemplative  stages of quitting or making changes at this point.  Plan  Continue current meds Continued to encourage smoking cessation  GERD    Patient has failed these meds in past: none Patient is currently controlled on the following  medications: Dexilant 60mg   She is currently having mild symptoms taking medication just prn.  Counseled on proper administration of daily in the morning about 30 minutes before eating, she agreed to try.  Plan  Continue current medications, change dosing to daily instead of prn to see if this helps improve symptoms.    Medication Management   . Miscellaneous medications: gabapentin 400mg , hydromorphone 2mg   . OTC's: nicotine patches,  . Patient currently uses [employer]   . Patient reports using vials in two separate locations to help her remember to take medications. . Patient denies missed doses of medication.   Beverly Milch, PharmD Clinical Pharmacist [facility] [phone]

## 2020-02-23 ENCOUNTER — Ambulatory Visit: Payer: Medicare HMO | Admitting: Family Medicine

## 2020-02-23 ENCOUNTER — Ambulatory Visit: Payer: Medicare HMO | Admitting: Cardiology

## 2020-02-26 DIAGNOSIS — M79676 Pain in unspecified toe(s): Secondary | ICD-10-CM | POA: Diagnosis not present

## 2020-02-26 DIAGNOSIS — B351 Tinea unguium: Secondary | ICD-10-CM | POA: Diagnosis not present

## 2020-02-26 DIAGNOSIS — L84 Corns and callosities: Secondary | ICD-10-CM | POA: Diagnosis not present

## 2020-02-26 DIAGNOSIS — I70203 Unspecified atherosclerosis of native arteries of extremities, bilateral legs: Secondary | ICD-10-CM | POA: Diagnosis not present

## 2020-03-02 ENCOUNTER — Ambulatory Visit: Payer: Medicare HMO | Admitting: Pharmacist

## 2020-03-02 DIAGNOSIS — Z720 Tobacco use: Secondary | ICD-10-CM

## 2020-03-02 DIAGNOSIS — I10 Essential (primary) hypertension: Secondary | ICD-10-CM

## 2020-03-02 DIAGNOSIS — E785 Hyperlipidemia, unspecified: Secondary | ICD-10-CM

## 2020-03-02 NOTE — Patient Instructions (Addendum)
Visit Information  Happy Holidays!!  Goals Addressed            This Visit's Progress   . COPD/Tobacco Use       CARE PLAN ENTRY (see longitudinal plan of care for additional care plan information)  Current Barriers:  . 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 Previous quit attempts, unsuccessful d/t cost of nicotine patches.  . Reports motivation to quit smoking includes: her current medical condition and warnings from cardiologist  Pharmacist Clinical Goal(s):  Marland Kitchen Over the next 120 days, patient will work with PharmD and provider towards tobacco cessation  Interventions: . Comprehensive medication review performed, medication list in electronic medical record updated . Highly encourage and recommend [place] Quit Line (1-800-QUIT-NOW). Patient will outreach this group for support and see if they will give her patches for free that she can try initially.   Patient Self Care Activities: . Over the next 120 days: o Patient will work to again try and cut back on cigarettes o Contact providers should she need emotional support on quitting   Please see past updates related to this goal by clicking on the "Past Updates" button in the selected goal       . Hypertension - < 140/90       CARE PLAN ENTRY (see longitudinal plan of care for additional care plan information)  Current Barriers:  . Uncontrolled hypertension, complicated by COPD/tobacco use, hyperlipidemia, and diabetes. . Current antihypertensive regimen:  o amlodipine 10mg  daily o labetolol 200mg  twice daily o hydralazine 100mg  three times daily . Previous antihypertensives tried: diltiazem ACEIs/ARBs . Last practice recorded BP readings: 144/82    Pharmacist Clinical Goal(s):  Marland Kitchen Over the next 120 days, patient will work with PharmD and providers to optimize antihypertensive regimen  Interventions: . Inter-disciplinary care team collaboration (see longitudinal plan of care) . Comprehensive medication  review performed; medication list updated in the electronic medical record.  . Reviewed home BP readings . Discussed medication adherence  Patient Self Care Activities:  . Patient will continue to check BP daily , document, and provide at future appointments . Patient will focus on medication adherence by pill count. . Over the next 120 days she will notify PharmD or PCP if BP is consistently above > 140/90 when she is checking at home.  Please see past updates related to this goal by clicking on the "Past Updates" button in the selected goal         The patient verbalized understanding of instructions, educational materials, and care plan provided today and agreed to receive a mailed copy of patient instructions, educational materials, and care plan.   Telephone follow up appointment with pharmacy team member scheduled for: 4 months  Beverly Milch, PharmD Clinical Pharmacist [facility] [phone]  Steps to Quit Smoking Smoking tobacco is the leading cause of preventable death. It can affect almost every organ in the body. Smoking puts you and people around you at risk for many serious, long-lasting (chronic) diseases. Quitting smoking can be hard, but it is one of the best things that you can do for your health. It is never too late to quit. How do I get ready to quit? When you decide to quit smoking, make a plan to help you succeed. Before you quit:  Pick a date to quit. Set a date within the next 2 weeks to give you time to prepare.  Write down the reasons why you are quitting. Keep  this list in places where you will see it often.  Tell your family, friends, and co-workers that you are quitting. Their support is important.  Talk with your doctor about the choices that may help you quit.  Find out if your health insurance will pay for these treatments.  Know the people, places, things, and activities that make you want to smoke (triggers). Avoid  them. What first steps can I take to quit smoking?  Throw away all cigarettes at home, at work, and in your car.  Throw away the things that you use when you smoke, such as ashtrays and lighters.  Clean your car. Make sure to empty the ashtray.  Clean your home, including curtains and carpets. What can I do to help me quit smoking? Talk with your doctor about taking medicines and seeing a counselor at the same time. You are more likely to succeed when you do both.  If you are pregnant or breastfeeding, talk with your doctor about counseling or other ways to quit smoking. Do not take medicine to help you quit smoking unless your doctor tells you to do so. To quit smoking: Quit right away  Quit smoking totally, instead of slowly cutting back on how much you smoke over a period of time.  Go to counseling. You are more likely to quit if you go to counseling sessions regularly. Take medicine You may take medicines to help you quit. Some medicines need a prescription, and some you can buy over-the-counter. Some medicines may contain a drug called nicotine to replace the nicotine in cigarettes. Medicines may:  Help you to stop having the desire to smoke (cravings).  Help to stop the problems that come when you stop smoking (withdrawal symptoms). Your doctor may ask you to use:  Nicotine patches, gum, or lozenges.  Nicotine inhalers or sprays.  Non-nicotine medicine that is taken by mouth. Find resources Find resources and other ways to help you quit smoking and remain smoke-free after you quit. These resources are most helpful when you use them often. They include:  Online chats with a Social worker.  Phone quitlines.  Printed Furniture conservator/restorer.  Support groups or group counseling.  Text messaging programs.  Mobile phone apps. Use apps on your mobile phone or tablet that can help you stick to your quit plan. There are many free apps for mobile phones and tablets as well as  websites. Examples include Quit Guide from the [employer] and smokefree.gov  What things can I do to make it easier to quit?   Talk to your family and friends. Ask them to support and encourage you.  Call a phone quitline (1-800-QUIT-NOW), reach out to support groups, or work with a Social worker.  Ask people who smoke to not smoke around you.  Avoid places that make you want to smoke, such as: ? Bars. ? Parties. ? Smoke-break areas at work.  Spend time with people who do not smoke.  Lower the stress in your life. Stress can make you want to smoke. Try these things to help your stress: ? Getting regular exercise. ? Doing deep-breathing exercises. ? Doing yoga. ? Meditating. ? Doing a body scan. To do this, close your eyes, focus on one area of your body at a time from head to toe. Notice which parts of your body are tense. Try to relax the muscles in those areas. How will I feel when I quit smoking? Day 1 to 3 weeks Within the first 24 hours, you may start  to have some problems that come from quitting tobacco. These problems are very bad 2-3 days after you quit, but they do not often last for more than 2-3 weeks. You may get these symptoms:  Mood swings.  Feeling restless, nervous, angry, or annoyed.  Trouble concentrating.  Dizziness.  Strong desire for high-sugar foods and nicotine.  Weight gain.  Trouble pooping (constipation).  Feeling like you may vomit (nausea).  Coughing or a sore throat.  Changes in how the medicines that you take for other issues work in your body.  Depression.  Trouble sleeping (insomnia). Week 3 and afterward After the first 2-3 weeks of quitting, you may start to notice more positive results, such as:  Better sense of smell and taste.  Less coughing and sore throat.  Slower heart rate.  Lower blood pressure.  Clearer skin.  Better breathing.  Fewer sick days. Quitting smoking can be hard. Do not give up if you fail the first time.  Some people need to try a few times before they succeed. Do your best to stick to your quit plan, and talk with your doctor if you have any questions or concerns. Summary  Smoking tobacco is the leading cause of preventable death. Quitting smoking can be hard, but it is one of the best things that you can do for your health.  When you decide to quit smoking, make a plan to help you succeed.  Quit smoking right away, not slowly over a period of time.  When you start quitting, seek help from your doctor, family, or friends. This information is not intended to replace advice given to you by your health care provider. Make sure you discuss any questions you have with your health care provider. Document Revised: 11/29/2018 Document Reviewed: 05/25/2018 Elsevier Patient Education  [place].

## 2020-03-09 ENCOUNTER — Emergency Department (HOSPITAL_COMMUNITY): Payer: Medicare HMO

## 2020-03-09 ENCOUNTER — Emergency Department (HOSPITAL_COMMUNITY)
Admission: EM | Admit: 2020-03-09 | Discharge: 2020-03-09 | Disposition: A | Discharge status: Home / Self Care | Payer: Medicare HMO | Attending: Emergency Medicine | Admitting: Emergency Medicine

## 2020-03-09 ENCOUNTER — Other Ambulatory Visit: Payer: Self-pay

## 2020-03-09 DIAGNOSIS — E1122 Type 2 diabetes mellitus with diabetic chronic kidney disease: Secondary | ICD-10-CM | POA: Diagnosis not present

## 2020-03-09 DIAGNOSIS — I480 Paroxysmal atrial fibrillation: Secondary | ICD-10-CM | POA: Insufficient documentation

## 2020-03-09 DIAGNOSIS — R109 Unspecified abdominal pain: Secondary | ICD-10-CM | POA: Diagnosis not present

## 2020-03-09 DIAGNOSIS — J449 Chronic obstructive pulmonary disease, unspecified: Secondary | ICD-10-CM | POA: Insufficient documentation

## 2020-03-09 DIAGNOSIS — G8929 Other chronic pain: Secondary | ICD-10-CM | POA: Diagnosis not present

## 2020-03-09 DIAGNOSIS — R0902 Hypoxemia: Secondary | ICD-10-CM | POA: Diagnosis not present

## 2020-03-09 DIAGNOSIS — R220 Localized swelling, mass and lump, head: Secondary | ICD-10-CM | POA: Diagnosis not present

## 2020-03-09 DIAGNOSIS — S0990XA Unspecified injury of head, initial encounter: Secondary | ICD-10-CM | POA: Diagnosis not present

## 2020-03-09 DIAGNOSIS — M5459 Other low back pain: Secondary | ICD-10-CM | POA: Diagnosis not present

## 2020-03-09 DIAGNOSIS — K219 Gastro-esophageal reflux disease without esophagitis: Secondary | ICD-10-CM | POA: Diagnosis not present

## 2020-03-09 DIAGNOSIS — Z85828 Personal history of other malignant neoplasm of skin: Secondary | ICD-10-CM | POA: Insufficient documentation

## 2020-03-09 DIAGNOSIS — J90 Pleural effusion, not elsewhere classified: Secondary | ICD-10-CM | POA: Diagnosis not present

## 2020-03-09 DIAGNOSIS — S3991XA Unspecified injury of abdomen, initial encounter: Secondary | ICD-10-CM | POA: Diagnosis not present

## 2020-03-09 DIAGNOSIS — M2578 Osteophyte, vertebrae: Secondary | ICD-10-CM | POA: Diagnosis not present

## 2020-03-09 DIAGNOSIS — Z79899 Other long term (current) drug therapy: Secondary | ICD-10-CM | POA: Insufficient documentation

## 2020-03-09 DIAGNOSIS — S0083XA Contusion of other part of head, initial encounter: Secondary | ICD-10-CM | POA: Insufficient documentation

## 2020-03-09 DIAGNOSIS — I2510 Atherosclerotic heart disease of native coronary artery without angina pectoris: Secondary | ICD-10-CM | POA: Diagnosis not present

## 2020-03-09 DIAGNOSIS — Z7901 Long term (current) use of anticoagulants: Secondary | ICD-10-CM | POA: Insufficient documentation

## 2020-03-09 DIAGNOSIS — S299XXA Unspecified injury of thorax, initial encounter: Secondary | ICD-10-CM | POA: Diagnosis not present

## 2020-03-09 DIAGNOSIS — I1310 Hypertensive heart and chronic kidney disease without heart failure, with stage 1 through stage 4 chronic kidney disease, or unspecified chronic kidney disease: Secondary | ICD-10-CM | POA: Insufficient documentation

## 2020-03-09 DIAGNOSIS — F17210 Nicotine dependence, cigarettes, uncomplicated: Secondary | ICD-10-CM | POA: Diagnosis not present

## 2020-03-09 DIAGNOSIS — S0993XA Unspecified injury of face, initial encounter: Secondary | ICD-10-CM | POA: Diagnosis present

## 2020-03-09 DIAGNOSIS — M545 Low back pain, unspecified: Secondary | ICD-10-CM | POA: Diagnosis not present

## 2020-03-09 DIAGNOSIS — Y92410 Unspecified street and highway as the place of occurrence of the external cause: Secondary | ICD-10-CM | POA: Diagnosis not present

## 2020-03-09 DIAGNOSIS — I10 Essential (primary) hypertension: Secondary | ICD-10-CM | POA: Diagnosis not present

## 2020-03-09 DIAGNOSIS — N183 Chronic kidney disease, stage 3 unspecified: Secondary | ICD-10-CM | POA: Diagnosis not present

## 2020-03-09 DIAGNOSIS — S3992XA Unspecified injury of lower back, initial encounter: Secondary | ICD-10-CM | POA: Diagnosis not present

## 2020-03-09 DIAGNOSIS — G9389 Other specified disorders of brain: Secondary | ICD-10-CM | POA: Diagnosis not present

## 2020-03-09 DIAGNOSIS — S199XXA Unspecified injury of neck, initial encounter: Secondary | ICD-10-CM | POA: Diagnosis not present

## 2020-03-09 DIAGNOSIS — M47812 Spondylosis without myelopathy or radiculopathy, cervical region: Secondary | ICD-10-CM | POA: Diagnosis not present

## 2020-03-09 DIAGNOSIS — S161XXA Strain of muscle, fascia and tendon at neck level, initial encounter: Secondary | ICD-10-CM | POA: Diagnosis not present

## 2020-03-09 DIAGNOSIS — G319 Degenerative disease of nervous system, unspecified: Secondary | ICD-10-CM | POA: Diagnosis not present

## 2020-03-09 LAB — URINALYSIS, ROUTINE W REFLEX MICROSCOPIC
Bacteria, UA: NONE SEEN
Bilirubin Urine: NEGATIVE
Glucose, UA: NEGATIVE mg/dL
Ketones, ur: NEGATIVE mg/dL
Leukocytes,Ua: NEGATIVE
Nitrite: NEGATIVE
Protein, ur: NEGATIVE mg/dL
Specific Gravity, Urine: 1.014 (ref 1.005–1.030)
pH: 7 (ref 5.0–8.0)

## 2020-03-09 LAB — COMPREHENSIVE METABOLIC PANEL
ALT: 14 U/L (ref 0–44)
AST: 19 U/L (ref 15–41)
Albumin: 4 g/dL (ref 3.5–5.0)
Alkaline Phosphatase: 59 U/L (ref 38–126)
Anion gap: 12 (ref 5–15)
BUN: 21 mg/dL (ref 8–23)
CO2: 21 mmol/L — ABNORMAL LOW (ref 22–32)
Calcium: 8.9 mg/dL (ref 8.9–10.3)
Chloride: 100 mmol/L (ref 98–111)
Creatinine, Ser: 1.42 mg/dL — ABNORMAL HIGH (ref 0.44–1.00)
GFR, Estimated: 40 mL/min — ABNORMAL LOW (ref >60)
Glucose, Bld: 115 mg/dL — ABNORMAL HIGH (ref 70–99)
Potassium: 4.2 mmol/L (ref 3.5–5.1)
Sodium: 133 mmol/L — ABNORMAL LOW (ref 135–145)
Total Bilirubin: 0.4 mg/dL (ref 0.3–1.2)
Total Protein: 7.2 g/dL (ref 6.5–8.1)

## 2020-03-09 LAB — CBC
HCT: 39.8 % (ref 36.0–46.0)
Hemoglobin: 12.8 g/dL (ref 12.0–15.0)
MCH: 30.6 pg (ref 26.0–34.0)
MCHC: 32.2 g/dL (ref 30.0–36.0)
MCV: 95.2 fL (ref 80.0–100.0)
Platelets: 211 10*3/uL (ref 150–400)
RBC: 4.18 MIL/uL (ref 3.87–5.11)
RDW: 14.9 % (ref 11.5–15.5)
WBC: 15.5 10*3/uL — ABNORMAL HIGH (ref 4.0–10.5)
nRBC: 0 % (ref 0.0–0.2)

## 2020-03-09 LAB — SAMPLE TO BLOOD BANK

## 2020-03-09 LAB — LACTIC ACID, PLASMA: Lactic Acid, Venous: 0.6 mmol/L (ref 0.5–1.9)

## 2020-03-09 LAB — PROTIME-INR
INR: 1 (ref 0.8–1.2)
Prothrombin Time: 13.2 seconds (ref 11.4–15.2)

## 2020-03-09 LAB — ETHANOL: Alcohol, Ethyl (B): <10 mg/dL (ref <10)

## 2020-03-09 MED ORDER — HYDROMORPHONE HCL 1 MG/ML IJ SOLN
1.0000 mg | Freq: Once | INTRAMUSCULAR | Status: AC
  Administered 2020-03-09: 1 mg via INTRAVENOUS
  Filled 2020-03-09: qty 1

## 2020-03-09 MED ORDER — IOHEXOL 300 MG/ML  SOLN
75.0000 mL | Freq: Once | INTRAMUSCULAR | Status: AC | PRN
  Administered 2020-03-09: 21:00:00 75 mL via INTRAVENOUS

## 2020-03-09 MED ORDER — HYDROMORPHONE HCL 1 MG/ML IJ SOLN
1.0000 mg | Freq: Once | INTRAMUSCULAR | Status: AC
  Administered 2020-03-09: 20:00:00 1 mg via INTRAVENOUS
  Filled 2020-03-09: qty 1

## 2020-03-09 MED ORDER — ONDANSETRON HCL 4 MG/2ML IJ SOLN
4.0000 mg | Freq: Once | INTRAMUSCULAR | Status: AC
  Administered 2020-03-09: 4 mg via INTRAVENOUS
  Filled 2020-03-09: qty 2

## 2020-03-09 NOTE — ED Notes (Signed)
Lab at bedside obtaining blood.

## 2020-03-09 NOTE — ED Notes (Signed)
Pt ambulated in hall, VS updated. Pt c/o pain, orders placed per MAR.

## 2020-03-09 NOTE — ED Notes (Signed)
To CT, L. Sophia,PA-C aware of pt most recent VS and trending elevated BP. Pt requesting pain medicine. Orders have been placed. Will administer upon return from CT.

## 2020-03-09 NOTE — ED Notes (Signed)
Pt unable to tolerate CT scan, brought back to room. Medicated per MAR. CT will return for pt.
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
## 2020-03-09 NOTE — Discharge Instructions (Signed)
Follow up with your Physician for recheck.  Take blood pressure medication as directed.   No driving

## 2020-03-09 NOTE — ED Notes (Signed)
Patient transported to CT 
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
## 2020-03-11 ENCOUNTER — Telehealth: Payer: Self-pay | Admitting: Family Medicine

## 2020-03-11 NOTE — Telephone Encounter (Signed)
Spoke with pt will call back if needed to been seen
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
## 2020-03-18 ENCOUNTER — Telehealth: Payer: Self-pay | Admitting: Pharmacist

## 2020-03-18 NOTE — Progress Notes (Signed)
Verified Adherence Gap Information. Per insurance data, the patient meet her A1c testing documentation. Her A1c less than 9. Total gap equalled to 2. Patient was 35% adherence to Losartan 25 mg and 99 % adherence to Atorvastatin 10 mg.  Hulen Luster, RMA Clinical Pharmacist Assistant [phone]

## 2020-03-26 ENCOUNTER — Encounter: Payer: Self-pay | Admitting: Family Medicine

## 2020-03-26 ENCOUNTER — Ambulatory Visit (INDEPENDENT_AMBULATORY_CARE_PROVIDER_SITE_OTHER): Payer: Medicare HMO | Admitting: Family Medicine

## 2020-03-26 ENCOUNTER — Other Ambulatory Visit: Payer: Self-pay

## 2020-03-26 DIAGNOSIS — I10 Essential (primary) hypertension: Secondary | ICD-10-CM

## 2020-03-26 DIAGNOSIS — J418 Mixed simple and mucopurulent chronic bronchitis: Secondary | ICD-10-CM | POA: Diagnosis not present

## 2020-03-26 DIAGNOSIS — M48062 Spinal stenosis, lumbar region with neurogenic claudication: Secondary | ICD-10-CM | POA: Diagnosis not present

## 2020-03-26 DIAGNOSIS — G8929 Other chronic pain: Secondary | ICD-10-CM | POA: Diagnosis not present
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
## 2020-03-26 NOTE — Patient Instructions (Addendum)
Referral to therapy- Home Health  F/U 4 months

## 2020-03-26 NOTE — Assessment & Plan Note (Signed)
COPD at baseline, no changes to meds

## 2020-03-26 NOTE — Assessment & Plan Note (Signed)
BP at baseline, no changes
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
## 2020-04-05 ENCOUNTER — Other Ambulatory Visit: Payer: Self-pay | Admitting: Family Medicine

## 2020-04-21 DIAGNOSIS — M461 Sacroiliitis, not elsewhere classified: Secondary | ICD-10-CM | POA: Diagnosis not present

## 2020-04-21 DIAGNOSIS — Z79899 Other long term (current) drug therapy: Secondary | ICD-10-CM | POA: Diagnosis not present

## 2020-04-21 DIAGNOSIS — I10 Essential (primary) hypertension: Secondary | ICD-10-CM | POA: Diagnosis not present

## 2020-04-21 DIAGNOSIS — M5459 Other low back pain: Secondary | ICD-10-CM | POA: Diagnosis not present

## 2020-04-21 DIAGNOSIS — I6529 Occlusion and stenosis of unspecified carotid artery: Secondary | ICD-10-CM | POA: Diagnosis not present

## 2020-04-26 ENCOUNTER — Telehealth: Payer: Self-pay | Admitting: Pharmacist
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
## 2020-04-27 DIAGNOSIS — L280 Lichen simplex chronicus: Secondary | ICD-10-CM | POA: Diagnosis not present

## 2020-05-05 ENCOUNTER — Other Ambulatory Visit: Payer: Self-pay | Admitting: Family Medicine

## 2020-05-11 DIAGNOSIS — R55 Syncope and collapse: Secondary | ICD-10-CM | POA: Insufficient documentation

## 2020-05-11 DIAGNOSIS — I774 Celiac artery compression syndrome: Secondary | ICD-10-CM | POA: Insufficient documentation
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
## 2020-05-12 ENCOUNTER — Other Ambulatory Visit: Payer: Self-pay

## 2020-05-12 ENCOUNTER — Ambulatory Visit: Payer: Medicare HMO | Admitting: Cardiology

## 2020-05-12 ENCOUNTER — Encounter: Payer: Self-pay | Admitting: Cardiology

## 2020-05-12 VITALS — BP 160/78 | HR 76 | Ht 65.0 in | Wt 192.0 lb

## 2020-05-12 DIAGNOSIS — I6523 Occlusion and stenosis of bilateral carotid arteries: Secondary | ICD-10-CM

## 2020-05-12 DIAGNOSIS — I4892 Unspecified atrial flutter: Secondary | ICD-10-CM

## 2020-05-12 DIAGNOSIS — R55 Syncope and collapse: Secondary | ICD-10-CM

## 2020-05-12 DIAGNOSIS — I774 Celiac artery compression syndrome: Secondary | ICD-10-CM

## 2020-05-12 DIAGNOSIS — I771 Stricture of artery: Secondary | ICD-10-CM

## 2020-05-12 MED ORDER — APIXABAN 5 MG PO TABS: 5.0000 mg | ORAL_TABLET | Freq: Two times a day (BID) | ORAL | 6 refills | Status: DC

## 2020-05-12 MED ORDER — HYDRALAZINE HCL 50 MG PO TABS: 100.0000 mg | ORAL_TABLET | Freq: Three times a day (TID) | ORAL | 3 refills | Status: DC

## 2020-05-12 MED ORDER — LABETALOL HCL 200 MG PO TABS: 200.0000 mg | ORAL_TABLET | Freq: Two times a day (BID) | ORAL | 3 refills | Status: DC

## 2020-05-12 MED ORDER — NITROGLYCERIN 0.4 MG SL SUBL: 0.4000 mg | SUBLINGUAL_TABLET | SUBLINGUAL | 3 refills | Status: DC | PRN

## 2020-05-12 MED ORDER — AMLODIPINE BESYLATE 10 MG PO TABS: 10.0000 mg | ORAL_TABLET | Freq: Every day | ORAL | 3 refills | Status: DC

## 2020-05-12 NOTE — Patient Instructions (Signed)
Medication Instructions:  The current medical regimen is effective;  continue present plan and medications.  *If you need a refill on your cardiac medications before your next appointment, please call your pharmacy*  Follow-Up: At [facility], you and your health needs are our priority.  As part of our continuing mission to provide you with exceptional heart care, we have created designated Provider Care Teams.  These Care Teams include your primary Cardiologist (physician) and Advanced Practice Providers (APPs -  Physician Assistants and Nurse Practitioners) who all work together to provide you with the care you need, when you need it.  We recommend signing up for the patient portal called "MyChart".  Sign up information is provided on this After Visit Summary.  MyChart is used to connect with patients for Virtual Visits (Telemedicine).  Patients are able to view lab/test results, encounter notes, upcoming appointments, etc.  Non-urgent messages can be sent to your provider as well.   To learn more about what you can do with MyChart, go to [email or web address].    Your next appointment:   4 month(s)  The format for your next appointment:   In Person  Provider:   Minus Breeding, MD   Thank you for choosing [facility]!!

## 2020-05-14 ENCOUNTER — Other Ambulatory Visit: Payer: Self-pay

## 2020-05-14 MED ORDER — AMLODIPINE BESYLATE 10 MG PO TABS: 10.0000 mg | ORAL_TABLET | Freq: Every day | ORAL | 3 refills | Status: DC

## 2020-05-17 DIAGNOSIS — I6529 Occlusion and stenosis of unspecified carotid artery: Secondary | ICD-10-CM | POA: Diagnosis not present

## 2020-05-17 DIAGNOSIS — M5459 Other low back pain: Secondary | ICD-10-CM | POA: Diagnosis not present

## 2020-05-17 DIAGNOSIS — I10 Essential (primary) hypertension: Secondary | ICD-10-CM | POA: Diagnosis not present

## 2020-05-17 DIAGNOSIS — M461 Sacroiliitis, not elsewhere classified: Secondary | ICD-10-CM | POA: Diagnosis not present

## 2020-05-17 DIAGNOSIS — Z79899 Other long term (current) drug therapy: Secondary | ICD-10-CM | POA: Diagnosis not present

## 2020-05-19 ENCOUNTER — Encounter: Payer: Self-pay | Admitting: Internal Medicine

## 2020-05-19 ENCOUNTER — Other Ambulatory Visit: Payer: Self-pay

## 2020-05-19 ENCOUNTER — Ambulatory Visit (INDEPENDENT_AMBULATORY_CARE_PROVIDER_SITE_OTHER): Payer: Medicare HMO | Admitting: Internal Medicine

## 2020-05-19 VITALS — BP 192/98 | HR 77 | Temp 98.5°F | Resp 18 | Ht 65.0 in | Wt 197.4 lb

## 2020-05-19 DIAGNOSIS — K219 Gastro-esophageal reflux disease without esophagitis: Secondary | ICD-10-CM

## 2020-05-19 DIAGNOSIS — Z7689 Persons encountering health services in other specified circumstances: Secondary | ICD-10-CM | POA: Diagnosis not present

## 2020-05-19 DIAGNOSIS — J418 Mixed simple and mucopurulent chronic bronchitis: Secondary | ICD-10-CM

## 2020-05-19 DIAGNOSIS — I4892 Unspecified atrial flutter: Secondary | ICD-10-CM | POA: Diagnosis not present

## 2020-05-19 DIAGNOSIS — Z2821 Immunization not carried out because of patient refusal: Secondary | ICD-10-CM

## 2020-05-19 DIAGNOSIS — E1122 Type 2 diabetes mellitus with diabetic chronic kidney disease: Secondary | ICD-10-CM | POA: Diagnosis not present

## 2020-05-19 DIAGNOSIS — F5104 Psychophysiologic insomnia: Secondary | ICD-10-CM

## 2020-05-19 DIAGNOSIS — N183 Chronic kidney disease, stage 3 unspecified: Secondary | ICD-10-CM | POA: Diagnosis not present

## 2020-05-19 DIAGNOSIS — G6289 Other specified polyneuropathies: Secondary | ICD-10-CM | POA: Diagnosis not present

## 2020-05-19 DIAGNOSIS — J449 Chronic obstructive pulmonary disease, unspecified: Secondary | ICD-10-CM | POA: Diagnosis not present

## 2020-05-19 DIAGNOSIS — I160 Hypertensive urgency: Secondary | ICD-10-CM | POA: Diagnosis not present

## 2020-05-19 DIAGNOSIS — M48062 Spinal stenosis, lumbar region with neurogenic claudication: Secondary | ICD-10-CM | POA: Diagnosis not present

## 2020-05-19 MED ORDER — LABETALOL HCL 200 MG PO TABS: 200.0000 mg | ORAL_TABLET | Freq: Two times a day (BID) | ORAL | 0 refills | Status: DC

## 2020-05-19 MED ORDER — AMLODIPINE BESYLATE 10 MG PO TABS
10.0000 mg | ORAL_TABLET | Freq: Every day | ORAL | 0 refills | Status: DC
Start: 2020-05-19 — End: 2020-06-14

## 2020-05-19 NOTE — Assessment & Plan Note (Signed)
Currently in sinus rhythm Chart review suggests that she should be on labetalol for rate control and HTN On Eliquis

## 2020-05-19 NOTE — Assessment & Plan Note (Signed)
On Trazodone 150 mg QD 

## 2020-05-19 NOTE — Assessment & Plan Note (Addendum)
BP Readings from Last 1 Encounters:  05/19/20 (!) 192/98   Uncontrolled due to noncompliance Unsure of home medications According to chart review, she should be on Amlodipine 10 mg QD, Hydralazine 100 mg TID, Labetalol 200 mg BID and Lasix 40 mg PRN for leg swelling Sent Amlodipine and Labetalol to local pharmacy for now Counseled for compliance with the medications Advised DASH diet and moderate exercise/walking as tolerated Follows up with Cardiologist

## 2020-05-19 NOTE — Patient Instructions (Signed)
Please start taking Amlodipine and Labetalol as prescribed. Continue to take Hydralazine as prescribed.  Please follow low sodium diet.  Please quit/cut down smoking.  Please continue to take other medications as prescribed.  Please bring your home medications in the next visit.

## 2020-05-19 NOTE — Assessment & Plan Note (Signed)
S/p lumbar spine surgeries Follows up with Dr Doonquah for pain management - on Norco PRN, takes about twice daily 

## 2020-05-19 NOTE — Progress Notes (Signed)
New Patient Office Visit  Subjective:  Patient ID: Paula Long, female    DOB: 1950/03/22  Age: 70 y.o. MRN: [id number]  CC:  Chief Complaint  Patient presents with  . 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  Her BP was elevated office today upon multiple measurements.  She is supposed to be on amlodipine, hydralazine, labetalol and as needed Lasix according to the chart review.  But she currently has only hydralazine and Lasix at home.  She has not received amlodipine from mail-order pharmacy yet, which was prescribed by her cardiologist recently.  She is unaware of labetalol.  She has been having generalized headache for last 4 days.  Denies any chest pain, dyspnea or palpitations.  She has a history of lumbar radiculopathy and has had 2 lumbar spine surgeries in the past.  She has chronic low back pain, for which she follows up with Dr. [name].  She takes Dilaudid as needed -about twice daily.  She is also on Cymbalta for peripheral neuropathy.  She is on trazodone for anxiety and insomnia.  She has had 2 doses of COVID vaccine.  Denies flu vaccine.  Past Medical History:  Diagnosis Date  . Anxiety   . Arthritis 12-07-11   arthritis,DDD,spinal stenosis  . Chronic back pain   . COPD (chronic obstructive pulmonary disease) ([place]) 12-07-11   pt. uses nebulizer as needed and inhaler daily  . Fibromyalgia    skin cancer  . GERD (gastroesophageal reflux disease)   . Hypertension 12-07-11   presently no meds  . Normal cardiac stress test 06/2009  .  Skin cancer    melanoma.  face 2014  . TIA (transient ischemic attack) ?    Past Surgical History:  Procedure Laterality Date  . ABDOMINAL HYSTERECTOMY    . BACK SURGERY  12-07-11   2'10 L5 x2  . COLONOSCOPY WITH PROPOFOL N/A 06/03/2014   Procedure: COLONOSCOPY WITH PROPOFOL; IN CECUM AT 0750; TOTAL WITHDRAWAL TIME 16 MINUTES;  Surgeon: Rogene Houston, MD;  Location: [facility];  Service: Endoscopy;  Laterality: N/A;  . left ankle surgery     tendonitis  . LUMBAR LAMINECTOMY/DECOMPRESSION MICRODISCECTOMY  12/11/2011   Procedure: LUMBAR LAMINECTOMY/DECOMPRESSION MICRODISCECTOMY;  Surgeon: Tobi Bastos, MD;  Location: [facility];  Service: Orthopedics;  Laterality: Left;  Decompressive Lumbar Laminectomy L5-S1 on Left  . POLYPECTOMY N/A 06/03/2014   Procedure: POLYPECTOMY;  Surgeon: Rogene Houston, MD;  Location: [facility];  Service: Endoscopy;  Laterality: N/A;  . right wrist surgery for pinched nerve    . trigger finger surgery  12-07-11   rt. middle trigger finger release    Family History  Problem Relation Age of Onset  . Cancer Mother        uterine  . Cancer Father        lung    Social History   Socioeconomic History  . Marital status: Single    Spouse name: Not on file  . Number of children: Not on file  . Years of education: Not  on file  . Highest education level: Not on file  Occupational History  . Not on file  Tobacco Use  . Smoking status: Current Every Day Smoker    Packs/day: 2.00    Years: 48.00    Pack years: 96.00    Types: Cigarettes    Start date: 01/07/1970  . Smokeless tobacco: Never Used  . Tobacco comment: gone down to 1 pack per day   Vaping Use  . Vaping Use: Never used  Substance and Sexual Activity  . Alcohol use: No    Alcohol/week: 0.0 standard drinks  . Drug use: No  . Sexual activity: Yes    Birth control/protection: Surgical  Other Topics Concern  . Not on file  Social History Narrative  . Not on file   Social Determinants of Health    Financial Resource Strain: Medium Risk  . Difficulty of Paying Living Expenses: Somewhat hard  Food Insecurity: Not on file  Transportation Needs: Not on file  Physical Activity: Not on file  Stress: Not on file  Social Connections: Not on file  Intimate Partner Violence: Not on file    ROS Review of Systems  Constitutional: Negative for chills and fever.  HENT: Negative for congestion, sinus pressure, sinus pain and sore throat.   Eyes: Negative for pain and discharge.  Respiratory: Negative for cough and shortness of breath.   Cardiovascular: Negative for chest pain and palpitations.  Gastrointestinal: Negative for abdominal pain, constipation, diarrhea, nausea and vomiting.  Endocrine: Negative for polydipsia and polyuria.  Genitourinary: Negative for dysuria and hematuria.  Musculoskeletal: Positive for arthralgias and back pain. Negative for neck pain and neck stiffness.  Skin: Negative for rash.  Neurological: Positive for headaches. Negative for dizziness, syncope and weakness.  Psychiatric/Behavioral: Positive for sleep disturbance. Negative for agitation and behavioral problems. The patient is not nervous/anxious.     Objective:   Today's Vitals: BP (!) 192/98 (BP Location: Left Arm, Patient Position: Sitting)   Pulse 77   Temp 98.5 F (36.9 C) (Oral)   Resp 18   Ht 5\' 5"  (1.651 m)   Wt 197 lb 6.4 oz (89.5 kg)   SpO2 96%   BMI 32.85 kg/m   Physical Exam Vitals reviewed.  Constitutional:      General: She is not in acute distress.    Appearance: She is not diaphoretic.  HENT:     Head: Normocephalic and atraumatic.     Nose: Nose normal.     Mouth/Throat:     Mouth: Mucous membranes are moist.  Eyes:     General: No scleral icterus.    Extraocular Movements: Extraocular movements intact.     Pupils: Pupils are equal, round, and reactive to light.  Cardiovascular:     Rate and Rhythm: Normal rate and regular rhythm.     Pulses: Normal pulses.      Heart sounds: Murmur (Systolic, right upper sternal border) heard.    Pulmonary:     Breath sounds: Normal breath sounds. No wheezing or rales.  Abdominal:     Palpations: Abdomen is soft.     Tenderness: There is no abdominal tenderness.  Musculoskeletal:     Cervical back: Neck supple. No tenderness.     Right lower leg: No edema.     Left lower leg: No edema.  Skin:    General: Skin is warm.     Findings: No rash.  Neurological:     General: No focal deficit present.  Mental Status: She is alert and oriented to person, place, and time.  Psychiatric:        Mood and Affect: Mood normal.        Behavior: Behavior normal.     Assessment & Plan:   Problem List Items Addressed This Visit      Cardiovascular and Mediastinum   Hypertensive urgency    BP Readings from Last 1 Encounters:  05/19/20 (!) 192/98   Uncontrolled due to noncompliance Unsure of home medications According to chart review, she should be on Amlodipine 10 mg QD, Hydralazine 100 mg TID, Labetalol 200 mg BID and Lasix 40 mg PRN for leg swelling Sent Amlodipine and Labetalol to local pharmacy for now Counseled for compliance with the medications Advised DASH diet and moderate exercise/walking as tolerated Follows up with Cardiologist      Relevant Medications   amLODipine (NORVASC) 10 MG tablet   labetalol (NORMODYNE) 200 MG tablet   Paroxysmal atrial flutter (HCC)    Currently in sinus rhythm Chart review suggests that she should be on labetalol for rate control and HTN On Eliquis      Relevant Medications   amLODipine (NORVASC) 10 MG tablet   labetalol (NORMODYNE) 200 MG tablet     Respiratory   COPD (chronic obstructive pulmonary disease) ([place])    Well-controlled with Albuterol PRN        Digestive   GERD (gastroesophageal reflux disease)    On Dexilant.        Nervous and Auditory   Peripheral neuropathy   Relevant Medications   DULoxetine (CYMBALTA) 30 MG capsule      Genitourinary   CKD (chronic kidney disease) stage 3, GFR 30-59 ml/min (HCC)    Likely due to uncontrolled HTN Avoid nephrotoxic agents including NSAIDs Will check BMP later and decide whether she needs Nephrology referral        Other   Spinal stenosis, lumbar region, with neurogenic claudication    S/p lumbar spine surgeries Follows up with Dr [name] for pain management - on Norco PRN, takes about twice daily      Relevant Medications   DULoxetine (CYMBALTA) 30 MG capsule   Chronic insomnia    On Trazodone 150 mg QD      Encounter to establish care - Primary    Care established Previous chart reviewed History and medications reviewed with the patient       Other Visit Diagnoses    Influenza vaccine refused          Outpatient Encounter Medications as of 05/19/2020  Medication Sig  . albuterol (PROVENTIL) (2.5 MG/3ML) 0.083% nebulizer solution INHALE THE CONTENTS OF 1 VIAL VIA NEBULIZER EVERY 4 HOURS AS NEEDED FOR WHEEZING OR SHORTNESS OF BREATH  . albuterol (VENTOLIN HFA) 108 (90 Base) MCG/ACT inhaler INHALE 2 PUFFS EVERY 4 HOURS AS NEEDED FOR WHEEZING OR SHORTNESS OF BREATH (Patient taking differently: Inhale 2 puffs into the lungs every 4 (four) hours as needed for wheezing or shortness of breath.)  . apixaban (ELIQUIS) 5 MG TABS tablet Take 1 tablet (5 mg total) by mouth 2 (two) times daily.  Marland Kitchen atorvastatin (LIPITOR) 10 MG tablet TAKE 1 TABLET BY MOUTH ONCE DAILY AT 6:00 PM. (Patient taking differently: Take 10 mg by mouth daily. TAKE 1 TABLET BY MOUTH ONCE DAILY AT 6:00 PM.)  . desmopressin (DDAVP) 0.1 MG tablet TAKE 1 TABLET AT BEDTIME FOR BLADDER (Patient taking differently: Take 0.1 mg by mouth at bedtime.)  . DEXILANT 60  MG capsule TAKE 1 CAPSULE BY MOUTH DAILY.  . DULoxetine (CYMBALTA) 30 MG capsule Cymbalta 30 mg capsule,delayed release  Take 2 capsules every day by oral route for 30 days.  Regino Schultze TEST test strip USE TO TEST ONCE DAILY.  Marland Kitchen Fluocinolone  Acetonide Body (DERMA-SMOOTHE/FS BODY) 0.01 % OIL Apply to scalp daily a three times a week  . furosemide (LASIX) 40 MG tablet TAKE 1 TABLET TWICE DAILY (Patient taking differently: Take 40 mg by mouth 2 (two) times daily as needed.)  . hydrALAZINE (APRESOLINE) 50 MG tablet Take 2 tablets (100 mg total) by mouth 3 (three) times daily.  Marland Kitchen HYDROmorphone (DILAUDID) 2 MG tablet Take 1 mg by mouth 2 (two) times daily.   . IBU 600 MG tablet TAKE 1 TABLET TWICE DAILY AS NEEDED  . nitroGLYCERIN (NITROSTAT) 0.4 MG SL tablet Place 1 tablet (0.4 mg total) under the tongue every 5 (five) minutes as needed for chest pain.  Marland Kitchen nystatin (MYCOSTATIN/NYSTOP) powder Apply topically 2 (two) times daily as needed.  . traZODone (DESYREL) 150 MG tablet TAKE 1 TABLET AT BEDTIME  . [DISCONTINUED] amLODipine (NORVASC) 10 MG tablet Take 1 tablet (10 mg total) by mouth daily.  . [DISCONTINUED] labetalol (NORMODYNE) 200 MG tablet Take 1 tablet (200 mg total) by mouth 2 (two) times daily.  Marland Kitchen amLODipine (NORVASC) 10 MG tablet Take 1 tablet (10 mg total) by mouth daily.  Marland Kitchen labetalol (NORMODYNE) 200 MG tablet Take 1 tablet (200 mg total) by mouth 2 (two) times daily.   No facility-administered encounter medications on file as of 05/19/2020.    Follow-up: Return in about 2 weeks (around 06/02/2020) for HTN and medication review.   Lindell Spar, MD

## 2020-05-19 NOTE — Assessment & Plan Note (Signed)
Likely due to uncontrolled HTN Avoid nephrotoxic agents including NSAIDs Will check BMP later and decide whether she needs Nephrology referral

## 2020-05-19 NOTE — Assessment & Plan Note (Signed)
Well-controlled with Albuterol PRN 

## 2020-05-19 NOTE — Assessment & Plan Note (Signed)
On Dexilant 

## 2020-05-19 NOTE — Assessment & Plan Note (Signed)
Care established Previous chart reviewed History and medications reviewed with the patient 

## 2020-05-20 ENCOUNTER — Ambulatory Visit: Payer: Medicare HMO | Admitting: Family Medicine

## 2020-05-24 ENCOUNTER — Other Ambulatory Visit: Payer: Self-pay | Admitting: Internal Medicine

## 2020-05-27 DIAGNOSIS — M79676 Pain in unspecified toe(s): Secondary | ICD-10-CM | POA: Diagnosis not present

## 2020-05-27 DIAGNOSIS — L84 Corns and callosities: Secondary | ICD-10-CM | POA: Diagnosis not present

## 2020-05-27 DIAGNOSIS — I70203 Unspecified atherosclerosis of native arteries of extremities, bilateral legs: Secondary | ICD-10-CM | POA: Diagnosis not present

## 2020-05-27 DIAGNOSIS — B351 Tinea unguium: Secondary | ICD-10-CM | POA: Diagnosis not present

## 2020-06-02 ENCOUNTER — Ambulatory Visit: Payer: Medicare HMO | Admitting: Internal Medicine

## 2020-06-07 ENCOUNTER — Other Ambulatory Visit: Payer: Self-pay

## 2020-06-07 ENCOUNTER — Ambulatory Visit: Payer: Medicare HMO | Admitting: Internal Medicine

## 2020-06-07 ENCOUNTER — Encounter: Payer: Self-pay | Admitting: Internal Medicine

## 2020-06-07 VITALS — BP 156/72 | HR 65 | Ht 65.0 in | Wt 192.8 lb

## 2020-06-07 DIAGNOSIS — I10 Essential (primary) hypertension: Secondary | ICD-10-CM | POA: Diagnosis not present

## 2020-06-07 DIAGNOSIS — D6869 Other thrombophilia: Secondary | ICD-10-CM | POA: Diagnosis not present

## 2020-06-07 DIAGNOSIS — R55 Syncope and collapse: Secondary | ICD-10-CM

## 2020-06-07 DIAGNOSIS — I4892 Unspecified atrial flutter: Secondary | ICD-10-CM | POA: Diagnosis not present
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
## 2020-06-07 NOTE — Patient Instructions (Addendum)
Medication Instructions:  Your physician recommends that you continue on your current medications as directed. Please refer to the Current Medication list given to you today.  Labwork: None ordered.  Testing/Procedures: None ordered.  Follow-Up: Your physician wants you to follow-up as need with Dr. Rayann Heman. Please call office if you wish to proceed with the loop recorder.   Any Other Special Instructions Will Be Listed Below (If Applicable).  If you need a refill on your cardiac medications before your next appointment, please call your pharmacy.    Implantable Loop Recorder Placement  An implantable loop recorder is a small electronic device that is placed under the skin of your chest. The device records the electrical activity of your heart over a long period of time. Your health care provider can download these recordings to monitor your heart. You may need an implantable loop recorder if you have periods of abnormal heart activity (arrhythmias) or unexplained fainting (syncope). The recorder can be left in place for 1 year or longer. Tell a health care provider about:  Any allergies you have.  All medicines you are taking, including vitamins, herbs, eye drops, creams, and over-the-counter medicines.  Any problems you or family members have had with anesthetic medicines.  Any blood disorders you have.  Any surgeries you have had.  Any medical conditions you have.  Whether you are pregnant or may be pregnant. What are the risks? Generally, this is a safe procedure. However, problems may occur, including:  Infection.  Bleeding.  Allergic reactions to anesthetic medicines.  Damage to nerves or blood vessels.  Failure of the device to work. This could require another surgery to replace it. What happens before the procedure?  You may have a physical exam, blood tests, and imaging tests of your heart, such as a chest X-ray.  Follow instructions from your health care  provider about eating or drinking restrictions.  Ask your health care provider about: ? Changing or stopping your regular medicines. This is especially important if you are taking diabetes medicines or blood thinners. ? Taking medicines such as aspirin and ibuprofen. These medicines can thin your blood. Do not take these medicines unless your health care provider tells you to take them. ? Taking over-the-counter medicines, vitamins, herbs, and supplements.  Ask your health care provider how your surgical site will be marked or identified.  Ask your health care provider what steps will be taken to help prevent infection. These may include: ? Removing hair at the surgery site. ? Washing skin with a germ-killing soap.  Plan to have someone take you home from the hospital or clinic.  Plan to have a responsible adult care for you for at least 24 hours after you leave the hospital or clinic. This is important.  Do not use any products that contain nicotine or tobacco, such as cigarettes and e-cigarettes. If you need help quitting, ask your health care provider.   What happens during the procedure?  An IV will be inserted into one of your veins.  You may be given one or more of the following: ? A medicine to help you relax (sedative). ? A medicine to numb the area (local anesthetic).  A small incision will be made on the left side of your upper chest.  A pocket will be created under your skin.  The device will be placed in the pocket.  The incision will be closed with stitches (sutures) or adhesive strips.  A bandage (dressing) will be placed over the  incision. The procedure may vary among health care providers and hospitals. What happens after the procedure?  Your blood pressure, heart rate, breathing rate, and blood oxygen level will be monitored until you leave the hospital or clinic.  You may be able to go home on the day of your surgery. Before you go home: ? Your health care  provider will program your recorder. ? You will learn how to trigger your device with a handheld activator. ? You will learn how to send recordings to your health care provider. ? You will get an ID card for your device, and you will be told when to use it.  Do not drive for 24 hours if you were given a sedative during your procedure. Summary  An implantable loop recorder is a small electronic device that is placed under the skin of your chest to monitor your heart over a long period of time.  The recorder can be left in place for 1 year or longer.  Plan to have someone take you home from the hospital or clinic. This information is not intended to replace advice given to you by your health care provider. Make sure you discuss any questions you have with your health care provider. Document Revised: 11/25/2019 Document Reviewed: 04/21/2017 Elsevier Patient Education  2021 [employer].

## 2020-06-14 ENCOUNTER — Other Ambulatory Visit: Payer: Self-pay

## 2020-06-14 ENCOUNTER — Ambulatory Visit (INDEPENDENT_AMBULATORY_CARE_PROVIDER_SITE_OTHER): Payer: Medicare HMO | Admitting: Internal Medicine

## 2020-06-14 ENCOUNTER — Encounter: Payer: Self-pay | Admitting: Internal Medicine

## 2020-06-14 VITALS — BP 148/68 | HR 72 | Resp 18 | Ht 65.0 in | Wt 196.4 lb

## 2020-06-14 DIAGNOSIS — I4892 Unspecified atrial flutter: Secondary | ICD-10-CM | POA: Diagnosis not present

## 2020-06-14 DIAGNOSIS — G8929 Other chronic pain: Secondary | ICD-10-CM

## 2020-06-14 DIAGNOSIS — I10 Essential (primary) hypertension: Secondary | ICD-10-CM

## 2020-06-14 DIAGNOSIS — R252 Cramp and spasm: Secondary | ICD-10-CM | POA: Diagnosis not present

## 2020-06-14 DIAGNOSIS — M545 Low back pain, unspecified: Secondary | ICD-10-CM

## 2020-06-14 MED ORDER — LABETALOL HCL 200 MG PO TABS
200.0000 mg | ORAL_TABLET | Freq: Two times a day (BID) | ORAL | 1 refills | Status: DC
Start: 2020-06-14 — End: 2022-04-20

## 2020-06-14 MED ORDER — AMLODIPINE BESYLATE 10 MG PO TABS: 10.0000 mg | ORAL_TABLET | Freq: Every day | ORAL | 1 refills | Status: DC

## 2020-06-14 MED ORDER — MAGNESIUM 400 MG PO CAPS: 1.0000 | ORAL_CAPSULE | Freq: Every day | ORAL | 1 refills | Status: DC

## 2020-06-14 NOTE — Assessment & Plan Note (Signed)
S/p lumbar spine surgeries Follows up with Dr [name] for pain management - on Norco PRN, takes about twice daily

## 2020-06-14 NOTE — Progress Notes (Signed)
Established Patient Office Visit  Subjective:  Patient ID: Paula Long, female    DOB: Aug 01, 1950  Age: 70 y.o. MRN: [id number]  CC:  Chief Complaint  Patient presents with  . 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  She has brought her home medications for review. Her BP is still elevated, but significantly improved than prior. She denies any dizziness, chest pain, dyspnea or palpitations. Of note, she continues to have chronic back pain, which could be contributing to elevated BP.  She also c/o muscle cramps of the back and abdominal muscle wall. Denies any nausea, vomiting, or diarrhea. Denies melena or hematochezia. Denies any recent injury.  Past Medical History:  Diagnosis Date  . Anxiety   . Arthritis 12-07-11   arthritis,DDD,spinal stenosis  . Chronic back pain   . COPD (chronic obstructive pulmonary disease) ([place]) 12-07-11   pt. uses nebulizer as needed and inhaler daily  . Fibromyalgia    skin cancer  . GERD (gastroesophageal reflux disease)   . Hypertension 12-07-11   presently no meds  . Normal cardiac stress test 06/2009  . Skin cancer    melanoma.  face 2014  . TIA (transient ischemic attack) ?    Past Surgical History:  Procedure Laterality Date  . ABDOMINAL HYSTERECTOMY    . BACK SURGERY  12-07-11   2'10 L5 x2  . COLONOSCOPY WITH PROPOFOL N/A 06/03/2014   Procedure: COLONOSCOPY WITH PROPOFOL; IN CECUM AT 0750; TOTAL WITHDRAWAL TIME 16 MINUTES;  Surgeon: Rogene Houston, MD;  Location: [facility];  Service: Endoscopy;  Laterality: N/A;  . left ankle surgery     tendonitis  . LUMBAR LAMINECTOMY/DECOMPRESSION MICRODISCECTOMY  12/11/2011   Procedure: LUMBAR  LAMINECTOMY/DECOMPRESSION MICRODISCECTOMY;  Surgeon: Tobi Bastos, MD;  Location: [facility];  Service: Orthopedics;  Laterality: Left;  Decompressive Lumbar Laminectomy L5-S1 on Left  . POLYPECTOMY N/A 06/03/2014   Procedure: POLYPECTOMY;  Surgeon: Rogene Houston, MD;  Location: [facility];  Service: Endoscopy;  Laterality: N/A;  . right wrist surgery for pinched nerve    . trigger finger surgery  12-07-11   rt. middle trigger finger release    Family History  Problem Relation Age of Onset  . Cancer Mother        uterine  . Cancer Father        lung    Social History   Socioeconomic History  . Marital status: Single    Spouse name: Not on file  . Number of children: Not on file  . Years of education: Not on file  . Highest education level: Not on file  Occupational History  . Not on file  Tobacco Use  . Smoking status: Current Every Day Smoker    Packs/day: 2.00    Years: 48.00    Pack years: 96.00    Types: Cigarettes    Start date: 01/07/1970  . Smokeless tobacco: Never Used  . Tobacco comment: gone down to 1 pack per day   Vaping Use  . Vaping Use: Never used  Substance and Sexual Activity  . Alcohol use: No    Alcohol/week: 0.0 standard drinks  . Drug use: No  .  Sexual activity: Yes    Birth control/protection: Surgical  Other Topics Concern  . Not on file  Social History Narrative  . Not on file   Social Determinants of Health   Financial Resource Strain: Medium Risk  . Difficulty of Paying Living Expenses: Somewhat hard  Food Insecurity: Not on file  Transportation Needs: Not on file  Physical Activity: Not on file  Stress: Not on file  Social Connections: Not on file  Intimate Partner Violence: Not on file    Outpatient Medications Prior to Visit  Medication Sig Dispense Refill  . albuterol (PROVENTIL) (2.5 MG/3ML) 0.083% nebulizer solution INHALE THE CONTENTS OF 1 VIAL VIA NEBULIZER EVERY 4 HOURS AS NEEDED FOR WHEEZING OR SHORTNESS OF BREATH 90 mL 1   . albuterol (VENTOLIN HFA) 108 (90 Base) MCG/ACT inhaler INHALE 2 PUFFS EVERY 4 HOURS AS NEEDED FOR WHEEZING OR SHORTNESS OF BREATH 18 g 2  . atorvastatin (LIPITOR) 10 MG tablet TAKE 1 TABLET BY MOUTH ONCE DAILY AT 6:00 PM. 90 tablet 3  . desmopressin (DDAVP) 0.1 MG tablet TAKE 1 TABLET AT BEDTIME FOR BLADDER 90 tablet 3  . DEXILANT 60 MG capsule TAKE 1 CAPSULE BY MOUTH DAILY. 90 capsule 3  . DULoxetine (CYMBALTA) 30 MG capsule Cymbalta 30 mg capsule,delayed release  Take 2 capsules every day by oral route for 30 days.    Regino Schultze TEST test strip USE TO TEST ONCE DAILY. 100 each 3  . ELIQUIS 5 MG TABS tablet TAKE (1) TABLET BY MOUTH TWICE DAILY. 60 tablet 0  . furosemide (LASIX) 40 MG tablet TAKE 1 TABLET TWICE DAILY 180 tablet 1  . hydrALAZINE (APRESOLINE) 50 MG tablet Take 2 tablets (100 mg total) by mouth 3 (three) times daily. 540 tablet 3  . HYDROmorphone (DILAUDID) 2 MG tablet Take 1 mg by mouth 2 (two) times daily.     . IBU 600 MG tablet TAKE 1 TABLET TWICE DAILY AS NEEDED 180 tablet 1  . nitroGLYCERIN (NITROSTAT) 0.4 MG SL tablet Place 1 tablet (0.4 mg total) under the tongue every 5 (five) minutes as needed for chest pain. 25 tablet 3  . nystatin (MYCOSTATIN/NYSTOP) powder Apply topically 2 (two) times daily as needed. 60 g 2  . traZODone (DESYREL) 150 MG tablet TAKE 1 TABLET AT BEDTIME 90 tablet 3  . amLODipine (NORVASC) 10 MG tablet Take 1 tablet (10 mg total) by mouth daily. 30 tablet 0  . labetalol (NORMODYNE) 200 MG tablet Take 1 tablet (200 mg total) by mouth 2 (two) times daily. 60 tablet 0  . Fluocinolone Acetonide Body (DERMA-SMOOTHE/FS BODY) 0.01 % OIL Apply to scalp daily a three times a week (Patient not taking: Reported on 06/14/2020) 120 mL 1   No facility-administered medications prior to visit.    Allergies  Allergen Reactions  . Hydrocodone-Acetaminophen   . Oxycodone-Acetaminophen   . Sulfa Antibiotics   . Nystatin Rash    Oral rash  . Percocet  [Oxycodone-Acetaminophen] Rash    Oral rash  . Vesicare [Solifenacin] Rash  . Vicodin [Hydrocodone-Acetaminophen] Rash    Oral rash    ROS Review of Systems  Constitutional: Negative for chills and fever.  HENT: Negative for congestion, sinus pressure, sinus pain and sore throat.   Eyes: Negative for pain and discharge.  Respiratory: Negative for cough and shortness of breath.   Cardiovascular: Negative for chest pain and palpitations.  Gastrointestinal: Negative for abdominal pain, constipation, diarrhea, nausea and vomiting.  Endocrine: Negative for polydipsia and polyuria.  Genitourinary: Negative for dysuria and hematuria.  Musculoskeletal: Positive for arthralgias and back pain. Negative for neck pain and neck stiffness.  Skin: Negative for rash.  Neurological: Negative for dizziness, syncope, weakness and headaches.  Psychiatric/Behavioral: Positive for sleep disturbance. Negative for agitation and behavioral problems. The patient is not nervous/anxious.       Objective:    Physical Exam Vitals reviewed.  Constitutional:      General: She is not in acute distress.    Appearance: She is not diaphoretic.  HENT:     Head: Normocephalic and atraumatic.     Nose: Nose normal.     Mouth/Throat:     Mouth: Mucous membranes are moist.  Eyes:     General: No scleral icterus.    Extraocular Movements: Extraocular movements intact.     Pupils: Pupils are equal, round, and reactive to light.  Cardiovascular:     Rate and Rhythm: Normal rate and regular rhythm.     Pulses: Normal pulses.     Heart sounds: Murmur (Systolic, right upper sternal border) heard.    Pulmonary:     Breath sounds: Normal breath sounds. No wheezing or rales.  Abdominal:     Palpations: Abdomen is soft.     Tenderness: There is no abdominal tenderness.  Musculoskeletal:     Cervical back: Neck supple. No tenderness.     Right lower leg: No edema.     Left lower leg: No edema.  Skin:    General:  Skin is warm.     Findings: No rash.  Neurological:     General: No focal deficit present.     Mental Status: She is alert and oriented to person, place, and time.  Psychiatric:        Mood and Affect: Mood normal.        Behavior: Behavior normal.     BP (!) 148/68 (BP Location: Right Arm, Patient Position: Sitting, Cuff Size: Normal)   Pulse 72   Resp 18   Ht 5\' 5"  (1.651 m)   Wt 196 lb 6.4 oz (89.1 kg)   SpO2 97%   BMI 32.68 kg/m  Wt Readings from Last 3 Encounters:  06/14/20 196 lb 6.4 oz (89.1 kg)  06/07/20 192 lb 12.8 oz (87.5 kg)  05/19/20 197 lb 6.4 oz (89.5 kg)     Health Maintenance Due  Topic Date Due  . TETANUS/TDAP  03/20/2018  . COVID-19 Vaccine (3 - Booster for Moderna series) 03/09/2020  . OPHTHALMOLOGY EXAM  05/20/2020    There are no preventive care reminders to display for this patient.  Lab Results  Component Value Date   TSH 1.571 09/30/2018   Lab Results  Component Value Date   WBC 15.5 (H) 03/09/2020   HGB 12.8 03/09/2020   HCT 39.8 03/09/2020   MCV 95.2 03/09/2020   PLT 211 03/09/2020   Lab Results  Component Value Date   NA 133 (L) 03/09/2020   K 4.2 03/09/2020   CO2 21 (L) 03/09/2020   GLUCOSE 115 (H) 03/09/2020   BUN 21 03/09/2020   CREATININE 1.42 (H) 03/09/2020   BILITOT 0.4 03/09/2020   ALKPHOS 59 03/09/2020   AST 19 03/09/2020   ALT 14 03/09/2020   PROT 7.2 03/09/2020   ALBUMIN 4.0 03/09/2020   CALCIUM 8.9 03/09/2020   ANIONGAP 12 03/09/2020   Lab Results  Component Value Date   CHOL 112 08/13/2019   Lab Results  Component Value Date   HDL 56  08/13/2019   Lab Results  Component Value Date   LDLCALC 45 08/13/2019   Lab Results  Component Value Date   TRIG 40 08/13/2019   Lab Results  Component Value Date   CHOLHDL 2.0 08/13/2019   Lab Results  Component Value Date   HGBA1C 5.4 02/18/2020      Assessment & Plan:   Problem List Items Addressed This Visit      Cardiovascular and Mediastinum    Essential hypertension - Primary    BP Readings from Last 1 Encounters:  06/14/20 (!) 148/68   Uncontrolled due to noncompliance BP at home around 130s/70s On Amlodipine 10 mg QD, Hydralazine 100 mg TID, Labetalol 200 mg BID and Lasix 40 mg PRN for leg swelling Counseled for compliance with the medications Advised DASH diet and moderate exercise/walking as tolerated Follows up with Cardiologist      Relevant Medications   amLODipine (NORVASC) 10 MG tablet   labetalol (NORMODYNE) 200 MG tablet   Paroxysmal atrial flutter ([facility])    Currently in sinus rhythm On B-blocker On Eliquis      Relevant Medications   amLODipine (NORVASC) 10 MG tablet   labetalol (NORMODYNE) 200 MG tablet   Other Relevant Orders   CBC   Basic Metabolic Panel (BMET)   Lipid Profile     Other   Chronic back pain    S/p lumbar spine surgeries Follows up with Dr [name] for pain management - on Norco PRN, takes about twice daily       Other Visit Diagnoses    Muscle cramps       Relevant Medications   Magnesium 400 MG CAPS   Other Relevant Orders   Magnesium      Meds ordered this encounter  Medications  . amLODipine (NORVASC) 10 MG tablet    Sig: Take 1 tablet (10 mg total) by mouth daily.    Dispense:  90 tablet    Refill:  1  . labetalol (NORMODYNE) 200 MG tablet    Sig: Take 1 tablet (200 mg total) by mouth 2 (two) times daily.    Dispense:  180 tablet    Refill:  1    07/03/2019 stop metoprolol  . Magnesium 400 MG CAPS    Sig: Take 1 capsule by mouth daily.    Dispense:  90 capsule    Refill:  1    Follow-up: Return in about 3 months (around 09/14/2020) for HTN, CKD and blood tests review.    Lindell Spar, MD

## 2020-06-14 NOTE — Assessment & Plan Note (Addendum)
BP Readings from Last 1 Encounters:  06/14/20 (!) 148/68   Uncontrolled due to noncompliance BP at home around 130s/70s On Amlodipine 10 mg QD, Hydralazine 100 mg TID, Labetalol 200 mg BID and Lasix 40 mg PRN for leg swelling Counseled for compliance with the medications Advised DASH diet and moderate exercise/walking as tolerated Follows up with Cardiologist

## 2020-06-14 NOTE — Patient Instructions (Signed)
Please continue to take medications as prescribed.  Please continue to follow low salt diet. Continue your efforts to cut down smoking.  Please get fasting blood tests done before the next visit.

## 2020-06-14 NOTE — Assessment & Plan Note (Signed)
Currently in sinus rhythm On B-blocker On Eliquis

## 2020-06-28 ENCOUNTER — Other Ambulatory Visit: Payer: Self-pay | Admitting: Cardiology

## 2020-07-02 ENCOUNTER — Telehealth: Payer: Self-pay

## 2020-07-12 ENCOUNTER — Other Ambulatory Visit: Payer: Self-pay | Admitting: *Deleted

## 2020-07-12 MED ORDER — ALBUTEROL SULFATE HFA 108 (90 BASE) MCG/ACT IN AERS: INHALATION_SPRAY | RESPIRATORY_TRACT | 2 refills | Status: DC

## 2020-07-24 ENCOUNTER — Other Ambulatory Visit: Payer: Self-pay | Admitting: Internal Medicine

## 2020-07-28 ENCOUNTER — Other Ambulatory Visit: Payer: Self-pay | Admitting: Internal Medicine

## 2020-07-28 ENCOUNTER — Telehealth: Payer: Self-pay

## 2020-07-28 DIAGNOSIS — N3281 Overactive bladder: Secondary | ICD-10-CM

## 2020-07-28 MED ORDER — DESMOPRESSIN ACETATE 0.1 MG PO TABS: ORAL_TABLET | ORAL | 3 refills | Status: DC

## 2020-07-28 NOTE — Telephone Encounter (Signed)
Patient called need med refill   desmopressin (DDAVP) 0.1 MG tablet   Send to [facility]

## 2020-07-28 NOTE — Telephone Encounter (Signed)
Can we refill this for pt? This was prescribed by Dr. Buelah Manis.

## 2020-07-28 NOTE — Telephone Encounter (Signed)
Refilled. Thanks!

## 2020-08-05 ENCOUNTER — Other Ambulatory Visit: Payer: Self-pay | Admitting: Cardiology

## 2020-08-10 DIAGNOSIS — Z79899 Other long term (current) drug therapy: Secondary | ICD-10-CM | POA: Diagnosis not present

## 2020-08-10 DIAGNOSIS — I10 Essential (primary) hypertension: Secondary | ICD-10-CM | POA: Diagnosis not present

## 2020-08-10 DIAGNOSIS — M5459 Other low back pain: Secondary | ICD-10-CM | POA: Diagnosis not present

## 2020-08-10 DIAGNOSIS — M461 Sacroiliitis, not elsewhere classified: Secondary | ICD-10-CM | POA: Diagnosis not present

## 2020-08-10 DIAGNOSIS — I6529 Occlusion and stenosis of unspecified carotid artery: Secondary | ICD-10-CM | POA: Diagnosis not present

## 2020-09-12 DIAGNOSIS — I6523 Occlusion and stenosis of bilateral carotid arteries: Secondary | ICD-10-CM | POA: Insufficient documentation

## 2020-09-12 DIAGNOSIS — I739 Peripheral vascular disease, unspecified: Secondary | ICD-10-CM | POA: Insufficient documentation
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
## 2020-09-13 ENCOUNTER — Ambulatory Visit: Payer: Medicare HMO | Admitting: Internal Medicine

## 2020-09-15 ENCOUNTER — Ambulatory Visit: Payer: Medicare HMO | Admitting: Cardiology

## 2020-09-21 ENCOUNTER — Ambulatory Visit: Payer: Medicare HMO | Admitting: Internal Medicine

## 2020-10-05 ENCOUNTER — Other Ambulatory Visit: Payer: Self-pay | Admitting: Internal Medicine

## 2020-10-06 ENCOUNTER — Other Ambulatory Visit: Payer: Self-pay | Admitting: *Deleted

## 2020-10-06 MED ORDER — IBUPROFEN 600 MG PO TABS: 600.0000 mg | ORAL_TABLET | Freq: Two times a day (BID) | ORAL | 1 refills | Status: DC | PRN

## 2020-10-12 ENCOUNTER — Other Ambulatory Visit: Payer: Self-pay

## 2020-10-12 ENCOUNTER — Ambulatory Visit (INDEPENDENT_AMBULATORY_CARE_PROVIDER_SITE_OTHER): Payer: Medicare HMO | Admitting: *Deleted

## 2020-10-12 DIAGNOSIS — Z0000 Encounter for general adult medical examination without abnormal findings: Secondary | ICD-10-CM | POA: Diagnosis not present

## 2020-10-12 NOTE — Progress Notes (Signed)
Subjective:   Paula Long is a 70 y.o. female who presents for Medicare Annual (Subsequent) preventive examination.  Review of Systems           Objective:    There were no vitals filed for this visit. There is no height or weight on file to calculate BMI.  Advanced Directives 03/09/2020 08/13/2019 08/05/2019 07/18/2019 07/18/2019 07/18/2019 10/21/2018  Does Patient Have a Medical Advance Directive? No No No No No No No  Would patient like information on creating a medical advance directive? - Yes (MAU/Ambulatory/Procedural Areas - Information given) - No - Patient declined No - Patient declined - No - Patient declined  Pre-existing out of facility DNR order (yellow form or pink MOST form) - - - - - - -    Current Medications (verified) Outpatient Encounter Medications as of 10/12/2020  Medication Sig   albuterol (PROVENTIL) (2.5 MG/3ML) 0.083% nebulizer solution INHALE THE CONTENTS OF 1 VIAL VIA NEBULIZER EVERY 4 HOURS AS NEEDED FOR WHEEZING OR SHORTNESS OF BREATH   albuterol (VENTOLIN HFA) 108 (90 Base) MCG/ACT inhaler INHALE 2 PUFFS EVERY 4 HOURS AS NEEDED FOR WHEEZING OR SHORTNESS OF BREATH   amLODipine (NORVASC) 10 MG tablet Take 1 tablet (10 mg total) by mouth daily.   atorvastatin (LIPITOR) 10 MG tablet TAKE 1 TABLET BY MOUTH ONCE DAILY AT 6:00 PM.   desmopressin (DDAVP) 0.1 MG tablet TAKE 1 TABLET AT BEDTIME FOR BLADDER   DEXILANT 60 MG capsule TAKE 1 CAPSULE BY MOUTH DAILY.   DULoxetine (CYMBALTA) 30 MG capsule Cymbalta 30 mg capsule,delayed release  Take 2 capsules every day by oral route for 30 days.   EASYMAX TEST test strip USE TO TEST ONCE DAILY.   ELIQUIS 5 MG TABS tablet TAKE (1) TABLET BY MOUTH TWICE DAILY.   Fluocinolone Acetonide Body (DERMA-SMOOTHE/FS BODY) 0.01 % OIL Apply to scalp daily a three times a week (Patient not taking: Reported on 06/14/2020)   furosemide (LASIX) 40 MG tablet TAKE 1 TABLET TWICE DAILY   hydrALAZINE (APRESOLINE) 50 MG tablet Take 2  tablets (100 mg total) by mouth 3 (three) times daily.   HYDROmorphone (DILAUDID) 2 MG tablet Take 1 mg by mouth 2 (two) times daily.    ibuprofen (IBU) 600 MG tablet Take 1 tablet (600 mg total) by mouth 2 (two) times daily as needed.   labetalol (NORMODYNE) 200 MG tablet Take 1 tablet (200 mg total) by mouth 2 (two) times daily.   Magnesium 400 MG CAPS Take 1 capsule by mouth daily.   nitroGLYCERIN (NITROSTAT) 0.4 MG SL tablet Place 1 tablet (0.4 mg total) under the tongue every 5 (five) minutes as needed for chest pain.   nystatin (MYCOSTATIN/NYSTOP) powder Apply topically 2 (two) times daily as needed.   traZODone (DESYREL) 150 MG tablet TAKE 1 TABLET AT BEDTIME   No facility-administered encounter medications on file as of 10/12/2020.    Allergies (verified) Hydrocodone-acetaminophen, Oxycodone-acetaminophen, Sulfa antibiotics, Nystatin, Percocet [oxycodone-acetaminophen], Vesicare [solifenacin], and Vicodin [hydrocodone-acetaminophen]   History: Past Medical History:  Diagnosis Date   Anxiety    Arthritis 12-07-11   arthritis,DDD,spinal stenosis   Chronic back pain    COPD (chronic obstructive pulmonary disease) ([place]) 12-07-11   pt. uses nebulizer as needed and inhaler daily   Fibromyalgia    skin cancer   GERD (gastroesophageal reflux disease)    Hypertension 12-07-11   presently no meds   Normal cardiac stress test 06/2009   Skin cancer    melanoma.  face 2014   TIA (transient ischemic attack) ?   Past Surgical History:  Procedure Laterality Date   ABDOMINAL HYSTERECTOMY     BACK SURGERY  12-07-11   2'10 L5 x2   COLONOSCOPY WITH PROPOFOL N/A 06/03/2014   Procedure: COLONOSCOPY WITH PROPOFOL; IN CECUM AT 0750; TOTAL WITHDRAWAL TIME 16 MINUTES;  Surgeon: Rogene Houston, MD;  Location: [facility];  Service: Endoscopy;  Laterality: N/A;   left ankle surgery     tendonitis   LUMBAR LAMINECTOMY/DECOMPRESSION MICRODISCECTOMY  12/11/2011   Procedure: LUMBAR LAMINECTOMY/DECOMPRESSION  MICRODISCECTOMY;  Surgeon: Tobi Bastos, MD;  Location: [facility];  Service: Orthopedics;  Laterality: Left;  Decompressive Lumbar Laminectomy L5-S1 on Left   POLYPECTOMY N/A 06/03/2014   Procedure: POLYPECTOMY;  Surgeon: Rogene Houston, MD;  Location: [facility];  Service: Endoscopy;  Laterality: N/A;   right wrist surgery for pinched nerve     trigger finger surgery  12-07-11   rt. middle trigger finger release   Family History  Problem Relation Age of Onset   Cancer Mother        uterine   Cancer Father        lung   Social History   Socioeconomic History   Marital status: Single    Spouse name: Not on file   Number of children: Not on file   Years of education: Not on file   Highest education level: Not on file  Occupational History   Not on file  Tobacco Use   Smoking status: Every Day    Packs/day: 2.00    Years: 48.00    Pack years: 96.00    Types: Cigarettes    Start date: 01/07/1970   Smokeless tobacco: Never   Tobacco comments:    gone down to 1 pack per day   Vaping Use   Vaping Use: Never used  Substance and Sexual Activity   Alcohol use: No    Alcohol/week: 0.0 standard drinks   Drug use: No   Sexual activity: Yes    Birth control/protection: Surgical  Other Topics Concern   Not on file  Social History Narrative   Not on file   Social Determinants of Health   Financial Resource Strain: Not on file  Food Insecurity: Not on file  Transportation Needs: Not on file  Physical Activity: Not on file  Stress: Not on file  Social Connections: Not on file    Tobacco Counseling Ready to quit: Not Answered Counseling given: Not Answered Tobacco comments: gone down to 1 pack per day    Clinical Intake:                 Diabetic?No         Activities of Daily Living No flowsheet data found.  Patient Care Team: Lindell Spar, MD as PCP - General (Internal Medicine) Minus Breeding, MD as PCP - Cardiology (Cardiology) Edythe Clarity, [facility] as Pharmacist (Pharmacist)  Indicate any recent Medical Services you may have received from other than [facility] providers in the past year (date may be approximate).     Assessment:   This is a routine wellness examination for Paula Long.  Hearing/Vision screen No results found.  Dietary issues and exercise activities discussed:     Goals Addressed   None   Depression Screen PHQ 2/9 Scores 06/14/2020 05/19/2020 02/18/2020 08/13/2019 09/04/2018 02/25/2018 04/30/2017  PHQ - 2 Score 0 '6 4 2 2 '$ 0 1  PHQ- 9 Score - 12 9 9  6 - -    Fall Risk Fall Risk  06/14/2020 05/19/2020 02/18/2020 08/13/2019 09/04/2018  Falls in the past year? 0 0 0 0 0  Number falls in past yr: 0 0 - - -  Injury with Fall? 0 0 - - -  Risk for fall due to : No Fall Risks Impaired balance/gait No Fall Risks No Fall Risks -  Follow up Falls evaluation completed Falls evaluation completed Falls evaluation completed Falls evaluation completed Falls evaluation completed    [place]:  Any stairs in or around the home? No  If so, are there any without handrails? No  Home free of loose throw rugs in walkways, pet beds, electrical cords, etc? Yes  Adequate lighting in your home to reduce risk of falls? Yes   ASSISTIVE DEVICES UTILIZED TO PREVENT FALLS:  Life alert? No  Use of a cane, walker or w/c? Yes  Grab bars in the bathroom? Yes  Shower chair or bench in shower? No  Elevated toilet seat or a handicapped toilet? No   TIMED UP AND GO:  Was the test performed? No .  Length of time to ambulate 10 feet: NA sec.     Cognitive Function:        Immunizations Immunization History  Administered Date(s) Administered   Moderna Sars-Covid-2 Vaccination 08/13/2019, 09/08/2019   Pneumococcal Conjugate-13 02/23/2017   Pneumococcal Polysaccharide-23 02/26/2013, 02/25/2018    TDAP status: Up to date  Flu Vaccine status: Declined, Education has been provided regarding the importance of  this vaccine but patient still declined. Advised may receive this vaccine at local pharmacy or Health Dept. Aware to provide a copy of the vaccination record if obtained from local pharmacy or Health Dept. Verbalized acceptance and understanding.  Pneumococcal vaccine status: Up to date  Covid-19 vaccine status: Completed vaccines  Qualifies for Shingles Vaccine? Yes   Zostavax completed No   Shingrix Completed?: No.    Education has been provided regarding the importance of this vaccine. Patient has been advised to call insurance company to determine out of pocket expense if they have not yet received this vaccine. Advised may also receive vaccine at local pharmacy or Health Dept. Verbalized acceptance and understanding.  Screening Tests Health Maintenance  Topic Date Due   Zoster Vaccines- Shingrix (1 of 2) Never done   TETANUS/TDAP  03/20/2018   COVID-19 Vaccine (3 - Moderna risk series) 10/06/2019   OPHTHALMOLOGY EXAM  05/20/2020   FOOT EXAM  08/12/2020   HEMOGLOBIN A1C  08/18/2020   INFLUENZA VACCINE  03/19/2099 (Originally 10/18/2020)   MAMMOGRAM  02/11/2021   COLONOSCOPY (Pts 45-56yr Insurance coverage will need to be confirmed)  06/02/2024   DEXA SCAN  Completed   Hepatitis C Screening  Completed   PNA vac Low Risk Adult  Completed   HPV VACCINES  Aged Out    Health Maintenance  Health Maintenance Due  Topic Date Due   Zoster Vaccines- Shingrix (1 of 2) Never done   TETANUS/TDAP  03/20/2018   COVID-19 Vaccine (3 - Moderna risk series) 10/06/2019   OPHTHALMOLOGY EXAM  05/20/2020   FOOT EXAM  08/12/2020   HEMOGLOBIN A1C  08/18/2020    Colorectal cancer screening: Type of screening: Colonoscopy. Completed 06-03-14. Repeat every 10 years  Mammogram status: Completed 02-12-19. Repeat every year  Bone Density status: Completed 03-06-17. Results reflect: Bone density results: OSTEOPENIA. Repeat every 5 years.  Lung Cancer Screening: (Low Dose CT Chest recommended if Age  55-80 years, 30 pack-year currently smoking OR have quit w/in 15years.) does qualify.   Lung Cancer Screening Referral: No would like to wait  Additional Screening:  Hepatitis C Screening: does qualify; Completed 12-25-14 Vision Screening: Recommended annual ophthalmology exams for early detection of glaucoma and other disorders of the eye. Is the patient up to date with their annual eye exam?  No  Who is the provider or what is the name of the office in which the patient attends annual eye exams? [facility]  If pt is not established with a provider, would they like to be referred to a provider to establish care? No .   Dental Screening: Recommended annual dental exams for proper oral hygiene  Community Resource Referral / Chronic Care Management: CRR required this visit?  No   CCM required this visit?  No      Plan:     I have personally reviewed and noted the following in the patient's chart:   Medical and social history Use of alcohol, tobacco or illicit drugs  Current medications and supplements including opioid prescriptions.  Functional ability and status Nutritional status Physical activity Advanced directives List of other physicians Hospitalizations, surgeries, and ER visits in previous 12 months Vitals Screenings to include cognitive, depression, and falls Referrals and appointments  In addition, I have reviewed and discussed with patient certain preventive protocols, quality metrics, and best practice recommendations. A written personalized care plan for preventive services as well as general preventive health recommendations were provided to patient.     Shelda Altes, CMA   10/12/2020   Nurse Notes: Telehealth, Spent 40 mins talking to patient

## 2020-10-12 NOTE — Patient Instructions (Signed)
Paula Long , Thank you for taking time to come for your Medicare Wellness Visit. I appreciate your ongoing commitment to your health goals. Please review the following plan we discussed and let me know if I can assist you in the future.   Screening recommendations/referrals: Colonoscopy: Completed Due 06-02-24 Mammogram: Due now patient would like to wait  Bone Density: Completed Due 03-06-22 Recommended yearly ophthalmology/optometry visit for glaucoma screening and checkup Recommended yearly dental visit for hygiene and checkup  Vaccinations: Influenza vaccine: Due 10-18-20  Pneumococcal vaccine: Completed Tdap vaccine: Due now  Shingles vaccine: Due now     Advanced directives: Patient declined information  Conditions/risks identified: Hypertension  Next appointment: 1 year    Preventive Care 66 Years and Older, Female Preventive care refers to lifestyle choices and visits with your health care provider that can promote health and wellness. What does preventive care include? A yearly physical exam. This is also called an annual well check. Dental exams once or twice a year. Routine eye exams. Ask your health care provider how often you should have your eyes checked. Personal lifestyle choices, including: Daily care of your teeth and gums. Regular physical activity. Eating a healthy diet. Avoiding tobacco and drug use. Limiting alcohol use. Practicing safe sex. Taking low-dose aspirin every day. Taking vitamin and mineral supplements as recommended by your health care provider. What happens during an annual well check? The services and screenings done by your health care provider during your annual well check will depend on your age, overall health, lifestyle risk factors, and family history of disease. Counseling  Your health care provider may ask you questions about your: Alcohol use. Tobacco use. Drug use. Emotional well-being. Home and relationship well-being. Sexual  activity. Eating habits. History of falls. Memory and ability to understand (cognition). Work and work Statistician. Reproductive health. Screening  You may have the following tests or measurements: Height, weight, and BMI. Blood pressure. Lipid and cholesterol levels. These may be checked every 5 years, or more frequently if you are over 63 years old. Skin check. Lung cancer screening. You may have this screening every year starting at age 59 if you have a 30-pack-year history of smoking and currently smoke or have quit within the past 15 years. Fecal occult blood test (FOBT) of the stool. You may have this test every year starting at age 83. Flexible sigmoidoscopy or colonoscopy. You may have a sigmoidoscopy every 5 years or a colonoscopy every 10 years starting at age 63. Hepatitis C blood test. Hepatitis B blood test. Sexually transmitted disease (STD) testing. Diabetes screening. This is done by checking your blood sugar (glucose) after you have not eaten for a while (fasting). You may have this done every 1-3 years. Bone density scan. This is done to screen for osteoporosis. You may have this done starting at age 63. Mammogram. This may be done every 1-2 years. Talk to your health care provider about how often you should have regular mammograms. Talk with your health care provider about your test results, treatment options, and if necessary, the need for more tests. Vaccines  Your health care provider may recommend certain vaccines, such as: Influenza vaccine. This is recommended every year. Tetanus, diphtheria, and acellular pertussis (Tdap, Td) vaccine. You may need a Td booster every 10 years. Zoster vaccine. You may need this after age 68. Pneumococcal 13-valent conjugate (PCV13) vaccine. One dose is recommended after age 77. Pneumococcal polysaccharide (PPSV23) vaccine. One dose is recommended after age 64. Talk to  your health care provider about which screenings and vaccines  you need and how often you need them. This information is not intended to replace advice given to you by your health care provider. Make sure you discuss any questions you have with your health care provider. Document Released: 04/02/2015 Document Revised: 11/24/2015 Document Reviewed: 01/05/2015 Elsevier Interactive Patient Education  2017 [place] Prevention in the Home Falls can cause injuries. They can happen to people of all ages. There are many things you can do to make your home safe and to help prevent falls. What can I do on the outside of my home? Regularly fix the edges of walkways and driveways and fix any cracks. Remove anything that might make you trip as you walk through a door, such as a raised step or threshold. Trim any bushes or trees on the path to your home. Use bright outdoor lighting. Clear any walking paths of anything that might make someone trip, such as rocks or tools. Regularly check to see if handrails are loose or broken. Make sure that both sides of any steps have handrails. Any raised decks and porches should have guardrails on the edges. Have any leaves, snow, or ice cleared regularly. Use sand or salt on walking paths during winter. Clean up any spills in your garage right away. This includes oil or grease spills. What can I do in the bathroom? Use night lights. Install grab bars by the toilet and in the tub and shower. Do not use towel bars as grab bars. Use non-skid mats or decals in the tub or shower. If you need to sit down in the shower, use a plastic, non-slip stool. Keep the floor dry. Clean up any water that spills on the floor as soon as it happens. Remove soap buildup in the tub or shower regularly. Attach bath mats securely with double-sided non-slip rug tape. Do not have throw rugs and other things on the floor that can make you trip. What can I do in the bedroom? Use night lights. Make sure that you have a light by your bed that  is easy to reach. Do not use any sheets or blankets that are too big for your bed. They should not hang down onto the floor. Have a firm chair that has side arms. You can use this for support while you get dressed. Do not have throw rugs and other things on the floor that can make you trip. What can I do in the kitchen? Clean up any spills right away. Avoid walking on wet floors. Keep items that you use a lot in easy-to-reach places. If you need to reach something above you, use a strong step stool that has a grab bar. Keep electrical cords out of the way. Do not use floor polish or wax that makes floors slippery. If you must use wax, use non-skid floor wax. Do not have throw rugs and other things on the floor that can make you trip. What can I do with my stairs? Do not leave any items on the stairs. Make sure that there are handrails on both sides of the stairs and use them. Fix handrails that are broken or loose. Make sure that handrails are as long as the stairways. Check any carpeting to make sure that it is firmly attached to the stairs. Fix any carpet that is loose or worn. Avoid having throw rugs at the top or bottom of the stairs. If you do have throw rugs, attach  them to the floor with carpet tape. Make sure that you have a light switch at the top of the stairs and the bottom of the stairs. If you do not have them, ask someone to add them for you. What else can I do to help prevent falls? Wear shoes that: Do not have high heels. Have rubber bottoms. Are comfortable and fit you well. Are closed at the toe. Do not wear sandals. If you use a stepladder: Make sure that it is fully opened. Do not climb a closed stepladder. Make sure that both sides of the stepladder are locked into place. Ask someone to hold it for you, if possible. Clearly mark and make sure that you can see: Any grab bars or handrails. First and last steps. Where the edge of each step is. Use tools that help you  move around (mobility aids) if they are needed. These include: Canes. Walkers. Scooters. Crutches. Turn on the lights when you go into a dark area. Replace any light bulbs as soon as they burn out. Set up your furniture so you have a clear path. Avoid moving your furniture around. If any of your floors are uneven, fix them. If there are any pets around you, be aware of where they are. Review your medicines with your doctor. Some medicines can make you feel dizzy. This can increase your chance of falling. Ask your doctor what other things that you can do to help prevent falls. This information is not intended to replace advice given to you by your health care provider. Make sure you discuss any questions you have with your health care provider. Document Released: 12/31/2008 Document Revised: 08/12/2015 Document Reviewed: 04/10/2014 Elsevier Interactive Patient Education  2017 [employer].

## 2020-10-19 ENCOUNTER — Encounter: Payer: Self-pay | Admitting: Internal Medicine

## 2020-10-19 ENCOUNTER — Ambulatory Visit (INDEPENDENT_AMBULATORY_CARE_PROVIDER_SITE_OTHER): Payer: Medicare HMO | Admitting: Internal Medicine

## 2020-10-19 ENCOUNTER — Other Ambulatory Visit: Payer: Self-pay

## 2020-10-19 VITALS — BP 104/66 | HR 72 | Temp 97.7°F | Resp 18 | Ht 65.0 in | Wt 194.1 lb

## 2020-10-19 DIAGNOSIS — N183 Chronic kidney disease, stage 3 unspecified: Secondary | ICD-10-CM

## 2020-10-19 DIAGNOSIS — N3281 Overactive bladder: Secondary | ICD-10-CM

## 2020-10-19 DIAGNOSIS — I4892 Unspecified atrial flutter: Secondary | ICD-10-CM

## 2020-10-19 DIAGNOSIS — I10 Essential (primary) hypertension: Secondary | ICD-10-CM | POA: Diagnosis not present

## 2020-10-19 MED ORDER — DESMOPRESSIN ACETATE 0.2 MG PO TABS: ORAL_TABLET | ORAL | 2 refills | Status: DC

## 2020-10-19 NOTE — Assessment & Plan Note (Signed)
Uncontrolled with DDAVP 0.1 mg QD, increased dose to 0.2 mg QD

## 2020-10-19 NOTE — Assessment & Plan Note (Signed)
Likely due to h/o uncontrolled HTN Avoid nephrotoxic agents including NSAIDs Needs updated BMP

## 2020-10-19 NOTE — Progress Notes (Signed)
Established Patient Office Visit  Subjective:  Patient ID: Paula Long, female    DOB: Jan 02, 1951  Age: 70 y.o. 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  HTN: BP is well-controlled. Takes medications regularly. Patient denies headache, dizziness, chest pain, dyspnea or palpitations.  A Flutter: Denies dyspnea or palpitations. Takes Eliquis regularly, although mentions having cold sensation while she takes it.  Overactive bladder: She states that DDAVP does help, but she has suboptimal response later in the night. Denies any dysuria or hematuria.  Past Medical History:  Diagnosis Date   Anxiety    Arthritis 12-07-11   arthritis,DDD,spinal stenosis   Chronic back pain    COPD (chronic obstructive pulmonary disease) ([place]) 12-07-11   pt. uses nebulizer as needed and inhaler daily   Fibromyalgia    skin cancer   GERD (gastroesophageal reflux disease)    Hypertension 12-07-11   presently no meds   Normal cardiac stress test 06/2009   Skin cancer    melanoma.  face 2014   TIA (transient ischemic attack) ?    Past Surgical History:  Procedure Laterality Date   ABDOMINAL HYSTERECTOMY     BACK SURGERY  12-07-11   2'10 L5 x2   COLONOSCOPY WITH PROPOFOL N/A 06/03/2014   Procedure: COLONOSCOPY WITH PROPOFOL; IN CECUM AT 0750; TOTAL WITHDRAWAL TIME 16 MINUTES;  Surgeon: Rogene Houston, MD;  Location: [facility];  Service: Endoscopy;  Laterality: N/A;   left ankle surgery     tendonitis   LUMBAR LAMINECTOMY/DECOMPRESSION MICRODISCECTOMY  12/11/2011   Procedure: LUMBAR  LAMINECTOMY/DECOMPRESSION MICRODISCECTOMY;  Surgeon: Tobi Bastos, MD;  Location: [facility];  Service: Orthopedics;  Laterality: Left;  Decompressive Lumbar Laminectomy L5-S1 on Left   POLYPECTOMY N/A 06/03/2014   Procedure: POLYPECTOMY;  Surgeon: Rogene Houston, MD;  Location: [facility];  Service: Endoscopy;  Laterality: N/A;   right wrist surgery for pinched nerve     trigger finger surgery  12-07-11   rt. middle trigger finger release    Family History  Problem Relation Age of Onset   Cancer Mother        uterine   Cancer Father        lung    Social History   Socioeconomic History   Marital status: Single    Spouse name: Not on file   Number of children: Not on file   Years of education: Not on file   Highest education level: Not on file  Occupational History   Not on file  Tobacco Use   Smoking status: Every Day    Packs/day: 2.00    Years: 48.00    Pack years: 96.00    Types: Cigarettes    Start date: 01/07/1970   Smokeless tobacco: Never   Tobacco comments:    gone down to 1 pack per day   Vaping Use   Vaping Use: Never used  Substance and Sexual Activity   Alcohol use: No    Alcohol/week: 0.0 standard drinks   Drug use: No   Sexual activity: Yes  Birth control/protection: Surgical  Other Topics Concern   Not on file  Social History Narrative   Not on file   Social Determinants of Health   Financial Resource Strain: Low Risk    Difficulty of Paying Living Expenses: Not hard at all  Food Insecurity: No Food Insecurity   Worried About Charity fundraiser in the Last Year: Never true   Arboriculturist in the Last Year: Never true  Transportation Needs: No Transportation Needs   Lack of Transportation (Medical): No   Lack of Transportation (Non-Medical): No  Physical Activity: Inactive   Days of Exercise per Week: 0 days   Minutes of Exercise per Session: 0 min  Stress: No Stress Concern Present   Feeling of Stress : Not at all  Social Connections:  Socially Isolated   Frequency of Communication with Friends and Family: More than three times a week   Frequency of Social Gatherings with Friends and Family: More than three times a week   Attends Religious Services: Never   Marine scientist or Organizations: No   Attends Music therapist: Never   Marital Status: Divorced  Human resources officer Violence: Not At Risk   Fear of Current or Ex-Partner: No   Emotionally Abused: No   Physically Abused: No   Sexually Abused: No    Outpatient Medications Prior to Visit  Medication Sig Dispense Refill   albuterol (PROVENTIL) (2.5 MG/3ML) 0.083% nebulizer solution INHALE THE CONTENTS OF 1 VIAL VIA NEBULIZER EVERY 4 HOURS AS NEEDED FOR WHEEZING OR SHORTNESS OF BREATH 90 mL 1   albuterol (VENTOLIN HFA) 108 (90 Base) MCG/ACT inhaler INHALE 2 PUFFS EVERY 4 HOURS AS NEEDED FOR WHEEZING OR SHORTNESS OF BREATH 18 g 2   amLODipine (NORVASC) 10 MG tablet Take 1 tablet (10 mg total) by mouth daily. 90 tablet 1   atorvastatin (LIPITOR) 10 MG tablet TAKE 1 TABLET BY MOUTH ONCE DAILY AT 6:00 PM. 90 tablet 3   DEXILANT 60 MG capsule TAKE 1 CAPSULE BY MOUTH DAILY. 90 capsule 3   DULoxetine (CYMBALTA) 30 MG capsule Cymbalta 30 mg capsule,delayed release  Take 2 capsules every day by oral route for 30 days.     EASYMAX TEST test strip USE TO TEST ONCE DAILY. 100 each 3   ELIQUIS 5 MG TABS tablet TAKE (1) TABLET BY MOUTH TWICE DAILY. 60 tablet 0   Fluocinolone Acetonide Body (DERMA-SMOOTHE/FS BODY) 0.01 % OIL Apply to scalp daily a three times a week 120 mL 1   furosemide (LASIX) 40 MG tablet TAKE 1 TABLET TWICE DAILY 180 tablet 1   hydrALAZINE (APRESOLINE) 50 MG tablet Take 2 tablets (100 mg total) by mouth 3 (three) times daily. 540 tablet 3   HYDROmorphone (DILAUDID) 2 MG tablet Take 1 mg by mouth 2 (two) times daily.      ibuprofen (IBU) 600 MG tablet Take 1 tablet (600 mg total) by mouth 2 (two) times daily as needed. 180 tablet 1   labetalol  (NORMODYNE) 200 MG tablet Take 1 tablet (200 mg total) by mouth 2 (two) times daily. 180 tablet 1   Magnesium 400 MG CAPS Take 1 capsule by mouth daily. 90 capsule 1   nystatin (MYCOSTATIN/NYSTOP) powder Apply topically 2 (two) times daily as needed. 60 g 2   traZODone (DESYREL) 150 MG tablet TAKE 1 TABLET AT BEDTIME 90 tablet 3   desmopressin (DDAVP) 0.1 MG tablet TAKE 1 TABLET AT BEDTIME FOR BLADDER 90 tablet 3  nitroGLYCERIN (NITROSTAT) 0.4 MG SL tablet Place 1 tablet (0.4 mg total) under the tongue every 5 (five) minutes as needed for chest pain. 25 tablet 3   No facility-administered medications prior to visit.    Allergies  Allergen Reactions   Hydrocodone-Acetaminophen    Oxycodone-Acetaminophen    Sulfa Antibiotics    Nystatin Rash    Oral rash   Percocet [Oxycodone-Acetaminophen] Rash    Oral rash   Vesicare [Solifenacin] Rash   Vicodin [Hydrocodone-Acetaminophen] Rash    Oral rash    ROS Review of Systems  Constitutional:  Negative for chills and fever.  HENT:  Negative for congestion, sinus pressure, sinus pain and sore throat.   Eyes:  Negative for pain and discharge.  Respiratory:  Negative for cough and shortness of breath.   Cardiovascular:  Negative for chest pain and palpitations.  Gastrointestinal:  Negative for abdominal pain, constipation, diarrhea, nausea and vomiting.  Endocrine: Negative for polydipsia and polyuria.  Genitourinary:  Positive for enuresis. Negative for dysuria and hematuria.  Musculoskeletal:  Positive for arthralgias and back pain. Negative for neck pain and neck stiffness.  Skin:  Negative for rash.  Neurological:  Negative for dizziness, syncope, weakness and headaches.  Psychiatric/Behavioral:  Positive for sleep disturbance. Negative for agitation and behavioral problems. The patient is not nervous/anxious.      Objective:    Physical Exam Vitals reviewed.  Constitutional:      General: She is not in acute distress.     Appearance: She is not diaphoretic.  HENT:     Head: Normocephalic and atraumatic.     Nose: Nose normal.     Mouth/Throat:     Mouth: Mucous membranes are moist.  Eyes:     General: No scleral icterus.    Extraocular Movements: Extraocular movements intact.     Pupils: Pupils are equal, round, and reactive to light.  Cardiovascular:     Rate and Rhythm: Normal rate and regular rhythm.     Pulses: Normal pulses.     Heart sounds: Murmur (Systolic, right upper sternal border) heard.  Pulmonary:     Breath sounds: Normal breath sounds. No wheezing or rales.  Abdominal:     Palpations: Abdomen is soft.     Tenderness: There is no abdominal tenderness.  Musculoskeletal:     Cervical back: Neck supple. No tenderness.     Right lower leg: No edema.     Left lower leg: No edema.  Skin:    General: Skin is warm.     Findings: No rash.  Neurological:     General: No focal deficit present.     Mental Status: She is alert and oriented to person, place, and time.  Psychiatric:        Mood and Affect: Mood normal.        Behavior: Behavior normal.    BP 104/66 (BP Location: Left Arm, Patient Position: Sitting, Cuff Size: Normal)   Pulse 72   Temp 97.7 F (36.5 C) (Oral)   Resp 18   Ht '5\' 5"'$  (1.651 m)   Wt 194 lb 1.9 oz (88.1 kg)   SpO2 97%   BMI 32.30 kg/m  Wt Readings from Last 3 Encounters:  10/19/20 194 lb 1.9 oz (88.1 kg)  06/14/20 196 lb 6.4 oz (89.1 kg)  06/07/20 192 lb 12.8 oz (87.5 kg)     Health Maintenance Due  Topic Date Due   Zoster Vaccines- Shingrix (1 of 2) Never done  TETANUS/TDAP  03/20/2018   COVID-19 Vaccine (3 - Moderna risk series) 10/06/2019   OPHTHALMOLOGY EXAM  05/20/2020   FOOT EXAM  08/12/2020   HEMOGLOBIN A1C  08/18/2020    There are no preventive care reminders to display for this patient.  Lab Results  Component Value Date   TSH 1.571 09/30/2018   Lab Results  Component Value Date   WBC 15.5 (H) 03/09/2020   HGB 12.8 03/09/2020    HCT 39.8 03/09/2020   MCV 95.2 03/09/2020   PLT 211 03/09/2020   Lab Results  Component Value Date   NA 133 (L) 03/09/2020   K 4.2 03/09/2020   CO2 21 (L) 03/09/2020   GLUCOSE 115 (H) 03/09/2020   BUN 21 03/09/2020   CREATININE 1.42 (H) 03/09/2020   BILITOT 0.4 03/09/2020   ALKPHOS 59 03/09/2020   AST 19 03/09/2020   ALT 14 03/09/2020   PROT 7.2 03/09/2020   ALBUMIN 4.0 03/09/2020   CALCIUM 8.9 03/09/2020   ANIONGAP 12 03/09/2020   Lab Results  Component Value Date   CHOL 112 08/13/2019   Lab Results  Component Value Date   HDL 56 08/13/2019   Lab Results  Component Value Date   LDLCALC 45 08/13/2019   Lab Results  Component Value Date   TRIG 40 08/13/2019   Lab Results  Component Value Date   CHOLHDL 2.0 08/13/2019   Lab Results  Component Value Date   HGBA1C 5.4 02/18/2020      Assessment & Plan:   Problem List Items Addressed This Visit       Cardiovascular and Mediastinum   Essential hypertension - Primary    BP Readings from Last 1 Encounters:  10/19/20 104/66  Well-controlled with Amlodipine 10 mg QD, Hydralazine 100 mg TID, Labetalol 200 mg BID and Lasix 40 mg PRN for leg swelling Counseled for compliance with the medications Advised DASH diet and moderate exercise/walking as tolerated Follows up with Cardiologist      Paroxysmal atrial flutter (HCC)    Currently in sinus rhythm On labetalol for rate control and HTN On Eliquis         Genitourinary   OAB (overactive bladder)    Uncontrolled with DDAVP 0.1 mg QD, increased dose to 0.2 mg QD       Relevant Medications   desmopressin (DDAVP) 0.2 MG tablet   CKD (chronic kidney disease) stage 3, GFR 30-59 ml/min (HCC)    Likely due to h/o uncontrolled HTN Avoid nephrotoxic agents including NSAIDs Needs updated BMP        Meds ordered this encounter  Medications   desmopressin (DDAVP) 0.2 MG tablet    Sig: TAKE 1 TABLET AT BEDTIME FOR BLADDER    Dispense:  30 tablet     Refill:  2    Dose change    Follow-up: Return in about 4 months (around 02/18/2021) for HTN and CKD.    Lindell Spar, MD

## 2020-10-19 NOTE — Assessment & Plan Note (Signed)
BP Readings from Last 1 Encounters:  10/19/20 104/66   Well-controlled with Amlodipine 10 mg QD, Hydralazine 100 mg TID, Labetalol 200 mg BID and Lasix 40 mg PRN for leg swelling Counseled for compliance with the medications Advised DASH diet and moderate exercise/walking as tolerated Follows up with Cardiologist

## 2020-10-19 NOTE — Patient Instructions (Signed)
Please start taking Desmopressin 0.2 mg instead of 0.1 mg for bladder.  Please contact your Cardiologist office for follow up visit.

## 2020-10-19 NOTE — Assessment & Plan Note (Signed)
Currently in sinus rhythm On labetalol for rate control and HTN On Eliquis 

## 2020-11-23 ENCOUNTER — Other Ambulatory Visit: Payer: Self-pay | Admitting: Internal Medicine

## 2020-12-02 ENCOUNTER — Other Ambulatory Visit: Payer: Self-pay | Admitting: *Deleted

## 2020-12-02 MED ORDER — ALBUTEROL SULFATE HFA 108 (90 BASE) MCG/ACT IN AERS: INHALATION_SPRAY | RESPIRATORY_TRACT | 2 refills | Status: DC

## 2020-12-15 ENCOUNTER — Ambulatory Visit: Payer: Medicare HMO

## 2020-12-15 ENCOUNTER — Other Ambulatory Visit: Payer: Self-pay

## 2020-12-15 DIAGNOSIS — R252 Cramp and spasm: Secondary | ICD-10-CM | POA: Diagnosis not present

## 2020-12-15 DIAGNOSIS — I10 Essential (primary) hypertension: Secondary | ICD-10-CM | POA: Diagnosis not present

## 2020-12-15 DIAGNOSIS — E785 Hyperlipidemia, unspecified: Secondary | ICD-10-CM | POA: Diagnosis not present

## 2020-12-15 DIAGNOSIS — N183 Chronic kidney disease, stage 3 unspecified: Secondary | ICD-10-CM | POA: Diagnosis not present

## 2020-12-15 DIAGNOSIS — I4892 Unspecified atrial flutter: Secondary | ICD-10-CM | POA: Diagnosis not present

## 2020-12-15 LAB — HM DIABETES EYE EXAM

## 2020-12-16 ENCOUNTER — Other Ambulatory Visit: Payer: Self-pay | Admitting: *Deleted

## 2020-12-16 ENCOUNTER — Other Ambulatory Visit: Payer: Self-pay | Admitting: Internal Medicine

## 2020-12-16 DIAGNOSIS — N183 Chronic kidney disease, stage 3 unspecified: Secondary | ICD-10-CM

## 2020-12-16 LAB — MAGNESIUM: Magnesium: 2.1 mg/dL (ref 1.6–2.3)

## 2020-12-16 LAB — BASIC METABOLIC PANEL
BUN/Creatinine Ratio: 13 (ref 12–28)
BUN: 21 mg/dL (ref 8–27)
CO2: 23 mmol/L (ref 20–29)
Calcium: 9.3 mg/dL (ref 8.7–10.3)
Chloride: 99 mmol/L (ref 96–106)
Creatinine, Ser: 1.59 mg/dL — ABNORMAL HIGH (ref 0.57–1.00)
Glucose: 97 mg/dL (ref 70–99)
Potassium: 5 mmol/L (ref 3.5–5.2)
Sodium: 136 mmol/L (ref 134–144)
eGFR: 35 mL/min/{1.73_m2} — ABNORMAL LOW (ref >59)

## 2020-12-16 LAB — CBC
Hematocrit: 38.3 % (ref 34.0–46.6)
Hemoglobin: 12.7 g/dL (ref 11.1–15.9)
MCH: 30.6 pg (ref 26.6–33.0)
MCHC: 33.2 g/dL (ref 31.5–35.7)
MCV: 92 fL (ref 79–97)
Platelets: 260 10*3/uL (ref 150–450)
RBC: 4.15 x10E6/uL (ref 3.77–5.28)
RDW: 13.2 % (ref 11.7–15.4)
WBC: 7.2 10*3/uL (ref 3.4–10.8)

## 2020-12-16 LAB — LIPID PANEL
Chol/HDL Ratio: 1.8 ratio (ref 0.0–4.4)
Cholesterol, Total: 123 mg/dL (ref 100–199)
HDL: 70 mg/dL (ref >39)
LDL Chol Calc (NIH): 44 mg/dL (ref 0–99)
Triglycerides: 33 mg/dL (ref 0–149)
VLDL Cholesterol Cal: 9 mg/dL (ref 5–40)

## 2020-12-27 ENCOUNTER — Other Ambulatory Visit: Payer: Self-pay | Admitting: Internal Medicine

## 2020-12-27 DIAGNOSIS — N3281 Overactive bladder: Secondary | ICD-10-CM

## 2021-01-26 ENCOUNTER — Other Ambulatory Visit (HOSPITAL_BASED_OUTPATIENT_CLINIC_OR_DEPARTMENT_OTHER): Payer: Self-pay | Admitting: Nephrology

## 2021-01-26 ENCOUNTER — Other Ambulatory Visit (HOSPITAL_COMMUNITY): Payer: Self-pay | Admitting: Nephrology

## 2021-01-26 DIAGNOSIS — N1832 Chronic kidney disease, stage 3b: Secondary | ICD-10-CM | POA: Diagnosis not present

## 2021-01-26 DIAGNOSIS — I129 Hypertensive chronic kidney disease with stage 1 through stage 4 chronic kidney disease, or unspecified chronic kidney disease: Secondary | ICD-10-CM | POA: Diagnosis not present

## 2021-01-26 DIAGNOSIS — I5032 Chronic diastolic (congestive) heart failure: Secondary | ICD-10-CM

## 2021-01-26 DIAGNOSIS — E559 Vitamin D deficiency, unspecified: Secondary | ICD-10-CM | POA: Diagnosis not present

## 2021-01-26 DIAGNOSIS — I10 Essential (primary) hypertension: Secondary | ICD-10-CM

## 2021-01-26 DIAGNOSIS — N280 Ischemia and infarction of kidney: Secondary | ICD-10-CM

## 2021-01-26 DIAGNOSIS — Z716 Tobacco abuse counseling: Secondary | ICD-10-CM | POA: Diagnosis not present

## 2021-01-26 DIAGNOSIS — I774 Celiac artery compression syndrome: Secondary | ICD-10-CM | POA: Diagnosis not present

## 2021-02-01 ENCOUNTER — Other Ambulatory Visit: Payer: Self-pay

## 2021-02-01 ENCOUNTER — Telehealth: Payer: Self-pay | Admitting: Internal Medicine

## 2021-02-01 ENCOUNTER — Other Ambulatory Visit: Payer: Self-pay | Admitting: Internal Medicine

## 2021-02-01 DIAGNOSIS — I6523 Occlusion and stenosis of bilateral carotid arteries: Secondary | ICD-10-CM

## 2021-02-01 DIAGNOSIS — F5104 Psychophysiologic insomnia: Secondary | ICD-10-CM

## 2021-02-01 DIAGNOSIS — K551 Chronic vascular disorders of intestine: Secondary | ICD-10-CM

## 2021-02-01 MED ORDER — TRAZODONE HCL 150 MG PO TABS: 150.0000 mg | ORAL_TABLET | Freq: Every day | ORAL | 1 refills | Status: DC

## 2021-02-01 NOTE — Telephone Encounter (Signed)
Pt is requesting trazadone to be sent to [facility]

## 2021-02-03 ENCOUNTER — Ambulatory Visit (HOSPITAL_COMMUNITY): Payer: Medicare HMO

## 2021-02-15 NOTE — Progress Notes (Signed)
Error - Erroneous Encounter

## 2021-02-18 DIAGNOSIS — I5032 Chronic diastolic (congestive) heart failure: Secondary | ICD-10-CM | POA: Diagnosis not present

## 2021-02-18 DIAGNOSIS — I129 Hypertensive chronic kidney disease with stage 1 through stage 4 chronic kidney disease, or unspecified chronic kidney disease: Secondary | ICD-10-CM | POA: Diagnosis not present

## 2021-02-18 DIAGNOSIS — N280 Ischemia and infarction of kidney: Secondary | ICD-10-CM | POA: Diagnosis not present

## 2021-02-18 DIAGNOSIS — N1832 Chronic kidney disease, stage 3b: Secondary | ICD-10-CM | POA: Diagnosis not present

## 2021-02-18 DIAGNOSIS — E559 Vitamin D deficiency, unspecified: Secondary | ICD-10-CM | POA: Diagnosis not present

## 2021-02-18 DIAGNOSIS — Z79899 Other long term (current) drug therapy: Secondary | ICD-10-CM | POA: Diagnosis not present

## 2021-02-21 ENCOUNTER — Ambulatory Visit (INDEPENDENT_AMBULATORY_CARE_PROVIDER_SITE_OTHER): Payer: Medicare HMO | Admitting: Internal Medicine

## 2021-02-21 ENCOUNTER — Encounter: Payer: Self-pay | Admitting: Internal Medicine

## 2021-02-21 ENCOUNTER — Other Ambulatory Visit: Payer: Self-pay

## 2021-02-21 DIAGNOSIS — Z1231 Encounter for screening mammogram for malignant neoplasm of breast: Secondary | ICD-10-CM | POA: Diagnosis not present

## 2021-02-21 DIAGNOSIS — N3281 Overactive bladder: Secondary | ICD-10-CM

## 2021-02-21 DIAGNOSIS — G894 Chronic pain syndrome: Secondary | ICD-10-CM | POA: Diagnosis not present

## 2021-02-21 DIAGNOSIS — I4892 Unspecified atrial flutter: Secondary | ICD-10-CM | POA: Diagnosis not present

## 2021-02-21 DIAGNOSIS — R42 Dizziness and giddiness: Secondary | ICD-10-CM

## 2021-02-21 DIAGNOSIS — H938X3 Other specified disorders of ear, bilateral: Secondary | ICD-10-CM

## 2021-02-21 DIAGNOSIS — I10 Essential (primary) hypertension: Secondary | ICD-10-CM | POA: Diagnosis not present

## 2021-02-21 MED ORDER — DEBROX 6.5 % OT SOLN: 5.0000 [drp] | Freq: Two times a day (BID) | OTIC | 0 refills | Status: DC

## 2021-02-21 MED ORDER — MECLIZINE HCL 25 MG PO TABS: 25.0000 mg | ORAL_TABLET | Freq: Three times a day (TID) | ORAL | 0 refills | Status: DC | PRN

## 2021-02-21 NOTE — Assessment & Plan Note (Signed)
Currently in sinus rhythm On labetalol for rate control and HTN On Eliquis 

## 2021-02-21 NOTE — Assessment & Plan Note (Signed)
BP Readings from Last 1 Encounters:  10/19/20 104/66   Well-controlled with Amlodipine 10 mg QD, Hydralazine 100 mg TID, Labetalol 200 mg BID and Lasix 40 mg PRN for leg swelling Counseled for compliance with the medications Advised DASH diet and moderate exercise/walking as tolerated Follows up with Cardiologist

## 2021-02-21 NOTE — Assessment & Plan Note (Signed)
Now better with desmopressin 0.2 mg daily

## 2021-02-21 NOTE — Progress Notes (Signed)
Virtual Visit via Telephone Note   This visit type was conducted due to national recommendations for restrictions regarding the COVID-19 Pandemic (e.g. social distancing) in an effort to limit this patient's exposure and mitigate transmission in our community.  Due to her co-morbid illnesses, this patient is at least at moderate risk for complications without adequate follow up.  This format is felt to be most appropriate for this patient at this time.  The patient did not have access to video technology/had technical difficulties with video requiring transitioning to audio format only (telephone).  All issues noted in this document were discussed and addressed.  No physical exam could be performed with this format.  Evaluation Performed:  Follow-up visit  Date:  02/21/2021   ID:  Paula Long, Paula Long 03-23-1950, MRN [id number]  Patient Location: Home Provider Location: Office/Clinic  Participants: Patient Location of Patient: Home Location of Provider: Telehealth Consent was obtain for visit to be over via telehealth. I verified that I am speaking with the correct person using two identifiers.  PCP:  Lindell Spar, MD   Chief Complaint:  Follow up of her chronic medical conditions  History of Present Illness:    Paula Long is a 70 y.o. 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  She c/o dizziness that started few hours ago. She also c/o  b/l ear fullness, but denies any ear pain or discharge currently.  She also reports chronic nasal congestion and crusting/bleeding at times.  She denies any fever, chills, nausea or vomiting currently.  She complains of chronic low back pain and muscle spasms.  She used to see Dr. [name] for  chronic low back pain and was on Dilaudid in the past.  She is not seeing Dr. [name] now and requests a new referral for pain clinic.  HTN: BP is well-controlled. Takes medications regularly. Patient denies headache, dizziness, chest pain, dyspnea or palpitations.   A Flutter: Denies dyspnea or palpitations. Takes Eliquis regularly, although mentions having cold sensation while she takes it.  Overactive bladder: She states that DDAVP 0.2 mg helps. Denies any dysuria or hematuria.    The patient does not have symptoms concerning for COVID-19 infection (fever, chills, cough, or new shortness of breath).   Past Medical, Surgical, Social History, Allergies, and Medications have been Reviewed.  Past Medical History:  Diagnosis Date   Anxiety    Arthritis 12-07-11   arthritis,DDD,spinal stenosis   Chronic back pain    COPD (chronic obstructive pulmonary disease) ([place]) 12-07-11   pt. uses nebulizer as needed and inhaler daily   Fibromyalgia    skin cancer   GERD (gastroesophageal reflux disease)    Hypertension 12-07-11   presently no meds   Normal cardiac stress test 06/2009   Skin cancer    melanoma.  face 2014   TIA (transient ischemic attack) ?   Past Surgical History:  Procedure Laterality Date   ABDOMINAL HYSTERECTOMY     BACK SURGERY  12-07-11   2'10 L5 x2   COLONOSCOPY WITH PROPOFOL N/A 06/03/2014   Procedure: COLONOSCOPY WITH PROPOFOL; IN CECUM AT 0750; TOTAL WITHDRAWAL TIME 16 MINUTES;  Surgeon: Rogene Houston, MD;  Location: [facility];  Service: Endoscopy;  Laterality: N/A;   left ankle surgery  tendonitis   LUMBAR LAMINECTOMY/DECOMPRESSION MICRODISCECTOMY  12/11/2011   Procedure: LUMBAR LAMINECTOMY/DECOMPRESSION MICRODISCECTOMY;  Surgeon: Tobi Bastos, MD;  Location: [facility];  Service: Orthopedics;  Laterality: Left;  Decompressive Lumbar Laminectomy L5-S1 on Left   POLYPECTOMY N/A 06/03/2014   Procedure: POLYPECTOMY;  Surgeon: Rogene Houston, MD;  Location: [facility];   Service: Endoscopy;  Laterality: N/A;   right wrist surgery for pinched nerve     trigger finger surgery  12-07-11   rt. middle trigger finger release     Current Meds  Medication Sig   albuterol (PROVENTIL) (2.5 MG/3ML) 0.083% nebulizer solution INHALE THE CONTENTS OF 1 VIAL VIA NEBULIZER EVERY 4 HOURS AS NEEDED FOR WHEEZING OR SHORTNESS OF BREATH   albuterol (VENTOLIN HFA) 108 (90 Base) MCG/ACT inhaler INHALE 2 PUFFS EVERY 4 HOURS AS NEEDED FOR WHEEZING OR SHORTNESS OF BREATH   amLODipine (NORVASC) 10 MG tablet Take 1 tablet (10 mg total) by mouth daily.   atorvastatin (LIPITOR) 10 MG tablet TAKE 1 TABLET BY MOUTH ONCE DAILY AT 6:00 PM.   carbamide peroxide (DEBROX) 6.5 % OTIC solution Place 5 drops into both ears 2 (two) times daily.   desmopressin (DDAVP) 0.2 MG tablet TAKE 1 TABLET BY MOUTH ONCE AT BEDTIME FOR BLADDER.   DEXILANT 60 MG capsule TAKE 1 CAPSULE BY MOUTH DAILY.   DULoxetine (CYMBALTA) 30 MG capsule Cymbalta 30 mg capsule,delayed release  Take 2 capsules every day by oral route for 30 days.   EASYMAX TEST test strip USE TO TEST ONCE DAILY.   ELIQUIS 5 MG TABS tablet TAKE (1) TABLET BY MOUTH TWICE DAILY.   Fluocinolone Acetonide Body (DERMA-SMOOTHE/FS BODY) 0.01 % OIL Apply to scalp daily a three times a week   furosemide (LASIX) 40 MG tablet TAKE 1 TABLET TWICE DAILY   hydrALAZINE (APRESOLINE) 50 MG tablet Take 2 tablets (100 mg total) by mouth 3 (three) times daily.   HYDROmorphone (DILAUDID) 2 MG tablet Take 1 mg by mouth 2 (two) times daily.    labetalol (NORMODYNE) 200 MG tablet Take 1 tablet (200 mg total) by mouth 2 (two) times daily.   Magnesium 400 MG CAPS Take 1 capsule by mouth daily.   meclizine (ANTIVERT) 25 MG tablet Take 1 tablet (25 mg total) by mouth 3 (three) times daily as needed for dizziness.   nystatin (MYCOSTATIN/NYSTOP) powder Apply topically 2 (two) times daily as needed.   traZODone (DESYREL) 150 MG tablet Take 1 tablet (150 mg total) by mouth at  bedtime.     Allergies:   Hydrocodone-acetaminophen, Oxycodone-acetaminophen, Sulfa antibiotics, Nystatin, Percocet [oxycodone-acetaminophen], Vesicare [solifenacin], and Vicodin [hydrocodone-acetaminophen]   ROS:   Please see the history of present illness.     All other systems reviewed and are negative.   Labs/Other Tests and Data Reviewed:    Recent Labs: 03/09/2020: ALT 14 12/15/2020: BUN 21; Creatinine, Ser 1.59; Hemoglobin 12.7; Magnesium 2.1; Platelets 260; Potassium 5.0; Sodium 136   Recent Lipid Panel Lab Results  Component Value Date/Time   CHOL 123 12/15/2020 04:33 PM   TRIG 33 12/15/2020 04:33 PM   HDL 70 12/15/2020 04:33 PM   CHOLHDL 1.8 12/15/2020 04:33 PM   CHOLHDL 2.0 08/13/2019 03:30 PM   LDLCALC 44 12/15/2020 04:33 PM   LDLCALC 45 08/13/2019 03:30 PM    Wt Readings from Last 3 Encounters:  10/19/20 194 lb 1.9 oz (88.1 kg)  06/14/20 196 lb 6.4 oz (89.1 kg)  06/07/20 192 lb 12.8 oz (87.5 kg)  ASSESSMENT & PLAN:    Essential hypertension BP Readings from Last 1 Encounters:  10/19/20 104/66   Well-controlled with Amlodipine 10 mg QD, Hydralazine 100 mg TID, Labetalol 200 mg BID and Lasix 40 mg PRN for leg swelling Counseled for compliance with the medications Advised DASH diet and moderate exercise/walking as tolerated Follows up with Cardiologist  Paroxysmal atrial flutter (HCC) Currently in sinus rhythm On labetalol for rate control and HTN On Eliquis  Chronic pain syndrome S/p lumbar spine surgeries Follows up with Dr [name] for pain management -was on Dilaudid, does not see Dr. [name] now Referred to a different pain clinic for second opinion  OAB (overactive bladder) Now better with desmopressin 0.2 mg daily  Dizziness/ear fullness Could be due to chronic sinusitis or excess earwax Debrox eardrops for excess earwax Advised to use nasal saline spray for dry nostrils and nasal congestion Advised to maintain adequate  hydration    Time:   Today, I have spent 25 minutes reviewing the chart, including problem list, medications, and with the patient with telehealth technology discussing the above problems.   Medication Adjustments/Labs and Tests Ordered: Current medicines are reviewed at length with the patient today.  Concerns regarding medicines are outlined above.   Tests Ordered: Orders Placed This Encounter  Procedures   MM 3D SCREEN BREAST BILATERAL   Ambulatory referral to Pain Clinic    Medication Changes: Meds ordered this encounter  Medications   meclizine (ANTIVERT) 25 MG tablet    Sig: Take 1 tablet (25 mg total) by mouth 3 (three) times daily as needed for dizziness.    Dispense:  30 tablet    Refill:  0   carbamide peroxide (DEBROX) 6.5 % OTIC solution    Sig: Place 5 drops into both ears 2 (two) times daily.    Dispense:  15 mL    Refill:  0     Note: This dictation was prepared with Dragon dictation along with smaller phrase technology. Similar sounding words can be transcribed inadequately or may not be corrected upon review. Any transcriptional errors that result from this process are unintentional.      Disposition:  Follow up  Signed, Lindell Spar, MD  02/21/2021 1:39 PM     [place]

## 2021-02-21 NOTE — Assessment & Plan Note (Signed)
S/p lumbar spine surgeries Follows up with Dr [name] for pain management -was on Dilaudid, does not see Dr. [name] now Referred to a different pain clinic for second opinion

## 2021-02-21 NOTE — Patient Instructions (Signed)
Please take meclizine for dizziness. Please try to avoid sudden positional changes. Maintain adequate hydration by taking at least 64 ounces of fluid in a day.  Please use the proximal eardrops for excess earwax.  Please use nasal saline spray for nasal congestion and dryness of the nostrils.  Please use vaporizer or humidifier to help with dry air.

## 2021-02-25 ENCOUNTER — Other Ambulatory Visit: Payer: Self-pay | Admitting: Internal Medicine

## 2021-02-25 DIAGNOSIS — N3281 Overactive bladder: Secondary | ICD-10-CM

## 2021-02-28 ENCOUNTER — Ambulatory Visit (INDEPENDENT_AMBULATORY_CARE_PROVIDER_SITE_OTHER)
Admission: RE | Admit: 2021-02-28 | Discharge: 2021-02-28 | Disposition: A | Discharge status: Home / Self Care | Payer: Medicare HMO | Source: Ambulatory Visit | Attending: Surgery | Admitting: Surgery

## 2021-02-28 ENCOUNTER — Encounter: Payer: Self-pay | Admitting: Surgery

## 2021-02-28 ENCOUNTER — Ambulatory Visit: Payer: Medicare HMO | Admitting: Surgery

## 2021-02-28 ENCOUNTER — Ambulatory Visit (HOSPITAL_COMMUNITY)
Admission: RE | Admit: 2021-02-28 | Discharge: 2021-02-28 | Disposition: A | Discharge status: Home / Self Care | Payer: Medicare HMO | Source: Ambulatory Visit | Attending: Surgery | Admitting: Surgery

## 2021-02-28 ENCOUNTER — Other Ambulatory Visit: Payer: Self-pay

## 2021-02-28 VITALS — BP 145/61 | HR 56 | Temp 98.0°F | Resp 20 | Ht 65.0 in | Wt 206.0 lb

## 2021-02-28 DIAGNOSIS — I6523 Occlusion and stenosis of bilateral carotid arteries: Secondary | ICD-10-CM

## 2021-02-28 DIAGNOSIS — K551 Chronic vascular disorders of intestine: Secondary | ICD-10-CM

## 2021-02-28 NOTE — Progress Notes (Signed)
[facility] [place]  Patient name: Paula Long MRN: [id number] DOB: [birth year] Sex: female   REASON FOR VISIT:    Follow up  [place]:    Paula Long is a 70 y.o. female Who is back for follow-up today.  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  She has a history of malignant hypertension.  Her renal ultrasound last year did not show any significant renal artery stenosis.  She did have mild carotid disease and mesenteric stenosis.  Today she denies any strokelike symptoms.  She denies postprandial abdominal pain.  She denies weight loss.  The patient has a history of palpitations, for which she sees cardiology.  She had a uneventful cardiac event monitor performed.  She has a history of hypertension.  Her blood pressure is not well controlled.  She has been on an ACE inhibitor but this was discontinued because of renal issues.  She is now on ARB.  She has also had a increase in her creatinine.   PAST MEDICAL HISTORY:   Past Medical History:  Diagnosis Date   Anxiety    Arthritis 12-07-11   arthritis,DDD,spinal stenosis   Chronic back pain    COPD (chronic obstructive pulmonary disease) ([place]) 12-07-11   pt. uses nebulizer as needed and inhaler daily   Fibromyalgia    skin cancer   GERD (gastroesophageal reflux disease)    Hypertension 12-07-11   presently no meds   Normal cardiac stress test 06/2009   Skin cancer    melanoma.  face 2014   TIA (transient ischemic attack) ?     FAMILY HISTORY:   Family History  Problem Relation Age of Onset   Cancer Mother        uterine   Cancer Father        lung    SOCIAL HISTORY:   Social History   Tobacco Use   Smoking status: Every Day    Packs/day: 1.50    Years: 48.00    Pack years: 72.00    Types: Cigarettes    Start date: 01/07/1970   Smokeless tobacco: Never   Tobacco comments:    gone  down to 1 pack per day   Substance Use Topics   Alcohol use: No    Alcohol/week: 0.0 standard drinks     ALLERGIES:   Allergies  Allergen Reactions   Hydrocodone-Acetaminophen    Oxycodone-Acetaminophen    Sulfa Antibiotics    Nystatin Rash    Oral rash   Percocet [Oxycodone-Acetaminophen] Rash    Oral rash   Vesicare [Solifenacin] Rash   Vicodin [Hydrocodone-Acetaminophen] Rash    Oral rash     CURRENT MEDICATIONS:   Current Outpatient Medications  Medication Sig Dispense Refill   albuterol (PROVENTIL) (2.5 MG/3ML) 0.083% nebulizer solution INHALE THE CONTENTS OF 1 VIAL VIA NEBULIZER EVERY 4 HOURS AS NEEDED FOR WHEEZING OR SHORTNESS OF BREATH 90 mL 1   albuterol (VENTOLIN HFA) 108 (90 Base) MCG/ACT inhaler INHALE 2 PUFFS EVERY 4 HOURS AS NEEDED FOR WHEEZING OR SHORTNESS OF BREATH 18 g 2   amLODipine (NORVASC) 10 MG tablet Take 1 tablet (10 mg total) by mouth daily. 90 tablet 1   atorvastatin (LIPITOR) 10 MG tablet TAKE 1 TABLET BY MOUTH ONCE DAILY AT 6:00 PM. 90 tablet 3   carbamide peroxide (DEBROX) 6.5 % OTIC solution Place 5 drops into both ears 2 (two) times daily. 15 mL  0   desmopressin (DDAVP) 0.2 MG tablet TAKE 1 TABLET BY MOUTH ONCE AT BEDTIME FOR BLADDER. 90 tablet 3   DEXILANT 60 MG capsule TAKE 1 CAPSULE BY MOUTH DAILY. 90 capsule 3   DULoxetine (CYMBALTA) 30 MG capsule Cymbalta 30 mg capsule,delayed release  Take 2 capsules every day by oral route for 30 days.     EASYMAX TEST test strip USE TO TEST ONCE DAILY. 100 each 3   ELIQUIS 5 MG TABS tablet TAKE (1) TABLET BY MOUTH TWICE DAILY. 60 tablet 0   Fluocinolone Acetonide Body (DERMA-SMOOTHE/FS BODY) 0.01 % OIL Apply to scalp daily a three times a week 120 mL 1   furosemide (LASIX) 40 MG tablet TAKE 1 TABLET TWICE DAILY 180 tablet 1   hydrALAZINE (APRESOLINE) 50 MG tablet Take 2 tablets (100 mg total) by mouth 3 (three) times daily. 540 tablet 3   HYDROmorphone (DILAUDID) 2 MG tablet Take 1 mg by mouth 2 (two)  times daily.      labetalol (NORMODYNE) 200 MG tablet Take 1 tablet (200 mg total) by mouth 2 (two) times daily. 180 tablet 1   Magnesium 400 MG CAPS Take 1 capsule by mouth daily. 90 capsule 1   meclizine (ANTIVERT) 25 MG tablet Take 1 tablet (25 mg total) by mouth 3 (three) times daily as needed for dizziness. 30 tablet 0   nystatin (MYCOSTATIN/NYSTOP) powder Apply topically 2 (two) times daily as needed. 60 g 2   traZODone (DESYREL) 150 MG tablet Take 1 tablet (150 mg total) by mouth at bedtime. 90 tablet 1   nitroGLYCERIN (NITROSTAT) 0.4 MG SL tablet Place 1 tablet (0.4 mg total) under the tongue every 5 (five) minutes as needed for chest pain. 25 tablet 3   No current facility-administered medications for this visit.    REVIEW OF SYSTEMS:   [X]  denotes positive finding, [ ]  denotes negative finding Cardiac  Comments:  Chest pain or chest pressure:    Shortness of breath upon exertion:    Short of breath when lying flat:    Irregular heart rhythm:        Vascular    Pain in calf, thigh, or hip brought on by ambulation:    Pain in feet at night that wakes you up from your sleep:     Blood clot in your veins:    Leg swelling:         Pulmonary    Oxygen at home:    Productive cough:     Wheezing:         Neurologic    Sudden weakness in arms or legs:     Sudden numbness in arms or legs:     Sudden onset of difficulty speaking or slurred speech:    Temporary loss of vision in one eye:     Problems with dizziness:         Gastrointestinal    Blood in stool:     Vomited blood:         Genitourinary    Burning when urinating:     Blood in urine:        Psychiatric    Major depression:         Hematologic    Bleeding problems:    Problems with blood clotting too easily:        Skin    Rashes or ulcers:        Constitutional    Fever or chills:  PHYSICAL EXAM:   Vitals:   02/28/21 0912  BP: (!) 145/61  Pulse: (!) 56  Resp: 20  Temp: 98 F (36.7 C)   SpO2: 95%  Weight: 206 lb (93.4 kg)  Height: 5\' 5"  (1.651 m)    GENERAL: The patient is a well-nourished female, in no acute distress. The vital signs are documented above. CARDIAC: There is a regular rate and rhythm.  VASCULAR: I could not palpate pedal pulses PULMONARY: Non-labored respirations MUSCULOSKELETAL: There are no major deformities or cyanosis. NEUROLOGIC: No focal weakness or paresthesias are detected. SKIN: There are no ulcers or rashes noted. PSYCHIATRIC: The patient has a normal affect.  STUDIES:   I have reviewed the following studies: Right Carotid: Velocities in the right ICA are consistent with a 1-39%  stenosis.   Left Carotid: Velocities in the left ICA are consistent with a 1-39%  stenosis.   Vertebrals:  Bilateral vertebral arteries demonstrate antegrade flow.  Subclavians: Normal flow hemodynamics were seen in bilateral subclavian               arteries.   Mesenteric:  Normal Hepatic artery, Celiac artery , Superior Mesenteric artery and  Inferior  Mesenteric artery findings.  Technically difficult secondary to obesity/ bowel gas. Could not replicate  elevated velocities from prior studies. Findings suggest splenic artery  stenosis.  MEDICAL ISSUES:   Carotid: Elevated velocities were not visualized on today's study.  Previously she had 50 to 69% bilateral stenosis.  She remains asymptomatic.  I will repeat her study in 1 year.  Chronic mesenteric stenosis: Elevated velocities could not be replicated in today's study however visualization was somewhat limited.  Regardless, the patient is without symptoms of postprandial abdominal pain or weight loss, therefore no intervention would be recommended.  She knows to contact me should her symptoms change.  The patient will follow-up in 1 year with repeat carotid duplex and mesenteric duplex.    Leia Alf, MD, FACS [facility] [phone] Pager [phone]

## 2021-03-07 DIAGNOSIS — I129 Hypertensive chronic kidney disease with stage 1 through stage 4 chronic kidney disease, or unspecified chronic kidney disease: Secondary | ICD-10-CM | POA: Diagnosis not present

## 2021-03-07 DIAGNOSIS — E211 Secondary hyperparathyroidism, not elsewhere classified: Secondary | ICD-10-CM | POA: Diagnosis not present

## 2021-03-07 DIAGNOSIS — N1832 Chronic kidney disease, stage 3b: Secondary | ICD-10-CM | POA: Diagnosis not present

## 2021-03-07 DIAGNOSIS — D841 Defects in the complement system: Secondary | ICD-10-CM | POA: Diagnosis not present

## 2021-03-07 DIAGNOSIS — E559 Vitamin D deficiency, unspecified: Secondary | ICD-10-CM | POA: Diagnosis not present

## 2021-03-07 DIAGNOSIS — R768 Other specified abnormal immunological findings in serum: Secondary | ICD-10-CM | POA: Diagnosis not present

## 2021-03-07 DIAGNOSIS — N280 Ischemia and infarction of kidney: Secondary | ICD-10-CM | POA: Diagnosis not present

## 2021-03-29 ENCOUNTER — Encounter: Payer: Self-pay | Admitting: *Deleted

## 2021-03-29 ENCOUNTER — Telehealth: Payer: Self-pay | Admitting: *Deleted

## 2021-03-29 ENCOUNTER — Telehealth: Payer: Self-pay

## 2021-03-29 DIAGNOSIS — Z2821 Immunization not carried out because of patient refusal: Secondary | ICD-10-CM | POA: Insufficient documentation

## 2021-03-29 NOTE — Telephone Encounter (Signed)
Patient called about her bladder pill saying prescription can not be filled til march, only has 1 pill left and she takes this med at 6:00 every evening.    Needs refill:  Desmopression 0.2 mg  Pharmacy: [facility]

## 2021-03-29 NOTE — Telephone Encounter (Signed)
Pt stated she only got 30 days supply of desmopressin 0.2 mg instead of 90 and now she is going to be out of the medication and the pharmacy will not refill let her know she should have had 90 days supply pt stated she did not she only takes one per day

## 2021-03-29 NOTE — Telephone Encounter (Signed)
Pt advised with verbal understanding  °

## 2021-03-29 NOTE — Telephone Encounter (Signed)
Patient informed.  And Patient called the pharmacy and said could not be refilled til February.

## 2021-03-29 NOTE — Telephone Encounter (Signed)
Please let pt know she just needs to call pharmacy to refill this was sent 02-25-22 with 3 refills and 90 day supply they should be able to refill

## 2021-03-31 ENCOUNTER — Telehealth: Payer: Self-pay | Admitting: Internal Medicine

## 2021-03-31 NOTE — Telephone Encounter (Signed)
Mammo scheduled , LVM to advise pt

## 2021-03-31 NOTE — Telephone Encounter (Signed)
New orders were placed in December please call and schedule mammo for pt and give her the information

## 2021-03-31 NOTE — Telephone Encounter (Signed)
Pt called in regard to mammo   Pt needs new order sent in for mammo

## 2021-04-04 ENCOUNTER — Ambulatory Visit (HOSPITAL_COMMUNITY)
Admission: RE | Admit: 2021-04-04 | Discharge: 2021-04-04 | Disposition: A | Discharge status: Home / Self Care | Payer: Medicare HMO | Source: Ambulatory Visit | Attending: Internal Medicine | Admitting: Internal Medicine

## 2021-04-04 ENCOUNTER — Ambulatory Visit (HOSPITAL_COMMUNITY)
Admission: RE | Admit: 2021-04-04 | Discharge: 2021-04-04 | Disposition: A | Discharge status: Home / Self Care | Payer: Medicare HMO | Source: Ambulatory Visit | Attending: Nephrology | Admitting: Nephrology

## 2021-04-04 ENCOUNTER — Other Ambulatory Visit (HOSPITAL_COMMUNITY): Payer: Self-pay | Admitting: Internal Medicine

## 2021-04-04 ENCOUNTER — Other Ambulatory Visit: Payer: Self-pay

## 2021-04-04 DIAGNOSIS — Z1231 Encounter for screening mammogram for malignant neoplasm of breast: Secondary | ICD-10-CM | POA: Insufficient documentation

## 2021-04-04 DIAGNOSIS — N2889 Other specified disorders of kidney and ureter: Secondary | ICD-10-CM | POA: Diagnosis not present

## 2021-04-04 DIAGNOSIS — R928 Other abnormal and inconclusive findings on diagnostic imaging of breast: Secondary | ICD-10-CM

## 2021-04-04 DIAGNOSIS — E559 Vitamin D deficiency, unspecified: Secondary | ICD-10-CM | POA: Insufficient documentation

## 2021-04-04 DIAGNOSIS — N1832 Chronic kidney disease, stage 3b: Secondary | ICD-10-CM | POA: Insufficient documentation

## 2021-04-04 DIAGNOSIS — I10 Essential (primary) hypertension: Secondary | ICD-10-CM | POA: Insufficient documentation

## 2021-04-04 DIAGNOSIS — I5032 Chronic diastolic (congestive) heart failure: Secondary | ICD-10-CM | POA: Insufficient documentation

## 2021-04-04 DIAGNOSIS — N280 Ischemia and infarction of kidney: Secondary | ICD-10-CM | POA: Insufficient documentation

## 2021-04-05 ENCOUNTER — Other Ambulatory Visit (HOSPITAL_COMMUNITY): Payer: Self-pay | Admitting: Internal Medicine

## 2021-04-05 DIAGNOSIS — R928 Other abnormal and inconclusive findings on diagnostic imaging of breast: Secondary | ICD-10-CM

## 2021-04-06 ENCOUNTER — Telehealth: Payer: Self-pay | Admitting: Internal Medicine

## 2021-04-06 NOTE — Telephone Encounter (Signed)
Pt is calling she has a catch in her back, as she was bending over the clothes basket and she felt it .Marland Kitchen  No appts today, and she has appts tomorrow  She wanted me to send to the nurse to see what to do

## 2021-04-06 NOTE — Telephone Encounter (Signed)
Made appt for 04-07-21

## 2021-04-06 NOTE — Telephone Encounter (Signed)
Will need an appt to discuss with provider

## 2021-04-07 ENCOUNTER — Ambulatory Visit (HOSPITAL_COMMUNITY)
Admission: RE | Admit: 2021-04-07 | Discharge: 2021-04-07 | Disposition: A | Discharge status: Home / Self Care | Payer: Medicare HMO | Source: Ambulatory Visit | Attending: Internal Medicine | Admitting: Internal Medicine

## 2021-04-07 ENCOUNTER — Encounter: Payer: Self-pay | Admitting: Internal Medicine

## 2021-04-07 ENCOUNTER — Ambulatory Visit (INDEPENDENT_AMBULATORY_CARE_PROVIDER_SITE_OTHER): Payer: Medicare HMO | Admitting: Internal Medicine

## 2021-04-07 ENCOUNTER — Other Ambulatory Visit: Payer: Self-pay

## 2021-04-07 VITALS — BP 102/62 | HR 63 | Resp 18 | Ht 66.0 in | Wt 209.0 lb

## 2021-04-07 DIAGNOSIS — R928 Other abnormal and inconclusive findings on diagnostic imaging of breast: Secondary | ICD-10-CM | POA: Insufficient documentation

## 2021-04-07 DIAGNOSIS — S335XXA Sprain of ligaments of lumbar spine, initial encounter: Secondary | ICD-10-CM | POA: Diagnosis not present

## 2021-04-07 DIAGNOSIS — G894 Chronic pain syndrome: Secondary | ICD-10-CM

## 2021-04-07 DIAGNOSIS — Z2821 Immunization not carried out because of patient refusal: Secondary | ICD-10-CM | POA: Diagnosis not present

## 2021-04-07 DIAGNOSIS — R922 Inconclusive mammogram: Secondary | ICD-10-CM | POA: Diagnosis not present

## 2021-04-07 DIAGNOSIS — M48062 Spinal stenosis, lumbar region with neurogenic claudication: Secondary | ICD-10-CM

## 2021-04-07 MED ORDER — PREDNISONE 10 MG (21) PO TBPK: ORAL_TABLET | ORAL | 0 refills | Status: DC

## 2021-04-07 MED ORDER — KETOROLAC TROMETHAMINE 60 MG/2ML IM SOLN
60.0000 mg | Freq: Once | INTRAMUSCULAR | Status: AC
  Administered 2021-04-07: 60 mg via INTRAMUSCULAR

## 2021-04-07 MED ORDER — CYCLOBENZAPRINE HCL 10 MG PO TABS: 10.0000 mg | ORAL_TABLET | Freq: Two times a day (BID) | ORAL | 0 refills | Status: DC | PRN

## 2021-04-07 MED ORDER — METHYLPREDNISOLONE ACETATE 80 MG/ML IJ SUSP
80.0000 mg | Freq: Once | INTRAMUSCULAR | Status: AC
  Administered 2021-04-07: 80 mg via INTRAMUSCULAR

## 2021-04-07 NOTE — Progress Notes (Signed)
Acute Office Visit  Subjective:    Patient ID: Paula Long, female    DOB: November 08, 1950, 71 y.o.   MRN: [id number]  Chief Complaint  Patient presents with   Back Pain    Pt having back pain has had multiple back surgeries in the past however 04-05-21 she was reaching in clothes basket and felt something pull this is lower back     HPI Patient is in today for complaint of acute on chronic low back pain.  She was trying to reach her close basket by bending on 01/17, and felt a pulling sensation in her lower back.  She has had 8-10/10 low back pain, which is worse with movement and walking, radiating to bilateral LE with no recent change in numbness or tingling.  Of note, she has a history of lumbar spinal stenosis s/p lumbar fusion surgery in the past.  She used to get Dilaudid for her chronic low back pain by pain clinic[facility].  She was switched to tramadol and she did not prefer it and has stopped seeing pain clinic now.  Past Medical History:  Diagnosis Date   Anxiety    Arthritis 12-07-11   arthritis,DDD,spinal stenosis   Chronic back pain    COPD (chronic obstructive pulmonary disease) ([place]) 12-07-11   pt. uses nebulizer as needed and inhaler daily   Fibromyalgia    skin cancer   GERD (gastroesophageal reflux disease)    Hypertension 12-07-11   presently no meds   Normal cardiac stress test 06/2009   Skin cancer    melanoma.  face 2014   TIA (transient ischemic attack) ?    Past Surgical History:  Procedure Laterality Date   ABDOMINAL HYSTERECTOMY     BACK SURGERY  12-07-11   2'10 L5 x2   COLONOSCOPY WITH PROPOFOL N/A 06/03/2014   Procedure: COLONOSCOPY WITH PROPOFOL; IN CECUM AT 0750; TOTAL WITHDRAWAL TIME 16 MINUTES;  Surgeon: Rogene Houston, MD;  Location: [facility];  Service: Endoscopy;  Laterality: N/A;   left ankle surgery     tendonitis   LUMBAR LAMINECTOMY/DECOMPRESSION MICRODISCECTOMY  12/11/2011   Procedure: LUMBAR LAMINECTOMY/DECOMPRESSION  MICRODISCECTOMY;  Surgeon: Tobi Bastos, MD;  Location: [facility];  Service: Orthopedics;  Laterality: Left;  Decompressive Lumbar Laminectomy L5-S1 on Left   POLYPECTOMY N/A 06/03/2014   Procedure: POLYPECTOMY;  Surgeon: Rogene Houston, MD;  Location: [facility];  Service: Endoscopy;  Laterality: N/A;   right wrist surgery for pinched nerve     trigger finger surgery  12-07-11   rt. middle trigger finger release    Family History  Problem Relation Age of Onset   Cancer Mother        uterine   Cancer Father        lung    Social History   Socioeconomic History   Marital status: Single    Spouse name: Not on file   Number of children: Not on file   Years of education: Not on file   Highest education level: Not on file  Occupational History   Not on file  Tobacco Use   Smoking status: Every Day    Packs/day: 1.50    Years: 48.00    Pack years: 72.00    Types: Cigarettes    Start date: 01/07/1970   Smokeless tobacco: Never   Tobacco comments:    gone down to 1 pack per day   Vaping Use   Vaping Use: Never used  Substance and Sexual  Activity   Alcohol use: No    Alcohol/week: 0.0 standard drinks   Drug use: No   Sexual activity: Yes    Birth control/protection: Surgical  Other Topics Concern   Not on file  Social History Narrative   Not on file   Social Determinants of Health   Financial Resource Strain: Low Risk    Difficulty of Paying Living Expenses: Not hard at all  Food Insecurity: No Food Insecurity   Worried About Charity fundraiser in the Last Year: Never true   [place] in the Last Year: Never true  Transportation Needs: No Transportation Needs   Lack of Transportation (Medical): No   Lack of Transportation (Non-Medical): No  Physical Activity: Inactive   Days of Exercise per Week: 0 days   Minutes of Exercise per Session: 0 min  Stress: No Stress Concern Present   Feeling of Stress : Not at all  Social Connections: Socially Isolated    Frequency of Communication with Friends and Family: More than three times a week   Frequency of Social Gatherings with Friends and Family: More than three times a week   Attends Religious Services: Never   Marine scientist or Organizations: No   Attends Music therapist: Never   Marital Status: Divorced  Human resources officer Violence: Not At Risk   Fear of Current or Ex-Partner: No   Emotionally Abused: No   Physically Abused: No   Sexually Abused: No    Outpatient Medications Prior to Visit  Medication Sig Dispense Refill   albuterol (PROVENTIL) (2.5 MG/3ML) 0.083% nebulizer solution INHALE THE CONTENTS OF 1 VIAL VIA NEBULIZER EVERY 4 HOURS AS NEEDED FOR WHEEZING OR SHORTNESS OF BREATH 90 mL 1   albuterol (VENTOLIN HFA) 108 (90 Base) MCG/ACT inhaler INHALE 2 PUFFS EVERY 4 HOURS AS NEEDED FOR WHEEZING OR SHORTNESS OF BREATH 18 g 2   amLODipine (NORVASC) 10 MG tablet Take 1 tablet (10 mg total) by mouth daily. 90 tablet 1   atorvastatin (LIPITOR) 10 MG tablet TAKE 1 TABLET BY MOUTH ONCE DAILY AT 6:00 PM. 90 tablet 3   carbamide peroxide (DEBROX) 6.5 % OTIC solution Place 5 drops into both ears 2 (two) times daily. 15 mL 0   desmopressin (DDAVP) 0.2 MG tablet TAKE 1 TABLET BY MOUTH ONCE AT BEDTIME FOR BLADDER. 90 tablet 3   DEXILANT 60 MG capsule TAKE 1 CAPSULE BY MOUTH DAILY. 90 capsule 3   DULoxetine (CYMBALTA) 30 MG capsule Cymbalta 30 mg capsule,delayed release  Take 2 capsules every day by oral route for 30 days.     EASYMAX TEST test strip USE TO TEST ONCE DAILY. 100 each 3   ELIQUIS 5 MG TABS tablet TAKE (1) TABLET BY MOUTH TWICE DAILY. 60 tablet 0   Fluocinolone Acetonide Body (DERMA-SMOOTHE/FS BODY) 0.01 % OIL Apply to scalp daily a three times a week 120 mL 1   furosemide (LASIX) 40 MG tablet TAKE 1 TABLET TWICE DAILY 180 tablet 1   hydrALAZINE (APRESOLINE) 50 MG tablet Take 2 tablets (100 mg total) by mouth 3 (three) times daily. 540 tablet 3   labetalol  (NORMODYNE) 200 MG tablet Take 1 tablet (200 mg total) by mouth 2 (two) times daily. 180 tablet 1   Magnesium 400 MG CAPS Take 1 capsule by mouth daily. 90 capsule 1   meclizine (ANTIVERT) 25 MG tablet Take 1 tablet (25 mg total) by mouth 3 (three) times daily as needed for dizziness.  30 tablet 0   nystatin (MYCOSTATIN/NYSTOP) powder Apply topically 2 (two) times daily as needed. 60 g 2   traZODone (DESYREL) 150 MG tablet Take 1 tablet (150 mg total) by mouth at bedtime. 90 tablet 1   HYDROmorphone (DILAUDID) 2 MG tablet Take 1 mg by mouth 2 (two) times daily.      nitroGLYCERIN (NITROSTAT) 0.4 MG SL tablet Place 1 tablet (0.4 mg total) under the tongue every 5 (five) minutes as needed for chest pain. 25 tablet 3   No facility-administered medications prior to visit.    Allergies  Allergen Reactions   Hydrocodone-Acetaminophen    Oxycodone-Acetaminophen    Sulfa Antibiotics    Nystatin Rash    Oral rash   Percocet [Oxycodone-Acetaminophen] Rash    Oral rash   Vesicare [Solifenacin] Rash   Vicodin [Hydrocodone-Acetaminophen] Rash    Oral rash    Review of Systems  Constitutional:  Negative for chills and fever.  HENT:  Negative for congestion, sinus pressure, sinus pain and sore throat.   Eyes:  Negative for pain and discharge.  Respiratory:  Negative for cough and shortness of breath.   Cardiovascular:  Negative for chest pain and palpitations.  Gastrointestinal:  Negative for abdominal pain, constipation, diarrhea, nausea and vomiting.  Endocrine: Negative for polydipsia and polyuria.  Genitourinary:  Negative for dysuria and hematuria.  Musculoskeletal:  Positive for arthralgias and back pain. Negative for neck pain and neck stiffness.  Skin:  Negative for rash.  Neurological:  Negative for dizziness, syncope, weakness and headaches.  Psychiatric/Behavioral:  Positive for sleep disturbance. Negative for agitation and behavioral problems. The patient is not nervous/anxious.        Objective:    Physical Exam Vitals reviewed.  Constitutional:      General: She is not in acute distress.    Appearance: She is not diaphoretic.  HENT:     Head: Normocephalic and atraumatic.     Nose: Nose normal.     Mouth/Throat:     Mouth: Mucous membranes are moist.  Eyes:     General: No scleral icterus.    Extraocular Movements: Extraocular movements intact.     Pupils: Pupils are equal, round, and reactive to light.  Cardiovascular:     Rate and Rhythm: Normal rate and regular rhythm.     Pulses: Normal pulses.     Heart sounds: Murmur (Systolic, right upper sternal border) heard.  Pulmonary:     Breath sounds: Normal breath sounds. No wheezing or rales.  Musculoskeletal:        General: Tenderness (Lumbar spinal area) present.     Cervical back: Neck supple. No tenderness.     Right lower leg: No edema.     Left lower leg: No edema.  Skin:    General: Skin is warm.     Findings: No rash.  Neurological:     General: No focal deficit present.     Mental Status: She is alert and oriented to person, place, and time.  Psychiatric:        Mood and Affect: Mood normal.        Behavior: Behavior normal.    BP 102/62 (BP Location: Left Arm, Patient Position: Sitting, Cuff Size: Normal)    Pulse 63    Resp 18    Ht 5\' 6"  (1.676 m)    Wt 209 lb 0.6 oz (94.8 kg)    SpO2 96%    BMI 33.74 kg/m  Wt Readings from Last 3  Encounters:  04/07/21 209 lb 0.6 oz (94.8 kg)  02/28/21 206 lb (93.4 kg)  10/19/20 194 lb 1.9 oz (88.1 kg)        Assessment & Plan:   Problem List Items Addressed This Visit       Other   Spinal stenosis, lumbar region, with neurogenic claudication - Primary    S/p lumbar spine surgeries Acute low back pain due to strain from bending -Depo-Medrol and Toradol given in the office today, followed by Sterapred Dosepak Has chronic low back pain, used to see [facility] Referred to [facility] pain clinic      Relevant Medications    predniSONE (STERAPRED UNI-PAK 21 TAB) 10 MG (21) TBPK tablet   cyclobenzaprine (FLEXERIL) 10 MG tablet   Chronic pain syndrome    Was on chronic opioid treatment Referred to pain clinic in [place]      Relevant Medications   predniSONE (STERAPRED UNI-PAK 21 TAB) 10 MG (21) TBPK tablet   cyclobenzaprine (FLEXERIL) 10 MG tablet   Other Relevant Orders   Ambulatory referral to Pain Clinic   Other Visit Diagnoses     Influenza vaccine refused       Lumbar sprain, initial encounter   Likely from bending activity Depo-Medrol and Toradol given in the office today Flexeril as needed for muscle spasms Avoid heavy lifting and frequent bending   Relevant Medications   cyclobenzaprine (FLEXERIL) 10 MG tablet   methylPREDNISolone acetate (DEPO-MEDROL) injection 80 mg (Completed)   ketorolac (TORADOL) injection 60 mg (Completed)        Meds ordered this encounter  Medications   predniSONE (STERAPRED UNI-PAK 21 TAB) 10 MG (21) TBPK tablet    Sig: Take as package instructions.    Dispense:  1 each    Refill:  0   cyclobenzaprine (FLEXERIL) 10 MG tablet    Sig: Take 1 tablet (10 mg total) by mouth 2 (two) times daily between meals as needed for muscle spasms.    Dispense:  30 tablet    Refill:  0   methylPREDNISolone acetate (DEPO-MEDROL) injection 80 mg   ketorolac (TORADOL) injection 60 mg     Lindell Spar, MD

## 2021-04-07 NOTE — Assessment & Plan Note (Addendum)
S/p lumbar spine surgeries Acute low back pain due to strain from bending -Depo-Medrol and Toradol given in the office today, followed by Sterapred Dosepak Has chronic low back pain, used to see [facility] Referred to [facility] pain clinic

## 2021-04-07 NOTE — Patient Instructions (Signed)
Please start taking Prednisone as prescribed.  Please take Cyclobenzaprine as prescribed for muscle spasms.  You are being referred to pain clinic.

## 2021-04-07 NOTE — Assessment & Plan Note (Signed)
Was on chronic opioid treatment Referred to pain clinic in [facility]

## 2021-04-11 ENCOUNTER — Ambulatory Visit (HOSPITAL_COMMUNITY): Payer: Medicare HMO

## 2021-04-25 ENCOUNTER — Other Ambulatory Visit: Payer: Self-pay | Admitting: Internal Medicine

## 2021-04-29 ENCOUNTER — Encounter: Payer: Self-pay | Admitting: *Deleted

## 2021-05-05 ENCOUNTER — Other Ambulatory Visit: Payer: Self-pay | Admitting: Internal Medicine

## 2021-05-05 DIAGNOSIS — M48062 Spinal stenosis, lumbar region with neurogenic claudication: Secondary | ICD-10-CM

## 2021-05-05 DIAGNOSIS — S335XXA Sprain of ligaments of lumbar spine, initial encounter: Secondary | ICD-10-CM

## 2021-05-05 DIAGNOSIS — F5104 Psychophysiologic insomnia: Secondary | ICD-10-CM

## 2021-05-08 IMAGING — MR MRA HEAD WITHOUT CONTRAST
1 series · 16 of 48 positions shown · non-contrast
Comparison: Brain MRI from yesterday

CLINICAL DATA: Generalized weakness with blurred vision

EXAM:
MRA HEAD WITHOUT CONTRAST
TECHNIQUE: Angiographic images of the Circle of [place] were obtained using MRA
technique without intravenous contrast.

[Series 3: TOF fat-sat · axial · 0.8mm · 0.38mm/px · z∈[-16,+80]mm · 16 of 131 slices shown]
[im 1/131]
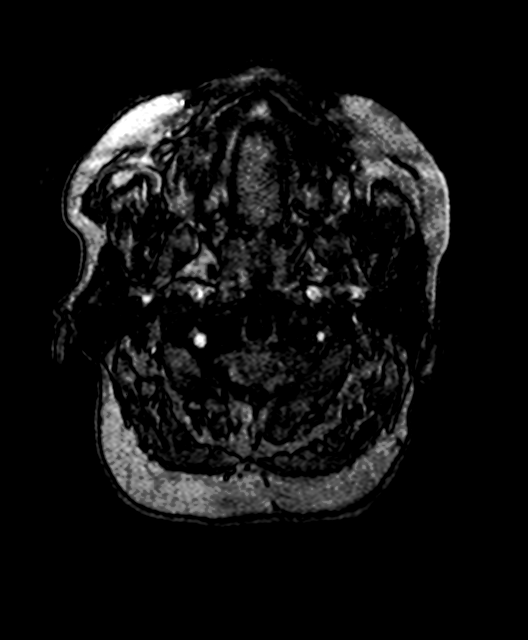
[im 3/131]
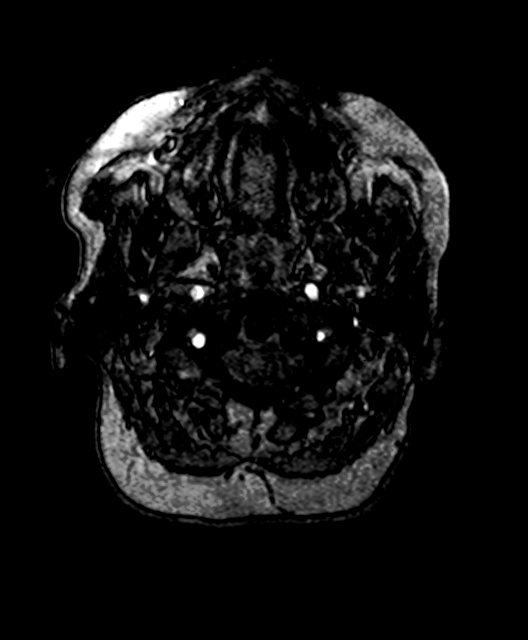
[im 6/131]
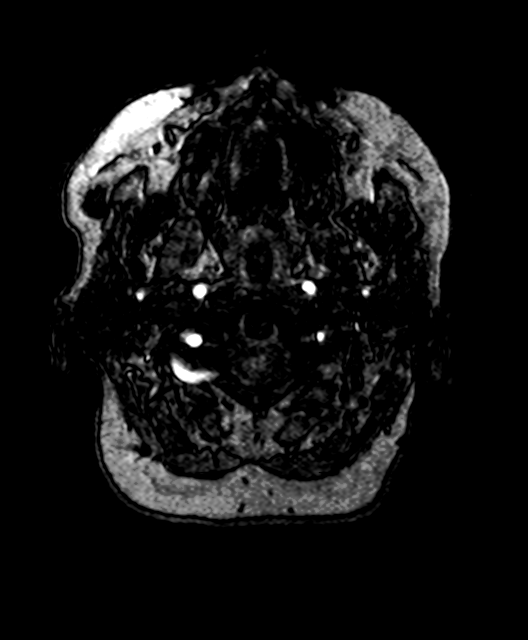
[im 9/131]
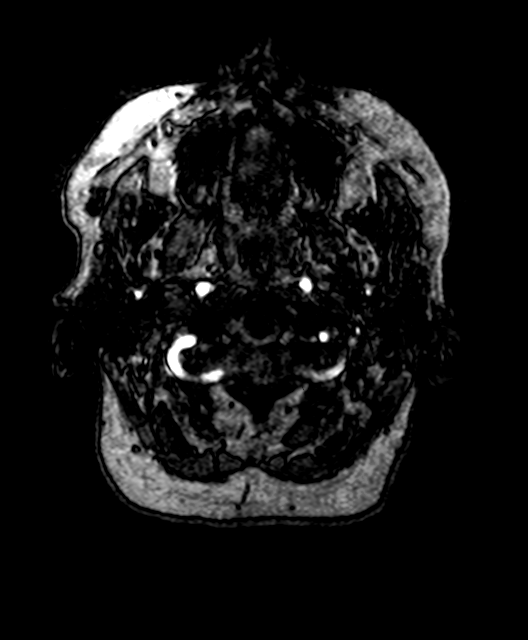
[im 12/131]
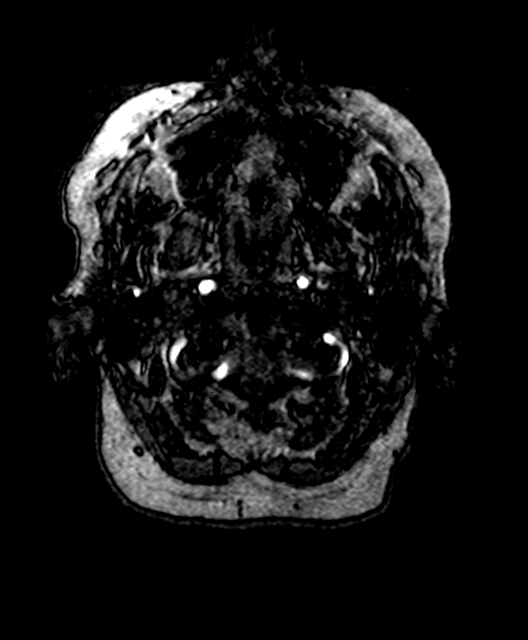
[im 14/131]
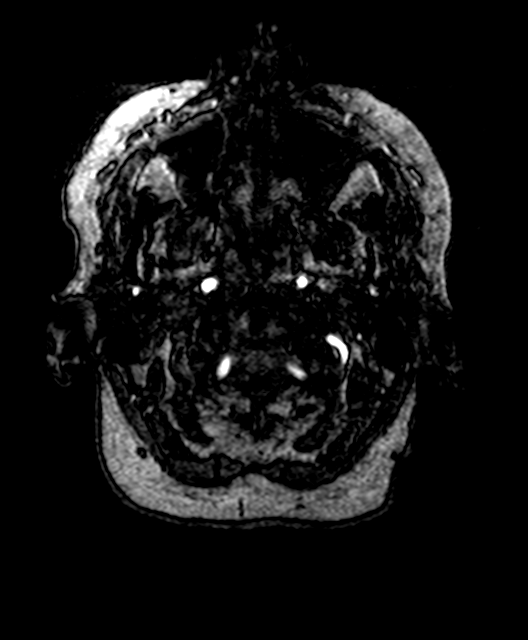
[im 23/131]
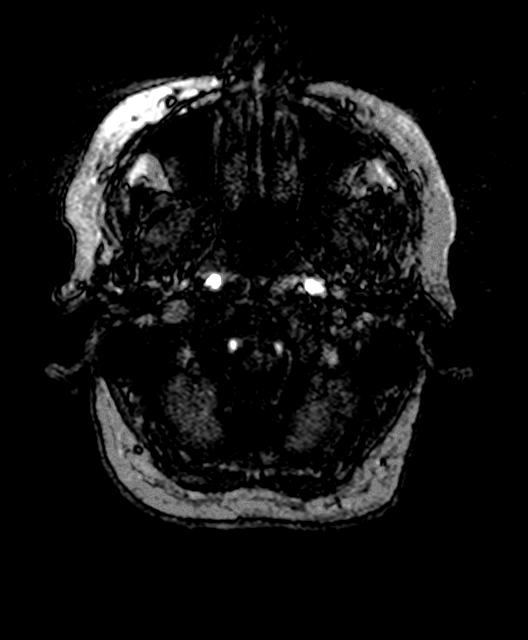
[im 25/131]
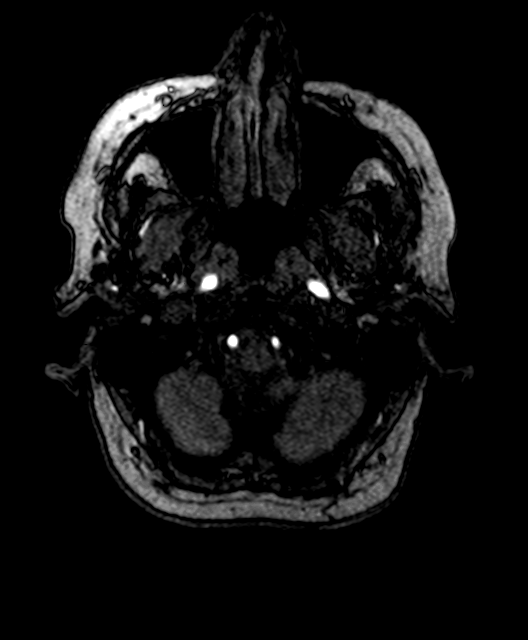
[im 42/131]
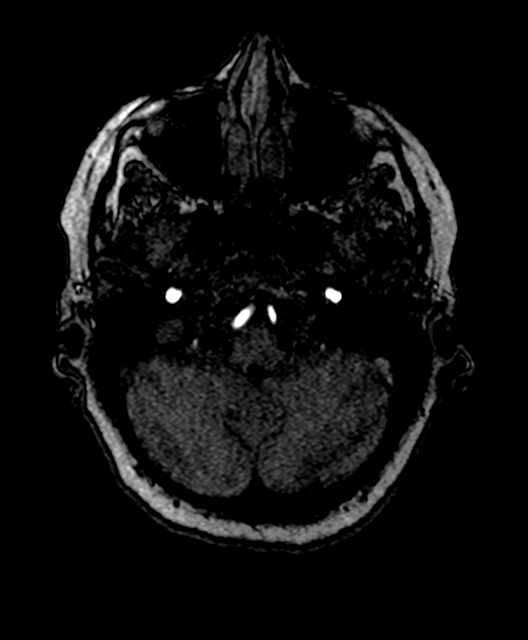
[im 59/131]
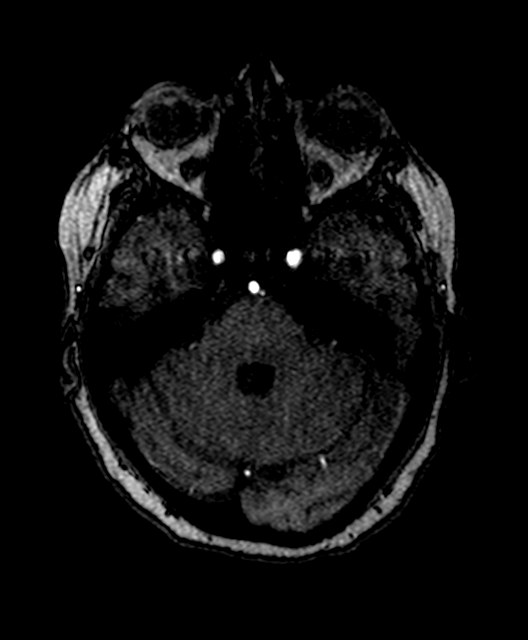
[im 67/131]
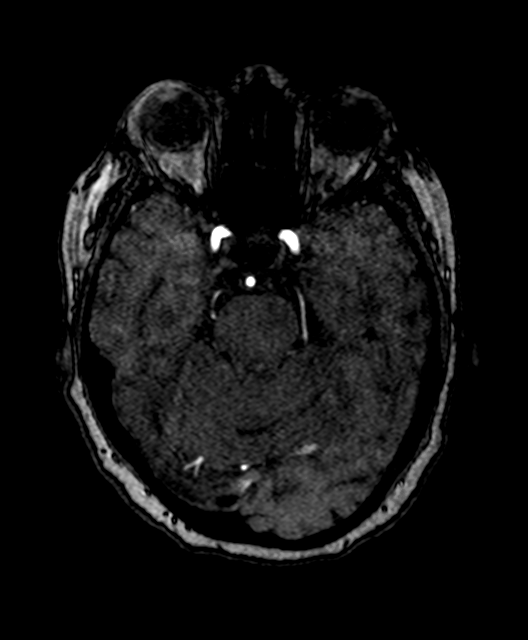
[im 75/131]
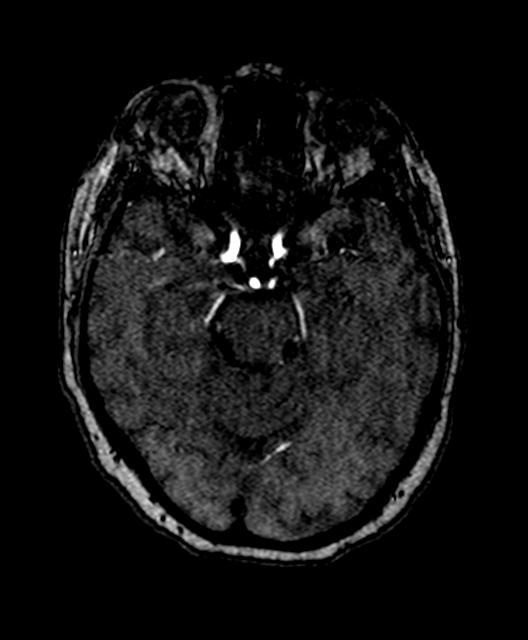
[im 92/131]
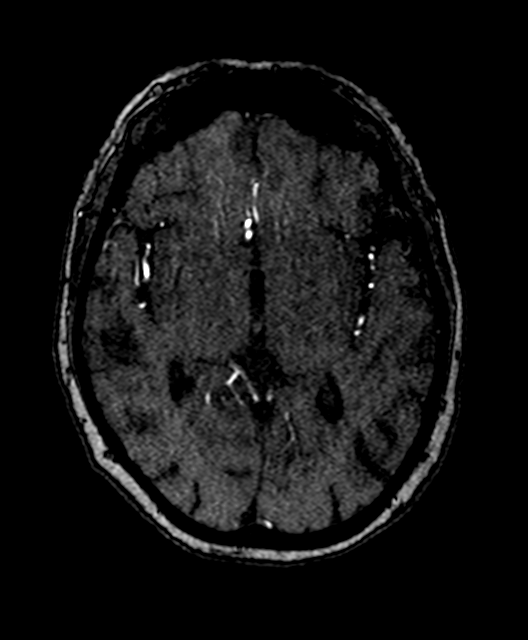
[im 108/131]
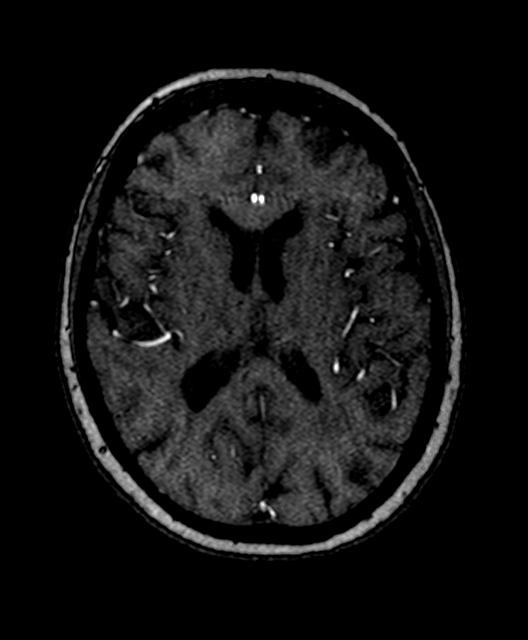
[im 111/131]
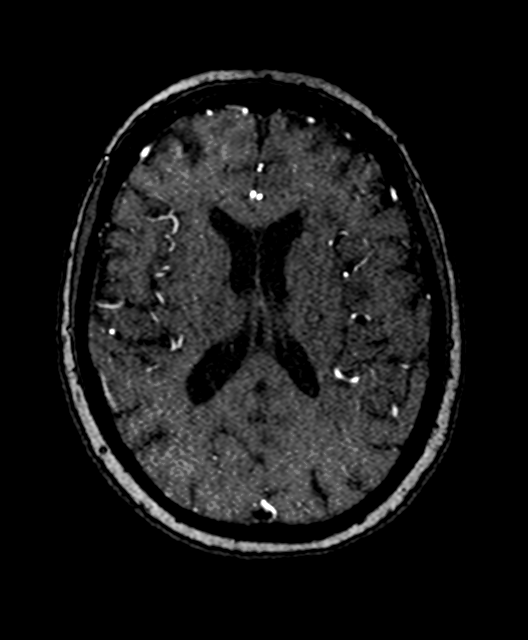
[im 125/131]
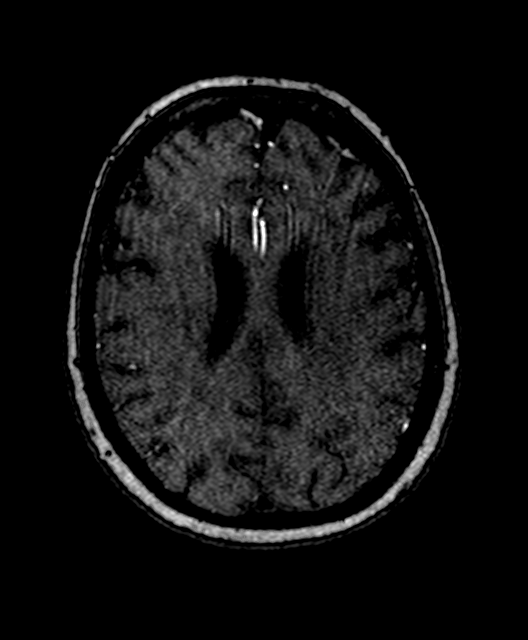

[16 of 48 positions shown; findings below may reference images not displayed]

FINDINGS: Carotid and vertebral arteries are smooth and widely patent. No
branch occlusion, beading, or aneurysm. Moderate narrowing at the
right P2 segment, presumably atheromatous. There is generalized
vessel blurring from artifact.
IMPRESSION: No emergent finding.

Moderate atheromatous type right P2 segment stenosis.

## 2021-05-25 ENCOUNTER — Ambulatory Visit (INDEPENDENT_AMBULATORY_CARE_PROVIDER_SITE_OTHER): Payer: Medicare HMO | Admitting: Internal Medicine

## 2021-05-25 ENCOUNTER — Other Ambulatory Visit: Payer: Self-pay

## 2021-05-25 ENCOUNTER — Encounter: Payer: Self-pay | Admitting: Internal Medicine

## 2021-05-25 VITALS — BP 138/82 | HR 83 | Resp 18 | Ht 65.0 in | Wt 212.0 lb

## 2021-05-25 DIAGNOSIS — J418 Mixed simple and mucopurulent chronic bronchitis: Secondary | ICD-10-CM | POA: Diagnosis not present

## 2021-05-25 DIAGNOSIS — I10 Essential (primary) hypertension: Secondary | ICD-10-CM | POA: Diagnosis not present

## 2021-05-25 DIAGNOSIS — I4892 Unspecified atrial flutter: Secondary | ICD-10-CM | POA: Diagnosis not present

## 2021-05-25 DIAGNOSIS — Z0001 Encounter for general adult medical examination with abnormal findings: Secondary | ICD-10-CM

## 2021-05-25 DIAGNOSIS — E782 Mixed hyperlipidemia: Secondary | ICD-10-CM

## 2021-05-25 DIAGNOSIS — M48062 Spinal stenosis, lumbar region with neurogenic claudication: Secondary | ICD-10-CM | POA: Diagnosis not present

## 2021-05-25 DIAGNOSIS — R7303 Prediabetes: Secondary | ICD-10-CM

## 2021-05-25 DIAGNOSIS — S335XXA Sprain of ligaments of lumbar spine, initial encounter: Secondary | ICD-10-CM

## 2021-05-25 DIAGNOSIS — N1832 Chronic kidney disease, stage 3b: Secondary | ICD-10-CM | POA: Diagnosis not present

## 2021-05-25 MED ORDER — AMLODIPINE BESYLATE 10 MG PO TABS: 10.0000 mg | ORAL_TABLET | Freq: Every day | ORAL | 3 refills | Status: DC

## 2021-05-25 MED ORDER — ATORVASTATIN CALCIUM 10 MG PO TABS: 10.0000 mg | ORAL_TABLET | Freq: Every day | ORAL | 3 refills | Status: DC

## 2021-05-25 MED ORDER — KETOROLAC TROMETHAMINE 60 MG/2ML IM SOLN
60.0000 mg | Freq: Once | INTRAMUSCULAR | Status: AC
  Administered 2021-05-25: 60 mg via INTRAMUSCULAR

## 2021-05-25 MED ORDER — METHYLPREDNISOLONE ACETATE 80 MG/ML IJ SUSP
80.0000 mg | Freq: Once | INTRAMUSCULAR | Status: AC
  Administered 2021-05-25: 80 mg via INTRAMUSCULAR

## 2021-05-25 NOTE — Progress Notes (Signed)
Established Patient Office Visit  Subjective:  Patient ID: Paula Long, female    DOB: 1950-04-13  Age: 71 y.o. MRN: [id number]  CC:  Chief Complaint  Patient presents with   Annual Exam    Annual exam both pt legs are hurting and are very weak can hardly walk now     HPI Paula Long is a 71 y.o. 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  She complains of chronic low back pain, radiating to bilateral legs with numbness and weakness of the LE.  She had been following up with Dr. Dr. [name] for pain management, and used to get Dilaudid, but was later switched to tramadol, which did not help her and she stopped seeing him.  She had been referred to [facility] pain clinic, but she has not been able to see them yet.  She denies any saddle anesthesia, urinary or stool incontinence currently.  HTN: BP is well-controlled. Takes medications regularly. Patient denies headache, dizziness, chest pain, dyspnea or palpitations.   A Flutter: Denies dyspnea or palpitations. Takes Eliquis regularly, although mentions having cold sensation while she takes it.   Past Medical History:  Diagnosis Date   Anxiety    Arthritis 12-07-11   arthritis,DDD,spinal stenosis   Chronic back pain    COPD (chronic obstructive pulmonary disease) ([place]) 12-07-11   pt. uses nebulizer as needed and inhaler daily   Fibromyalgia    skin cancer   GERD (gastroesophageal reflux disease)    Hypertension 12-07-11   presently no meds   Normal cardiac stress test 06/2009   Skin cancer    melanoma.  face 2014   TIA (transient ischemic attack) ?    Past Surgical History:  Procedure Laterality Date   ABDOMINAL HYSTERECTOMY     BACK SURGERY  12-07-11   2'10 L5 x2   COLONOSCOPY WITH PROPOFOL N/A  06/03/2014   Procedure: COLONOSCOPY WITH PROPOFOL; IN CECUM AT 0750; TOTAL WITHDRAWAL TIME 16 MINUTES;  Surgeon: Rogene Houston, MD;  Location: [facility];  Service: Endoscopy;  Laterality: N/A;   left ankle surgery     tendonitis   LUMBAR LAMINECTOMY/DECOMPRESSION MICRODISCECTOMY  12/11/2011   Procedure: LUMBAR LAMINECTOMY/DECOMPRESSION MICRODISCECTOMY;  Surgeon: Tobi Bastos, MD;  Location: [facility];  Service: Orthopedics;  Laterality: Left;  Decompressive Lumbar Laminectomy L5-S1 on Left   POLYPECTOMY N/A 06/03/2014   Procedure: POLYPECTOMY;  Surgeon: Rogene Houston, MD;  Location: [facility];  Service: Endoscopy;  Laterality: N/A;   right wrist surgery for pinched nerve     trigger finger surgery  12-07-11   rt. middle trigger finger release    Family History  Problem Relation Age of Onset   Cancer Mother        uterine   Cancer Father        lung    Social History   Socioeconomic History   Marital status: Single    Spouse name: Not on file   Number of children: Not on file   Years of education: Not on file   Highest education level: Not on file  Occupational History   Not on file  Tobacco Use   Smoking status: Every Day    Packs/day: 1.50    Years: 48.00    Pack years: 72.00  Types: Cigarettes    Start date: 01/07/1970   Smokeless tobacco: Never   Tobacco comments:    gone down to 1 pack per day   Vaping Use   Vaping Use: Never used  Substance and Sexual Activity   Alcohol use: No    Alcohol/week: 0.0 standard drinks   Drug use: No   Sexual activity: Yes    Birth control/protection: Surgical  Other Topics Concern   Not on file  Social History Narrative   Not on file   Social Determinants of Health   Financial Resource Strain: Low Risk    Difficulty of Paying Living Expenses: Not hard at all  Food Insecurity: No Food Insecurity   Worried About Charity fundraiser in the Last Year: Never true   Ran Out of Food in the Last Year: Never true  Transportation  Needs: No Transportation Needs   Lack of Transportation (Medical): No   Lack of Transportation (Non-Medical): No  Physical Activity: Inactive   Days of Exercise per Week: 0 days   Minutes of Exercise per Session: 0 min  Stress: No Stress Concern Present   Feeling of Stress : Not at all  Social Connections: Socially Isolated   Frequency of Communication with Friends and Family: More than three times a week   Frequency of Social Gatherings with Friends and Family: More than three times a week   Attends Religious Services: Never   Marine scientist or Organizations: No   Attends Music therapist: Never   Marital Status: Divorced  Human resources officer Violence: Not At Risk   Fear of Current or Ex-Partner: No   Emotionally Abused: No   Physically Abused: No   Sexually Abused: No    Outpatient Medications Prior to Visit  Medication Sig Dispense Refill   albuterol (PROVENTIL) (2.5 MG/3ML) 0.083% nebulizer solution INHALE THE CONTENTS OF 1 VIAL VIA NEBULIZER EVERY 4 HOURS AS NEEDED FOR WHEEZING OR SHORTNESS OF BREATH 90 mL 1   albuterol (VENTOLIN HFA) 108 (90 Base) MCG/ACT inhaler INHALE 2 PUFFS EVERY 4 HOURS AS NEEDED FOR WHEEZING OR SHORTNESS OF BREATH 18 g 2   carbamide peroxide (DEBROX) 6.5 % OTIC solution Place 5 drops into both ears 2 (two) times daily. 15 mL 0   cyclobenzaprine (FLEXERIL) 10 MG tablet TAKE 1 TABLET BY MOUTH TWICE DAILY BETWEEN MEALS AS NEEDED FOR MUSCLE SPASMS. 30 tablet 0   desmopressin (DDAVP) 0.2 MG tablet TAKE 1 TABLET BY MOUTH ONCE AT BEDTIME FOR BLADDER. 90 tablet 3   DEXILANT 60 MG capsule TAKE 1 CAPSULE BY MOUTH DAILY. 90 capsule 3   DULoxetine (CYMBALTA) 30 MG capsule Cymbalta 30 mg capsule,delayed release  Take 2 capsules every day by oral route for 30 days.     EASYMAX TEST test strip USE TO TEST ONCE DAILY. 100 each 3   ELIQUIS 5 MG TABS tablet TAKE (1) TABLET BY MOUTH TWICE DAILY. 60 tablet 0   Fluocinolone Acetonide Body (DERMA-SMOOTHE/FS  BODY) 0.01 % OIL Apply to scalp daily a three times a week 120 mL 1   furosemide (LASIX) 40 MG tablet TAKE 1 TABLET TWICE DAILY 180 tablet 1   hydrALAZINE (APRESOLINE) 50 MG tablet Take 2 tablets (100 mg total) by mouth 3 (three) times daily. 540 tablet 3   labetalol (NORMODYNE) 200 MG tablet Take 1 tablet (200 mg total) by mouth 2 (two) times daily. 180 tablet 1   Magnesium 400 MG CAPS Take 1 capsule by mouth daily.  90 capsule 1   meclizine (ANTIVERT) 25 MG tablet Take 1 tablet (25 mg total) by mouth 3 (three) times daily as needed for dizziness. 30 tablet 0   nystatin (MYCOSTATIN/NYSTOP) powder Apply topically 2 (two) times daily as needed. 60 g 2   traZODone (DESYREL) 150 MG tablet TAKE (1) TABLET BY MOUTH AT BEDTIME. 90 tablet 0   amLODipine (NORVASC) 10 MG tablet Take 1 tablet (10 mg total) by mouth daily. 90 tablet 1   atorvastatin (LIPITOR) 10 MG tablet TAKE 1 TABLET BY MOUTH ONCE DAILY AT 6:00 PM. 90 tablet 3   predniSONE (STERAPRED UNI-PAK 21 TAB) 10 MG (21) TBPK tablet Take as package instructions. 1 each 0   nitroGLYCERIN (NITROSTAT) 0.4 MG SL tablet Place 1 tablet (0.4 mg total) under the tongue every 5 (five) minutes as needed for chest pain. 25 tablet 3   No facility-administered medications prior to visit.    Allergies  Allergen Reactions   Hydrocodone-Acetaminophen    Oxycodone-Acetaminophen    Sulfa Antibiotics    Nystatin Rash    Oral rash   Percocet [Oxycodone-Acetaminophen] Rash    Oral rash   Vesicare [Solifenacin] Rash   Vicodin [Hydrocodone-Acetaminophen] Rash    Oral rash    ROS Review of Systems  Constitutional:  Negative for chills and fever.  HENT:  Negative for congestion, sinus pressure, sinus pain and sore throat.   Eyes:  Negative for pain and discharge.  Respiratory:  Negative for cough and shortness of breath.   Cardiovascular:  Negative for chest pain and palpitations.  Gastrointestinal:  Negative for abdominal pain, constipation, diarrhea,  nausea and vomiting.  Endocrine: Negative for polydipsia and polyuria.  Genitourinary:  Negative for dysuria and hematuria.  Musculoskeletal:  Positive for arthralgias and back pain. Negative for neck pain and neck stiffness.  Skin:  Negative for rash.  Neurological:  Positive for weakness. Negative for dizziness, syncope and headaches.  Psychiatric/Behavioral:  Positive for sleep disturbance. Negative for agitation and behavioral problems. The patient is not nervous/anxious.      Objective:    Physical Exam Vitals reviewed.  Constitutional:      General: She is not in acute distress.    Appearance: She is not diaphoretic.  HENT:     Head: Normocephalic and atraumatic.     Nose: Nose normal.     Mouth/Throat:     Mouth: Mucous membranes are moist.  Eyes:     General: No scleral icterus.    Extraocular Movements: Extraocular movements intact.  Cardiovascular:     Rate and Rhythm: Normal rate and regular rhythm.     Pulses: Normal pulses.     Heart sounds: Murmur (Systolic, right upper sternal border) heard.  Pulmonary:     Breath sounds: Normal breath sounds. No wheezing or rales.  Abdominal:     Palpations: Abdomen is soft.     Tenderness: There is no abdominal tenderness.  Musculoskeletal:        General: Tenderness (Lumbar spinal area) present.     Cervical back: Neck supple. No tenderness.     Right lower leg: No edema.     Left lower leg: No edema.  Skin:    General: Skin is warm.     Findings: No rash.  Neurological:     General: No focal deficit present.     Mental Status: She is alert and oriented to person, place, and time.     Sensory: No sensory deficit.     Motor:  3/5 in b/l LE, 4/5 in b/l UE) present.  °Psychiatric:     °   Mood and Affect: Mood normal.     °   Behavior: Behavior normal.  ° ° °BP 138/82 (BP Location: Left Arm, Patient Position: Sitting, Cuff Size: Normal)    Pulse 83    Resp 18    Ht 5' 5" (1.651 m)    Wt 212 lb (96.2 kg)    SpO2 96%     BMI 35.28 kg/m²  °Wt Readings from Last 3 Encounters:  °05/25/21 212 lb (96.2 kg)  °04/07/21 209 lb 0.6 oz (94.8 kg)  °02/28/21 206 lb (93.4 kg)  ° ° °Lab Results  °Component Value Date  ° TSH 1.571 09/30/2018  ° °Lab Results  °Component Value Date  ° WBC 7.2 12/15/2020  ° HGB 12.7 12/15/2020  ° HCT 38.3 12/15/2020  ° MCV 92 12/15/2020  ° PLT 260 12/15/2020  ° °Lab Results  °Component Value Date  ° NA 136 12/15/2020  ° K 5.0 12/15/2020  ° CO2 23 12/15/2020  ° GLUCOSE 97 12/15/2020  ° BUN 21 12/15/2020  ° CREATININE 1.59 (H) 12/15/2020  ° BILITOT 0.4 03/09/2020  ° ALKPHOS 59 03/09/2020  ° AST 19 03/09/2020  ° ALT 14 03/09/2020  ° PROT 7.2 03/09/2020  ° ALBUMIN 4.0 03/09/2020  ° CALCIUM 9.3 12/15/2020  ° ANIONGAP 12 03/09/2020  ° EGFR 35 (L) 12/15/2020  ° °Lab Results  °Component Value Date  ° CHOL 123 12/15/2020  ° °Lab Results  °Component Value Date  ° HDL 70 12/15/2020  ° °Lab Results  °Component Value Date  ° LDLCALC 44 12/15/2020  ° °Lab Results  °Component Value Date  ° TRIG 33 12/15/2020  ° °Lab Results  °Component Value Date  ° CHOLHDL 1.8 12/15/2020  ° °Lab Results  °Component Value Date  ° HGBA1C 5.4 02/18/2020  ° ° °  °Assessment & Plan:  ° °Problem List Items Addressed This Visit   ° °  ° Encounter for general adult medical examination with abnormal findings - Primary  ° Physical exam as documented. °Fasting blood tests ordered today. °Needs to cut down to quit smoking. °Advised to get Shingrix vaccine at the local pharmacy. ° °  °  °Relevant Orders  °TSH  °CMP14+EGFR  °CBC with Differential/Platelet  ° ° °Cardiovascular and Mediastinum  ° Essential hypertension  °  BP Readings from Last 1 Encounters:  °05/25/21 138/82  °Well-controlled with Amlodipine 10 mg QD, Hydralazine 100 mg TID, Labetalol 200 mg BID and Lasix 40 mg PRN for leg swelling °Counseled for compliance with the medications °Advised DASH diet and moderate exercise/walking as tolerated °Follows up with Cardiologist °  °  ° Relevant  Medications  ° amLODipine (NORVASC) 10 MG tablet  ° atorvastatin (LIPITOR) 10 MG tablet  ° Paroxysmal atrial flutter (HCC)  °  Currently in sinus rhythm °On labetalol for rate control and HTN °On Eliquis °  °  ° Relevant Medications  ° amLODipine (NORVASC) 10 MG tablet  ° atorvastatin (LIPITOR) 10 MG tablet  °  ° Respiratory  ° COPD (chronic obstructive pulmonary disease) (HCC)  °  Well-controlled with Albuterol PRN °  °  °  ° Genitourinary  ° CKD (chronic kidney disease) stage 3, GFR 30-59 ml/min (HCC)  °  Likely due to h/o uncontrolled HTN °Avoid nephrotoxic agents including NSAIDs °Followed by nephrology °Check CMP °On Lasix as needed for leg swelling °  °  ° Relevant Orders  °   VITAMIN D 25 Hydroxy (Vit-D Deficiency, Fractures)  ° CMP14+EGFR  °  ° Other  ° Spinal stenosis, lumbar region, with neurogenic claudication  °  S/p lumbar spine surgeries °Acute low back pain due to strain from bending -Depo-Medrol and Toradol given in the office today, °Has chronic low back pain, used to see [facility] °Referred to [place] pain clinic °  °  ° Relevant Orders  ° Ambulatory referral to Pain Clinic  ° Hyperlipidemia  ° Relevant Medications  ° amLODipine (NORVASC) 10 MG tablet  ° atorvastatin (LIPITOR) 10 MG tablet  ° Morbid obesity (HCC)  °  With HTN, PVD, HLD, CKD and GERD °Advised to follow DASH diet and ambulate as tolerated °  °  ° °Other Visit Diagnoses   ° ° Prediabetes      ° Relevant Orders  ° Hemoglobin A1c  ° °  ° ° °Meds ordered this encounter  °Medications  ° amLODipine (NORVASC) 10 MG tablet  °  Sig: Take 1 tablet (10 mg total) by mouth daily.  °  Dispense:  90 tablet  °  Refill:  3  ° atorvastatin (LIPITOR) 10 MG tablet  °  Sig: Take 1 tablet (10 mg total) by mouth daily.  °  Dispense:  90 tablet  °  Refill:  3  ° methylPREDNISolone acetate (DEPO-MEDROL) injection 80 mg  ° ketorolac (TORADOL) injection 60 mg  ° ° °Follow-up: Return in about 6 months (around 11/25/2021) for HTN and HLD.  ° ° °Rutwik K  Patel, MD °

## 2021-05-25 NOTE — Patient Instructions (Addendum)
Please continue taking medications as prescribed. ? ?Please continue to follow low salt diet and ambulate as tolerated. ? ?Please take magnesium 400 mg supplement for leg cramps. ? ?You are being referred to pain clinic. ?

## 2021-05-26 ENCOUNTER — Telehealth: Payer: Self-pay | Admitting: Internal Medicine

## 2021-05-26 NOTE — Telephone Encounter (Signed)
Patient returning call.

## 2021-05-26 NOTE — Telephone Encounter (Signed)
Did not call patient please call her and find out what she needs  ?

## 2021-05-27 NOTE — Assessment & Plan Note (Signed)
With HTN, PVD, HLD, CKD and GERD ?Advised to follow DASH diet and ambulate as tolerated ?

## 2021-05-27 NOTE — Assessment & Plan Note (Signed)
Physical exam as documented. ?Fasting blood tests ordered today. ?Needs to cut down to quit smoking. ?Advised to get Shingrix vaccine at the local pharmacy. ? ?

## 2021-05-27 NOTE — Assessment & Plan Note (Signed)
Likely due to h/o uncontrolled HTN ?Avoid nephrotoxic agents including NSAIDs ?Followed by nephrology ?Check CMP ?On Lasix as needed for leg swelling ?

## 2021-05-27 NOTE — Assessment & Plan Note (Signed)
Currently in sinus rhythm On labetalol for rate control and HTN On Eliquis 

## 2021-05-27 NOTE — Assessment & Plan Note (Signed)
Well-controlled with Albuterol PRN 

## 2021-05-27 NOTE — Assessment & Plan Note (Signed)
BP Readings from Last 1 Encounters:  ?05/25/21 138/82  ? ?Well-controlled with Amlodipine 10 mg QD, Hydralazine 100 mg TID, Labetalol 200 mg BID and Lasix 40 mg PRN for leg swelling ?Counseled for compliance with the medications ?Advised DASH diet and moderate exercise/walking as tolerated ?Follows up with Cardiologist ?

## 2021-05-27 NOTE — Assessment & Plan Note (Signed)
S/p lumbar spine surgeries ?Acute low back pain due to strain from bending -Depo-Medrol and Toradol given in the office today, ?Has chronic low back pain, used to see [facility] ?Referred to [place] pain clinic ?

## 2021-06-08 ENCOUNTER — Other Ambulatory Visit: Payer: Self-pay | Admitting: Internal Medicine

## 2021-06-08 DIAGNOSIS — M48062 Spinal stenosis, lumbar region with neurogenic claudication: Secondary | ICD-10-CM

## 2021-06-08 DIAGNOSIS — S335XXA Sprain of ligaments of lumbar spine, initial encounter: Secondary | ICD-10-CM

## 2021-07-05 DIAGNOSIS — M5136 Other intervertebral disc degeneration, lumbar region: Secondary | ICD-10-CM | POA: Diagnosis not present

## 2021-07-05 DIAGNOSIS — M62838 Other muscle spasm: Secondary | ICD-10-CM | POA: Diagnosis not present

## 2021-07-05 DIAGNOSIS — Z79899 Other long term (current) drug therapy: Secondary | ICD-10-CM | POA: Diagnosis not present

## 2021-07-05 DIAGNOSIS — M5416 Radiculopathy, lumbar region: Secondary | ICD-10-CM | POA: Diagnosis not present

## 2021-07-05 DIAGNOSIS — G894 Chronic pain syndrome: Secondary | ICD-10-CM | POA: Diagnosis not present

## 2021-07-05 DIAGNOSIS — M545 Low back pain, unspecified: Secondary | ICD-10-CM | POA: Diagnosis not present

## 2021-07-05 DIAGNOSIS — Z79891 Long term (current) use of opiate analgesic: Secondary | ICD-10-CM | POA: Diagnosis not present

## 2021-08-04 ENCOUNTER — Other Ambulatory Visit: Payer: Self-pay | Admitting: Internal Medicine

## 2021-08-16 DIAGNOSIS — G894 Chronic pain syndrome: Secondary | ICD-10-CM | POA: Diagnosis not present

## 2021-08-16 DIAGNOSIS — M62838 Other muscle spasm: Secondary | ICD-10-CM | POA: Diagnosis not present

## 2021-08-16 DIAGNOSIS — M5416 Radiculopathy, lumbar region: Secondary | ICD-10-CM | POA: Diagnosis not present

## 2021-09-13 DIAGNOSIS — M62838 Other muscle spasm: Secondary | ICD-10-CM | POA: Diagnosis not present

## 2021-09-13 DIAGNOSIS — M5416 Radiculopathy, lumbar region: Secondary | ICD-10-CM | POA: Diagnosis not present

## 2021-09-13 DIAGNOSIS — M5136 Other intervertebral disc degeneration, lumbar region: Secondary | ICD-10-CM | POA: Diagnosis not present

## 2021-09-13 DIAGNOSIS — G894 Chronic pain syndrome: Secondary | ICD-10-CM | POA: Diagnosis not present

## 2021-09-28 ENCOUNTER — Ambulatory Visit: Payer: Medicare HMO | Admitting: Internal Medicine

## 2021-10-17 ENCOUNTER — Ambulatory Visit (INDEPENDENT_AMBULATORY_CARE_PROVIDER_SITE_OTHER): Payer: Medicare HMO | Admitting: *Deleted

## 2021-10-17 DIAGNOSIS — Z0000 Encounter for general adult medical examination without abnormal findings: Secondary | ICD-10-CM | POA: Diagnosis not present

## 2021-10-17 NOTE — Patient Instructions (Signed)
[name] , Thank you for taking time to come for your Medicare Wellness Visit. I appreciate your ongoing commitment to your health goals. Please review the following plan we discussed and let me know if I can assist you in the future.   Screening recommendations/referrals:  Recommended yearly ophthalmology/optometry visit for glaucoma screening and checkup Recommended yearly dental visit for hygiene and checkup  Vaccinations: Influenza vaccine: declined  Pneumococcal vaccine: completed Tdap vaccine: declined Shingles vaccine: declined     Conditions/risks identified: hypertension  Next appointment: 1 year   Preventive Care 65 Years and Older, Female Preventive care refers to lifestyle choices and visits with your health care provider that can promote health and wellness. What does preventive care include? A yearly physical exam. This is also called an annual well check. Dental exams once or twice a year. Routine eye exams. Ask your health care provider how often you should have your eyes checked. Personal lifestyle choices, including: Daily care of your teeth and gums. Regular physical activity. Eating a healthy diet. Avoiding tobacco and drug use. Limiting alcohol use. Practicing safe sex. Taking low-dose aspirin every day. Taking vitamin and mineral supplements as recommended by your health care provider. What happens during an annual well check? The services and screenings done by your health care provider during your annual well check will depend on your age, overall health, lifestyle risk factors, and family history of disease. Counseling  Your health care provider may ask you questions about your: Alcohol use. Tobacco use. Drug use. Emotional well-being. Home and relationship well-being. Sexual activity. Eating habits. History of falls. Memory and ability to understand (cognition). Work and work Statistician. Reproductive health. Screening  You may have the  following tests or measurements: Height, weight, and BMI. Blood pressure. Lipid and cholesterol levels. These may be checked every 5 years, or more frequently if you are over 67 years old. Skin check. Lung cancer screening. You may have this screening every year starting at age 30 if you have a 30-pack-year history of smoking and currently smoke or have quit within the past 15 years. Fecal occult blood test (FOBT) of the stool. You may have this test every year starting at age 3. Flexible sigmoidoscopy or colonoscopy. You may have a sigmoidoscopy every 5 years or a colonoscopy every 10 years starting at age 55. Hepatitis C blood test. Hepatitis B blood test. Sexually transmitted disease (STD) testing. Diabetes screening. This is done by checking your blood sugar (glucose) after you have not eaten for a while (fasting). You may have this done every 1-3 years. Bone density scan. This is done to screen for osteoporosis. You may have this done starting at age 35. Mammogram. This may be done every 1-2 years. Talk to your health care provider about how often you should have regular mammograms. Talk with your health care provider about your test results, treatment options, and if necessary, the need for more tests. Vaccines  Your health care provider may recommend certain vaccines, such as: Influenza vaccine. This is recommended every year. Tetanus, diphtheria, and acellular pertussis (Tdap, Td) vaccine. You may need a Td booster every 10 years. Zoster vaccine. You may need this after age 79. Pneumococcal 13-valent conjugate (PCV13) vaccine. One dose is recommended after age 80. Pneumococcal polysaccharide (PPSV23) vaccine. One dose is recommended after age 28. Talk to your health care provider about which screenings and vaccines you need and how often you need them. This information is not intended to replace advice given to you  by your health care provider. Make sure you discuss any questions you  have with your health care provider. Document Released: 04/02/2015 Document Revised: 11/24/2015 Document Reviewed: 01/05/2015 Elsevier Interactive Patient Education  2017 [place] Prevention in the Home Falls can cause injuries. They can happen to people of all ages. There are many things you can do to make your home safe and to help prevent falls. What can I do on the outside of my home? Regularly fix the edges of walkways and driveways and fix any cracks. Remove anything that might make you trip as you walk through a door, such as a raised step or threshold. Trim any bushes or trees on the path to your home. Use bright outdoor lighting. Clear any walking paths of anything that might make someone trip, such as rocks or tools. Regularly check to see if handrails are loose or broken. Make sure that both sides of any steps have handrails. Any raised decks and porches should have guardrails on the edges. Have any leaves, snow, or ice cleared regularly. Use sand or salt on walking paths during winter. Clean up any spills in your garage right away. This includes oil or grease spills. What can I do in the bathroom? Use night lights. Install grab bars by the toilet and in the tub and shower. Do not use towel bars as grab bars. Use non-skid mats or decals in the tub or shower. If you need to sit down in the shower, use a plastic, non-slip stool. Keep the floor dry. Clean up any water that spills on the floor as soon as it happens. Remove soap buildup in the tub or shower regularly. Attach bath mats securely with double-sided non-slip rug tape. Do not have throw rugs and other things on the floor that can make you trip. What can I do in the bedroom? Use night lights. Make sure that you have a light by your bed that is easy to reach. Do not use any sheets or blankets that are too big for your bed. They should not hang down onto the floor. Have a firm chair that has side arms. You can  use this for support while you get dressed. Do not have throw rugs and other things on the floor that can make you trip. What can I do in the kitchen? Clean up any spills right away. Avoid walking on wet floors. Keep items that you use a lot in easy-to-reach places. If you need to reach something above you, use a strong step stool that has a grab bar. Keep electrical cords out of the way. Do not use floor polish or wax that makes floors slippery. If you must use wax, use non-skid floor wax. Do not have throw rugs and other things on the floor that can make you trip. What can I do with my stairs? Do not leave any items on the stairs. Make sure that there are handrails on both sides of the stairs and use them. Fix handrails that are broken or loose. Make sure that handrails are as long as the stairways. Check any carpeting to make sure that it is firmly attached to the stairs. Fix any carpet that is loose or worn. Avoid having throw rugs at the top or bottom of the stairs. If you do have throw rugs, attach them to the floor with carpet tape. Make sure that you have a light switch at the top of the stairs and the bottom of the stairs. If  you do not have them, ask someone to add them for you. What else can I do to help prevent falls? Wear shoes that: Do not have high heels. Have rubber bottoms. Are comfortable and fit you well. Are closed at the toe. Do not wear sandals. If you use a stepladder: Make sure that it is fully opened. Do not climb a closed stepladder. Make sure that both sides of the stepladder are locked into place. Ask someone to hold it for you, if possible. Clearly mark and make sure that you can see: Any grab bars or handrails. First and last steps. Where the edge of each step is. Use tools that help you move around (mobility aids) if they are needed. These include: Canes. Walkers. Scooters. Crutches. Turn on the lights when you go into a dark area. Replace any light  bulbs as soon as they burn out. Set up your furniture so you have a clear path. Avoid moving your furniture around. If any of your floors are uneven, fix them. If there are any pets around you, be aware of where they are. Review your medicines with your doctor. Some medicines can make you feel dizzy. This can increase your chance of falling. Ask your doctor what other things that you can do to help prevent falls. This information is not intended to replace advice given to you by your health care provider. Make sure you discuss any questions you have with your health care provider. Document Released: 12/31/2008 Document Revised: 08/12/2015 Document Reviewed: 04/10/2014 Elsevier Interactive Patient Education  2017 [employer].

## 2021-10-17 NOTE — Progress Notes (Addendum)
Subjective:   Paula Long is a 71 y.o. female who presents for Medicare Annual (Subsequent) preventive examination.  I connected with  Paula Long on 10/17/21 by a audio enabled telemedicine application and verified that I am speaking with the correct person using two identifiers.  Patient Location: Home  Provider Location: Office/Clinic  I discussed the limitations of evaluation and management by telemedicine. The patient expressed understanding and agreed to proceed.  Review of Systems     Paula Long , Thank you for taking time to come for your Medicare Wellness Visit. I appreciate your ongoing commitment to your health goals. Please review the following plan we discussed and let me know if I can assist you in the future.   These are the goals we discussed:  Goals      COPD/Tobacco Use     CARE PLAN ENTRY (see longitudinal plan of care for additional care plan information)  Current Barriers:  Tobacco abuse of 50 years; currently smoking 3 ppd Previous quit attempts, unsuccessful d/t cost of nicotine patches.  Reports motivation to quit smoking includes: her current medical condition and warnings from cardiologist  Pharmacist Clinical Goal(s):  Over the next 120 days, patient will work with PharmD and provider towards tobacco cessation  Interventions: Comprehensive medication review performed, medication list in electronic medical record updated Highly encourage and recommend [place] Quit Line (1-800-QUIT-NOW). Patient will outreach this group for support and see if they will give her patches for free that she can try initially.   Patient Self Care Activities: Over the next 120 days: Patient will work to again try and cut back on cigarettes Contact providers should she need emotional support on quitting   Please see past updates related to this goal by clicking on the "Past Updates" button in the selected goal        Hyperlipidemia - LDL < 100     CARE PLAN  ENTRY (see longitudinal plan of care for additional care plan information)  Current Barriers:  Controlled hyperlipidemia, complicated by hypertension, diabetes, and tobacco use. Current antihyperlipidemic regimen: atorvastatin '10mg'$  daily Previous antihyperlipidemic medications tried none Most recent lipid panel:     Component Value Date/Time   CHOL 110 09/30/2018 0613   TRIG 37 09/30/2018 0613   HDL 49 09/30/2018 0613   CHOLHDL 2.2 09/30/2018 0613   VLDL 7 09/30/2018 0613   LDLCALC 54 09/30/2018 0613   LDLCALC 55 08/27/2018 1035   Pharmacist Clinical Goal(s):  Over the next 30 days, patient will work with PharmD and providers towards optimized antihyperlipidemic therapy  Interventions: Comprehensive medication review performed; medication list updated in electronic medical record.  Inter-disciplinary care team collaboration (see longitudinal plan of care) Continue current therapy.    Patient Self Care Activities:  Patient will focus on medication adherence by pill count. Over the next 30 days patient will notify PharmD of any adverse effects related to medication.  Initial goal documentation      Hypertension - < 140/90     CARE PLAN ENTRY (see longitudinal plan of care for additional care plan information)  Current Barriers:  Uncontrolled hypertension, complicated by COPD/tobacco use, hyperlipidemia, and diabetes. Current antihypertensive regimen:  amlodipine '10mg'$  daily labetolol '200mg'$  twice daily hydralazine '100mg'$  three times daily Previous antihypertensives tried: diltiazem ACEIs/ARBs Last practice recorded BP readings: 144/82    Pharmacist Clinical Goal(s):  Over the next 120 days, patient will work with PharmD and providers to optimize antihypertensive regimen  Interventions: Inter-disciplinary care team collaboration (  see longitudinal plan of care) Comprehensive medication review performed; medication list updated in the electronic medical record.  Reviewed  home BP readings Discussed medication adherence  Patient Self Care Activities:  Patient will continue to check BP daily , document, and provide at future appointments Patient will focus on medication adherence by pill count. Over the next 120 days she will notify PharmD or PCP if BP is consistently above > 140/90 when she is checking at home.  Please see past updates related to this goal by clicking on the "Past Updates" button in the selected goal       Weight (lb) < 200 lb (90.7 kg)     Patient would like to lose about 30lbs         This is a list of the screening recommended for you and due dates:  Health Maintenance  Topic Date Due   Zoster (Shingles) Vaccine (1 of 2) Never done   Tetanus Vaccine  03/20/2018   COVID-19 Vaccine (3 - Moderna risk series) 10/06/2019   Complete foot exam   08/12/2020   Hemoglobin A1C  08/18/2020   Flu Shot  03/19/2099*   Eye exam for diabetics  12/15/2021   Mammogram  04/08/2023   Colon Cancer Screening  06/02/2024   Pneumonia Vaccine  Completed   DEXA scan (bone density measurement)  Completed   Hepatitis C Screening: USPSTF Recommendation to screen - Ages 58-79 yo.  Completed   HPV Vaccine  Aged Out  *Topic was postponed. The date shown is not the original due date.          Objective:    There were no vitals filed for this visit. There is no height or weight on file to calculate BMI.     10/12/2020    2:01 PM 03/09/2020    5:41 PM 08/13/2019    3:02 PM 08/05/2019   11:25 AM 07/18/2019    9:01 PM 07/18/2019    5:32 PM 07/18/2019   12:15 PM  Advanced Directives  Does Patient Have a Medical Advance Directive? No No No No No No No  Would patient like information on creating a medical advance directive? No - Patient declined  Yes (MAU/Ambulatory/Procedural Areas - Information given)  No - Patient declined No - Patient declined     Current Medications (verified) Outpatient Encounter Medications as of 10/17/2021  Medication Sig    albuterol (PROVENTIL) (2.5 MG/3ML) 0.083% nebulizer solution INHALE THE CONTENTS OF 1 VIAL VIA NEBULIZER EVERY 4 HOURS AS NEEDED FOR WHEEZING OR SHORTNESS OF BREATH   albuterol (VENTOLIN HFA) 108 (90 Base) MCG/ACT inhaler INHALE 2 PUFFS EVERY 4 HOURS AS NEEDED FOR WHEEZING OR SHORTNESS OF BREATH   amLODipine (NORVASC) 10 MG tablet Take 1 tablet (10 mg total) by mouth daily.   atorvastatin (LIPITOR) 10 MG tablet Take 1 tablet (10 mg total) by mouth daily.   carbamide peroxide (DEBROX) 6.5 % OTIC solution Place 5 drops into both ears 2 (two) times daily.   cyclobenzaprine (FLEXERIL) 10 MG tablet TAKE 1 TABLET BY MOUTH TWICE DAILY BETWEEN MEALS AS NEEDED FOR MUSCLE SPASMS.   desmopressin (DDAVP) 0.2 MG tablet TAKE 1 TABLET BY MOUTH ONCE AT BEDTIME FOR BLADDER.   DEXILANT 60 MG capsule TAKE 1 CAPSULE BY MOUTH DAILY.   DULoxetine (CYMBALTA) 30 MG capsule Cymbalta 30 mg capsule,delayed release  Take 2 capsules every day by oral route for 30 days.   EASYMAX TEST test strip USE TO TEST ONCE DAILY.   ELIQUIS  5 MG TABS tablet TAKE (1) TABLET BY MOUTH TWICE DAILY.   Fluocinolone Acetonide Body (DERMA-SMOOTHE/FS BODY) 0.01 % OIL Apply to scalp daily a three times a week   furosemide (LASIX) 40 MG tablet TAKE 1 TABLET TWICE DAILY   hydrALAZINE (APRESOLINE) 50 MG tablet Take 2 tablets (100 mg total) by mouth 3 (three) times daily.   labetalol (NORMODYNE) 200 MG tablet Take 1 tablet (200 mg total) by mouth 2 (two) times daily.   Magnesium 400 MG CAPS Take 1 capsule by mouth daily.   meclizine (ANTIVERT) 25 MG tablet Take 1 tablet (25 mg total) by mouth 3 (three) times daily as needed for dizziness.   nitroGLYCERIN (NITROSTAT) 0.4 MG SL tablet Place 1 tablet (0.4 mg total) under the tongue every 5 (five) minutes as needed for chest pain.   nystatin (MYCOSTATIN/NYSTOP) powder Apply topically 2 (two) times daily as needed.   traZODone (DESYREL) 150 MG tablet TAKE (1) TABLET BY MOUTH AT BEDTIME.   No  facility-administered encounter medications on file as of 10/17/2021.    Allergies (verified) Hydrocodone-acetaminophen, Oxycodone-acetaminophen, Sulfa antibiotics, Nystatin, Percocet [oxycodone-acetaminophen], Vesicare [solifenacin], and Vicodin [hydrocodone-acetaminophen]   History: Past Medical History:  Diagnosis Date   Anxiety    Arthritis 12-07-11   arthritis,DDD,spinal stenosis   Chronic back pain    COPD (chronic obstructive pulmonary disease) ([place]) 12-07-11   pt. uses nebulizer as needed and inhaler daily   Fibromyalgia    skin cancer   GERD (gastroesophageal reflux disease)    Hypertension 12-07-11   presently no meds   Normal cardiac stress test 06/2009   Skin cancer    melanoma.  face 2014   TIA (transient ischemic attack) ?   Past Surgical History:  Procedure Laterality Date   ABDOMINAL HYSTERECTOMY     BACK SURGERY  12-07-11   2'10 L5 x2   COLONOSCOPY WITH PROPOFOL N/A 06/03/2014   Procedure: COLONOSCOPY WITH PROPOFOL; IN CECUM AT 0750; TOTAL WITHDRAWAL TIME 16 MINUTES;  Surgeon: Rogene Houston, MD;  Location: [facility];  Service: Endoscopy;  Laterality: N/A;   left ankle surgery     tendonitis   LUMBAR LAMINECTOMY/DECOMPRESSION MICRODISCECTOMY  12/11/2011   Procedure: LUMBAR LAMINECTOMY/DECOMPRESSION MICRODISCECTOMY;  Surgeon: Tobi Bastos, MD;  Location: [facility];  Service: Orthopedics;  Laterality: Left;  Decompressive Lumbar Laminectomy L5-S1 on Left   POLYPECTOMY N/A 06/03/2014   Procedure: POLYPECTOMY;  Surgeon: Rogene Houston, MD;  Location: [facility];  Service: Endoscopy;  Laterality: N/A;   right wrist surgery for pinched nerve     trigger finger surgery  12-07-11   rt. middle trigger finger release   Family History  Problem Relation Age of Onset   Cancer Mother        uterine   Cancer Father        lung   Social History   Socioeconomic History   Marital status: Single    Spouse name: Not on file   Number of children: Not on file   Years of education:  Not on file   Highest education level: Not on file  Occupational History   Not on file  Tobacco Use   Smoking status: Every Day    Packs/day: 1.50    Years: 48.00    Total pack years: 72.00    Types: Cigarettes    Start date: 01/07/1970   Smokeless tobacco: Never   Tobacco comments:    gone down to 1 pack per day   Vaping Use  Vaping Use: Never used  Substance and Sexual Activity   Alcohol use: No    Alcohol/week: 0.0 standard drinks of alcohol   Drug use: No   Sexual activity: Yes    Birth control/protection: Surgical  Other Topics Concern   Not on file  Social History Narrative   Not on file   Social Determinants of Health   Financial Resource Strain: Low Risk  (10/12/2020)   Overall Financial Resource Strain (CARDIA)    Difficulty of Paying Living Expenses: Not hard at all  Food Insecurity: No Food Insecurity (10/12/2020)   Hunger Vital Sign    Worried About Running Out of Food in the Last Year: Never true    [place] in the Last Year: Never true  Transportation Needs: No Transportation Needs (10/12/2020)   PRAPARE - Hydrologist (Medical): No    Lack of Transportation (Non-Medical): No  Physical Activity: Inactive (10/12/2020)   Exercise Vital Sign    Days of Exercise per Week: 0 days    Minutes of Exercise per Session: 0 min  Stress: No Stress Concern Present (10/12/2020)   [place]    Feeling of Stress : Not at all  Social Connections: Socially Isolated (10/12/2020)   Social Connection and Isolation Panel [NHANES]    Frequency of Communication with Friends and Family: More than three times a week    Frequency of Social Gatherings with Friends and Family: More than three times a week    Attends Religious Services: Never    Marine scientist or Organizations: No    Attends Music therapist: Never    Marital Status: Divorced    Tobacco  Counseling Ready to quit: Not Answered Counseling given: Not Answered Tobacco comments: gone down to 1 pack per day    Clinical Intake:                 Diabetic?no         Activities of Daily Living     No data to display           Patient Care Team: Lindell Spar, MD as PCP - General (Internal Medicine) Minus Breeding, MD as PCP - Cardiology (Cardiology) Edythe Clarity, [facility] as Pharmacist (Pharmacist)  Indicate any recent Medical Services you may have received from other than [facility] providers in the past year (date may be approximate).     Assessment:   This is a routine wellness examination for Kazi.  Hearing/Vision screen No results found.  Dietary issues and exercise activities discussed:     Goals Addressed   None   Depression Screen    05/25/2021    2:58 PM 04/07/2021    1:09 PM 02/21/2021    1:19 PM 10/19/2020    1:31 PM 10/19/2020    1:30 PM 10/12/2020    2:02 PM 10/12/2020    2:00 PM  PHQ 2/9 Scores  PHQ - 2 Score 0 0 0 0 0 0 0  PHQ- 9 Score    0       Fall Risk    05/25/2021    2:58 PM 04/07/2021    1:09 PM 02/21/2021    1:19 PM 10/19/2020    1:30 PM 10/12/2020    2:01 PM  Fall Risk   Falls in the past year? 0 0 0 0 0  Number falls in past yr: 0 0 0 0 0  Injury with Fall? 0 0 0 0 0  Risk for fall due to : No Fall Risks No Fall Risks No Fall Risks No Fall Risks No Fall Risks  Follow up Falls evaluation completed Falls evaluation completed Falls evaluation completed Falls evaluation completed Falls evaluation completed    [place]:  Any stairs in or around the home? No  If so, are there any without handrails? No  Home free of loose throw rugs in walkways, pet beds, electrical cords, etc? Yes  Adequate lighting in your home to reduce risk of falls? Yes   ASSISTIVE DEVICES UTILIZED TO PREVENT FALLS:  Life alert? No  Use of a cane, walker or w/c? No  Grab bars in the bathroom? No  Shower chair  or bench in shower? Yes  Elevated toilet seat or a handicapped toilet? Yes     Cognitive Function:    10/12/2020    2:03 PM  MMSE - Mini Mental State Exam  Not completed: Unable to complete        10/12/2020    2:03 PM  6CIT Screen  What Year? 0 points  What month? 0 points  What time? 0 points  Count back from 20 0 points  Months in reverse 0 points  Repeat phrase 2 points  Total Score 2 points    Immunizations Immunization History  Administered Date(s) Administered   Moderna Sars-Covid-2 Vaccination 08/13/2019, 09/08/2019   Pneumococcal Conjugate-13 02/23/2017   Pneumococcal Polysaccharide-23 02/26/2013, 02/25/2018    TDAP status: Due, Education has been provided regarding the importance of this vaccine. Advised may receive this vaccine at local pharmacy or Health Dept. Aware to provide a copy of the vaccination record if obtained from local pharmacy or Health Dept. Verbalized acceptance and understanding.  Flu Vaccine status: Declined, Education has been provided regarding the importance of this vaccine but patient still declined. Advised may receive this vaccine at local pharmacy or Health Dept. Aware to provide a copy of the vaccination record if obtained from local pharmacy or Health Dept. Verbalized acceptance and understanding.  Pneumococcal vaccine status: Up to date  Covid-19 vaccine status: Completed vaccines  Qualifies for Shingles Vaccine? Yes   Zostavax completed No   Shingrix Completed?: No.    Education has been provided regarding the importance of this vaccine. Patient has been advised to call insurance company to determine out of pocket expense if they have not yet received this vaccine. Advised may also receive vaccine at local pharmacy or Health Dept. Verbalized acceptance and understanding.  Screening Tests Health Maintenance  Topic Date Due   Zoster Vaccines- Shingrix (1 of 2) Never done   TETANUS/TDAP  03/20/2018   COVID-19 Vaccine (3 - Moderna  risk series) 10/06/2019   FOOT EXAM  08/12/2020   HEMOGLOBIN A1C  08/18/2020   INFLUENZA VACCINE  03/19/2099 (Originally 10/18/2021)   OPHTHALMOLOGY EXAM  12/15/2021   MAMMOGRAM  04/08/2023   COLONOSCOPY (Pts 45-30yr Insurance coverage will need to be confirmed)  06/02/2024   Pneumonia Vaccine 70 Years old  Completed   DEXA SCAN  Completed   Hepatitis C Screening  Completed   HPV VACCINES  Aged Out    Health Maintenance  Health Maintenance Due  Topic Date Due   Zoster Vaccines- Shingrix (1 of 2) Never done   TETANUS/TDAP  03/20/2018   COVID-19 Vaccine (3 - Moderna risk series) 10/06/2019   FOOT EXAM  08/12/2020   HEMOGLOBIN A1C  08/18/2020    Colorectal  cancer screening: Type of screening: Colonoscopy. Completed 06-03-14. Repeat every 10 years  Mammogram status: Completed 04-07-21. Repeat every year  Bone Density status: Completed 03-06-17. Results reflect: Bone density results: NORMAL. Repeat every 5 years.  Lung Cancer Screening: (Low Dose CT Chest recommended if Age 43-80 years, 30 pack-year currently smoking OR have quit w/in 15years.) does qualify.   Lung Cancer Screening Referral: ordered  Additional Screening:  Hepatitis C Screening: does qualify; Completed 12-25-14  Vision Screening: Recommended annual ophthalmology exams for early detection of glaucoma and other disorders of the eye. Is the patient up to date with their annual eye exam?  No  Who is the provider or what is the name of the office in which the patient attends annual eye exams? [facility] If pt is not established with a provider, would they like to be referred to a provider to establish care? No .   Dental Screening: Recommended annual dental exams for proper oral hygiene  Community Resource Referral / Chronic Care Management: CRR required this visit?  No   CCM required this visit?  No      Plan:     I have personally reviewed and noted the following in the patient's chart:   Medical and  social history Use of alcohol, tobacco or illicit drugs  Current medications and supplements including opioid prescriptions.  Functional ability and status Nutritional status Physical activity Advanced directives List of other physicians Hospitalizations, surgeries, and ER visits in previous 12 months Vitals Screenings to include cognitive, depression, and falls Referrals and appointments  In addition, I have reviewed and discussed with patient certain preventive protocols, quality metrics, and best practice recommendations. A written personalized care plan for preventive services as well as general preventive health recommendations were provided to patient.     Shelda Altes, CMA   10/17/2021   Nurse Notes:  Ms. Duba , Thank you for taking time to come for your Medicare Wellness Visit. I appreciate your ongoing commitment to your health goals. Please review the following plan we discussed and let me know if I can assist you in the future.   These are the goals we discussed:  Goals      COPD/Tobacco Use     CARE PLAN ENTRY (see longitudinal plan of care for additional care plan information)  Current Barriers:  Tobacco abuse of 50 years; currently smoking 3 ppd Previous quit attempts, unsuccessful d/t cost of nicotine patches.  Reports motivation to quit smoking includes: her current medical condition and warnings from cardiologist  Pharmacist Clinical Goal(s):  Over the next 120 days, patient will work with PharmD and provider towards tobacco cessation  Interventions: Comprehensive medication review performed, medication list in electronic medical record updated Highly encourage and recommend [place] Quit Line (1-800-QUIT-NOW). Patient will outreach this group for support and see if they will give her patches for free that she can try initially.   Patient Self Care Activities: Over the next 120 days: Patient will work to again try and cut back on cigarettes Contact  providers should she need emotional support on quitting   Please see past updates related to this goal by clicking on the "Past Updates" button in the selected goal        Hyperlipidemia - LDL < 100     CARE PLAN ENTRY (see longitudinal plan of care for additional care plan information)  Current Barriers:  Controlled hyperlipidemia, complicated by hypertension, diabetes, and tobacco use. Current antihyperlipidemic regimen: atorvastatin '10mg'$  daily  Previous antihyperlipidemic medications tried none Most recent lipid panel:     Component Value Date/Time   CHOL 110 09/30/2018 0613   TRIG 37 09/30/2018 0613   HDL 49 09/30/2018 0613   CHOLHDL 2.2 09/30/2018 0613   VLDL 7 09/30/2018 0613   LDLCALC 54 09/30/2018 0613   LDLCALC 55 08/27/2018 1035   Pharmacist Clinical Goal(s):  Over the next 30 days, patient will work with PharmD and providers towards optimized antihyperlipidemic therapy  Interventions: Comprehensive medication review performed; medication list updated in electronic medical record.  Inter-disciplinary care team collaboration (see longitudinal plan of care) Continue current therapy.    Patient Self Care Activities:  Patient will focus on medication adherence by pill count. Over the next 30 days patient will notify PharmD of any adverse effects related to medication.  Initial goal documentation      Hypertension - < 140/90     CARE PLAN ENTRY (see longitudinal plan of care for additional care plan information)  Current Barriers:  Uncontrolled hypertension, complicated by COPD/tobacco use, hyperlipidemia, and diabetes. Current antihypertensive regimen:  amlodipine '10mg'$  daily labetolol '200mg'$  twice daily hydralazine '100mg'$  three times daily Previous antihypertensives tried: diltiazem ACEIs/ARBs Last practice recorded BP readings: 144/82    Pharmacist Clinical Goal(s):  Over the next 120 days, patient will work with PharmD and providers to optimize  antihypertensive regimen  Interventions: Inter-disciplinary care team collaboration (see longitudinal plan of care) Comprehensive medication review performed; medication list updated in the electronic medical record.  Reviewed home BP readings Discussed medication adherence  Patient Self Care Activities:  Patient will continue to check BP daily , document, and provide at future appointments Patient will focus on medication adherence by pill count. Over the next 120 days she will notify PharmD or PCP if BP is consistently above > 140/90 when she is checking at home.  Please see past updates related to this goal by clicking on the "Past Updates" button in the selected goal       Weight (lb) < 200 lb (90.7 kg)     Patient would like to lose about 30lbs         This is a list of the screening recommended for you and due dates:  Health Maintenance  Topic Date Due   Zoster (Shingles) Vaccine (1 of 2) Never done   Tetanus Vaccine  03/20/2018   COVID-19 Vaccine (3 - Moderna risk series) 10/06/2019   Complete foot exam   08/12/2020   Hemoglobin A1C  08/18/2020   Flu Shot  03/19/2099*   Eye exam for diabetics  12/15/2021   Mammogram  04/08/2023   Colon Cancer Screening  06/02/2024   Pneumonia Vaccine  Completed   DEXA scan (bone density measurement)  Completed   Hepatitis C Screening: USPSTF Recommendation to screen - Ages 51-79 yo.  Completed   HPV Vaccine  Aged Out  *Topic was postponed. The date shown is not the original due date.

## 2021-10-19 ENCOUNTER — Ambulatory Visit (INDEPENDENT_AMBULATORY_CARE_PROVIDER_SITE_OTHER): Payer: Medicare HMO | Admitting: Internal Medicine

## 2021-10-19 ENCOUNTER — Encounter: Payer: Self-pay | Admitting: Internal Medicine

## 2021-10-19 VITALS — BP 118/50 | HR 63 | Resp 16 | Ht 65.0 in | Wt 209.8 lb

## 2021-10-19 DIAGNOSIS — I10 Essential (primary) hypertension: Secondary | ICD-10-CM

## 2021-10-19 DIAGNOSIS — N1832 Chronic kidney disease, stage 3b: Secondary | ICD-10-CM | POA: Diagnosis not present

## 2021-10-19 DIAGNOSIS — G6289 Other specified polyneuropathies: Secondary | ICD-10-CM | POA: Diagnosis not present

## 2021-10-19 DIAGNOSIS — L304 Erythema intertrigo: Secondary | ICD-10-CM | POA: Diagnosis not present

## 2021-10-19 DIAGNOSIS — K219 Gastro-esophageal reflux disease without esophagitis: Secondary | ICD-10-CM | POA: Diagnosis not present

## 2021-10-19 MED ORDER — PREGABALIN 50 MG PO CAPS: 50.0000 mg | ORAL_CAPSULE | Freq: Two times a day (BID) | ORAL | 0 refills | Status: DC

## 2021-10-19 MED ORDER — KETOCONAZOLE 2 % EX CREA: 1.0000 | TOPICAL_CREAM | Freq: Every day | CUTANEOUS | 0 refills | Status: DC

## 2021-10-19 MED ORDER — DEXLANSOPRAZOLE 60 MG PO CPDR: 1.0000 | DELAYED_RELEASE_CAPSULE | Freq: Every day | ORAL | 3 refills | Status: DC

## 2021-10-19 NOTE — Assessment & Plan Note (Signed)
BP Readings from Last 1 Encounters:  10/19/21 (!) 118/50   Well-controlled with Amlodipine 10 mg QD, Hydralazine 100 mg TID, Labetalol 200 mg BID and Lasix 40 mg PRN for leg swelling Counseled for compliance with the medications Advised DASH diet and moderate exercise/walking as tolerated Follows up with Cardiologist

## 2021-10-19 NOTE — Assessment & Plan Note (Signed)
Has tried Cymbalta with no relief Was placed on Lyrica 25 mg twice daily, but did not continue it Started Lyrica 50 mg BID - advised to discuss with pain clinic Does not prefer any Neurology referral as she cannot drive to [facility]

## 2021-10-19 NOTE — Assessment & Plan Note (Signed)
Likely due to h/o uncontrolled HTN Avoid nephrotoxic agents including NSAIDs Followed by nephrology - but has stopped seeing them Check CMP On Lasix as needed for leg swelling

## 2021-10-19 NOTE — Progress Notes (Signed)
Established Patient Office Visit  Subjective:  Patient ID: Paula Long, female    DOB: 26-Feb-1951  Age: 71 y.o. MRN: [id number]  CC:  Chief Complaint  Patient presents with   Rash    Patient has rash under her breast has been there awhile was using cream from [place] but this is not working her neuropathy is worse and she can hardly walk     HPI Paula Long is a 71 y.o. 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  She complains of itching rash between the breasts and underneath the breast.  She has noticed redness as well.  She has tried applying an old cream without much relief, but does not recall the name.  She complains of chronic low back pain and burning pain in her b/l LE.  She has chronic weakness of bilateral LE.  She was on Cymbalta for neuropathy, but it did not work for her.  Of note, she sees pain clinic for lumbar radiculopathy, and takes Dilaudid 4 mg twice daily.  Denies any recent fall or injury.  Denies any saddle anesthesia or stool incontinence.  She complains of acid reflux and requests refill of Dexilant.  Denies any vomiting, dysphagia or odynophagia currently.  CKD stage III: She has stopped seeing nephrology.  She denies any dysuria, hematuria, urinary hesitancy or resistance.    Past Medical History:  Diagnosis Date   Anxiety    Arthritis 12-07-11   arthritis,DDD,spinal stenosis   Chronic back pain    COPD (chronic obstructive pulmonary disease) ([place]) 12-07-11   pt. uses nebulizer as needed and inhaler daily   Fibromyalgia    skin cancer   GERD (gastroesophageal reflux disease)    Hypertension 12-07-11   presently no meds   Normal cardiac stress test 06/2009   Skin cancer    melanoma.  face 2014   TIA  (transient ischemic attack) ?    Past Surgical History:  Procedure Laterality Date   ABDOMINAL HYSTERECTOMY     BACK SURGERY  12-07-11   2'10 L5 x2   COLONOSCOPY WITH PROPOFOL N/A 06/03/2014   Procedure: COLONOSCOPY WITH PROPOFOL; IN CECUM AT 0750; TOTAL WITHDRAWAL TIME 16 MINUTES;  Surgeon: Rogene Houston, MD;  Location: [facility];  Service: Endoscopy;  Laterality: N/A;   left ankle surgery     tendonitis   LUMBAR LAMINECTOMY/DECOMPRESSION MICRODISCECTOMY  12/11/2011   Procedure: LUMBAR LAMINECTOMY/DECOMPRESSION MICRODISCECTOMY;  Surgeon: Tobi Bastos, MD;  Location: [facility];  Service: Orthopedics;  Laterality: Left;  Decompressive Lumbar Laminectomy L5-S1 on Left   POLYPECTOMY N/A 06/03/2014   Procedure: POLYPECTOMY;  Surgeon: Rogene Houston, MD;  Location: [facility];  Service: Endoscopy;  Laterality: N/A;   right wrist surgery for pinched nerve     trigger finger surgery  12-07-11   rt. middle trigger finger release    Family History  Problem Relation Age of Onset   Cancer Mother        uterine   Cancer Father        lung    Social History   Socioeconomic History   Marital status: Single    Spouse name: Not on file   Number of children: Not on file   Years of education: Not on file   Highest education  level: Not on file  Occupational History   Not on file  Tobacco Use   Smoking status: Every Day    Packs/day: 1.50    Years: 48.00    Total pack years: 72.00    Types: Cigarettes    Start date: 01/07/1970   Smokeless tobacco: Never   Tobacco comments:    gone down to 1 pack per day   Vaping Use   Vaping Use: Never used  Substance and Sexual Activity   Alcohol use: No    Alcohol/week: 0.0 standard drinks of alcohol   Drug use: No   Sexual activity: Yes    Birth control/protection: Surgical  Other Topics Concern   Not on file  Social History Narrative   Not on file   Social Determinants of Health   Financial Resource Strain: Low Risk  (10/17/2021)   Overall  Financial Resource Strain (CARDIA)    Difficulty of Paying Living Expenses: Not hard at all  Food Insecurity: No Food Insecurity (10/17/2021)   Hunger Vital Sign    Worried About Running Out of Food in the Last Year: Never true    Ran Out of Food in the Last Year: Never true  Transportation Needs: No Transportation Needs (10/17/2021)   PRAPARE - Hydrologist (Medical): No    Lack of Transportation (Non-Medical): No  Physical Activity: Inactive (10/17/2021)   Exercise Vital Sign    Days of Exercise per Week: 0 days    Minutes of Exercise per Session: 0 min  Stress: No Stress Concern Present (10/17/2021)   [place]    Feeling of Stress : Not at all  Social Connections: Moderately Isolated (10/17/2021)   Social Connection and Isolation Panel [NHANES]    Frequency of Communication with Friends and Family: More than three times a week    Frequency of Social Gatherings with Friends and Family: More than three times a week    Attends Religious Services: Never    Marine scientist or Organizations: No    Attends Archivist Meetings: Never    Marital Status: Living with partner  Intimate Partner Violence: Not At Risk (10/17/2021)   Humiliation, Afraid, Rape, and Kick questionnaire    Fear of Current or Ex-Partner: No    Emotionally Abused: No    Physically Abused: No    Sexually Abused: No    Outpatient Medications Prior to Visit  Medication Sig Dispense Refill   albuterol (PROVENTIL) (2.5 MG/3ML) 0.083% nebulizer solution INHALE THE CONTENTS OF 1 VIAL VIA NEBULIZER EVERY 4 HOURS AS NEEDED FOR WHEEZING OR SHORTNESS OF BREATH 90 mL 1   albuterol (VENTOLIN HFA) 108 (90 Base) MCG/ACT inhaler INHALE 2 PUFFS EVERY 4 HOURS AS NEEDED FOR WHEEZING OR SHORTNESS OF BREATH 18 g 2   amLODipine (NORVASC) 10 MG tablet Take 1 tablet (10 mg total) by mouth daily. 90 tablet 3   atorvastatin (LIPITOR)  10 MG tablet Take 1 tablet (10 mg total) by mouth daily. 90 tablet 3   carbamide peroxide (DEBROX) 6.5 % OTIC solution Place 5 drops into both ears 2 (two) times daily. 15 mL 0   cyclobenzaprine (FLEXERIL) 10 MG tablet TAKE 1 TABLET BY MOUTH TWICE DAILY BETWEEN MEALS AS NEEDED FOR MUSCLE SPASMS. 30 tablet 0   desmopressin (DDAVP) 0.2 MG tablet TAKE 1 TABLET BY MOUTH ONCE AT BEDTIME FOR BLADDER. 90 tablet 3   EASYMAX TEST test strip USE TO  TEST ONCE DAILY. 100 each 3   ELIQUIS 5 MG TABS tablet TAKE (1) TABLET BY MOUTH TWICE DAILY. 60 tablet 11   Fluocinolone Acetonide Body (DERMA-SMOOTHE/FS BODY) 0.01 % OIL Apply to scalp daily a three times a week 120 mL 1   hydrALAZINE (APRESOLINE) 50 MG tablet Take 2 tablets (100 mg total) by mouth 3 (three) times daily. 540 tablet 3   HYDROmorphone (DILAUDID) 4 MG tablet Take by mouth.     labetalol (NORMODYNE) 200 MG tablet Take 1 tablet (200 mg total) by mouth 2 (two) times daily. 180 tablet 1   Magnesium 400 MG CAPS Take 1 capsule by mouth daily. 90 capsule 1   meclizine (ANTIVERT) 25 MG tablet Take 1 tablet (25 mg total) by mouth 3 (three) times daily as needed for dizziness. 30 tablet 0   nystatin (MYCOSTATIN/NYSTOP) powder Apply topically 2 (two) times daily as needed. 60 g 2   traZODone (DESYREL) 150 MG tablet TAKE (1) TABLET BY MOUTH AT BEDTIME. 90 tablet 0   DEXILANT 60 MG capsule TAKE 1 CAPSULE BY MOUTH DAILY. 90 capsule 3   DULoxetine (CYMBALTA) 30 MG capsule Cymbalta 30 mg capsule,delayed release  Take 2 capsules every day by oral route for 30 days.     furosemide (LASIX) 40 MG tablet TAKE 1 TABLET TWICE DAILY 180 tablet 1   nitroGLYCERIN (NITROSTAT) 0.4 MG SL tablet Place 1 tablet (0.4 mg total) under the tongue every 5 (five) minutes as needed for chest pain. 25 tablet 3   No facility-administered medications prior to visit.    Allergies  Allergen Reactions   Hydrocodone-Acetaminophen    Oxycodone-Acetaminophen    Sulfa Antibiotics     Nystatin Rash    Oral rash   Percocet [Oxycodone-Acetaminophen] Rash    Oral rash   Vesicare [Solifenacin] Rash   Vicodin [Hydrocodone-Acetaminophen] Rash    Oral rash    ROS Review of Systems  Constitutional:  Negative for chills and fever.  HENT:  Negative for congestion, sinus pressure, sinus pain and sore throat.   Eyes:  Negative for pain and discharge.  Respiratory:  Negative for cough and shortness of breath.   Cardiovascular:  Negative for chest pain and palpitations.  Gastrointestinal:  Negative for abdominal pain, diarrhea, nausea and vomiting.  Endocrine: Negative for polydipsia and polyuria.  Genitourinary:  Negative for dysuria and hematuria.  Musculoskeletal:  Positive for arthralgias and back pain. Negative for neck pain and neck stiffness.  Skin:  Positive for rash.  Neurological:  Positive for weakness. Negative for dizziness, syncope and headaches.  Psychiatric/Behavioral:  Positive for sleep disturbance. Negative for agitation and behavioral problems. The patient is not nervous/anxious.       Objective:    Physical Exam Vitals reviewed.  Constitutional:      General: She is not in acute distress.    Appearance: She is not diaphoretic.  HENT:     Head: Normocephalic and atraumatic.     Nose: Nose normal.     Mouth/Throat:     Mouth: Mucous membranes are moist.  Eyes:     General: No scleral icterus.    Extraocular Movements: Extraocular movements intact.  Cardiovascular:     Rate and Rhythm: Normal rate and regular rhythm.     Pulses: Normal pulses.     Heart sounds: Murmur (Systolic, right upper sternal border) heard.  Pulmonary:     Breath sounds: Normal breath sounds. No wheezing or rales.  Abdominal:     Palpations: Abdomen  is soft.     Tenderness: There is no abdominal tenderness.  Musculoskeletal:        General: Tenderness (Lumbar spinal area) present.     Cervical back: Neck supple. No tenderness.     Right lower leg: No edema.     Left  lower leg: No edema.  Skin:    General: Skin is warm.     Findings: Rash (Whitish patches on erythematous base -current underneath breasts) present.  Neurological:     General: No focal deficit present.     Mental Status: She is alert and oriented to person, place, and time.     Sensory: No sensory deficit.     Motor: Weakness (3/5 in b/l LE, 4/5 in b/l UE) present.     Gait: Gait abnormal (Slow, unsteady).  Psychiatric:        Mood and Affect: Mood normal.        Behavior: Behavior normal.     BP (!) 118/50 (BP Location: Right Arm, Patient Position: Sitting, Cuff Size: Normal)   Pulse 63   Resp 16   Ht '5\' 5"'  (1.651 m)   Wt 209 lb 12.8 oz (95.2 kg)   SpO2 95%   BMI 34.91 kg/m  Wt Readings from Last 3 Encounters:  10/19/21 209 lb 12.8 oz (95.2 kg)  05/25/21 212 lb (96.2 kg)  04/07/21 209 lb 0.6 oz (94.8 kg)    Lab Results  Component Value Date   TSH 1.571 09/30/2018   Lab Results  Component Value Date   WBC 7.2 12/15/2020   HGB 12.7 12/15/2020   HCT 38.3 12/15/2020   MCV 92 12/15/2020   PLT 260 12/15/2020   Lab Results  Component Value Date   NA 136 12/15/2020   K 5.0 12/15/2020   CO2 23 12/15/2020   GLUCOSE 97 12/15/2020   BUN 21 12/15/2020   CREATININE 1.59 (H) 12/15/2020   BILITOT 0.4 03/09/2020   ALKPHOS 59 03/09/2020   AST 19 03/09/2020   ALT 14 03/09/2020   PROT 7.2 03/09/2020   ALBUMIN 4.0 03/09/2020   CALCIUM 9.3 12/15/2020   ANIONGAP 12 03/09/2020   EGFR 35 (L) 12/15/2020   Lab Results  Component Value Date   CHOL 123 12/15/2020   Lab Results  Component Value Date   HDL 70 12/15/2020   Lab Results  Component Value Date   LDLCALC 44 12/15/2020   Lab Results  Component Value Date   TRIG 33 12/15/2020   Lab Results  Component Value Date   CHOLHDL 1.8 12/15/2020   Lab Results  Component Value Date   HGBA1C 5.4 02/18/2020      Assessment & Plan:   Problem List Items Addressed This Visit       Cardiovascular and Mediastinum    Essential hypertension    BP Readings from Last 1 Encounters:  10/19/21 (!) 118/50  Well-controlled with Amlodipine 10 mg QD, Hydralazine 100 mg TID, Labetalol 200 mg BID and Lasix 40 mg PRN for leg swelling Counseled for compliance with the medications Advised DASH diet and moderate exercise/walking as tolerated Follows up with Cardiologist        Digestive   GERD (gastroesophageal reflux disease)    Refilled Dexilant Has tried pantoprazole, Nexium and omeprazole      Relevant Medications   dexlansoprazole (DEXILANT) 60 MG capsule     Nervous and Auditory   Peripheral neuropathy - Primary    Has tried Cymbalta with no relief Was placed on  Lyrica 25 mg twice daily, but did not continue it Started Lyrica 50 mg BID - advised to discuss with pain clinic Does not prefer any Neurology referral as she cannot drive to [facility]      Relevant Medications   pregabalin (LYRICA) 50 MG capsule     Genitourinary   CKD (chronic kidney disease) stage 3, GFR 30-59 ml/min (HCC)    Likely due to h/o uncontrolled HTN Avoid nephrotoxic agents including NSAIDs Followed by nephrology - but has stopped seeing them Check CMP On Lasix as needed for leg swelling      Other Visit Diagnoses     Intertrigo       Relevant Medications   ketoconazole (NIZORAL) 2 % cream       Meds ordered this encounter  Medications   pregabalin (LYRICA) 50 MG capsule    Sig: Take 1 capsule (50 mg total) by mouth 2 (two) times daily.    Dispense:  60 capsule    Refill:  0   dexlansoprazole (DEXILANT) 60 MG capsule    Sig: Take 1 capsule (60 mg total) by mouth daily.    Dispense:  90 capsule    Refill:  3   ketoconazole (NIZORAL) 2 % cream    Sig: Apply 1 Application topically daily.    Dispense:  15 g    Refill:  0    Follow-up: Return if symptoms worsen or fail to improve.    Lindell Spar, MD

## 2021-10-19 NOTE — Assessment & Plan Note (Addendum)
Refilled Dexilant Has tried pantoprazole, Nexium and omeprazole

## 2021-10-19 NOTE — Patient Instructions (Addendum)
Please start taking Lyrica as prescribed for neuropathic pain. Please let your pain clinic know about Lyrica and ask if they can refill it further.  Please continue to take other medications as prescribed.  Please bring the medications in the next visit.

## 2021-10-25 DIAGNOSIS — Z79899 Other long term (current) drug therapy: Secondary | ICD-10-CM | POA: Diagnosis not present

## 2021-10-25 DIAGNOSIS — M5136 Other intervertebral disc degeneration, lumbar region: Secondary | ICD-10-CM | POA: Diagnosis not present

## 2021-10-25 DIAGNOSIS — M62838 Other muscle spasm: Secondary | ICD-10-CM | POA: Diagnosis not present

## 2021-10-25 DIAGNOSIS — M5416 Radiculopathy, lumbar region: Secondary | ICD-10-CM | POA: Diagnosis not present

## 2021-10-25 DIAGNOSIS — Z79891 Long term (current) use of opiate analgesic: Secondary | ICD-10-CM | POA: Diagnosis not present

## 2021-10-25 DIAGNOSIS — G894 Chronic pain syndrome: Secondary | ICD-10-CM | POA: Diagnosis not present

## 2021-10-26 ENCOUNTER — Other Ambulatory Visit: Payer: Self-pay | Admitting: Internal Medicine

## 2021-10-26 DIAGNOSIS — K219 Gastro-esophageal reflux disease without esophagitis: Secondary | ICD-10-CM

## 2021-10-26 MED ORDER — PANTOPRAZOLE SODIUM 40 MG PO TBEC: 40.0000 mg | DELAYED_RELEASE_TABLET | Freq: Every day | ORAL | 3 refills | Status: DC

## 2021-11-08 ENCOUNTER — Other Ambulatory Visit: Payer: Self-pay | Admitting: Internal Medicine

## 2021-11-08 ENCOUNTER — Telehealth: Payer: Self-pay | Admitting: Internal Medicine

## 2021-11-08 DIAGNOSIS — F5104 Psychophysiologic insomnia: Secondary | ICD-10-CM

## 2021-11-08 NOTE — Telephone Encounter (Signed)
Patient needs refill on   traZODone (DESYREL) 150 MG tablet

## 2021-11-28 ENCOUNTER — Ambulatory Visit: Payer: Medicare HMO | Admitting: Internal Medicine

## 2021-12-06 DIAGNOSIS — L84 Corns and callosities: Secondary | ICD-10-CM | POA: Diagnosis not present

## 2021-12-06 DIAGNOSIS — M79676 Pain in unspecified toe(s): Secondary | ICD-10-CM | POA: Diagnosis not present

## 2021-12-06 DIAGNOSIS — B351 Tinea unguium: Secondary | ICD-10-CM | POA: Diagnosis not present

## 2021-12-06 DIAGNOSIS — I70203 Unspecified atherosclerosis of native arteries of extremities, bilateral legs: Secondary | ICD-10-CM | POA: Diagnosis not present

## 2021-12-14 ENCOUNTER — Ambulatory Visit: Payer: Medicare HMO | Admitting: Internal Medicine

## 2021-12-15 ENCOUNTER — Encounter: Payer: Self-pay | Admitting: Internal Medicine

## 2021-12-21 ENCOUNTER — Ambulatory Visit (INDEPENDENT_AMBULATORY_CARE_PROVIDER_SITE_OTHER): Payer: Medicare HMO | Admitting: Internal Medicine

## 2021-12-21 ENCOUNTER — Encounter: Payer: Self-pay | Admitting: Internal Medicine

## 2021-12-21 VITALS — BP 138/64 | HR 70 | Resp 18 | Ht 66.0 in | Wt 209.0 lb

## 2021-12-21 DIAGNOSIS — J418 Mixed simple and mucopurulent chronic bronchitis: Secondary | ICD-10-CM

## 2021-12-21 DIAGNOSIS — R7303 Prediabetes: Secondary | ICD-10-CM | POA: Diagnosis not present

## 2021-12-21 DIAGNOSIS — I10 Essential (primary) hypertension: Secondary | ICD-10-CM | POA: Diagnosis not present

## 2021-12-21 DIAGNOSIS — M48062 Spinal stenosis, lumbar region with neurogenic claudication: Secondary | ICD-10-CM | POA: Diagnosis not present

## 2021-12-21 DIAGNOSIS — G894 Chronic pain syndrome: Secondary | ICD-10-CM

## 2021-12-21 DIAGNOSIS — I4892 Unspecified atrial flutter: Secondary | ICD-10-CM

## 2021-12-21 DIAGNOSIS — N1832 Chronic kidney disease, stage 3b: Secondary | ICD-10-CM | POA: Diagnosis not present

## 2021-12-21 DIAGNOSIS — E782 Mixed hyperlipidemia: Secondary | ICD-10-CM

## 2021-12-21 DIAGNOSIS — Z2821 Immunization not carried out because of patient refusal: Secondary | ICD-10-CM | POA: Diagnosis not present

## 2021-12-21 DIAGNOSIS — Z720 Tobacco use: Secondary | ICD-10-CM

## 2021-12-21 DIAGNOSIS — G6289 Other specified polyneuropathies: Secondary | ICD-10-CM | POA: Diagnosis not present

## 2021-12-21 DIAGNOSIS — E559 Vitamin D deficiency, unspecified: Secondary | ICD-10-CM

## 2021-12-21 MED ORDER — ALBUTEROL SULFATE HFA 108 (90 BASE) MCG/ACT IN AERS: 2.0000 | INHALATION_SPRAY | Freq: Four times a day (QID) | RESPIRATORY_TRACT | 2 refills | Status: DC | PRN

## 2021-12-21 MED ORDER — PREGABALIN 50 MG PO CAPS: 50.0000 mg | ORAL_CAPSULE | Freq: Two times a day (BID) | ORAL | 3 refills | Status: DC

## 2021-12-21 NOTE — Patient Instructions (Signed)
Please continue taking medications as prescribed.  Please continue to follow low salt diet and ambulate as tolerated.  Please try to cut down to quit smoking.  Please get fasting blood tests done within a week. Please bring your home medications for review.

## 2021-12-21 NOTE — Assessment & Plan Note (Signed)
Smokes about 2 pack/day  Asked about quitting: confirms that he/she currently smokes cigarettes Advise to quit smoking: Educated about QUITTING to reduce the risk of cancer, cardio and cerebrovascular disease. Assess willingness: Unwilling to quit at this time, but is working on cutting back. Assist with counseling and pharmacotherapy: Counseled for 5 minutes and literature provided. Arrange for follow up: follow up in 3 months and continue to offer help.

## 2021-12-21 NOTE — Assessment & Plan Note (Signed)
Has tried Cymbalta with no relief Was placed on Lyrica 25 mg twice daily, but did not continue it Started Lyrica 50 mg BID, she needs to take it regularly Does not prefer any Neurology referral as she cannot drive to [facility]

## 2021-12-21 NOTE — Assessment & Plan Note (Signed)
Currently in sinus rhythm On labetalol for rate control and HTN On Eliquis

## 2021-12-21 NOTE — Assessment & Plan Note (Signed)
Well-controlled with Albuterol PRN

## 2021-12-21 NOTE — Assessment & Plan Note (Signed)
On statin.

## 2021-12-21 NOTE — Progress Notes (Signed)
Established Patient Office Visit  Subjective:  Patient ID: Paula Long, female    DOB: 1950-03-22  Age: 71 y.o. MRN: [id number]  CC:  Chief Complaint  Patient presents with   Follow-up    Follow up HTN and HLD     HPI Paula Long is a 71 y.o. 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  HTN: BP is well-controlled. Takes medications regularly. Patient denies headache, dizziness, chest pain, dyspnea or palpitations.   A Flutter: Denies dyspnea or palpitations. Takes Eliquis regularly, although mentions having cold sensation while she takes it.  CKD stage III: She has stopped seeing nephrology.  She denies any dysuria, hematuria, urinary hesitancy or resistance.  She complains of chronic low back pain and burning pain in her b/l LE.  She has chronic weakness of bilateral LE.  She was on Cymbalta for neuropathy, but it did not work for her.  Of note, she used to go to pain clinic for lumbar radiculopathy, and take Dilaudid 4 mg twice daily, but states that it did not help her pain and has stopped taking it.  Denies any recent fall or injury.  Denies any saddle anesthesia or stool incontinence. She was given Lyrica in the last visit, but has not been taking it.    Past Medical History:  Diagnosis Date   Anxiety    Arthritis 12-07-11   arthritis,DDD,spinal stenosis   Chronic back pain    COPD (chronic obstructive pulmonary disease) ([place]) 12-07-11   pt. uses nebulizer as needed and inhaler daily   Fibromyalgia    skin cancer   GERD (gastroesophageal reflux disease)    Hypertension 12-07-11   presently no meds   Normal cardiac stress test 06/2009   Skin cancer    melanoma.  face 2014   TIA (transient ischemic attack) ?    Past Surgical  History:  Procedure Laterality Date   ABDOMINAL HYSTERECTOMY     BACK SURGERY  12-07-11   2'10 L5 x2   COLONOSCOPY WITH PROPOFOL N/A 06/03/2014   Procedure: COLONOSCOPY WITH PROPOFOL; IN CECUM AT 0750; TOTAL WITHDRAWAL TIME 16 MINUTES;  Surgeon: Rogene Houston, MD;  Location: [facility];  Service: Endoscopy;  Laterality: N/A;   left ankle surgery     tendonitis   LUMBAR LAMINECTOMY/DECOMPRESSION MICRODISCECTOMY  12/11/2011   Procedure: LUMBAR LAMINECTOMY/DECOMPRESSION MICRODISCECTOMY;  Surgeon: Tobi Bastos, MD;  Location: [facility];  Service: Orthopedics;  Laterality: Left;  Decompressive Lumbar Laminectomy L5-S1 on Left   POLYPECTOMY N/A 06/03/2014   Procedure: POLYPECTOMY;  Surgeon: Rogene Houston, MD;  Location: [facility];  Service: Endoscopy;  Laterality: N/A;   right wrist surgery for pinched nerve     trigger finger surgery  12-07-11   rt. middle trigger finger release    Family History  Problem Relation Age of Onset   Cancer Mother        uterine   Cancer Father        lung    Social History   Socioeconomic History   Marital status: Single    Spouse name: Not on file   Number of children: Not on file   Years of education: Not on file   Highest education level: Not on file  Occupational History   Not on file  Tobacco Use   Smoking status: Every Day    Packs/day: 1.50    Years: 48.00    Total pack years: 72.00    Types: Cigarettes    Start date: 01/07/1970   Smokeless tobacco: Never   Tobacco comments:    gone down to 1 pack per day   Vaping Use   Vaping Use: Never used  Substance and Sexual Activity   Alcohol use: No    Alcohol/week: 0.0 standard drinks of alcohol   Drug use: No   Sexual activity: Yes    Birth control/protection: Surgical  Other Topics Concern   Not on file  Social History Narrative   Not on file   Social Determinants of Health   Financial Resource Strain: Low Risk  (10/17/2021)   Overall Financial Resource Strain (CARDIA)    Difficulty of  Paying Living Expenses: Not hard at all  Food Insecurity: No Food Insecurity (10/17/2021)   Hunger Vital Sign    Worried About Running Out of Food in the Last Year: Never true    Ran Out of Food in the Last Year: Never true  Transportation Needs: No Transportation Needs (10/17/2021)   PRAPARE - Hydrologist (Medical): No    Lack of Transportation (Non-Medical): No  Physical Activity: Inactive (10/17/2021)   Exercise Vital Sign    Days of Exercise per Week: 0 days    Minutes of Exercise per Session: 0 min  Stress: No Stress Concern Present (10/17/2021)   [place]    Feeling of Stress : Not at all  Social Connections: Moderately Isolated (10/17/2021)   Social Connection and Isolation Panel [NHANES]    Frequency of Communication with Friends and Family: More than three times a week    Frequency of Social Gatherings with Friends and Family: More than three times a week    Attends Religious Services: Never    Marine scientist or Organizations: No    Attends Archivist Meetings: Never    Marital Status: Living with partner  Intimate Partner Violence: Not At Risk (10/17/2021)   Humiliation, Afraid, Rape, and Kick questionnaire    Fear of Current or Ex-Partner: No    Emotionally Abused: No    Physically Abused: No    Sexually Abused: No    Outpatient Medications Prior to Visit  Medication Sig Dispense Refill   albuterol (PROVENTIL) (2.5 MG/3ML) 0.083% nebulizer solution INHALE THE CONTENTS OF 1 VIAL VIA NEBULIZER EVERY 4 HOURS AS NEEDED FOR WHEEZING OR SHORTNESS OF BREATH 90 mL 1   amLODipine (NORVASC) 10 MG tablet Take 1 tablet (10 mg total) by mouth daily. 90 tablet 3   atorvastatin (LIPITOR) 10 MG tablet Take 1 tablet (10 mg total) by mouth daily. 90 tablet 3   cyclobenzaprine (FLEXERIL) 10 MG tablet TAKE 1 TABLET BY MOUTH TWICE DAILY BETWEEN MEALS AS NEEDED FOR MUSCLE SPASMS. 30  tablet 0   desmopressin (DDAVP) 0.2 MG tablet TAKE 1 TABLET BY MOUTH ONCE AT BEDTIME FOR BLADDER. 90 tablet 3   EASYMAX TEST test strip USE TO TEST ONCE DAILY. 100 each 3   ELIQUIS 5 MG TABS tablet TAKE (1) TABLET BY MOUTH TWICE DAILY. 60 tablet 11   Fluocinolone Acetonide Body (DERMA-SMOOTHE/FS BODY) 0.01 % OIL Apply to scalp daily a three times a week 120 mL 1   hydrALAZINE (APRESOLINE) 50 MG tablet Take 2 tablets (100 mg total) by mouth 3 (three)  times daily. 540 tablet 3   HYDROmorphone (DILAUDID) 4 MG tablet Take by mouth.     ketoconazole (NIZORAL) 2 % cream Apply 1 Application topically daily. 15 g 0   labetalol (NORMODYNE) 200 MG tablet Take 1 tablet (200 mg total) by mouth 2 (two) times daily. 180 tablet 1   Magnesium 400 MG CAPS Take 1 capsule by mouth daily. 90 capsule 1   meclizine (ANTIVERT) 25 MG tablet Take 1 tablet (25 mg total) by mouth 3 (three) times daily as needed for dizziness. 30 tablet 0   nystatin (MYCOSTATIN/NYSTOP) powder Apply topically 2 (two) times daily as needed. 60 g 2   pantoprazole (PROTONIX) 40 MG tablet Take 1 tablet (40 mg total) by mouth daily. 90 tablet 3   traZODone (DESYREL) 150 MG tablet TAKE (1) TABLET BY MOUTH AT BEDTIME. 90 tablet 0   albuterol (VENTOLIN HFA) 108 (90 Base) MCG/ACT inhaler INHALE 2 PUFFS EVERY 4 HOURS AS NEEDED FOR WHEEZING OR SHORTNESS OF BREATH 18 g 2   carbamide peroxide (DEBROX) 6.5 % OTIC solution Place 5 drops into both ears 2 (two) times daily. 15 mL 0   pregabalin (LYRICA) 50 MG capsule Take 1 capsule (50 mg total) by mouth 2 (two) times daily. 60 capsule 0   nitroGLYCERIN (NITROSTAT) 0.4 MG SL tablet Place 1 tablet (0.4 mg total) under the tongue every 5 (five) minutes as needed for chest pain. 25 tablet 3   No facility-administered medications prior to visit.    Allergies  Allergen Reactions   Hydrocodone-Acetaminophen    Oxycodone-Acetaminophen    Sulfa Antibiotics    Nystatin Rash    Oral rash   Percocet  [Oxycodone-Acetaminophen] Rash    Oral rash   Vesicare [Solifenacin] Rash   Vicodin [Hydrocodone-Acetaminophen] Rash    Oral rash    ROS Review of Systems  Constitutional:  Negative for chills and fever.  HENT:  Negative for congestion, sinus pressure, sinus pain and sore throat.   Eyes:  Negative for pain and discharge.  Respiratory:  Negative for cough and shortness of breath.   Cardiovascular:  Negative for chest pain and palpitations.  Gastrointestinal:  Negative for abdominal pain, diarrhea, nausea and vomiting.  Endocrine: Negative for polydipsia and polyuria.  Genitourinary:  Negative for dysuria and hematuria.  Musculoskeletal:  Positive for arthralgias and back pain. Negative for neck pain and neck stiffness.  Skin:  Negative for wound.  Neurological:  Positive for weakness. Negative for dizziness, syncope and headaches.  Psychiatric/Behavioral:  Positive for sleep disturbance. Negative for agitation and behavioral problems. The patient is not nervous/anxious.       Objective:    Physical Exam Vitals reviewed.  Constitutional:      General: She is not in acute distress.    Appearance: She is not diaphoretic.  HENT:     Head: Normocephalic and atraumatic.     Nose: Nose normal.     Mouth/Throat:     Mouth: Mucous membranes are moist.  Eyes:     General: No scleral icterus.    Extraocular Movements: Extraocular movements intact.  Cardiovascular:     Rate and Rhythm: Normal rate and regular rhythm.     Pulses: Normal pulses.     Heart sounds: Murmur (Systolic, right upper sternal border) heard.  Pulmonary:     Breath sounds: Normal breath sounds. No wheezing or rales.  Abdominal:     Palpations: Abdomen is soft.     Tenderness: There is no abdominal tenderness.  Musculoskeletal:        General: Tenderness (Lumbar spinal area) present.     Cervical back: Neck supple. No tenderness.     Right lower leg: No edema.     Left lower leg: No edema.  Skin:     General: Skin is warm and dry.  Neurological:     General: No focal deficit present.     Mental Status: She is alert and oriented to person, place, and time.     Sensory: No sensory deficit.     Motor: Weakness (3/5 in b/l LE, 4/5 in b/l UE) present.     Gait: Gait abnormal (Slow, unsteady).  Psychiatric:        Mood and Affect: Mood normal.        Behavior: Behavior normal.     BP 138/64 (BP Location: Right Arm, Patient Position: Sitting, Cuff Size: Normal)   Pulse 70   Resp 18   Ht 5' 6" (1.676 m)   Wt 209 lb (94.8 kg)   SpO2 96%   BMI 33.73 kg/m  Wt Readings from Last 3 Encounters:  12/21/21 209 lb (94.8 kg)  10/19/21 209 lb 12.8 oz (95.2 kg)  05/25/21 212 lb (96.2 kg)    Lab Results  Component Value Date   TSH 1.571 09/30/2018   Lab Results  Component Value Date   WBC 7.2 12/15/2020   HGB 12.7 12/15/2020   HCT 38.3 12/15/2020   MCV 92 12/15/2020   PLT 260 12/15/2020   Lab Results  Component Value Date   NA 136 12/15/2020   K 5.0 12/15/2020   CO2 23 12/15/2020   GLUCOSE 97 12/15/2020   BUN 21 12/15/2020   CREATININE 1.59 (H) 12/15/2020   BILITOT 0.4 03/09/2020   ALKPHOS 59 03/09/2020   AST 19 03/09/2020   ALT 14 03/09/2020   PROT 7.2 03/09/2020   ALBUMIN 4.0 03/09/2020   CALCIUM 9.3 12/15/2020   ANIONGAP 12 03/09/2020   EGFR 35 (L) 12/15/2020   Lab Results  Component Value Date   CHOL 123 12/15/2020   Lab Results  Component Value Date   HDL 70 12/15/2020   Lab Results  Component Value Date   LDLCALC 44 12/15/2020   Lab Results  Component Value Date   TRIG 33 12/15/2020   Lab Results  Component Value Date   CHOLHDL 1.8 12/15/2020   Lab Results  Component Value Date   HGBA1C 5.4 02/18/2020      Assessment & Plan:   Problem List Items Addressed This Visit       Cardiovascular and Mediastinum   Essential hypertension - Primary    BP Readings from Last 1 Encounters:  12/21/21 138/64  Well-controlled with Amlodipine 10 mg QD,  Hydralazine 100 mg TID, Labetalol 200 mg BID and Lasix 40 mg PRN for leg swelling Counseled for compliance with the medications Advised DASH diet and moderate exercise/walking as tolerated Follows up with Cardiologist      Relevant Orders   CMP14+EGFR   CBC with Differential/Platelet   Paroxysmal atrial flutter (HCC)    Currently in sinus rhythm On labetalol for rate control and HTN On Eliquis      Relevant Orders   TSH   CMP14+EGFR     Respiratory   COPD (chronic obstructive pulmonary disease) ([place])    Well-controlled with Albuterol PRN      Relevant Medications   albuterol (VENTOLIN HFA) 108 (90 Base) MCG/ACT inhaler     Nervous and Auditory  Peripheral neuropathy    Has tried Cymbalta with no relief Was placed on Lyrica 25 mg twice daily, but did not continue it Started Lyrica 50 mg BID, she needs to take it regularly Does not prefer any Neurology referral as she cannot drive to [facility]      Relevant Medications   pregabalin (LYRICA) 50 MG capsule   Other Relevant Orders   TSH     Genitourinary   CKD (chronic kidney disease) stage 3, GFR 30-59 ml/min (HCC)    Likely due to h/o uncontrolled HTN Avoid nephrotoxic agents including NSAIDs Followed by nephrology - but has stopped seeing them Check CMP On Lasix as needed for leg swelling      Relevant Orders   CMP14+EGFR     Other   Tobacco abuse    Smokes about 2 pack/day  Asked about quitting: confirms that he/she currently smokes cigarettes Advise to quit smoking: Educated about QUITTING to reduce the risk of cancer, cardio and cerebrovascular disease. Assess willingness: Unwilling to quit at this time, but is working on cutting back. Assist with counseling and pharmacotherapy: Counseled for 5 minutes and literature provided. Arrange for follow up: follow up in 3 months and continue to offer help.      Spinal stenosis, lumbar region, with neurogenic claudication    S/p lumbar spine surgeries Has  chronic low back pain, used to see [facility] and later [facility] pain clinic Has stopped opioid medications On Lyrica now      Relevant Medications   pregabalin (LYRICA) 50 MG capsule   Hyperlipidemia    On statin      Chronic pain syndrome    Was on chronic opioid treatment Referred to pain clinic in [place], but has stopped seeing them, does not take opioids now On Lyrica      Relevant Medications   pregabalin (LYRICA) 50 MG capsule   Other Relevant Orders   TSH   Refused influenza vaccine   Other Visit Diagnoses     Prediabetes       Relevant Orders   Hemoglobin A1c   Vitamin D deficiency       Relevant Orders   VITAMIN D 25 Hydroxy (Vit-D Deficiency, Fractures)       Meds ordered this encounter  Medications   albuterol (VENTOLIN HFA) 108 (90 Base) MCG/ACT inhaler    Sig: Inhale 2 puffs into the lungs every 6 (six) hours as needed for wheezing or shortness of breath.    Dispense:  18 g    Refill:  2   pregabalin (LYRICA) 50 MG capsule    Sig: Take 1 capsule (50 mg total) by mouth 2 (two) times daily.    Dispense:  60 capsule    Refill:  3    Follow-up: No follow-ups on file.    Lindell Spar, MD

## 2021-12-21 NOTE — Assessment & Plan Note (Signed)
Was on chronic opioid treatment Referred to pain clinic in [place], but has stopped seeing them, does not take opioids now On Lyrica

## 2021-12-21 NOTE — Assessment & Plan Note (Signed)
BP Readings from Last 1 Encounters:  12/21/21 138/64   Well-controlled with Amlodipine 10 mg QD, Hydralazine 100 mg TID, Labetalol 200 mg BID and Lasix 40 mg PRN for leg swelling Counseled for compliance with the medications Advised DASH diet and moderate exercise/walking as tolerated Follows up with Cardiologist

## 2021-12-21 NOTE — Assessment & Plan Note (Signed)
S/p lumbar spine surgeries Has chronic low back pain, used to see [facility] and later [facility] pain clinic Has stopped opioid medications On Lyrica now

## 2021-12-21 NOTE — Assessment & Plan Note (Signed)
Likely due to h/o uncontrolled HTN Avoid nephrotoxic agents including NSAIDs Followed by nephrology - but has stopped seeing them Check CMP On Lasix as needed for leg swelling

## 2022-02-01 ENCOUNTER — Other Ambulatory Visit: Payer: Self-pay | Admitting: Internal Medicine

## 2022-02-01 DIAGNOSIS — F5104 Psychophysiologic insomnia: Secondary | ICD-10-CM

## 2022-02-16 ENCOUNTER — Other Ambulatory Visit: Payer: Self-pay | Admitting: Internal Medicine

## 2022-02-16 DIAGNOSIS — N3281 Overactive bladder: Secondary | ICD-10-CM

## 2022-03-12 ENCOUNTER — Emergency Department (HOSPITAL_COMMUNITY): Payer: Medicare HMO

## 2022-03-12 ENCOUNTER — Emergency Department (HOSPITAL_COMMUNITY)
Admission: EM | Admit: 2022-03-12 | Discharge: 2022-03-13 | Disposition: A | Discharge status: Home / Self Care | Payer: Medicare HMO | Attending: Emergency Medicine | Admitting: Emergency Medicine

## 2022-03-12 ENCOUNTER — Other Ambulatory Visit: Payer: Self-pay

## 2022-03-12 ENCOUNTER — Encounter (HOSPITAL_COMMUNITY): Payer: Self-pay

## 2022-03-12 DIAGNOSIS — S80912A Unspecified superficial injury of left knee, initial encounter: Secondary | ICD-10-CM | POA: Diagnosis present

## 2022-03-12 DIAGNOSIS — F17210 Nicotine dependence, cigarettes, uncomplicated: Secondary | ICD-10-CM | POA: Insufficient documentation

## 2022-03-12 DIAGNOSIS — Z85820 Personal history of malignant melanoma of skin: Secondary | ICD-10-CM | POA: Diagnosis not present

## 2022-03-12 DIAGNOSIS — J449 Chronic obstructive pulmonary disease, unspecified: Secondary | ICD-10-CM | POA: Insufficient documentation

## 2022-03-12 DIAGNOSIS — Z7901 Long term (current) use of anticoagulants: Secondary | ICD-10-CM | POA: Insufficient documentation

## 2022-03-12 DIAGNOSIS — I10 Essential (primary) hypertension: Secondary | ICD-10-CM | POA: Insufficient documentation

## 2022-03-12 DIAGNOSIS — W010XXA Fall on same level from slipping, tripping and stumbling without subsequent striking against object, initial encounter: Secondary | ICD-10-CM | POA: Insufficient documentation

## 2022-03-12 DIAGNOSIS — R58 Hemorrhage, not elsewhere classified: Secondary | ICD-10-CM | POA: Diagnosis not present

## 2022-03-12 DIAGNOSIS — W19XXXA Unspecified fall, initial encounter: Secondary | ICD-10-CM

## 2022-03-12 DIAGNOSIS — S81012A Laceration without foreign body, left knee, initial encounter: Secondary | ICD-10-CM | POA: Diagnosis not present

## 2022-03-12 MED ORDER — LIDOCAINE HCL (PF) 1 % IJ SOLN
5.0000 mL | Freq: Once | INTRAMUSCULAR | Status: AC
  Administered 2022-03-13: 5 mL
  Filled 2022-03-12: qty 5

## 2022-03-12 MED ORDER — TETANUS-DIPHTH-ACELL PERTUSSIS 5-2.5-18.5 LF-MCG/0.5 IM SUSY
0.5000 mL | PREFILLED_SYRINGE | Freq: Once | INTRAMUSCULAR | Status: DC
  Filled 2022-03-12: qty 0.5

## 2022-03-12 NOTE — ED Triage Notes (Signed)
Pt presents with fall and tonight and now has a laceration to the left knee. Bleeding controlled. Pt does not know when last tetanus shot was.

## 2022-03-13 MED ORDER — LIDOCAINE HCL (PF) 1 % IJ SOLN
INTRAMUSCULAR | Status: AC
  Filled 2022-03-13: qty 5

## 2022-03-13 MED ORDER — LIDOCAINE HCL (PF) 1 % IJ SOLN
5.0000 mL | Freq: Once | INTRAMUSCULAR | Status: AC
  Administered 2022-03-13: 5 mL via INTRADERMAL

## 2022-03-13 NOTE — ED Provider Notes (Signed)
[facility] Emergency Department Provider Note MRN:  [id number]  Arrival date & time: 03/13/22     Chief Complaint   Fall and Knee Lacteration   History of Present Illness   Paula Long is a 71 y.o. year-old female with a history of COPD, TIA presenting to the ED with chief complaint of fall.  Patient stumbled and tripped and fell onto her left knee.  Sustained a laceration.  Denies any head trauma or loss of consciousness, no chest pain or shortness of breath, no neck or back pain.  Takes blood thinners.  Fall occurred several hours ago.  Review of Systems  A thorough review of systems was obtained and all systems are negative except as noted in the HPI and PMH.   Patient's Health History    Past Medical History:  Diagnosis Date   Anxiety    Arthritis 12-07-11   arthritis,DDD,spinal stenosis   Chronic back pain    COPD (chronic obstructive pulmonary disease) ([place]) 12-07-11   pt. uses nebulizer as needed and inhaler daily   Fibromyalgia    skin cancer   GERD (gastroesophageal reflux disease)    Hypertension 12-07-11   presently no meds   Normal cardiac stress test 06/2009   Skin cancer    melanoma.  face 2014   TIA (transient ischemic attack) ?    Past Surgical History:  Procedure Laterality Date   ABDOMINAL HYSTERECTOMY     BACK SURGERY  12-07-11   2'10 L5 x2   COLONOSCOPY WITH PROPOFOL N/A 06/03/2014   Procedure: COLONOSCOPY WITH PROPOFOL; IN CECUM AT 0750; TOTAL WITHDRAWAL TIME 16 MINUTES;  Surgeon: Rogene Houston, MD;  Location: [facility];  Service: Endoscopy;  Laterality: N/A;   left ankle surgery     tendonitis   LUMBAR LAMINECTOMY/DECOMPRESSION MICRODISCECTOMY  12/11/2011   Procedure: LUMBAR LAMINECTOMY/DECOMPRESSION MICRODISCECTOMY;  Surgeon: Tobi Bastos, MD;  Location: [facility];  Service: Orthopedics;  Laterality: Left;  Decompressive Lumbar Laminectomy L5-S1 on Left   POLYPECTOMY N/A 06/03/2014   Procedure: POLYPECTOMY;  Surgeon:  Rogene Houston, MD;  Location: [facility];  Service: Endoscopy;  Laterality: N/A;   right wrist surgery for pinched nerve     trigger finger surgery  12-07-11   rt. middle trigger finger release    Family History  Problem Relation Age of Onset   Cancer Mother        uterine   Cancer Father        lung    Social History   Socioeconomic History   Marital status: Single    Spouse name: Not on file   Number of children: Not on file   Years of education: Not on file   Highest education level: Not on file  Occupational History   Not on file  Tobacco Use   Smoking status: Every Day    Packs/day: 1.50    Years: 48.00    Total pack years: 72.00    Types: Cigarettes    Start date: 01/07/1970   Smokeless tobacco: Never   Tobacco comments:    gone down to 1 pack per day   Vaping Use   Vaping Use: Never used  Substance and Sexual Activity   Alcohol use: No    Alcohol/week: 0.0 standard drinks of alcohol   Drug use: No   Sexual activity: Yes    Birth control/protection: Surgical  Other Topics Concern   Not on file  Social History Narrative   Not on  file   Social Determinants of Health   Financial Resource Strain: Low Risk  (10/17/2021)   Overall Financial Resource Strain (CARDIA)    Difficulty of Paying Living Expenses: Not hard at all  Food Insecurity: No Food Insecurity (10/17/2021)   Hunger Vital Sign    Worried About Running Out of Food in the Last Year: Never true    Ran Out of Food in the Last Year: Never true  Transportation Needs: No Transportation Needs (10/17/2021)   PRAPARE - Hydrologist (Medical): No    Lack of Transportation (Non-Medical): No  Physical Activity: Inactive (10/17/2021)   Exercise Vital Sign    Days of Exercise per Week: 0 days    Minutes of Exercise per Session: 0 min  Stress: No Stress Concern Present (10/17/2021)   [place]    Feeling of Stress : Not  at all  Social Connections: Moderately Isolated (10/17/2021)   Social Connection and Isolation Panel [NHANES]    Frequency of Communication with Friends and Family: More than three times a week    Frequency of Social Gatherings with Friends and Family: More than three times a week    Attends Religious Services: Never    Marine scientist or Organizations: No    Attends Archivist Meetings: Never    Marital Status: Living with partner  Intimate Partner Violence: Not At Risk (10/17/2021)   Humiliation, Afraid, Rape, and Kick questionnaire    Fear of Current or Ex-Partner: No    Emotionally Abused: No    Physically Abused: No    Sexually Abused: No     Physical Exam   Vitals:   03/12/22 2039 03/13/22 0030  BP: (!) 127/115 (!) 180/63  Pulse: 69 62  Resp: 18 18  Temp: 98.6 F (37 C)   SpO2: 97% 94%    CONSTITUTIONAL: Well-appearing, NAD NEURO/PSYCH:  Alert and oriented x 3, no focal deficits EYES:  eyes equal and reactive ENT/NECK:  no LAD, no JVD CARDIO: Regular rate, well-perfused, normal S1 and S2 PULM:  CTAB no wheezing or rhonchi GI/GU:  non-distended, non-tender MSK/SPINE:  No gross deformities, no edema SKIN: Laceration to the left knee anteriorly   *Additional and/or pertinent findings included in MDM below  Diagnostic and Interventional Summary    EKG Interpretation  Date/Time:    Ventricular Rate:    PR Interval:    QRS Duration:   QT Interval:    QTC Calculation:   R Axis:     Text Interpretation:         Labs Reviewed - No data to display  DG Knee Complete 4 Views Left  Final Result      Medications  Tdap (BOOSTRIX) injection 0.5 mL (0.5 mLs Intramuscular Not Given 03/12/22 2337)  lidocaine (PF) (XYLOCAINE) 1 % injection (has no administration in time range)  lidocaine (PF) (XYLOCAINE) 1 % injection 5 mL (5 mLs Infiltration Given 03/13/22 0050)  lidocaine (PF) (XYLOCAINE) 1 % injection 5 mL (5 mLs Intradermal Given 03/13/22 0050)      Procedures  /  Critical Care .Marland KitchenLaceration Repair  Date/Time: 03/13/2022 1:01 AM  Performed by: Maudie Flakes, MD Authorized by: Maudie Flakes, MD   Consent:    Consent obtained:  Verbal   Consent given by:  Patient   Risks, benefits, and alternatives were discussed: yes     Risks discussed:  Infection, need for additional repair, nerve  damage, poor wound healing, poor cosmetic result, pain, retained foreign body, tendon damage and vascular damage Universal protocol:    Procedure explained and questions answered to patient or proxy's satisfaction: yes     Immediately prior to procedure, a time out was called: yes     Patient identity confirmed:  Verbally with patient Anesthesia:    Anesthesia method:  Local infiltration   Local anesthetic:  Lidocaine 1% w/o epi Laceration details:    Location:  Leg   Leg location:  L knee   Length (cm):  5   Depth (mm):  3 Pre-procedure details:    Preparation:  Patient was prepped and draped in usual sterile fashion and imaging obtained to evaluate for foreign bodies Exploration:    Limited defect created (wound extended): no     Hemostasis achieved with:  Direct pressure   Imaging obtained: x-ray     Imaging outcome: foreign body not noted     Wound exploration: wound explored through full range of motion and entire depth of wound visualized     Contaminated: no   Treatment:    Area cleansed with:  Soap and water   Amount of cleaning:  Extensive Skin repair:    Repair method:  Sutures and staples   Suture size:  3-0   Suture material:  Nylon   Suture technique:  Simple interrupted   Number of sutures:  3   Number of staples:  7 Approximation:    Approximation:  Close Repair type:    Repair type:  Simple Post-procedure details:    Dressing:  Open (no dressing)   Procedure completion:  Tolerated well, no immediate complications   ED Course and Medical Decision Making  Initial Impression and Ddx Differential diagnosis  includes fracture.  Will x-ray.  Laceration will need repair.  Patient is on blood thinners but denies any head trauma, the fall occurred 10+ hours ago, and so no indication for CNS imaging.  Past medical/surgical history that increases complexity of ED encounter: None  Interpretation of Diagnostics I personally reviewed the knee x-ray and my interpretation is as follows: No fracture    Patient Reassessment and Ultimate Disposition/Management     Laceration repaired as described above, appropriate for discharge.  Patient management required discussion with the following services or consulting groups:  None  Complexity of Problems Addressed Acute illness or injury that poses threat of life of bodily function  Additional Data Reviewed and Analyzed Further history obtained from: None  Additional Factors Impacting ED Encounter Risk Minor Procedures  Barth Kirks. Sedonia Small, MD [place] [email or web address]  Final Clinical Impressions(s) / ED Diagnoses     ICD-10-CM   1. Fall, initial encounter  W19.XXXA     2. Laceration of left knee, initial encounter  S81.012A       ED Discharge Orders     None        Discharge Instructions Discussed with and Provided to Patient:     Discharge Instructions      You were evaluated in the Emergency Department and after careful evaluation, we did not find any emergent condition requiring admission or further testing in the hospital.  Your exam/testing today is overall reassuring.  We repaired your laceration here in the emergency department.  Your sutures and staples will need to be removed in 10 to 14 days by healthcare professional.  Please return to the Emergency Department if you experience any worsening of your condition.  Thank you for allowing [place] to be a part of your care.       Maudie Flakes, MD 03/13/22 [phone]

## 2022-03-13 NOTE — Discharge Instructions (Signed)
You were evaluated in the Emergency Department and after careful evaluation, we did not find any emergent condition requiring admission or further testing in the hospital.  Your exam/testing today is overall reassuring.  We repaired your laceration here in the emergency department.  Your sutures and staples will need to be removed in 10 to 14 days by healthcare professional.  Please return to the Emergency Department if you experience any worsening of your condition.   Thank you for allowing [place] to be a part of your care.

## 2022-03-15 ENCOUNTER — Encounter (HOSPITAL_COMMUNITY): Payer: Self-pay

## 2022-03-15 ENCOUNTER — Other Ambulatory Visit: Payer: Self-pay

## 2022-03-15 ENCOUNTER — Emergency Department (HOSPITAL_COMMUNITY): Payer: Medicare HMO

## 2022-03-15 ENCOUNTER — Inpatient Hospital Stay (HOSPITAL_COMMUNITY)
Admission: EM | Admit: 2022-03-15 | Discharge: 2022-03-18 | DRG: 178 | Disposition: A | Discharge status: Home Health | Payer: Medicare HMO | Attending: Internal Medicine | Admitting: Internal Medicine

## 2022-03-15 DIAGNOSIS — E869 Volume depletion, unspecified: Secondary | ICD-10-CM | POA: Diagnosis present

## 2022-03-15 DIAGNOSIS — Z888 Allergy status to other drugs, medicaments and biological substances status: Secondary | ICD-10-CM | POA: Diagnosis not present

## 2022-03-15 DIAGNOSIS — K219 Gastro-esophageal reflux disease without esophagitis: Secondary | ICD-10-CM | POA: Diagnosis present

## 2022-03-15 DIAGNOSIS — Z885 Allergy status to narcotic agent status: Secondary | ICD-10-CM | POA: Diagnosis not present

## 2022-03-15 DIAGNOSIS — M797 Fibromyalgia: Secondary | ICD-10-CM | POA: Diagnosis present

## 2022-03-15 DIAGNOSIS — R197 Diarrhea, unspecified: Secondary | ICD-10-CM

## 2022-03-15 DIAGNOSIS — I10 Essential (primary) hypertension: Secondary | ICD-10-CM

## 2022-03-15 DIAGNOSIS — J449 Chronic obstructive pulmonary disease, unspecified: Secondary | ICD-10-CM | POA: Diagnosis present

## 2022-03-15 DIAGNOSIS — I4892 Unspecified atrial flutter: Secondary | ICD-10-CM | POA: Diagnosis present

## 2022-03-15 DIAGNOSIS — Z7901 Long term (current) use of anticoagulants: Secondary | ICD-10-CM

## 2022-03-15 DIAGNOSIS — Z801 Family history of malignant neoplasm of trachea, bronchus and lung: Secondary | ICD-10-CM | POA: Diagnosis not present

## 2022-03-15 DIAGNOSIS — Z8049 Family history of malignant neoplasm of other genital organs: Secondary | ICD-10-CM | POA: Diagnosis not present

## 2022-03-15 DIAGNOSIS — Z6834 Body mass index (BMI) 34.0-34.9, adult: Secondary | ICD-10-CM | POA: Diagnosis not present

## 2022-03-15 DIAGNOSIS — U071 COVID-19: Principal | ICD-10-CM | POA: Diagnosis present

## 2022-03-15 DIAGNOSIS — J42 Unspecified chronic bronchitis: Secondary | ICD-10-CM

## 2022-03-15 DIAGNOSIS — N179 Acute kidney failure, unspecified: Secondary | ICD-10-CM | POA: Diagnosis present

## 2022-03-15 DIAGNOSIS — R262 Difficulty in walking, not elsewhere classified: Secondary | ICD-10-CM

## 2022-03-15 DIAGNOSIS — Z90710 Acquired absence of both cervix and uterus: Secondary | ICD-10-CM | POA: Diagnosis not present

## 2022-03-15 DIAGNOSIS — Z882 Allergy status to sulfonamides status: Secondary | ICD-10-CM | POA: Diagnosis not present

## 2022-03-15 DIAGNOSIS — E669 Obesity, unspecified: Secondary | ICD-10-CM | POA: Diagnosis present

## 2022-03-15 DIAGNOSIS — Z85820 Personal history of malignant melanoma of skin: Secondary | ICD-10-CM

## 2022-03-15 DIAGNOSIS — I129 Hypertensive chronic kidney disease with stage 1 through stage 4 chronic kidney disease, or unspecified chronic kidney disease: Secondary | ICD-10-CM | POA: Diagnosis present

## 2022-03-15 DIAGNOSIS — N1832 Chronic kidney disease, stage 3b: Secondary | ICD-10-CM | POA: Diagnosis present

## 2022-03-15 DIAGNOSIS — I959 Hypotension, unspecified: Secondary | ICD-10-CM | POA: Diagnosis not present

## 2022-03-15 DIAGNOSIS — F17200 Nicotine dependence, unspecified, uncomplicated: Secondary | ICD-10-CM

## 2022-03-15 DIAGNOSIS — R059 Cough, unspecified: Secondary | ICD-10-CM | POA: Diagnosis not present

## 2022-03-15 DIAGNOSIS — F17210 Nicotine dependence, cigarettes, uncomplicated: Secondary | ICD-10-CM | POA: Diagnosis present

## 2022-03-15 DIAGNOSIS — Z720 Tobacco use: Secondary | ICD-10-CM | POA: Diagnosis not present

## 2022-03-15 DIAGNOSIS — R531 Weakness: Secondary | ICD-10-CM | POA: Diagnosis present

## 2022-03-15 DIAGNOSIS — R051 Acute cough: Secondary | ICD-10-CM

## 2022-03-15 DIAGNOSIS — Z85828 Personal history of other malignant neoplasm of skin: Secondary | ICD-10-CM

## 2022-03-15 LAB — BASIC METABOLIC PANEL
Anion gap: 8 (ref 5–15)
BUN: 26 mg/dL — ABNORMAL HIGH (ref 8–23)
CO2: 24 mmol/L (ref 22–32)
Calcium: 8.8 mg/dL — ABNORMAL LOW (ref 8.9–10.3)
Chloride: 105 mmol/L (ref 98–111)
Creatinine, Ser: 1.93 mg/dL — ABNORMAL HIGH (ref 0.44–1.00)
GFR, Estimated: 28 mL/min — ABNORMAL LOW (ref >60)
Glucose, Bld: 105 mg/dL — ABNORMAL HIGH (ref 70–99)
Potassium: 4.5 mmol/L (ref 3.5–5.1)
Sodium: 137 mmol/L (ref 135–145)

## 2022-03-15 LAB — RESP PANEL BY RT-PCR (RSV, FLU A&B, COVID)  RVPGX2
Influenza A by PCR: NEGATIVE
Influenza B by PCR: NEGATIVE
Resp Syncytial Virus by PCR: NEGATIVE
SARS Coronavirus 2 by RT PCR: POSITIVE — AB

## 2022-03-15 MED ORDER — AMLODIPINE BESYLATE 5 MG PO TABS
10.0000 mg | ORAL_TABLET | Freq: Every day | ORAL | Status: DC
  Administered 2022-03-16 – 2022-03-18 (×3): 10 mg via ORAL
  Filled 2022-03-15 (×3): qty 2

## 2022-03-15 MED ORDER — IBUPROFEN 400 MG PO TABS
600.0000 mg | ORAL_TABLET | Freq: Once | ORAL | Status: AC
  Administered 2022-03-15: 600 mg via ORAL
  Filled 2022-03-15: qty 2

## 2022-03-15 MED ORDER — GUAIFENESIN-DM 100-10 MG/5ML PO SYRP
10.0000 mL | ORAL_SOLUTION | ORAL | Status: DC | PRN
  Administered 2022-03-17: 10 mL via ORAL
  Filled 2022-03-15: qty 10

## 2022-03-15 MED ORDER — ACETAMINOPHEN 325 MG PO TABS: 650.0000 mg | ORAL_TABLET | Freq: Four times a day (QID) | ORAL | Status: DC | PRN

## 2022-03-15 MED ORDER — ACETAMINOPHEN 650 MG RE SUPP: 650.0000 mg | Freq: Four times a day (QID) | RECTAL | Status: DC | PRN

## 2022-03-15 MED ORDER — ONDANSETRON HCL 4 MG/2ML IJ SOLN: 4.0000 mg | Freq: Four times a day (QID) | INTRAMUSCULAR | Status: DC | PRN

## 2022-03-15 MED ORDER — APIXABAN 5 MG PO TABS
5.0000 mg | ORAL_TABLET | Freq: Two times a day (BID) | ORAL | Status: DC
  Administered 2022-03-16 – 2022-03-18 (×6): 5 mg via ORAL
  Filled 2022-03-15 (×6): qty 1

## 2022-03-15 MED ORDER — SODIUM CHLORIDE 0.9 % IV SOLN: Freq: Once | INTRAVENOUS | Status: AC

## 2022-03-15 MED ORDER — ONDANSETRON HCL 4 MG PO TABS: 4.0000 mg | ORAL_TABLET | Freq: Four times a day (QID) | ORAL | Status: DC | PRN

## 2022-03-15 MED ORDER — HYDRALAZINE HCL 25 MG PO TABS
100.0000 mg | ORAL_TABLET | Freq: Three times a day (TID) | ORAL | Status: DC
  Administered 2022-03-16 (×3): 100 mg via ORAL
  Filled 2022-03-15 (×5): qty 4

## 2022-03-15 MED ORDER — NIRMATRELVIR/RITONAVIR (PAXLOVID)TABLET: 3.0000 | ORAL_TABLET | Freq: Two times a day (BID) | ORAL | Status: DC

## 2022-03-15 MED ORDER — TRAZODONE HCL 50 MG PO TABS
150.0000 mg | ORAL_TABLET | Freq: Every evening | ORAL | Status: DC | PRN
  Administered 2022-03-16 – 2022-03-17 (×3): 150 mg via ORAL
  Filled 2022-03-15 (×3): qty 3

## 2022-03-15 MED ORDER — DEXAMETHASONE 4 MG PO TABS
6.0000 mg | ORAL_TABLET | Freq: Every day | ORAL | Status: DC
  Administered 2022-03-15 – 2022-03-18 (×4): 6 mg via ORAL
  Filled 2022-03-15 (×4): qty 2

## 2022-03-15 MED ORDER — PANTOPRAZOLE SODIUM 40 MG PO TBEC
40.0000 mg | DELAYED_RELEASE_TABLET | Freq: Every day | ORAL | Status: DC
  Administered 2022-03-16 – 2022-03-18 (×3): 40 mg via ORAL
  Filled 2022-03-15 (×3): qty 1

## 2022-03-15 MED ORDER — IPRATROPIUM-ALBUTEROL 0.5-2.5 (3) MG/3ML IN SOLN
3.0000 mL | Freq: Once | RESPIRATORY_TRACT | Status: AC
  Administered 2022-03-15: 3 mL via RESPIRATORY_TRACT
  Filled 2022-03-15: qty 3

## 2022-03-15 MED ORDER — LABETALOL HCL 200 MG PO TABS
200.0000 mg | ORAL_TABLET | Freq: Two times a day (BID) | ORAL | Status: DC
  Administered 2022-03-16 – 2022-03-18 (×6): 200 mg via ORAL
  Filled 2022-03-15 (×6): qty 1

## 2022-03-15 NOTE — Assessment & Plan Note (Signed)
Multifactorial etiology. Due to left knee injury and covid 19. Will need PT consult to assess ambulatory status.

## 2022-03-15 NOTE — Subjective & Objective (Signed)
CC: coughing HPI: 71 year old white female history of COPD, chronic tobacco abuse, obesity BMI 31, hypertension, history of paroxysmal atrial flutter on Eliquis presents to the ER today with cough thing for 2 days.  Patient was in the ER on March 13, 2022 for fall.  She had a laceration to the left knee that was stapled closed.  She states that she started coughing on the evening of the 26th.  Denies any vomiting.  Has had some chills.  In the ER, temp 100.1 blood pressure 170/68 satting 94% on room air.  Patient tested positive for COVID-19.  Patient does have history of IBS.  She has diarrhea on a daily basis.  Patient states that she cannot get to the bathroom in time and had a episode of fecal incontinence in the ER room.  She states that this is not an unusual occurrence for her.  However patient is weak due to her left knee injury.  ER nurse felt she was unsafe for discharge.  [facility] hospital[facility] contacted for admission.

## 2022-03-15 NOTE — H&P (Signed)
History and Physical    Paula Long MLY:[id number] DOB: 21-Dec-1950 DOA: 03/15/2022  DOS: the patient was seen and examined on 03/15/2022  PCP: Lindell Spar, MD   Patient coming from: Home  I have personally briefly reviewed patient's old medical records in [place]  CC: coughing HPI: 71 year old white female history of COPD, chronic tobacco abuse, obesity BMI 31, hypertension, history of paroxysmal atrial flutter on Eliquis presents to the ER today with cough thing for 2 days.  Patient was in the ER on March 13, 2022 for fall.  She had a laceration to the left knee that was stapled closed.  She states that she started coughing on the evening of the 26th.  Denies any vomiting.  Has had some chills.  In the ER, temp 100.1 blood pressure 170/68 satting 94% on room air.  Patient tested positive for COVID-19.  Patient does have history of IBS.  She has diarrhea on a daily basis.  Patient states that she cannot get to the bathroom in time and had a episode of fecal incontinence in the ER room.  She states that this is not an unusual occurrence for her.  However patient is weak due to her left knee injury.  ER nurse felt she was unsafe for discharge.  [facility] hospital[facility] contacted for admission.   ED Course: covid 19 POSITIVE  Review of Systems:  Review of Systems  Constitutional:  Positive for chills. Negative for fever.  HENT: Negative.    Eyes: Negative.   Respiratory:  Positive for cough and shortness of breath.   Cardiovascular: Negative.   Gastrointestinal:        Chronic diarrhea  Genitourinary: Negative.   Musculoskeletal:  Positive for joint pain.       Left knee pain  Skin: Negative.   Neurological: Negative.   Endo/Heme/Allergies: Negative.   Psychiatric/Behavioral: Negative.    All other systems reviewed and are negative.   Past Medical History:  Diagnosis Date   Anxiety    Arthritis 12-07-11   arthritis,DDD,spinal stenosis   Chronic back  pain    COPD (chronic obstructive pulmonary disease) ([place]) 12-07-11   pt. uses nebulizer as needed and inhaler daily   Fibromyalgia    skin cancer   GERD (gastroesophageal reflux disease)    Hypertension 12-07-11   presently no meds   Normal cardiac stress test 06/2009   Skin cancer    melanoma.  face 2014   TIA (transient ischemic attack) ?    Past Surgical History:  Procedure Laterality Date   ABDOMINAL HYSTERECTOMY     BACK SURGERY  12-07-11   2'10 L5 x2   COLONOSCOPY WITH PROPOFOL N/A 06/03/2014   Procedure: COLONOSCOPY WITH PROPOFOL; IN CECUM AT 0750; TOTAL WITHDRAWAL TIME 16 MINUTES;  Surgeon: Rogene Houston, MD;  Location: [facility];  Service: Endoscopy;  Laterality: N/A;   left ankle surgery     tendonitis   LUMBAR LAMINECTOMY/DECOMPRESSION MICRODISCECTOMY  12/11/2011   Procedure: LUMBAR LAMINECTOMY/DECOMPRESSION MICRODISCECTOMY;  Surgeon: Tobi Bastos, MD;  Location: [facility];  Service: Orthopedics;  Laterality: Left;  Decompressive Lumbar Laminectomy L5-S1 on Left   POLYPECTOMY N/A 06/03/2014   Procedure: POLYPECTOMY;  Surgeon: Rogene Houston, MD;  Location: [facility];  Service: Endoscopy;  Laterality: N/A;   right wrist surgery for pinched nerve     trigger finger surgery  12-07-11   rt. middle trigger finger release     reports that she has been smoking cigarettes. She started  smoking about 52 years ago. She has a 72.00 pack-year smoking history. She has never used smokeless tobacco. She reports that she does not drink alcohol and does not use drugs.  Allergies  Allergen Reactions   Hydrocodone-Acetaminophen    Oxycodone-Acetaminophen    Sulfa Antibiotics    Nystatin Rash    Oral rash   Percocet [Oxycodone-Acetaminophen] Rash    Oral rash   Vesicare [Solifenacin] Rash   Vicodin [Hydrocodone-Acetaminophen] Rash    Oral rash    Family History  Problem Relation Age of Onset   Cancer Mother        uterine   Cancer Father        lung    Prior to Admission  medications   Medication Sig Start Date End Date Taking? Authorizing Provider  albuterol (PROVENTIL) (2.5 MG/3ML) 0.083% nebulizer solution INHALE THE CONTENTS OF 1 VIAL VIA NEBULIZER EVERY 4 HOURS AS NEEDED FOR WHEEZING OR SHORTNESS OF BREATH 09/18/16   [place], Modena Nunnery, MD  albuterol (VENTOLIN HFA) 108 (90 Base) MCG/ACT inhaler Inhale 2 puffs into the lungs every 6 (six) hours as needed for wheezing or shortness of breath. 12/21/21   Lindell Spar, MD  amLODipine (NORVASC) 10 MG tablet Take 1 tablet (10 mg total) by mouth daily. 05/25/21   Lindell Spar, MD  atorvastatin (LIPITOR) 10 MG tablet Take 1 tablet (10 mg total) by mouth daily. 05/25/21   Lindell Spar, MD  cyclobenzaprine (FLEXERIL) 10 MG tablet TAKE 1 TABLET BY MOUTH TWICE DAILY BETWEEN MEALS AS NEEDED FOR MUSCLE SPASMS. 06/08/21   Lindell Spar, MD  desmopressin (DDAVP) 0.2 MG tablet TAKE 1 TABLET BY MOUTH ONCE AT BEDTIME FOR BLADDER. 02/16/22   Lindell Spar, MD  [facility] TEST test strip USE TO TEST ONCE DAILY. 06/03/15   Susy Frizzle, MD  ELIQUIS 5 MG TABS tablet TAKE (1) TABLET BY MOUTH TWICE DAILY. 08/04/21   Lindell Spar, MD  Fluocinolone Acetonide Body (DERMA-SMOOTHE/FS BODY) 0.01 % OIL Apply to scalp daily a three times a week 02/18/20   Alycia Rossetti, MD  hydrALAZINE (APRESOLINE) 50 MG tablet Take 2 tablets (100 mg total) by mouth 3 (three) times daily. 05/12/20   Minus Breeding, MD  HYDROmorphone (DILAUDID) 4 MG tablet Take by mouth. 09/21/21   [provider]  ketoconazole (NIZORAL) 2 % cream Apply 1 Application topically daily. 10/19/21   Lindell Spar, MD  labetalol (NORMODYNE) 200 MG tablet Take 1 tablet (200 mg total) by mouth 2 (two) times daily. 06/14/20   Lindell Spar, MD  Magnesium 400 MG CAPS Take 1 capsule by mouth daily. 06/14/20   Lindell Spar, MD  meclizine (ANTIVERT) 25 MG tablet Take 1 tablet (25 mg total) by mouth 3 (three) times daily as needed for dizziness. 02/21/21   Lindell Spar, MD   nitroGLYCERIN (NITROSTAT) 0.4 MG SL tablet Place 1 tablet (0.4 mg total) under the tongue every 5 (five) minutes as needed for chest pain. 05/12/20 08/10/20  Minus Breeding, MD  nystatin (MYCOSTATIN/NYSTOP) powder Apply topically 2 (two) times daily as needed. 11/27/19   Alycia Rossetti, MD  pantoprazole (PROTONIX) 40 MG tablet Take 1 tablet (40 mg total) by mouth daily. 10/26/21   Lindell Spar, MD  pregabalin (LYRICA) 50 MG capsule Take 1 capsule (50 mg total) by mouth 2 (two) times daily. 12/21/21   Lindell Spar, MD  traZODone (DESYREL) 150 MG tablet TAKE (1) TABLET BY MOUTH AT  BEDTIME. 02/01/22   Lindell Spar, MD    Physical Exam: Vitals:   03/15/22 1747 03/15/22 1750 03/15/22 2329 03/15/22 2330  BP:  (!) 179/68 (!) 152/119 (!) 168/102  Pulse:  76 81 80  Resp:  20 (!) 22 (!) 31  Temp:  100.1 F (37.8 C) 99.9 F (37.7 C)   TempSrc:  Oral Oral   SpO2:  94% 96% 95%  Weight: 88.5 kg     Height: '5\' 6"'$  (1.676 m)       Physical Exam Vitals and nursing note reviewed.  Constitutional:      General: She is not in acute distress.    Appearance: She is obese. She is not ill-appearing or diaphoretic.  HENT:     Head: Normocephalic and atraumatic.     Nose: Nose normal.  Eyes:     General: No scleral icterus. Cardiovascular:     Rate and Rhythm: Normal rate and regular rhythm.     Pulses: Normal pulses.  Pulmonary:     Breath sounds: Examination of the right-middle field reveals wheezing. Examination of the left-middle field reveals wheezing. Examination of the right-lower field reveals wheezing. Examination of the left-lower field reveals wheezing. Wheezing present.  Abdominal:     General: Bowel sounds are normal. There is no distension.     Tenderness: There is no abdominal tenderness. There is no guarding.  Skin:    General: Skin is warm and dry.     Capillary Refill: Capillary refill takes less than 2 seconds.     Comments: Staples on left knee cap. Clean, dry, intact.  Minimal bruising  Neurological:     General: No focal deficit present.     Mental Status: She is alert and oriented to person, place, and time.      Labs on Admission: I have personally reviewed following labs and imaging studies  CBC: No results for input(s): "WBC", "NEUTROABS", "HGB", "HCT", "MCV", "PLT" in the last 168 hours. Basic Metabolic Panel: Recent Labs  Lab 03/15/22 2311  NA 137  K 4.5  CL 105  CO2 24  GLUCOSE 105*  BUN 26*  CREATININE 1.93*  CALCIUM 8.8*   GFR: Estimated Creatinine Clearance: 30.4 mL/min (A) (by C-G formula based on SCr of 1.93 mg/dL (H)). Liver Function Tests: No results for input(s): "AST", "ALT", "ALKPHOS", "BILITOT", "PROT", "ALBUMIN" in the last 168 hours. No results for input(s): "LIPASE", "AMYLASE" in the last 168 hours. No results for input(s): "AMMONIA" in the last 168 hours. Coagulation Profile: No results for input(s): "INR", "PROTIME" in the last 168 hours. Cardiac Enzymes: No results for input(s): "CKTOTAL", "CKMB", "CKMBINDEX", "TROPONINI", "TROPONINIHS" in the last 168 hours. BNP (last 3 results) No results for input(s): "PROBNP" in the last 8760 hours. HbA1C: No results for input(s): "HGBA1C" in the last 72 hours. CBG: No results for input(s): "GLUCAP" in the last 168 hours. Lipid Profile: No results for input(s): "CHOL", "HDL", "LDLCALC", "TRIG", "CHOLHDL", "LDLDIRECT" in the last 72 hours. Thyroid Function Tests: No results for input(s): "TSH", "T4TOTAL", "FREET4", "T3FREE", "THYROIDAB" in the last 72 hours. Anemia Panel: No results for input(s): "VITAMINB12", "FOLATE", "FERRITIN", "TIBC", "IRON", "RETICCTPCT" in the last 72 hours. Urine analysis:    Component Value Date/Time   COLORURINE YELLOW 03/09/2020 1856   APPEARANCEUR CLEAR 03/09/2020 1856   LABSPEC 1.014 03/09/2020 1856   PHURINE 7.0 03/09/2020 1856   GLUCOSEU NEGATIVE 03/09/2020 1856   HGBUR SMALL (A) 03/09/2020 1856   BILIRUBINUR NEGATIVE 03/09/2020 1856  BILIRUBINUR neg 03/11/2012 1538   KETONESUR NEGATIVE 03/09/2020 1856   PROTEINUR NEGATIVE 03/09/2020 1856   UROBILINOGEN 0.2 10/13/2013 1517   NITRITE NEGATIVE 03/09/2020 1856   LEUKOCYTESUR NEGATIVE 03/09/2020 1856    Radiological Exams on Admission: I have personally reviewed images DG Chest Portable 1 View  Result Date: 03/15/2022 CLINICAL DATA:  Cough and weakness.  COVID positive EXAM: PORTABLE CHEST 1 VIEW COMPARISON:  03/09/2020 FINDINGS: Stable cardiomediastinal silhouette. Aortic atherosclerotic calcification. No focal consolidation, pleural effusion, or pneumothorax. No acute osseous abnormality. IMPRESSION: No active disease. Electronically Signed   By: Placido Sou M.D.   On: 03/15/2022 22:56    EKG: My personal interpretation of EKG shows: no EKG  Assessment/Plan Principal Problem:   COVID-19 virus infection Active Problems:   COPD (chronic obstructive pulmonary disease) (HCC)   Difficulty walking   Essential hypertension   Tobacco abuse   Paroxysmal atrial flutter ([place])   Long term (current) use of anticoagulants - on Eliquis for hx of atrial flutter.    Assessment and Plan: * COVID-19 virus infection Observation med/surg bed. Pt unable to walk due to weakness and recent fall onto her left knee. Not hypoxia. Some wheezing. Start decadron. Pt's symptoms started on 03-14-2022. Pt was in the ER on 03-12-2022 for laceration to left knee. Will start Paxlovid per recent [facility] covid-19 treatment guidelines(03-03-2022)  Difficulty walking Multifactorial etiology. Due to left knee injury and covid 19. Will need PT consult to assess ambulatory status.  COPD (chronic obstructive pulmonary disease) (HCC) Slight wheezing on exam. No distress. Pt still smokes every day. Start po decadron. Not on supplemental O2. Duonebs q6h.  Long term (current) use of anticoagulants - on Eliquis for hx of atrial flutter. Continue Eliquis 5 mg bid.  Paroxysmal atrial flutter  (HCC) Stable. In NSR.  Tobacco abuse Pt still smoking. States she is not going to quit smoking.  Essential hypertension Continue home BP meds.   DVT prophylaxis: Eliquis Code Status: Full Code Family Communication: no family at bedside  Disposition Plan: return home  Consults called: none  Admission status: Observation, Med-Surg   Kristopher Oppenheim, DO [facility] 03/15/2022, 11:43 PM

## 2022-03-15 NOTE — ED Provider Notes (Signed)
[facility] EMERGENCY DEPARTMENT Provider Note   CSN: [id number] Arrival date & time: 03/15/22  1701     History  Chief Complaint  Patient presents with   Cough   Weakness    Paula Long is a 71 y.o. female.  Patient is a 71 year old female presenting for generalized weakness and cough x 3 days.  She reports presenting to emergency department on 03/11/2022 after falling sustaining a laceration requiring suture repair to the left knee.  She denies any fevers or chills.  Denies nausea, vomiting, abdominal pain.  Admits to intermittent diarrhea.  No sick contacts.  The history is provided by the patient. No language interpreter was used.  Cough Associated symptoms: no chest pain, no chills, no ear pain, no fever, no rash, no shortness of breath and no sore throat   Weakness Associated symptoms: cough and diarrhea   Associated symptoms: no abdominal pain, no arthralgias, no chest pain, no dysuria, no fever, no seizures, no shortness of breath and no vomiting        Home Medications Prior to Admission medications   Medication Sig Start Date End Date Taking? Authorizing Provider  albuterol (PROVENTIL) (2.5 MG/3ML) 0.083% nebulizer solution INHALE THE CONTENTS OF 1 VIAL VIA NEBULIZER EVERY 4 HOURS AS NEEDED FOR WHEEZING OR SHORTNESS OF BREATH 09/18/16   Hastings, Modena Nunnery, MD  albuterol (VENTOLIN HFA) 108 (90 Base) MCG/ACT inhaler Inhale 2 puffs into the lungs every 6 (six) hours as needed for wheezing or shortness of breath. 12/21/21   Lindell Spar, MD  amLODipine (NORVASC) 10 MG tablet Take 1 tablet (10 mg total) by mouth daily. 05/25/21   Lindell Spar, MD  atorvastatin (LIPITOR) 10 MG tablet Take 1 tablet (10 mg total) by mouth daily. 05/25/21   Lindell Spar, MD  cyclobenzaprine (FLEXERIL) 10 MG tablet TAKE 1 TABLET BY MOUTH TWICE DAILY BETWEEN MEALS AS NEEDED FOR MUSCLE SPASMS. 06/08/21   Lindell Spar, MD  desmopressin (DDAVP) 0.2 MG tablet TAKE 1 TABLET BY MOUTH ONCE AT  BEDTIME FOR BLADDER. 02/16/22   Lindell Spar, MD  [facility] TEST test strip USE TO TEST ONCE DAILY. 06/03/15   Susy Frizzle, MD  ELIQUIS 5 MG TABS tablet TAKE (1) TABLET BY MOUTH TWICE DAILY. 08/04/21   Lindell Spar, MD  Fluocinolone Acetonide Body (DERMA-SMOOTHE/FS BODY) 0.01 % OIL Apply to scalp daily a three times a week 02/18/20   Alycia Rossetti, MD  hydrALAZINE (APRESOLINE) 50 MG tablet Take 2 tablets (100 mg total) by mouth 3 (three) times daily. 05/12/20   Minus Breeding, MD  HYDROmorphone (DILAUDID) 4 MG tablet Take by mouth. 09/21/21   [provider]  ketoconazole (NIZORAL) 2 % cream Apply 1 Application topically daily. 10/19/21   Lindell Spar, MD  labetalol (NORMODYNE) 200 MG tablet Take 1 tablet (200 mg total) by mouth 2 (two) times daily. 06/14/20   Lindell Spar, MD  Magnesium 400 MG CAPS Take 1 capsule by mouth daily. 06/14/20   Lindell Spar, MD  meclizine (ANTIVERT) 25 MG tablet Take 1 tablet (25 mg total) by mouth 3 (three) times daily as needed for dizziness. 02/21/21   Lindell Spar, MD  nitroGLYCERIN (NITROSTAT) 0.4 MG SL tablet Place 1 tablet (0.4 mg total) under the tongue every 5 (five) minutes as needed for chest pain. 05/12/20 08/10/20  Minus Breeding, MD  nystatin (MYCOSTATIN/NYSTOP) powder Apply topically 2 (two) times daily as needed. 11/27/19   Vic Blackbird  F, MD  pantoprazole (PROTONIX) 40 MG tablet Take 1 tablet (40 mg total) by mouth daily. 10/26/21   Lindell Spar, MD  pregabalin (LYRICA) 50 MG capsule Take 1 capsule (50 mg total) by mouth 2 (two) times daily. 12/21/21   Lindell Spar, MD  traZODone (DESYREL) 150 MG tablet TAKE (1) TABLET BY MOUTH AT BEDTIME. 02/01/22   Lindell Spar, MD      Allergies    Hydrocodone-acetaminophen, Oxycodone-acetaminophen, Sulfa antibiotics, Nystatin, Percocet [oxycodone-acetaminophen], Vesicare [solifenacin], and Vicodin [hydrocodone-acetaminophen]    Review of Systems   Review of Systems   Constitutional:  Negative for chills and fever.  HENT:  Negative for ear pain and sore throat.   Eyes:  Negative for pain and visual disturbance.  Respiratory:  Positive for cough. Negative for shortness of breath.   Cardiovascular:  Negative for chest pain and palpitations.  Gastrointestinal:  Positive for diarrhea. Negative for abdominal pain and vomiting.  Genitourinary:  Negative for dysuria and hematuria.  Musculoskeletal:  Negative for arthralgias and back pain.  Skin:  Negative for color change and rash.  Neurological:  Positive for weakness. Negative for seizures and syncope.  All other systems reviewed and are negative.   Physical Exam Updated Vital Signs BP (!) 179/68   Pulse 76   Temp 100.1 F (37.8 C) (Oral)   Resp 20   Ht '5\' 6"'$  (1.676 m)   Wt 88.5 kg   SpO2 94%   BMI 31.47 kg/m  Physical Exam Vitals and nursing note reviewed.  Constitutional:      General: She is not in acute distress.    Appearance: She is well-developed.  HENT:     Head: Normocephalic and atraumatic.  Eyes:     Conjunctiva/sclera: Conjunctivae normal.  Cardiovascular:     Rate and Rhythm: Normal rate and regular rhythm.     Heart sounds: No murmur heard. Pulmonary:     Effort: Pulmonary effort is normal. No respiratory distress.     Breath sounds: Normal breath sounds.  Abdominal:     Palpations: Abdomen is soft.     Tenderness: There is no abdominal tenderness.  Musculoskeletal:        General: No swelling.     Cervical back: Neck supple.  Skin:    General: Skin is warm and dry.     Capillary Refill: Capillary refill takes less than 2 seconds.  Neurological:     Mental Status: She is alert.  Psychiatric:        Mood and Affect: Mood normal.     ED Results / Procedures / Treatments   Labs (all labs ordered are listed, but only abnormal results are displayed) Labs Reviewed  RESP PANEL BY RT-PCR (RSV, FLU A&B, COVID)  [id number] - Abnormal; Notable for the following components:       Result Value   SARS Coronavirus 2 by RT PCR POSITIVE (*)    All other components within normal limits    EKG None  Radiology No results found.  Procedures Procedures    Medications Ordered in ED Medications  ibuprofen (ADVIL) tablet 600 mg (has no administration in time range)  0.9 %  sodium chloride infusion (has no administration in time range)    ED Course/ Medical Decision Making/ A&P                           Medical Decision Making Risk Prescription drug management. Decision regarding hospitalization.  10:38 PM Patient is a 71 year old female presenting for generalized weakness, diarrhea, and cough x 3 days.  Patient is alert and oriented x 3, no acute distress, low-grade temperature with at 100.1 F, with otherwise stable vital signs.  No respiratory distress on physical exam.  Abdomen is soft and nontender.  Patient positive for COVID-19 virus.  IV fluids and Tylenol ordered for symptomatic management.  No leukocytosis or any other concerning signs for sepsis.  When nursing attempted to ambulate patient she was unable to stand due to generalized weakness.  She also was in bed in her own urine and stool.  She was changed and closed.  At this time I think she is unsafe to go home due to inability to take care of herself secondary to generalized weakness from [place].  Patient agreeable to plan.  I spoke admitting provider who accepts the patient.          Final Clinical Impression(s) / ED Diagnoses Final diagnoses:  COVID  Weakness  Acute cough  Diarrhea, unspecified type    Rx / DC Orders ED Discharge Orders     None         Lianne Cure, DO 21/11/55 2238

## 2022-03-15 NOTE — Assessment & Plan Note (Signed)
Pt still smoking. States she is not going to quit smoking.

## 2022-03-15 NOTE — Assessment & Plan Note (Signed)
Stable. In NSR.

## 2022-03-15 NOTE — ED Triage Notes (Signed)
Per EMS patient presents with weakness last night that worsened throughout the night. Patiet nore complains of a cough and shaking.

## 2022-03-15 NOTE — Assessment & Plan Note (Signed)
Continue Eliquis 5 mg b.i.d..

## 2022-03-15 NOTE — Assessment & Plan Note (Signed)
Continue home BP meds.

## 2022-03-15 NOTE — ED Notes (Signed)
Pt has been cleaned up and changed. 3 assist to get pt up and cleaned from chair. Pt is now resting in bed at this time with call light in reach.

## 2022-03-15 NOTE — Assessment & Plan Note (Addendum)
Observation med/surg bed. Pt unable to walk due to weakness and recent fall onto her left knee. Not hypoxia. Some wheezing. Start decadron. Pt's symptoms started on 03-14-2022. Pt was in the ER on 03-12-2022 for laceration to left knee. Will start Paxlovid per recent [facility] covid-19 treatment guidelines(03-03-2022)

## 2022-03-15 NOTE — Assessment & Plan Note (Signed)
Slight wheezing on exam. No distress. Pt still smokes every day. Start po decadron. Not on supplemental O2. Duonebs q6h.

## 2022-03-16 LAB — CBC WITH DIFFERENTIAL/PLATELET
Abs Immature Granulocytes: 0 10*3/uL (ref 0.00–0.07)
Basophils Absolute: 0 10*3/uL (ref 0.0–0.1)
Basophils Relative: 0 %
Eosinophils Absolute: 0.4 10*3/uL (ref 0.0–0.5)
Eosinophils Relative: 5 %
HCT: 39.9 % (ref 36.0–46.0)
Hemoglobin: 12.8 g/dL (ref 12.0–15.0)
Lymphocytes Relative: 12 %
Lymphs Abs: 1 10*3/uL (ref 0.7–4.0)
MCH: 31.2 pg (ref 26.0–34.0)
MCHC: 32.1 g/dL (ref 30.0–36.0)
MCV: 97.3 fL (ref 80.0–100.0)
Monocytes Absolute: 0.6 10*3/uL (ref 0.1–1.0)
Monocytes Relative: 7 %
Neutro Abs: 6.5 10*3/uL (ref 1.7–7.7)
Neutrophils Relative %: 76 %
Platelets: 196 10*3/uL (ref 150–400)
RBC: 4.1 MIL/uL (ref 3.87–5.11)
RDW: 15.2 % (ref 11.5–15.5)
WBC: 8.5 10*3/uL (ref 4.0–10.5)
nRBC: 0 % (ref 0.0–0.2)

## 2022-03-16 LAB — CBC
HCT: 40 % (ref 36.0–46.0)
Hemoglobin: 12.7 g/dL (ref 12.0–15.0)
MCH: 30.9 pg (ref 26.0–34.0)
MCHC: 31.8 g/dL (ref 30.0–36.0)
MCV: 97.3 fL (ref 80.0–100.0)
Platelets: 219 10*3/uL (ref 150–400)
RBC: 4.11 MIL/uL (ref 3.87–5.11)
RDW: 15.4 % (ref 11.5–15.5)
WBC: 7.5 10*3/uL (ref 4.0–10.5)
nRBC: 0 % (ref 0.0–0.2)

## 2022-03-16 LAB — HIV ANTIBODY (ROUTINE TESTING W REFLEX): HIV Screen 4th Generation wRfx: NONREACTIVE

## 2022-03-16 NOTE — Evaluation (Signed)
Physical Therapy Evaluation Patient Details Name: Paula Long MRN: [id number] DOB: [birth year] Today's Date: 03/16/2022  History of Present Illness  71 year old white female history of COPD, chronic tobacco abuse, obesity BMI 31, hypertension, history of paroxysmal atrial flutter on Eliquis presents to the ER today with cough thing for 2 days.  Patient was in the ER on March 13, 2022 for fall.  She had a laceration to the left knee that was stapled closed.  She states that she started coughing on the evening of the 26th.  Denies any vomiting.  Has had some chills.     In the ER, temp 100.1 blood pressure 170/68 satting 94% on room air.     Patient tested positive for COVID-19.     Patient does have history of IBS.  She has diarrhea on a daily basis.     Patient states that she cannot get to the bathroom in time and had a episode of fecal incontinence in the ER room.  She states that this is not an unusual occurrence for her.     However patient is weak due to her left knee injury.  ER nurse felt she was unsafe for discharge.   Clinical Impression  Patient demonstrates fair/good return for moving LLE during bed mobility and transferring to [facility], has to lean on armrest of BSC when not using an AD, safer using RW and able to ambulate in room without loss of balance and limited mostly due to fatigue and mild increase in left knee pain.  Patient tolerated sitting up on [facility] to attempt bowel movement after therapy - nursing staff notified.  Patient will benefit from continued skilled physical therapy in hospital and recommended venue below to increase strength, balance, endurance for safe ADLs and gait.         Recommendations for follow up therapy are one component of a multi-disciplinary discharge planning process, led by the attending physician.  Recommendations may be updated based on patient status, additional functional criteria and insurance authorization.  Follow Up Recommendations Home health  PT      Assistance Recommended at Discharge Set up Supervision/Assistance  Patient can return home with the following  A little help with walking and/or transfers;A little help with bathing/dressing/bathroom;Assistance with cooking/housework;Help with stairs or ramp for entrance    Equipment Recommendations None recommended by PT  Recommendations for Other Services       Functional Status Assessment Patient has had a recent decline in their functional status and demonstrates the ability to make significant improvements in function in a reasonable and predictable amount of time.     Precautions / Restrictions Precautions Precautions: Fall Restrictions Weight Bearing Restrictions: No      Mobility  Bed Mobility Overal bed mobility: Needs Assistance Bed Mobility: Supine to Sit     Supine to sit: Min guard, Min assist     General bed mobility comments: increased time, labored movement    Transfers Overall transfer level: Needs assistance Equipment used: Rolling walker (2 wheels), None Transfers: Sit to/from Stand, Bed to chair/wheelchair/BSC Sit to Stand: Min guard, Min assist   Step pivot transfers: Min guard, Min assist       General transfer comment: increased time, labored movment, has to lean on armrest of chair during transfers, safer using RW    Ambulation/Gait Ambulation/Gait assistance: Min assist, Min guard Gait Distance (Feet): 15 Feet Assistive device: Rolling walker (2 wheels) Gait Pattern/deviations: Decreased step length - right, Decreased step length - left, Decreased  stance time - left, Antalgic Gait velocity: decreased     General Gait Details: slow labored cadence with slight antalgic gait on LLE due to knee discomfort  Stairs            Wheelchair Mobility    Modified Rankin (Stroke Patients Only)       Balance Overall balance assessment: Needs assistance Sitting-balance support: Feet supported, No upper extremity  supported Sitting balance-Leahy Scale: Fair Sitting balance - Comments: fair/good seated at EOB   Standing balance support: During functional activity, No upper extremity supported Standing balance-Leahy Scale: Poor Standing balance comment: fair/poor without AD, fair/good using RW                             Pertinent Vitals/Pain Pain Assessment Pain Assessment: Faces Faces Pain Scale: Hurts a little bit Pain Location: left knee Pain Descriptors / Indicators: Sore Pain Intervention(s): Limited activity within patient's tolerance, Monitored during session, Repositioned    Home Living Family/patient expects to be discharged to:: Private residence Living Arrangements: Spouse/significant other Available Help at Discharge: Family;Available PRN/intermittently Type of Home: House Home Access: Stairs to enter Entrance Stairs-Rails: None Entrance Stairs-Number of Steps: 2   Home Layout: One level Home Equipment: Cane - single Barista (2 wheels)      Prior Function Prior Level of Function : Independent/Modified Independent             Mobility Comments: Community ambulator without AD, drives ADLs Comments: Independent     Hand Dominance   Dominant Hand: Right    Extremity/Trunk Assessment   Upper Extremity Assessment Upper Extremity Assessment: Overall WFL for tasks assessed    Lower Extremity Assessment Lower Extremity Assessment: Generalized weakness;LLE deficits/detail LLE Deficits / Details: grossly 4-/5 mostly due to pain LLE: Unable to fully assess due to pain LLE Sensation: WNL LLE Coordination: WNL    Cervical / Trunk Assessment Cervical / Trunk Assessment: Kyphotic  Communication   Communication: No difficulties  Cognition Arousal/Alertness: Awake/alert Behavior During Therapy: WFL for tasks assessed/performed Overall Cognitive Status: Within Functional Limits for tasks assessed                                           General Comments      Exercises     Assessment/Plan    PT Assessment Patient needs continued PT services  PT Problem List Decreased strength;Decreased activity tolerance;Decreased balance;Decreased mobility;Pain       PT Treatment Interventions DME instruction;Gait training;Stair training;Functional mobility training;Therapeutic activities;Therapeutic exercise;Balance training;Patient/family education    PT Goals (Current goals can be found in the Care Plan section)  Acute Rehab PT Goals Patient Stated Goal: return home with family to assist PT Goal Formulation: With patient Time For Goal Achievement: 03/20/22 Potential to Achieve Goals: Good    Frequency Min 3X/week     Co-evaluation               AM-PAC PT "6 Clicks" Mobility  Outcome Measure Help needed turning from your back to your side while in a flat bed without using bedrails?: A Little Help needed moving from lying on your back to sitting on the side of a flat bed without using bedrails?: A Little Help needed moving to and from a bed to a chair (including a wheelchair)?: A Little Help needed standing up from a  chair using your arms (e.g., wheelchair or bedside chair)?: A Little Help needed to walk in hospital room?: A Little Help needed climbing 3-5 steps with a railing? : A Lot 6 Click Score: 17    End of Session   Activity Tolerance: Patient tolerated treatment well;Patient limited by fatigue;Patient limited by pain Patient left: with call bell/phone within reach;Other (comment) (Patient left seated on [facility]) Nurse Communication: Mobility status PT Visit Diagnosis: Unsteadiness on feet (R26.81);Other abnormalities of gait and mobility (R26.89);Muscle weakness (generalized) (M62.81);Pain Pain - Right/Left: Left Pain - part of body: Knee    Time: 4739-5844 PT Time Calculation (min) (ACUTE ONLY): 30 min   Charges:   PT Evaluation $PT Eval Moderate Complexity: 1 Mod PT  Treatments $Therapeutic Activity: 23-37 mins        3:02 PM, 03/16/22 Lonell Grandchild, MPT Physical Therapist with [facility] [phone] office [phone] mobile phone

## 2022-03-16 NOTE — ED Notes (Signed)
2 loose bowel movements bedside commode.

## 2022-03-16 NOTE — Progress Notes (Addendum)
   Informed by pharmacy the gfr< 30 and eliquis use contraindications for paxlovid use.   Kristopher Oppenheim, DO [facility]

## 2022-03-16 NOTE — Plan of Care (Signed)
  Problem: Acute Rehab PT Goals(only PT should resolve) Goal: Pt Will Go Supine/Side To Sit Outcome: Progressing Flowsheets (Taken 03/16/2022 1505) Pt will go Supine/Side to Sit:  with modified independence  with supervision Goal: Patient Will Transfer Sit To/From Stand Outcome: Progressing Flowsheets (Taken 03/16/2022 1505) Patient will transfer sit to/from stand:  with supervision  with modified independence Goal: Pt Will Transfer Bed To Chair/Chair To Bed Outcome: Progressing Flowsheets (Taken 03/16/2022 1505) Pt will Transfer Bed to Chair/Chair to Bed:  with modified independence  with supervision Goal: Pt Will Ambulate Outcome: Progressing Flowsheets (Taken 03/16/2022 1505) Pt will Ambulate:  50 feet  with supervision  with min guard assist  with rolling walker   3:05 PM, 03/16/22 Lonell Grandchild, MPT Physical Therapist with [facility] [phone] office [phone] mobile phone

## 2022-03-16 NOTE — TOC Initial Note (Signed)
Transition of Care [facility]) - Initial/Assessment Note    Patient Details  Name: Paula Long MRN: [id number] Date of Birth: 1950/10/08  [facility] CM/SW Contact:    Ihor Gully, LCSW Phone Number: 03/16/2022, 3:54 PM  Clinical Narrative:                 PT recommends HHPT. HHPT providers discussed. No preference. Referral accepted by Marjory Lies with [place].   Expected Discharge Plan: [place] Barriers to Discharge: Continued Medical Work up   Patient Goals and CMS Choice Patient states their goals for this hospitalization and ongoing recovery are:: home with [facility]          Expected Discharge Plan and Services                                   HH Arranged: PT [place] Agency: [place] Date [place]: 03/16/22 Time HH Agency Contacted: [place] Representative spoke with at [place]: Marjory Lies  Prior Living Arrangements/Services     Patient language and need for interpreter reviewed:: Yes        Need for Family Participation in Patient Care: Yes (Comment) Care giver support system in place?: Yes (comment)   Criminal Activity/Legal Involvement Pertinent to Current Situation/Hospitalization: No - Comment as needed  Activities of Daily Living Home Assistive Devices/Equipment: Walker (specify type) ADL Screening (condition at time of admission) Patient's cognitive ability adequate to safely complete daily activities?: Yes Is the patient deaf or have difficulty hearing?: No Does the patient have difficulty seeing, even when wearing glasses/contacts?: No Does the patient have difficulty concentrating, remembering, or making decisions?: No Patient able to express need for assistance with ADLs?: Yes Does the patient have difficulty dressing or bathing?: Yes Independently performs ADLs?: No Communication: Independent Dressing (OT): Needs assistance Is this a change from baseline?: Pre-admission baseline Grooming:  Needs assistance Is this a change from baseline?: Pre-admission baseline Feeding: Independent Bathing: Needs assistance Is this a change from baseline?: Pre-admission baseline Toileting: Needs assistance Is this a change from baseline?: Pre-admission baseline In/Out Bed: Needs assistance Is this a change from baseline?: Pre-admission baseline Walks in Home: Needs assistance Is this a change from baseline?: Pre-admission baseline Does the patient have difficulty walking or climbing stairs?: Yes Weakness of Legs: Both Weakness of Arms/Hands: None  Permission Sought/Granted                  Emotional Assessment Appearance:: Appears younger than stated age   Affect (typically observed): Appropriate Orientation: : Oriented to Self, Oriented to Place, Oriented to  Time, Oriented to Situation Alcohol / Substance Use: Not Applicable Psych Involvement: No (comment)  Admission diagnosis:  Weakness [R53.1] Diarrhea, unspecified type [R19.7] COVID [U07.1] Acute cough [R05.1] COVID-19 virus infection [U07.1] Patient Active Problem List   Diagnosis Date Noted   COVID-19 virus infection 03/15/2022   Difficulty walking 03/15/2022   Long term (current) use of anticoagulants - on Eliquis for hx of atrial flutter. 03/15/2022   Morbid obesity ([place]) 05/27/2021   Encounter for general adult medical examination with abnormal findings 05/25/2021   Refused influenza vaccine 03/29/2021   Chronic pain syndrome 02/21/2021   Bilateral carotid artery stenosis 09/12/2020   PVD (peripheral vascular disease) ([place]) 09/12/2020   Syncope 05/11/2020   History of colonic polyps 09/25/2019   Osteopenia 08/13/2019   Peripheral neuropathy 08/13/2019   Paroxysmal atrial flutter (  [place]) 07/18/2019   B12 deficiency 12/13/2018   CKD (chronic kidney disease) stage 3, GFR 30-59 ml/min (HCC)    Hyperlipidemia    TIA (transient ischemic attack) 09/29/2018   Depression with anxiety 05/22/2014   Family hx of  colon cancer 02/19/2014   IBS (irritable bowel syndrome) 01/23/2014   Urinary incontinence, mixed 10/13/2013   Mixed incontinence 10/13/2013   Chronic insomnia 09/12/2013   Neck pain on left side 06/21/2012   Bunion of great toe 06/21/2012   OAB (overactive bladder) 03/15/2012   Spinal stenosis, lumbar region, with neurogenic claudication 12/11/2011   Essential hypertension 10/30/2011   Gastroesophageal reflux disease without esophagitis 10/30/2011   COPD (chronic obstructive pulmonary disease) ([place]) 10/30/2011   Tobacco abuse 10/30/2011   PCP:  Lindell Spar, MD Pharmacy:   [facility], [address] [place] [address] Phone: [phone] Fax: [phone]  [facility] [place], [place] [address] Phone: [phone] Fax: [phone]     Social Determinants of Health (SDOH) Social History: SDOH Screenings   Food Insecurity: No Food Insecurity (10/17/2021)  Housing: Low Risk  (10/17/2021)  Transportation Needs: No Transportation Needs (10/17/2021)  Alcohol Screen: Low Risk  (10/17/2021)  Depression (PHQ2-9): Low Risk  (12/21/2021)  Financial Resource Strain: Low Risk  (10/17/2021)  Physical Activity: Inactive (10/17/2021)  Social Connections: Moderately Isolated (10/17/2021)  Stress: No Stress Concern Present (10/17/2021)  Tobacco Use: High Risk (03/15/2022)   SDOH Interventions:     Readmission Risk Interventions     No data to display

## 2022-03-16 NOTE — ED Notes (Signed)
Lunch tray given. 

## 2022-03-16 NOTE — Progress Notes (Signed)
Same day note   Patient seen and examined at bedside.  Patient was admitted to the hospital for difficulty ambulating weakness cough and congestion.  At the time of my evaluation, patient complains of generalized weakness difficulty ambulating.  Physical examination reveals obese female, not in obvious distress, left knee with staples.  Laboratory data and imaging was reviewed  Assessment and plan    COVID-19 virus infection With generalized weakness and difficulty ambulating.  Unable to receive Paxlovid due to Eliquis and low GFR.  Was given Decadron due to wheezing.    Ambulatory dysfunction. Multifactorial etiology. Due to left knee injury and covid 19.  Consult physical therapy.    COPD (chronic obstructive pulmonary disease) (HCC) Continue DuoNebs and Decadron.   Long term (current) use of anticoagulants - on Eliquis for hx of atrial flutter. Continue Eliquis 5 mg bid.   Paroxysmal atrial flutter (HCC) In normal sinus rhythm at this time.   Tobacco abuse Encouraged quitting.   Essential hypertension Continue amlodipine, labetalol, hydralazine.  Blood pressure seems to be stable.   No Charge  Signed,  Delila Pereyra, MD [facility]

## 2022-03-16 NOTE — ED Notes (Signed)
Breakfast tray given. °

## 2022-03-16 NOTE — Hospital Course (Addendum)
  71 year old white female history of COPD, chronic tobacco abuse, obesity BMI 31, hypertension, history of paroxysmal atrial flutter on Eliquis presented to hospital with cough.  Of note patient recently had a fall and laceration to the left knee that was stapled.  In the ED patient was noted to have fever of 100.1 F but pulse ox was 94% on room air.  He also complained of 1 episode of fecal incontinence and generalized weakness.  In the ED patient tested positive for COVID and was considered for admission to the hospital for further evaluation and treatment.

## 2022-03-17 DIAGNOSIS — R262 Difficulty in walking, not elsewhere classified: Secondary | ICD-10-CM | POA: Diagnosis not present

## 2022-03-17 DIAGNOSIS — R531 Weakness: Secondary | ICD-10-CM | POA: Diagnosis present

## 2022-03-17 DIAGNOSIS — Z85828 Personal history of other malignant neoplasm of skin: Secondary | ICD-10-CM | POA: Diagnosis not present

## 2022-03-17 DIAGNOSIS — Z801 Family history of malignant neoplasm of trachea, bronchus and lung: Secondary | ICD-10-CM | POA: Diagnosis not present

## 2022-03-17 DIAGNOSIS — J42 Unspecified chronic bronchitis: Secondary | ICD-10-CM | POA: Diagnosis not present

## 2022-03-17 DIAGNOSIS — Z888 Allergy status to other drugs, medicaments and biological substances status: Secondary | ICD-10-CM | POA: Diagnosis not present

## 2022-03-17 DIAGNOSIS — U071 COVID-19: Secondary | ICD-10-CM | POA: Diagnosis not present

## 2022-03-17 DIAGNOSIS — Z885 Allergy status to narcotic agent status: Secondary | ICD-10-CM | POA: Diagnosis not present

## 2022-03-17 DIAGNOSIS — E669 Obesity, unspecified: Secondary | ICD-10-CM | POA: Diagnosis present

## 2022-03-17 DIAGNOSIS — I129 Hypertensive chronic kidney disease with stage 1 through stage 4 chronic kidney disease, or unspecified chronic kidney disease: Secondary | ICD-10-CM | POA: Diagnosis present

## 2022-03-17 DIAGNOSIS — Z8049 Family history of malignant neoplasm of other genital organs: Secondary | ICD-10-CM | POA: Diagnosis not present

## 2022-03-17 DIAGNOSIS — Z7901 Long term (current) use of anticoagulants: Secondary | ICD-10-CM | POA: Diagnosis not present

## 2022-03-17 DIAGNOSIS — Z6834 Body mass index (BMI) 34.0-34.9, adult: Secondary | ICD-10-CM | POA: Diagnosis not present

## 2022-03-17 DIAGNOSIS — N1832 Chronic kidney disease, stage 3b: Secondary | ICD-10-CM | POA: Diagnosis present

## 2022-03-17 DIAGNOSIS — M797 Fibromyalgia: Secondary | ICD-10-CM | POA: Diagnosis present

## 2022-03-17 DIAGNOSIS — F17210 Nicotine dependence, cigarettes, uncomplicated: Secondary | ICD-10-CM | POA: Diagnosis present

## 2022-03-17 DIAGNOSIS — I10 Essential (primary) hypertension: Secondary | ICD-10-CM | POA: Diagnosis not present

## 2022-03-17 DIAGNOSIS — Z85820 Personal history of malignant melanoma of skin: Secondary | ICD-10-CM | POA: Diagnosis not present

## 2022-03-17 DIAGNOSIS — K219 Gastro-esophageal reflux disease without esophagitis: Secondary | ICD-10-CM | POA: Diagnosis present

## 2022-03-17 DIAGNOSIS — J449 Chronic obstructive pulmonary disease, unspecified: Secondary | ICD-10-CM | POA: Diagnosis present

## 2022-03-17 DIAGNOSIS — I4892 Unspecified atrial flutter: Secondary | ICD-10-CM | POA: Diagnosis present

## 2022-03-17 DIAGNOSIS — N179 Acute kidney failure, unspecified: Secondary | ICD-10-CM | POA: Diagnosis present

## 2022-03-17 DIAGNOSIS — Z720 Tobacco use: Secondary | ICD-10-CM | POA: Diagnosis not present

## 2022-03-17 DIAGNOSIS — E869 Volume depletion, unspecified: Secondary | ICD-10-CM | POA: Diagnosis present

## 2022-03-17 DIAGNOSIS — Z90710 Acquired absence of both cervix and uterus: Secondary | ICD-10-CM | POA: Diagnosis not present

## 2022-03-17 DIAGNOSIS — Z882 Allergy status to sulfonamides status: Secondary | ICD-10-CM | POA: Diagnosis not present

## 2022-03-17 LAB — BASIC METABOLIC PANEL
Anion gap: 9 (ref 5–15)
BUN: 47 mg/dL — ABNORMAL HIGH (ref 8–23)
CO2: 20 mmol/L — ABNORMAL LOW (ref 22–32)
Calcium: 8.8 mg/dL — ABNORMAL LOW (ref 8.9–10.3)
Chloride: 110 mmol/L (ref 98–111)
Creatinine, Ser: 2.23 mg/dL — ABNORMAL HIGH (ref 0.44–1.00)
GFR, Estimated: 23 mL/min — ABNORMAL LOW (ref >60)
Glucose, Bld: 112 mg/dL — ABNORMAL HIGH (ref 70–99)
Potassium: 4.5 mmol/L (ref 3.5–5.1)
Sodium: 139 mmol/L (ref 135–145)

## 2022-03-17 LAB — CBC
HCT: 39 % (ref 36.0–46.0)
Hemoglobin: 12.6 g/dL (ref 12.0–15.0)
MCH: 31.1 pg (ref 26.0–34.0)
MCHC: 32.3 g/dL (ref 30.0–36.0)
MCV: 96.3 fL (ref 80.0–100.0)
Platelets: 241 10*3/uL (ref 150–400)
RBC: 4.05 MIL/uL (ref 3.87–5.11)
RDW: 15.7 % — ABNORMAL HIGH (ref 11.5–15.5)
WBC: 13 10*3/uL — ABNORMAL HIGH (ref 4.0–10.5)
nRBC: 0 % (ref 0.0–0.2)

## 2022-03-17 LAB — MAGNESIUM: Magnesium: 2.2 mg/dL (ref 1.7–2.4)

## 2022-03-17 MED ORDER — IPRATROPIUM-ALBUTEROL 0.5-2.5 (3) MG/3ML IN SOLN
3.0000 mL | Freq: Four times a day (QID) | RESPIRATORY_TRACT | Status: DC
  Administered 2022-03-17 (×2): 3 mL via RESPIRATORY_TRACT
  Filled 2022-03-17 (×3): qty 3

## 2022-03-17 MED ORDER — SODIUM CHLORIDE 0.9 % IV SOLN: INTRAVENOUS | Status: AC

## 2022-03-17 NOTE — Progress Notes (Signed)
Physical Therapy Treatment Patient Details Name: Paula Long MRN: [id number] DOB: 09-12-1950 Today's Date: 03/17/2022   History of Present Illness 71 year old white female history of COPD, chronic tobacco abuse, obesity BMI 31, hypertension, history of paroxysmal atrial flutter on Eliquis presents to the ER today with cough thing for 2 days.  Patient was in the ER on March 13, 2022 for fall.  She had a laceration to the left knee that was stapled closed.  She states that she started coughing on the evening of the 26th.  Denies any vomiting.  Has had some chills.     In the ER, temp 100.1 blood pressure 170/68 satting 94% on room air.     Patient tested positive for COVID-19.     Patient does have history of IBS.  She has diarrhea on a daily basis.     Patient states that she cannot get to the bathroom in time and had a episode of fecal incontinence in the ER room.  She states that this is not an unusual occurrence for her.     However patient is weak due to her left knee injury.  ER nurse felt she was unsafe for discharge.    PT Comments    Patient demonstrates improvement for bed mobility and transferring to chair, commode in bathroom without requiring use of an AD, ambulated in room holding IV pole without loss of balance, but limited mostly due to c/o SOB and tolerated sitting up in chair after therapy.  Patient will benefit from continued skilled physical therapy in hospital and recommended venue below to increase strength, balance, endurance for safe ADLs and gait.    Recommendations for follow up therapy are one component of a multi-disciplinary discharge planning process, led by the attending physician.  Recommendations may be updated based on patient status, additional functional criteria and insurance authorization.  Follow Up Recommendations  Home health PT     Assistance Recommended at Discharge    Patient can return home with the following A little help with walking and/or  transfers;A little help with bathing/dressing/bathroom;Assistance with cooking/housework;Help with stairs or ramp for entrance   Equipment Recommendations  None recommended by PT    Recommendations for Other Services       Precautions / Restrictions Precautions Precautions: Fall Restrictions Weight Bearing Restrictions: No     Mobility  Bed Mobility Overal bed mobility: Modified Independent             General bed mobility comments: good return for completing supine to sitting and moving LLE    Transfers Overall transfer level: Needs assistance Equipment used: None (using IV pole) Transfers: Sit to/from Stand, Bed to chair/wheelchair/BSC Sit to Stand: Supervision   Step pivot transfers: Supervision       General transfer comment: good return for transferring to/from chair and commode in bathroom without use of an AD    Ambulation/Gait Ambulation/Gait assistance: Supervision Gait Distance (Feet): 25 Feet Assistive device: IV Pole Gait Pattern/deviations: Decreased step length - left, Decreased step length - right, Decreased stride length Gait velocity: decreased     General Gait Details: slightly labored cadence pushing IV pole without loss of balance, limited mostly due to c/o SOB with SpO2 at 93% during activity   Stairs             Wheelchair Mobility    Modified Rankin (Stroke Patients Only)       Balance Overall balance assessment: Needs assistance Sitting-balance support: Feet supported, No upper extremity  supported Sitting balance-Leahy Scale: Good Sitting balance - Comments: seated at EOB   Standing balance support: During functional activity, No upper extremity supported Standing balance-Leahy Scale: Fair Standing balance comment: fair/good without AD and using IV pole                            Cognition Arousal/Alertness: Awake/alert Behavior During Therapy: WFL for tasks assessed/performed Overall Cognitive Status:  Within Functional Limits for tasks assessed                                          Exercises General Exercises - Lower Extremity Long Arc Quad: Seated, AROM, Strengthening, Both, 10 reps Hip Flexion/Marching: Seated, AROM, Strengthening, Both, 10 reps Toe Raises: Seated, AROM, Strengthening, Both, 10 reps Heel Raises: Seated, AROM, Strengthening, Both, 10 reps    General Comments        Pertinent Vitals/Pain Pain Assessment Pain Assessment: Faces Faces Pain Scale: Hurts a little bit Pain Location: left knee Pain Descriptors / Indicators: Sore, Grimacing Pain Intervention(s): Limited activity within patient's tolerance, Monitored during session, Repositioned    Home Living                          Prior Function            PT Goals (current goals can now be found in the care plan section) Acute Rehab PT Goals Patient Stated Goal: return home with family to assist PT Goal Formulation: With patient Time For Goal Achievement: 03/20/22 Potential to Achieve Goals: Good Progress towards PT goals: Progressing toward goals    Frequency    Min 3X/week      PT Plan Current plan remains appropriate    Co-evaluation              AM-PAC PT "6 Clicks" Mobility   Outcome Measure  Help needed turning from your back to your side while in a flat bed without using bedrails?: None Help needed moving from lying on your back to sitting on the side of a flat bed without using bedrails?: None Help needed moving to and from a bed to a chair (including a wheelchair)?: A Little Help needed standing up from a chair using your arms (e.g., wheelchair or bedside chair)?: None Help needed to walk in hospital room?: A Little Help needed climbing 3-5 steps with a railing? : A Little 6 Click Score: 21    End of Session   Activity Tolerance: Patient tolerated treatment well;Patient limited by fatigue Patient left: in chair;with call bell/phone within  reach Nurse Communication: Mobility status PT Visit Diagnosis: Unsteadiness on feet (R26.81);Other abnormalities of gait and mobility (R26.89);Muscle weakness (generalized) (M62.81);Pain Pain - Right/Left: Left Pain - part of body: Knee     Time: 6222-9798 PT Time Calculation (min) (ACUTE ONLY): 32 min  Charges:  $Therapeutic Exercise: 8-22 mins $Therapeutic Activity: 8-22 mins                     12:03 PM, 03/17/22 Paula Long, MPT Physical Therapist with [facility] [phone] office [phone] mobile phone

## 2022-03-17 NOTE — Progress Notes (Addendum)
PROGRESS NOTE    Paula Long  TGG:[id number] DOB: 1951-02-28 DOA: 03/15/2022 PCP: Lindell Spar, MD    Brief Narrative:   71 year old white female history of COPD, chronic tobacco abuse, obesity BMI 31, hypertension, history of paroxysmal atrial flutter on Eliquis presented to hospital with cough.  Of note patient recently had a fall and laceration to the left knee that was stapled.  In the ED, patient was noted to have fever of 100.1 F but pulse ox was 94% on room air.  She also complained of 1 episode of fecal incontinence and generalized weakness.  Patient tested positive for COVID and was considered for admission to the hospital for further evaluation and treatment.    Assessment and Plan:  COVID-19 virus infection With generalized weakness and difficulty ambulating.  Unable to receive Paxlovid due to Eliquis and low GFR.  On Decadron due to wheezing.  Chest x-ray did not show any infiltrate.   Ambulatory dysfunction. Multifactorial etiology. Due to left knee injury and covid 19.  Physical therapy has recommended home health on discharge.   COPD (chronic obstructive pulmonary disease) (HCC) On Decadron..  Complains of cough and congestion.  Will add DuoNebs.  Add incentive spirometry.   Long term (current) use of anticoagulants - on Eliquis for hx of atrial flutter. Continue Eliquis 5 mg bid.   Paroxysmal atrial flutter (HCC) In normal sinus rhythm at this time.   Tobacco abuse Encouraged quitting.  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 Creatinine today at 2.2.  From 1.9 yesterday..  Baseline around 1.4-1.7.  Was volume depleted on presentation.  Continue IV fluids for 1 more day and reassess in AM.  Spoke with the patient's daughter who stated that she did have some kidney issue in the past.  Essential hypertension Continue amlodipine, labetalol, hydralazine.  Blood pressure seems to be stable.    DVT prophylaxis: SCDs Start: 03/16/22 0000 apixaban (ELIQUIS) tablet 5  mg   Code Status:     Code Status: Full Code  Disposition: Home with home health likely 03/18/2022  Status is: Observation  The patient will require care spanning > 2 midnights and should be moved to inpatient because:  Need for IV fluids, pending clinical improvement,   Family Communication: I spoke with the patient's daughter on the phone and updated her about the clinical condition of the patient.  Consultants:  None  Procedures:  None  Antimicrobials:  None  Anti-infectives (From admission, onward)    Start     Dose/Rate Route Frequency Ordered Stop   03/16/22 0000  nirmatrelvir/ritonavir (PAXLOVID) 3 tablet  Status:  Discontinued        3 tablet Oral 2 times daily 03/15/22 2359 03/16/22 0005        Subjective: Today, patient was seen and examined at bedside.  Patient complains of weakness and mild cough.  Denies any shortness of breath or dyspnea.  No nausea vomiting fever or chills  Objective: Vitals:   03/16/22 2113 03/17/22 0016 03/17/22 0322 03/17/22 0938  BP: 138/83 (!) 111/49 109/86 125/74  Pulse: 71 66 71 66  Resp:  18 20   Temp: 98.7 F (37.1 C) 98.7 F (37.1 C) 98.6 F (37 C)   TempSrc:      SpO2: 94% 94% 94%   Weight:   95.7 kg   Height:   '5\' 6"'$  (1.676 m)     Intake/Output Summary (Last 24 hours) at 03/17/2022 1210 Last data filed at 03/17/2022 0300 Gross per 24 hour  Intake 240 ml  Output 200 ml  Net 40 ml   Filed Weights   03/15/22 1747 03/16/22 1537 03/17/22 0322  Weight: 88.5 kg 94 kg 95.7 kg    Physical Examination: Body mass index is 34.05 kg/m.  General:  Average built, not in obvious distress HENT:   No scleral pallor or icterus noted. Oral mucosa is moist.  Chest:  Clear breath sounds.  Diminished breath sounds bilaterally. No crackles or wheezes.  CVS: S1 &S2 heard. No murmur.  Regular rate and rhythm. Abdomen: Soft, nontender, nondistended.  Bowel sounds are heard.   Extremities: No cyanosis, clubbing or edema.   Peripheral pulses are palpable.  Left knee with staples. Psych: Alert, awake and oriented, normal mood CNS:  No cranial nerve deficits.  Power equal in all extremities.   Skin: Warm and dry.  Left knee with staples.  Data Reviewed:   CBC: Recent Labs  Lab 03/15/22 2311 03/16/22 1445  WBC 8.5 7.5  NEUTROABS 6.5  --   HGB 12.8 12.7  HCT 39.9 40.0  MCV 97.3 97.3  PLT 196 197    Basic Metabolic Panel: Recent Labs  Lab 03/15/22 2311 03/17/22 0438  NA 137 139  K 4.5 4.5  CL 105 110  CO2 24 20*  GLUCOSE 105* 112*  BUN 26* 47*  CREATININE 1.93* 2.23*  CALCIUM 8.8* 8.8*  MG  --  2.2    Liver Function Tests: No results for input(s): "AST", "ALT", "ALKPHOS", "BILITOT", "PROT", "ALBUMIN" in the last 168 hours.   Radiology Studies: DG Chest Portable 1 View  Result Date: 03/15/2022 CLINICAL DATA:  Cough and weakness.  COVID positive EXAM: PORTABLE CHEST 1 VIEW COMPARISON:  03/09/2020 FINDINGS: Stable cardiomediastinal silhouette. Aortic atherosclerotic calcification. No focal consolidation, pleural effusion, or pneumothorax. No acute osseous abnormality. IMPRESSION: No active disease. Electronically Signed   By: Placido Sou M.D.   On: 03/15/2022 22:56      LOS: 0 days    Flora Lipps, MD [facility] Available via Epic secure chat 7am-7pm After these hours, please refer to coverage provider listed on [email or web address] 03/17/2022, 12:10 PM

## 2022-03-17 NOTE — Care Management Obs Status (Signed)
[place] NOTIFICATION   Patient Details  Name: Paula Long MRN: [id number] Date of Birth: 1950-06-08   Medicare Observation Status Notification Given:  Yes (copy mailed to [place], [place],  [id number] per patient's request)    Tommy Medal 03/17/2022, 10:48 AM

## 2022-03-18 DIAGNOSIS — I10 Essential (primary) hypertension: Secondary | ICD-10-CM | POA: Diagnosis not present

## 2022-03-18 DIAGNOSIS — U071 COVID-19: Secondary | ICD-10-CM | POA: Diagnosis not present

## 2022-03-18 DIAGNOSIS — R262 Difficulty in walking, not elsewhere classified: Secondary | ICD-10-CM | POA: Diagnosis not present

## 2022-03-18 DIAGNOSIS — J42 Unspecified chronic bronchitis: Secondary | ICD-10-CM | POA: Diagnosis not present

## 2022-03-18 LAB — BASIC METABOLIC PANEL
Anion gap: 5 (ref 5–15)
BUN: 50 mg/dL — ABNORMAL HIGH (ref 8–23)
CO2: 20 mmol/L — ABNORMAL LOW (ref 22–32)
Calcium: 8.4 mg/dL — ABNORMAL LOW (ref 8.9–10.3)
Chloride: 115 mmol/L — ABNORMAL HIGH (ref 98–111)
Creatinine, Ser: 2.03 mg/dL — ABNORMAL HIGH (ref 0.44–1.00)
GFR, Estimated: 26 mL/min — ABNORMAL LOW (ref >60)
Glucose, Bld: 112 mg/dL — ABNORMAL HIGH (ref 70–99)
Potassium: 4.4 mmol/L (ref 3.5–5.1)
Sodium: 140 mmol/L (ref 135–145)

## 2022-03-18 MED ORDER — GUAIFENESIN-DM 100-10 MG/5ML PO SYRP: 10.0000 mL | ORAL_SOLUTION | ORAL | 0 refills | Status: DC | PRN

## 2022-03-18 MED ORDER — ALBUTEROL SULFATE (2.5 MG/3ML) 0.083% IN NEBU: 2.5000 mg | INHALATION_SOLUTION | RESPIRATORY_TRACT | 1 refills | Status: DC | PRN

## 2022-03-18 MED ORDER — IPRATROPIUM-ALBUTEROL 0.5-2.5 (3) MG/3ML IN SOLN
3.0000 mL | Freq: Three times a day (TID) | RESPIRATORY_TRACT | Status: DC
  Administered 2022-03-18: 3 mL via RESPIRATORY_TRACT
  Filled 2022-03-18: qty 3

## 2022-03-18 MED ORDER — AMLODIPINE BESYLATE 10 MG PO TABS: 10.0000 mg | ORAL_TABLET | Freq: Every day | ORAL | 3 refills | Status: DC

## 2022-03-18 MED ORDER — DEXAMETHASONE 6 MG PO TABS: 6.0000 mg | ORAL_TABLET | Freq: Every day | ORAL | 0 refills | Status: AC

## 2022-03-18 NOTE — Progress Notes (Signed)
Pt discharged via WC to POV.

## 2022-03-18 NOTE — Discharge Summary (Signed)
Physician Discharge Summary  Paula Long IRS:[id number] DOB: 10-19-1950 DOA: 03/15/2022  PCP: Lindell Spar, MD  Admit date: 03/15/2022 Discharge date: 03/18/2022  Admitted From: Home  Discharge disposition: Home with home health PT  Recommendations for Outpatient Follow-Up:   Follow up with your primary care provider in one week.   Patient with left knee staples to be removed in the next visit. Check CBC, BMP, magnesium in the next visit Patient would benefit from nephrology follow-up as outpatient for her CKD.  Discharge Diagnosis:   Principal Problem:   COVID-19 virus infection Active Problems:   COPD (chronic obstructive pulmonary disease) (HCC)   Difficulty walking   Essential hypertension   Tobacco abuse   Paroxysmal atrial flutter (HCC)   Long term (current) use of anticoagulants - on Eliquis for hx of atrial flutter.  Discharge Condition: Improved.  Diet recommendation: Low sodium, heart healthy.   Wound care: None.  Code status: Full.   History of Present Illness:   71 year old white female history of COPD, chronic tobacco abuse, obesity BMI 31, hypertension, history of paroxysmal atrial flutter on Eliquis presented to hospital with cough.  Of note patient recently had a fall and laceration to the left knee that was stapled.  In the ED, patient was noted to have fever of 100.1 F but pulse ox was 94% on room air.  She also complained of 1 episode of fecal incontinence and generalized weakness.  Patient tested positive for COVID and was considered for admission to the hospital for further evaluation and treatment.   Hospital Course:   Following conditions were addressed during hospitalization as listed below,  COVID-19 virus infection Presented with with generalized weakness and difficulty ambulating some cough..  Unable to receive Paxlovid due to Eliquis and low GFR.  Received a Decadron which will be continued for few more days on discharge.  Chest  x-ray did not show any infiltrate.  Has some cough and will continue cough suppressant on discharge   Ambulatory dysfunction. Multifactorial etiology. Due to left knee injury and covid 19.  Physical therapy has recommended home health on discharge.  This has been arranged on discharge.   COPD (chronic obstructive pulmonary disease) (HCC) Continue nebulizer and inhalers at home.  Will continue few more days of Decadron on discharge.   Long term (current) use of anticoagulants - on Eliquis for hx of atrial flutter. Continue Eliquis 5 mg bid.   Paroxysmal atrial flutter (HCC) In normal sinus rhythm at this time.  On Eliquis.   Tobacco abuse Encouraged quitting.   Mild acute kidney injury on CKD IIIb. Creatinine today at 2.0 from baseline 1.4-1.7.  Was volume depleted on presentation and received IV fluids.  I have assisted her to follow-up with her primary care physician and get blood work done/resume follow-up with her nephrologist.  She was encouraged to drink little more fluid after discharge.   Essential hypertension Continue amlodipine, labetalol.  Disposition.  At this time, patient is stable for disposition home with outpatient PCP follow-up.  Medical Consultants:   None.  Procedures:    None Subjective:   Today, patient was seen and examined at bedside.  Denies any nausea, vomiting, fever, chills or rigor but has mild cough.  Wishes to go home.  Discharge Exam:   Vitals:   03/18/22 0431 03/18/22 0749  BP: (!) 149/60   Pulse: (!) 55   Resp: 18   Temp: 97.9 F (36.6 C)   SpO2: 90% 91%   Vitals:  03/17/22 1957 03/18/22 0431 03/18/22 0500 03/18/22 0749  BP: (!) 136/53 (!) 149/60    Pulse: 60 (!) 55    Resp: 18 18    Temp: 97.9 F (36.6 C) 97.9 F (36.6 C)    TempSrc: Oral     SpO2: 92% 90%  91%  Weight:      Height:   '5\' 6"'$  (1.676 m)    Body mass index is 34.05 kg/m.  General: Alert awake, not in obvious distress,  obese HENT: pupils equally reacting  to light,  No scleral pallor or icterus noted. Oral mucosa is moist.  Chest:    Diminished breath sounds bilaterally. No crackles or wheezes.  CVS: S1 &S2 heard. No murmur.  Regular rate and rhythm. Abdomen: Soft, nontender, nondistended.  Bowel sounds are heard.   Extremities: No cyanosis, clubbing or edema.  Peripheral pulses are palpable.  Left knee with staples. Psych: Alert, awake and oriented, normal mood CNS:  No cranial nerve deficits.  Power equal in all extremities.   Skin: Warm and dry.  No rashes noted.  The results of significant diagnostics from this hospitalization (including imaging, microbiology, ancillary and laboratory) are listed below for reference.     Diagnostic Studies:   DG Chest Portable 1 View  Result Date: 03/15/2022 CLINICAL DATA:  Cough and weakness.  COVID positive EXAM: PORTABLE CHEST 1 VIEW COMPARISON:  03/09/2020 FINDINGS: Stable cardiomediastinal silhouette. Aortic atherosclerotic calcification. No focal consolidation, pleural effusion, or pneumothorax. No acute osseous abnormality. IMPRESSION: No active disease. Electronically Signed   By: Placido Sou M.D.   On: 03/15/2022 22:56     Labs:   Basic Metabolic Panel: Recent Labs  Lab 03/15/22 2311 03/17/22 0438 03/18/22 0521  NA 137 139 140  K 4.5 4.5 4.4  CL 105 110 115*  CO2 24 20* 20*  GLUCOSE 105* 112* 112*  BUN 26* 47* 50*  CREATININE 1.93* 2.23* 2.03*  CALCIUM 8.8* 8.8* 8.4*  MG  --  2.2  --    GFR Estimated Creatinine Clearance: 30.1 mL/min (A) (by C-G formula based on SCr of 2.03 mg/dL (H)). Liver Function Tests: No results for input(s): "AST", "ALT", "ALKPHOS", "BILITOT", "PROT", "ALBUMIN" in the last 168 hours. No results for input(s): "LIPASE", "AMYLASE" in the last 168 hours. No results for input(s): "AMMONIA" in the last 168 hours. Coagulation profile No results for input(s): "INR", "PROTIME" in the last 168 hours.  CBC: Recent Labs  Lab 03/15/22 2311 03/16/22 1445  03/17/22 1242  WBC 8.5 7.5 13.0*  NEUTROABS 6.5  --   --   HGB 12.8 12.7 12.6  HCT 39.9 40.0 39.0  MCV 97.3 97.3 96.3  PLT 196 219 241   Cardiac Enzymes: No results for input(s): "CKTOTAL", "CKMB", "CKMBINDEX", "TROPONINI" in the last 168 hours. BNP: Invalid input(s): "POCBNP" CBG: No results for input(s): "GLUCAP" in the last 168 hours. D-Dimer No results for input(s): "DDIMER" in the last 72 hours. Hgb A1c No results for input(s): "HGBA1C" in the last 72 hours. Lipid Profile No results for input(s): "CHOL", "HDL", "LDLCALC", "TRIG", "CHOLHDL", "LDLDIRECT" in the last 72 hours. Thyroid function studies No results for input(s): "TSH", "T4TOTAL", "T3FREE", "THYROIDAB" in the last 72 hours.  Invalid input(s): "FREET3" Anemia work up No results for input(s): "VITAMINB12", "FOLATE", "FERRITIN", "TIBC", "IRON", "RETICCTPCT" in the last 72 hours. Microbiology Recent Results (from the past 240 hour(s))  Resp panel by RT-PCR (RSV, Flu A&B, Covid) Anterior Nasal Swab     Status: Abnormal  Collection Time: 03/15/22  5:50 PM   Specimen: Anterior Nasal Swab  Result Value Ref Range Status   SARS Coronavirus 2 by RT PCR POSITIVE (A) NEGATIVE Final    Comment: (NOTE) SARS-CoV-2 target nucleic acids are DETECTED.  The SARS-CoV-2 RNA is generally detectable in upper respiratory specimens during the acute phase of infection. Positive results are indicative of the presence of the identified virus, but do not rule out bacterial infection or co-infection with other pathogens not detected by the test. Clinical correlation with patient history and other diagnostic information is necessary to determine patient infection status. The expected result is Negative.  Fact Sheet for Patients: EntrepreneurPulse.com.au  Fact Sheet for Healthcare Providers: IncredibleEmployment.be  This test is not yet approved or cleared by the [place] FDA and  has been  authorized for detection and/or diagnosis of SARS-CoV-2 by FDA under an Emergency Use Authorization (EUA).  This EUA will remain in effect (meaning this test can be used) for the duration of  the COVID-19 declaration under Section 564(b)(1) of the A ct, 21 U.S.C. section 360bbb-3(b)(1), unless the authorization is terminated or revoked sooner.     Influenza A by PCR NEGATIVE NEGATIVE Final   Influenza B by PCR NEGATIVE NEGATIVE Final    Comment: (NOTE) The Xpert Xpress SARS-CoV-2/FLU/RSV plus assay is intended as an aid in the diagnosis of influenza from Nasopharyngeal swab specimens and should not be used as a sole basis for treatment. Nasal washings and aspirates are unacceptable for Xpert Xpress SARS-CoV-2/FLU/RSV testing.  Fact Sheet for Patients: EntrepreneurPulse.com.au  Fact Sheet for Healthcare Providers: IncredibleEmployment.be  This test is not yet approved or cleared by the [place] FDA and has been authorized for detection and/or diagnosis of SARS-CoV-2 by FDA under an Emergency Use Authorization (EUA). This EUA will remain in effect (meaning this test can be used) for the duration of the COVID-19 declaration under Section 564(b)(1) of the Act, 21 U.S.C. section 360bbb-3(b)(1), unless the authorization is terminated or revoked.     Resp Syncytial Virus by PCR NEGATIVE NEGATIVE Final    Comment: (NOTE) Fact Sheet for Patients: EntrepreneurPulse.com.au  Fact Sheet for Healthcare Providers: IncredibleEmployment.be  This test is not yet approved or cleared by the [place] FDA and has been authorized for detection and/or diagnosis of SARS-CoV-2 by FDA under an Emergency Use Authorization (EUA). This EUA will remain in effect (meaning this test can be used) for the duration of the COVID-19 declaration under Section 564(b)(1) of the Act, 21 U.S.C. section 360bbb-3(b)(1), unless the  authorization is terminated or revoked.  Performed at [facility], [address] [place], [place] [address]      Discharge Instructions:   Discharge Instructions     Ambulatory Referral for Lung Cancer Scre   Complete by: As directed    Diet - low sodium heart healthy   Complete by: As directed    Discharge instructions   Complete by: As directed    Follow-up with your primary care provider in 1 week.  Discuss about having nephrology follow-up for your kidneys.  Remove staples at that time.  No overexertion.  Seek medical attention for worsening symptoms.  Use inhalers at home.  Warm fluids.   Increase activity slowly   Complete by: As directed    Continue physical therapy at home.      Allergies as of 03/18/2022       Reactions   Hydrocodone-acetaminophen    Oxycodone-acetaminophen    Sulfa Antibiotics  Nystatin Rash   Oral rash   Percocet [oxycodone-acetaminophen] Rash   Oral rash   Vesicare [solifenacin] Rash   Vicodin [hydrocodone-acetaminophen] Rash   Oral rash        Medication List     STOP taking these medications    cyclobenzaprine 10 MG tablet Commonly known as: FLEXERIL   hydrALAZINE 50 MG tablet Commonly known as: APRESOLINE   ketoconazole 2 % cream Commonly known as: NIZORAL   meclizine 25 MG tablet Commonly known as: ANTIVERT   nitroGLYCERIN 0.4 MG SL tablet Commonly known as: NITROSTAT   nystatin powder Commonly known as: MYCOSTATIN/NYSTOP       TAKE these medications    albuterol 108 (90 Base) MCG/ACT inhaler Commonly known as: VENTOLIN HFA Inhale 2 puffs into the lungs every 6 (six) hours as needed for wheezing or shortness of breath. What changed: Another medication with the same name was changed. Make sure you understand how and when to take each.   albuterol (2.5 MG/3ML) 0.083% nebulizer solution Commonly known as: PROVENTIL Take 3 mLs (2.5 mg total) by nebulization every 4 (four) hours as needed for wheezing or  shortness of breath. What changed: See the new instructions.   amLODipine 10 MG tablet Commonly known as: NORVASC Take 1 tablet (10 mg total) by mouth daily.   atorvastatin 10 MG tablet Commonly known as: LIPITOR Take 1 tablet (10 mg total) by mouth daily.   desmopressin 0.2 MG tablet Commonly known as: DDAVP TAKE 1 TABLET BY MOUTH ONCE AT BEDTIME FOR BLADDER.   dexamethasone 6 MG tablet Commonly known as: DECADRON Take 1 tablet (6 mg total) by mouth daily for 3 days.   EasyMax Test test strip Generic drug: glucose blood USE TO TEST ONCE DAILY.   Eliquis 5 MG Tabs tablet Generic drug: apixaban TAKE (1) TABLET BY MOUTH TWICE DAILY.   Fluocinolone Acetonide Body 0.01 % Oil Commonly known as: Derma-Smoothe/FS Body Apply to scalp daily a three times a week   guaiFENesin-dextromethorphan 100-10 MG/5ML syrup Commonly known as: ROBITUSSIN DM Take 10 mLs by mouth every 4 (four) hours as needed for cough.   labetalol 200 MG tablet Commonly known as: NORMODYNE Take 1 tablet (200 mg total) by mouth 2 (two) times daily.   Magnesium 400 MG Caps Take 1 capsule by mouth daily.   pantoprazole 40 MG tablet Commonly known as: PROTONIX Take 1 tablet (40 mg total) by mouth daily.   pregabalin 50 MG capsule Commonly known as: Lyrica Take 1 capsule (50 mg total) by mouth 2 (two) times daily.   traZODone 150 MG tablet Commonly known as: DESYREL TAKE (1) TABLET BY MOUTH AT BEDTIME.        Follow-up Information     Lindell Spar, MD Follow up in 1 week(s).   Specialty: Internal Medicine Contact information: [address] [place] [address] [phone]                  Time coordinating discharge: 39 minutes  Signed:  [place] Stringfield  [facility] 03/18/2022, 8:49 AM

## 2022-03-18 NOTE — Plan of Care (Signed)
  Problem: Education: Goal: Knowledge of risk factors and measures for prevention of condition will improve Outcome: Adequate for Discharge   Problem: Coping: Goal: Psychosocial and spiritual needs will be supported Outcome: Adequate for Discharge   Problem: Respiratory: Goal: Will maintain a patent airway Outcome: Adequate for Discharge Goal: Complications related to the disease process, condition or treatment will be avoided or minimized Outcome: Adequate for Discharge   Problem: Education: Goal: Knowledge of General Education information will improve Description: Including pain rating scale, medication(s)/side effects and non-pharmacologic comfort measures Outcome: Adequate for Discharge   Problem: Health Behavior/Discharge Planning: Goal: Ability to manage health-related needs will improve Outcome: Adequate for Discharge   Problem: Clinical Measurements: Goal: Ability to maintain clinical measurements within normal limits will improve Outcome: Adequate for Discharge Goal: Will remain free from infection Outcome: Adequate for Discharge Goal: Diagnostic test results will improve Outcome: Adequate for Discharge Goal: Respiratory complications will improve Outcome: Adequate for Discharge Goal: Cardiovascular complication will be avoided Outcome: Adequate for Discharge   Problem: Activity: Goal: Risk for activity intolerance will decrease Outcome: Adequate for Discharge   Problem: Nutrition: Goal: Adequate nutrition will be maintained Outcome: Adequate for Discharge   Problem: Coping: Goal: Level of anxiety will decrease Outcome: Adequate for Discharge   Problem: Elimination: Goal: Will not experience complications related to bowel motility Outcome: Adequate for Discharge Goal: Will not experience complications related to urinary retention Outcome: Adequate for Discharge   Problem: Pain Managment: Goal: General experience of comfort will improve Outcome: Adequate  for Discharge   Problem: Safety: Goal: Ability to remain free from injury will improve Outcome: Adequate for Discharge   Problem: Skin Integrity: Goal: Risk for impaired skin integrity will decrease Outcome: Adequate for Discharge   Problem: Acute Rehab PT Goals(only PT should resolve) Goal: Pt Will Go Supine/Side To Sit Outcome: Adequate for Discharge Goal: Patient Will Transfer Sit To/From Stand Outcome: Adequate for Discharge Goal: Pt Will Transfer Bed To Chair/Chair To Bed Outcome: Adequate for Discharge Goal: Pt Will Ambulate Outcome: Adequate for Discharge

## 2022-03-21 ENCOUNTER — Telehealth: Payer: Self-pay | Admitting: *Deleted

## 2022-03-21 ENCOUNTER — Telehealth: Payer: Self-pay | Admitting: Internal Medicine

## 2022-03-21 ENCOUNTER — Other Ambulatory Visit: Payer: Self-pay | Admitting: Internal Medicine

## 2022-03-21 DIAGNOSIS — J418 Mixed simple and mucopurulent chronic bronchitis: Secondary | ICD-10-CM

## 2022-03-21 MED ORDER — ALBUTEROL SULFATE (2.5 MG/3ML) 0.083% IN NEBU: 2.5000 mg | INHALATION_SOLUTION | RESPIRATORY_TRACT | 1 refills | Status: DC | PRN

## 2022-03-21 NOTE — Telephone Encounter (Signed)
Pt returning call

## 2022-03-21 NOTE — Telephone Encounter (Signed)
Arby Barrette, Care Manager w/ [facility] with pt on line. Pt was admitted to [facility] for covid. They sent her home with nebulizer medication. She does not have a nebulizer. Wants to know if we can write an order for this?     [employer]

## 2022-03-21 NOTE — Patient Outreach (Signed)
  Care Coordination TOC Note Transition Care Management Follow-up Telephone Call Date of discharge and from where: Paula Long on 03/18/22 How have you been since you were released from the hospital? "I think I need an antibiotic. I'm coughing up green and tastes like infection." Any questions or concerns? Yes. Concerned that she may have a secondary bacterial infection. Would like an antibiotic if appropriate. Message sent to Dr Serita Grit office. She's waiting on a nebulizer that was ordered by his office today. Feels that will help once she gets it   Items Reviewed: Did the pt receive and understand the discharge instructions provided? Yes  Medications obtained and verified? Yes  Other? Yes . Reviewed worsening s/s and encouraged deep breathing exercises Any new allergies since your discharge? No  Dietary orders reviewed? Yes Do you have support at home? Yes   Home Care and Equipment/Supplies: Were home health services ordered? Yes. PT If so, what is the name of the agency? [facility]  Has the agency set up a time to come to the patient's home? No. Provided patient with telephone number, [phone]. She will call to setup.  Were any new equipment or medical supplies ordered?  No. Has a cane and walker What is the name of the medical supply agency?  Were you able to get the supplies/equipment? not applicable Do you have any questions related to the use of the equipment or supplies? No  Functional Questionnaire: (I = Independent and D = Dependent) ADLs: I  Bathing/Dressing- I  Meal Prep- I  Eating- I  Maintaining continence- I  Transferring/Ambulation- I  Managing Meds- I  Follow up appointments reviewed:  PCP Hospital f/u appt confirmed? Yes  Scheduled to see Dr Posey Pronto 03/23/22 at 3:00 for a follow-up. Staff message sent to [facility] to verify if this appt is suitable for a hosp follow-up and requested that they reach out to the patient if it is  not. [facility] f/u appt confirmed? No . Not indicated Are transportation arrangements needed? No  If their condition worsens, is the pt aware to call PCP or go to the Emergency Dept.? Yes Was the patient provided with contact information for the PCP's office or ED? Yes Was to pt encouraged to call back with questions or concerns? Yes  SDOH assessments and interventions completed:   Yes SDOH Interventions Today    Flowsheet Row Most Recent Value  SDOH Interventions   Transportation Interventions Intervention Not Indicated  Financial Strain Interventions Intervention Not Indicated       Care Coordination Interventions:  Reached out to Dr Serita Grit office regarding existing appt to verify if it is suitable for a hosp f/u. Also reached out regarding patient's concern that she might have an infection and is requesting an antibiotic.    Encounter Outcome:  Pt. Visit Completed    Chong Sicilian, BSN, RN-BC RN Care Coordinator [place] Direct Dial: [phone] Main #: [phone]

## 2022-03-23 ENCOUNTER — Encounter: Payer: Self-pay | Admitting: Internal Medicine

## 2022-03-23 ENCOUNTER — Ambulatory Visit (INDEPENDENT_AMBULATORY_CARE_PROVIDER_SITE_OTHER): Payer: Medicare HMO | Admitting: Internal Medicine

## 2022-03-23 VITALS — BP 136/68 | HR 64 | Ht 66.0 in | Wt 209.8 lb

## 2022-03-23 DIAGNOSIS — W19XXXD Unspecified fall, subsequent encounter: Secondary | ICD-10-CM

## 2022-03-23 DIAGNOSIS — Z09 Encounter for follow-up examination after completed treatment for conditions other than malignant neoplasm: Secondary | ICD-10-CM | POA: Diagnosis not present

## 2022-03-23 DIAGNOSIS — J189 Pneumonia, unspecified organism: Secondary | ICD-10-CM

## 2022-03-23 DIAGNOSIS — I10 Essential (primary) hypertension: Secondary | ICD-10-CM | POA: Diagnosis not present

## 2022-03-23 DIAGNOSIS — I4892 Unspecified atrial flutter: Secondary | ICD-10-CM

## 2022-03-23 DIAGNOSIS — J9621 Acute and chronic respiratory failure with hypoxia: Secondary | ICD-10-CM | POA: Diagnosis not present

## 2022-03-23 DIAGNOSIS — Z4802 Encounter for removal of sutures: Secondary | ICD-10-CM

## 2022-03-23 DIAGNOSIS — N1832 Chronic kidney disease, stage 3b: Secondary | ICD-10-CM | POA: Diagnosis not present

## 2022-03-23 DIAGNOSIS — W19XXXA Unspecified fall, initial encounter: Secondary | ICD-10-CM | POA: Insufficient documentation

## 2022-03-23 DIAGNOSIS — U071 COVID-19: Secondary | ICD-10-CM | POA: Diagnosis not present

## 2022-03-23 DIAGNOSIS — J9611 Chronic respiratory failure with hypoxia: Secondary | ICD-10-CM | POA: Insufficient documentation

## 2022-03-23 MED ORDER — CHERATUSSIN AC 100-10 MG/5ML PO SOLN: 5.0000 mL | Freq: Three times a day (TID) | ORAL | 0 refills | Status: DC | PRN

## 2022-03-23 MED ORDER — AZITHROMYCIN 250 MG PO TABS: ORAL_TABLET | ORAL | 0 refills | Status: AC

## 2022-03-23 NOTE — Progress Notes (Signed)
 Established Patient Office Visit  Subjective:  Patient ID: Paula Long, female    DOB: 08/04/1950  Age: 72 y.o. MRN: [id number]  CC:  Chief Complaint  Patient presents with   Suture / Staple Removal    Patient has nine staples in knee cap she needs removed    HPI Paula Long is a 72 y.o. 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  She was admitted with acute hypoxic respiratory failure due to COVID-19 infection from 03/15/22-03/18/22.  She was treated with IV steroids followed by oral steroids.  She still complains of cough with greenish-yellow expectoration and mild dyspnea.  She has had good relief with albuterol nebulizer being in the hospital, but does not have nebulizer currently.  She is planning to buy her nebulizer.  Her oxygen saturation was 91% on room air in resting conditions.  Her oxygen saturation dropped to 86% on ambulation on room air, but improved to 92% on 2 L O2.  She also had AKI on CKD.  She was given IV fluids for it.  She denies any dysuria or hematuria currently.  She also had a mechanical fall on a cement floor on 12/24.  She had laceration of left knee.  She had 7 staples and 3 sutures placed, which were removed today without complication.  Past Medical History:  Diagnosis Date   Anxiety    Arthritis 12-07-11   arthritis,DDD,spinal stenosis   Chronic back pain    COPD (chronic obstructive pulmonary disease) (HCC) 12-07-11   pt. uses nebulizer as needed and inhaler daily   Fibromyalgia    skin cancer   GERD (gastroesophageal reflux disease)    Hypertension 12-07-11   presently no meds   Normal cardiac stress test 06/2009   Skin cancer    melanoma.  face 2014   TIA (transient ischemic attack) ?    Past  Surgical History:  Procedure Laterality Date   ABDOMINAL HYSTERECTOMY     BACK SURGERY  12-07-11   2'10 L5 x2   COLONOSCOPY WITH PROPOFOL N/A 06/03/2014   Procedure: COLONOSCOPY WITH PROPOFOL; IN CECUM AT 0750; TOTAL WITHDRAWAL TIME 16 MINUTES;  Surgeon: Najeeb U Rehman, MD;  Location: [facility];  Service: Endoscopy;  Laterality: N/A;   left ankle surgery     tendonitis   LUMBAR LAMINECTOMY/DECOMPRESSION MICRODISCECTOMY  12/11/2011   Procedure: LUMBAR LAMINECTOMY/DECOMPRESSION MICRODISCECTOMY;  Surgeon: Ronald A Gioffre, MD;  Location: [facility];  Service: Orthopedics;  Laterality: Left;  Decompressive Lumbar Laminectomy L5-S1 on Left   POLYPECTOMY N/A 06/03/2014   Procedure: POLYPECTOMY;  Surgeon: Najeeb U Rehman, MD;  Location: [facility];  Service: Endoscopy;  Laterality: N/A;   right wrist surgery for pinched nerve     trigger finger surgery  12-07-11   rt. middle trigger finger release    Family History  Problem Relation Age of Onset   Cancer Mother        uterine   Cancer Father        lung    Social History   Socioeconomic History   Marital status: Single    Spouse name: Not on file   Number of children: Not on file   Years of education: Not on file   Highest education level: Not on file  Occupational History   Not   on file  Tobacco Use   Smoking status: Every Day    Packs/day: 1.50    Years: 48.00    Total pack years: 72.00    Types: Cigarettes    Start date: 01/07/1970   Smokeless tobacco: Never   Tobacco comments:    gone down to 1 pack per day   Vaping Use   Vaping Use: Never used  Substance and Sexual Activity   Alcohol use: No    Alcohol/week: 0.0 standard drinks of alcohol   Drug use: No   Sexual activity: Yes    Birth control/protection: Surgical  Other Topics Concern   Not on file  Social History Narrative   Not on file   Social Determinants of Health   Financial Resource Strain: Low Risk  (03/21/2022)   Overall Financial Resource Strain (CARDIA)     Difficulty of Paying Living Expenses: Not hard at all  Food Insecurity: No Food Insecurity (10/17/2021)   Hunger Vital Sign    Worried About Running Out of Food in the Last Year: Never true    Ran Out of Food in the Last Year: Never true  Transportation Needs: No Transportation Needs (03/21/2022)   PRAPARE - Transportation    Lack of Transportation (Medical): No    Lack of Transportation (Non-Medical): No  Physical Activity: Inactive (10/17/2021)   Exercise Vital Sign    Days of Exercise per Week: 0 days    Minutes of Exercise per Session: 0 min  Stress: No Stress Concern Present (10/17/2021)   [employer] - Occupational Stress Questionnaire    Feeling of Stress : Not at all  Social Connections: Moderately Isolated (10/17/2021)   Social Connection and Isolation Panel [NHANES]    Frequency of Communication with Friends and Family: More than three times a week    Frequency of Social Gatherings with Friends and Family: More than three times a week    Attends Religious Services: Never    Active Member of Clubs or Organizations: No    Attends Club or Organization Meetings: Never    Marital Status: Living with partner  Intimate Partner Violence: Not At Risk (10/17/2021)   Humiliation, Afraid, Rape, and Kick questionnaire    Fear of Current or Ex-Partner: No    Emotionally Abused: No    Physically Abused: No    Sexually Abused: No    Outpatient Medications Prior to Visit  Medication Sig Dispense Refill   albuterol (PROVENTIL) (2.5 MG/3ML) 0.083% nebulizer solution Take 3 mLs (2.5 mg total) by nebulization every 4 (four) hours as needed for wheezing or shortness of breath. 90 mL 1   albuterol (VENTOLIN HFA) 108 (90 Base) MCG/ACT inhaler Inhale 2 puffs into the lungs every 6 (six) hours as needed for wheezing or shortness of breath. 18 g 2   amLODipine (NORVASC) 10 MG tablet Take 1 tablet (10 mg total) by mouth daily. 90 tablet 3   atorvastatin (LIPITOR) 10 MG tablet  Take 1 tablet (10 mg total) by mouth daily. 90 tablet 3   desmopressin (DDAVP) 0.2 MG tablet TAKE 1 TABLET BY MOUTH ONCE AT BEDTIME FOR BLADDER. 90 tablet 0   EASYMAX TEST test strip USE TO TEST ONCE DAILY. 100 each 3   ELIQUIS 5 MG TABS tablet TAKE (1) TABLET BY MOUTH TWICE DAILY. 60 tablet 11   Fluocinolone Acetonide Body (DERMA-SMOOTHE/FS BODY) 0.01 % OIL Apply to scalp daily a three times a week (Patient not taking: Reported on 03/16/2022) 120 mL 1     labetalol (NORMODYNE) 200 MG tablet Take 1 tablet (200 mg total) by mouth 2 (two) times daily. (Patient not taking: Reported on 03/16/2022) 180 tablet 1   Magnesium 400 MG CAPS Take 1 capsule by mouth daily. (Patient not taking: Reported on 03/16/2022) 90 capsule 1   pantoprazole (PROTONIX) 40 MG tablet Take 1 tablet (40 mg total) by mouth daily. 90 tablet 3   pregabalin (LYRICA) 50 MG capsule Take 1 capsule (50 mg total) by mouth 2 (two) times daily. 60 capsule 3   traZODone (DESYREL) 150 MG tablet TAKE (1) TABLET BY MOUTH AT BEDTIME. 90 tablet 0   guaiFENesin-dextromethorphan (ROBITUSSIN DM) 100-10 MG/5ML syrup Take 10 mLs by mouth every 4 (four) hours as needed for cough. 118 mL 0   No facility-administered medications prior to visit.    Allergies  Allergen Reactions   Hydrocodone-Acetaminophen    Oxycodone-Acetaminophen    Sulfa Antibiotics    Nystatin Rash    Oral rash   Percocet [Oxycodone-Acetaminophen] Rash    Oral rash   Vesicare [Solifenacin] Rash   Vicodin [Hydrocodone-Acetaminophen] Rash    Oral rash    ROS Review of Systems  Constitutional:  Negative for chills and fever.  HENT:  Negative for congestion, sinus pressure, sinus pain and sore throat.   Eyes:  Negative for pain and discharge.  Respiratory:  Positive for cough, shortness of breath and wheezing.   Cardiovascular:  Negative for chest pain and palpitations.  Gastrointestinal:  Negative for abdominal pain, diarrhea, nausea and vomiting.  Endocrine: Negative  for polydipsia and polyuria.  Genitourinary:  Negative for dysuria and hematuria.  Musculoskeletal:  Positive for arthralgias and back pain. Negative for neck pain and neck stiffness.  Skin:  Positive for wound.  Neurological:  Positive for weakness. Negative for dizziness, syncope and headaches.  Psychiatric/Behavioral:  Positive for sleep disturbance. Negative for agitation and behavioral problems. The patient is not nervous/anxious.       Objective:    Physical Exam Vitals reviewed.  Constitutional:      General: She is not in acute distress.    Appearance: She is not diaphoretic.  HENT:     Head: Normocephalic and atraumatic.     Nose: Nose normal.     Mouth/Throat:     Mouth: Mucous membranes are moist.  Eyes:     General: No scleral icterus.    Extraocular Movements: Extraocular movements intact.  Cardiovascular:     Rate and Rhythm: Normal rate and regular rhythm.     Pulses: Normal pulses.     Heart sounds: Murmur (Systolic, right upper sternal border) heard.  Pulmonary:     Breath sounds: Wheezing (B/l) present. No rales.  Abdominal:     Palpations: Abdomen is soft.     Tenderness: There is no abdominal tenderness.  Musculoskeletal:        General: Tenderness (Lumbar spinal area) present.     Cervical back: Neck supple. No tenderness.     Left knee: Laceration present.     Right lower leg: No edema.     Left lower leg: No edema.     Comments: 7 staples and 3 sutures removed today  Skin:    General: Skin is warm and dry.  Neurological:     General: No focal deficit present.     Mental Status: She is alert and oriented to person, place, and time.     Sensory: No sensory deficit.     Motor: Weakness (3/5 in b/l LE, 4/5  in b/l UE) present.     Gait: Gait abnormal (Slow, unsteady).  Psychiatric:        Mood and Affect: Mood normal.        Behavior: Behavior normal.     BP (!) 145/73 (BP Location: Right Arm, Patient Position: Sitting, Cuff Size: Large)    Pulse 64   Ht 5' 6" (1.676 m)   Wt 209 lb 12.8 oz (95.2 kg)   SpO2 91%   BMI 33.86 kg/m  Wt Readings from Last 3 Encounters:  03/23/22 209 lb 12.8 oz (95.2 kg)  03/17/22 210 lb 15.7 oz (95.7 kg)  03/12/22 198 lb (89.8 kg)    Lab Results  Component Value Date   TSH 1.571 09/30/2018   Lab Results  Component Value Date   WBC 13.0 (H) 03/17/2022   HGB 12.6 03/17/2022   HCT 39.0 03/17/2022   MCV 96.3 03/17/2022   PLT 241 03/17/2022   Lab Results  Component Value Date   NA 140 03/18/2022   K 4.4 03/18/2022   CO2 20 (L) 03/18/2022   GLUCOSE 112 (H) 03/18/2022   BUN 50 (H) 03/18/2022   CREATININE 2.03 (H) 03/18/2022   BILITOT 0.4 03/09/2020   ALKPHOS 59 03/09/2020   AST 19 03/09/2020   ALT 14 03/09/2020   PROT 7.2 03/09/2020   ALBUMIN 4.0 03/09/2020   CALCIUM 8.4 (L) 03/18/2022   ANIONGAP 5 03/18/2022   EGFR 35 (L) 12/15/2020   Lab Results  Component Value Date   CHOL 123 12/15/2020   Lab Results  Component Value Date   HDL 70 12/15/2020   Lab Results  Component Value Date   LDLCALC 44 12/15/2020   Lab Results  Component Value Date   TRIG 33 12/15/2020   Lab Results  Component Value Date   CHOLHDL 1.8 12/15/2020   Lab Results  Component Value Date   HGBA1C 5.4 02/18/2020      Assessment & Plan:   COVID-19 virus infection Completed Paxlovid and steroids Persistent cough and dyspnea Started empiric azithromycin for secondary bacterial PPx  Community acquired pneumonia Has persistent cough and dyspnea Concern for bacterial super infection Started empiric azithromycin Has completed oral steroids   Acute on chronic hypoxic respiratory failure (HCC) Recently had COVID-19 infection Completed Paxlovid and oral steroids Still has dyspnea and O2 desats upon ambulation, would benefit from home O2 Continue albuterol neb/inhaler as needed for dyspnea or wheezing  Hospital discharge follow-up Hospital chart reviewed, including discharge  summary Medications reconciled and reviewed with the patient in detail Check CBC and BMP  Fall Had left knee laceration Staples and sutures removed today -tolerated procedure well Advised to use walking support to avoid fall  Essential hypertension BP Readings from Last 1 Encounters:  03/23/22 136/68   Well-controlled with Amlodipine 10 mg QD, Labetalol 200 mg BID and Lasix 40 mg PRN for leg swelling Hydralazine was recently discontinued during recent hospitalization Counseled for compliance with the medications Advised DASH diet and moderate exercise/walking as tolerated Follows up with Cardiologist  Paroxysmal atrial flutter (HCC) Currently in sinus rhythm On labetalol for rate control and HTN On Eliquis, needs to take it twice daily  CKD (chronic kidney disease) stage 3, GFR 30-59 ml/min (HCC) AKI likely due to dehydration CKD likely due to h/o uncontrolled HTN Avoid nephrotoxic agents including NSAIDs Followed by nephrology - but has stopped seeing them, referral sent again Check CMP On Lasix as needed for leg swelling    Meds ordered this encounter    Medications   azithromycin (ZITHROMAX) 250 MG tablet    Sig: Take 2 tablets on day 1, then 1 tablet daily on days 2 through 5    Dispense:  6 tablet    Refill:  0   guaiFENesin-codeine (CHERATUSSIN AC) 100-10 MG/5ML syrup    Sig: Take 5 mLs by mouth 3 (three) times daily as needed for cough.    Dispense:  120 mL    Refill:  0    Follow-up: Return in about 4 weeks (around 04/20/2022) for HTN and dyspnea.    Rutwik K Patel, MD 

## 2022-03-23 NOTE — Assessment & Plan Note (Addendum)
BP Readings from Last 1 Encounters:  03/23/22 136/68   Well-controlled with Amlodipine 10 mg QD, Labetalol 200 mg BID and Lasix 40 mg PRN for leg swelling Hydralazine was recently discontinued during recent hospitalization Counseled for compliance with the medications Advised DASH diet and moderate exercise/walking as tolerated Follows up with Cardiologist

## 2022-03-23 NOTE — Assessment & Plan Note (Signed)
Recently had COVID-19 infection Completed Paxlovid and oral steroids Still has dyspnea and O2 desats upon ambulation, would benefit from home O2 Continue albuterol neb/inhaler as needed for dyspnea or wheezing

## 2022-03-23 NOTE — Assessment & Plan Note (Signed)
AKI likely due to dehydration CKD likely due to h/o uncontrolled HTN Avoid nephrotoxic agents including NSAIDs Followed by nephrology - but has stopped seeing them, referral sent again Check CMP On Lasix as needed for leg swelling

## 2022-03-23 NOTE — Assessment & Plan Note (Signed)
Currently in sinus rhythm On labetalol for rate control and HTN On Eliquis, needs to take it twice daily

## 2022-03-23 NOTE — Assessment & Plan Note (Signed)
Completed Paxlovid and steroids Persistent cough and dyspnea Started empiric azithromycin for secondary bacterial PPx

## 2022-03-23 NOTE — Assessment & Plan Note (Signed)
Has persistent cough and dyspnea Concern for bacterial super infection Started empiric azithromycin Has completed oral steroids

## 2022-03-23 NOTE — Patient Instructions (Signed)
Please start taking Azithromycin as prescribed.  Please take Cheratussin as needed for cough.  Please continue taking other medications as prescribed.

## 2022-03-23 NOTE — Assessment & Plan Note (Signed)
Hospital chart reviewed, including discharge summary Medications reconciled and reviewed with the patient in detail Check CBC and BMP

## 2022-03-23 NOTE — Assessment & Plan Note (Signed)
Had left knee laceration Staples and sutures removed today -tolerated procedure well Advised to use walking support to avoid fall

## 2022-03-24 LAB — CBC WITH DIFFERENTIAL/PLATELET
Basophils Absolute: 0.1 10*3/uL (ref 0.0–0.2)
Basos: 0 %
EOS (ABSOLUTE): 0.1 10*3/uL (ref 0.0–0.4)
Eos: 0 %
Hematocrit: 39.1 % (ref 34.0–46.6)
Hemoglobin: 12.7 g/dL (ref 11.1–15.9)
Immature Grans (Abs): 0.3 10*3/uL — ABNORMAL HIGH (ref 0.0–0.1)
Immature Granulocytes: 2 %
Lymphocytes Absolute: 1.4 10*3/uL (ref 0.7–3.1)
Lymphs: 11 %
MCH: 30.7 pg (ref 26.6–33.0)
MCHC: 32.5 g/dL (ref 31.5–35.7)
MCV: 94 fL (ref 79–97)
Monocytes Absolute: 0.3 10*3/uL (ref 0.1–0.9)
Monocytes: 3 %
Neutrophils Absolute: 10.2 10*3/uL — ABNORMAL HIGH (ref 1.4–7.0)
Neutrophils: 84 %
Platelets: 245 10*3/uL (ref 150–450)
RBC: 4.14 x10E6/uL (ref 3.77–5.28)
RDW: 13.9 % (ref 11.7–15.4)
WBC: 12.2 10*3/uL — ABNORMAL HIGH (ref 3.4–10.8)

## 2022-03-24 LAB — BASIC METABOLIC PANEL
BUN/Creatinine Ratio: 18 (ref 12–28)
BUN: 29 mg/dL — ABNORMAL HIGH (ref 8–27)
CO2: 22 mmol/L (ref 20–29)
Calcium: 9.2 mg/dL (ref 8.7–10.3)
Chloride: 104 mmol/L (ref 96–106)
Creatinine, Ser: 1.64 mg/dL — ABNORMAL HIGH (ref 0.57–1.00)
Glucose: 131 mg/dL — ABNORMAL HIGH (ref 70–99)
Potassium: 4.9 mmol/L (ref 3.5–5.2)
Sodium: 141 mmol/L (ref 134–144)
eGFR: 33 mL/min/{1.73_m2} — ABNORMAL LOW (ref >59)

## 2022-03-28 ENCOUNTER — Telehealth: Payer: Self-pay | Admitting: Internal Medicine

## 2022-03-28 DIAGNOSIS — I4892 Unspecified atrial flutter: Secondary | ICD-10-CM | POA: Diagnosis not present

## 2022-03-28 DIAGNOSIS — I129 Hypertensive chronic kidney disease with stage 1 through stage 4 chronic kidney disease, or unspecified chronic kidney disease: Secondary | ICD-10-CM | POA: Diagnosis not present

## 2022-03-28 DIAGNOSIS — J449 Chronic obstructive pulmonary disease, unspecified: Secondary | ICD-10-CM | POA: Diagnosis not present

## 2022-03-28 DIAGNOSIS — M48061 Spinal stenosis, lumbar region without neurogenic claudication: Secondary | ICD-10-CM | POA: Diagnosis not present

## 2022-03-28 DIAGNOSIS — N1832 Chronic kidney disease, stage 3b: Secondary | ICD-10-CM | POA: Diagnosis not present

## 2022-03-28 DIAGNOSIS — G894 Chronic pain syndrome: Secondary | ICD-10-CM | POA: Diagnosis not present

## 2022-03-28 DIAGNOSIS — U071 COVID-19: Secondary | ICD-10-CM | POA: Diagnosis not present

## 2022-03-28 DIAGNOSIS — M797 Fibromyalgia: Secondary | ICD-10-CM | POA: Diagnosis not present

## 2022-03-28 DIAGNOSIS — M79605 Pain in left leg: Secondary | ICD-10-CM | POA: Diagnosis not present

## 2022-03-28 NOTE — Telephone Encounter (Signed)
Stacy from [facility] advised

## 2022-03-28 NOTE — Telephone Encounter (Signed)
Stacy, physical therapist with [facility] called in for verbal orders   2 week 3 1 week 5  Call back info [phone] Can leave verbal orders on vm

## 2022-04-05 DIAGNOSIS — J449 Chronic obstructive pulmonary disease, unspecified: Secondary | ICD-10-CM | POA: Diagnosis not present

## 2022-04-05 DIAGNOSIS — I129 Hypertensive chronic kidney disease with stage 1 through stage 4 chronic kidney disease, or unspecified chronic kidney disease: Secondary | ICD-10-CM | POA: Diagnosis not present

## 2022-04-05 DIAGNOSIS — M48061 Spinal stenosis, lumbar region without neurogenic claudication: Secondary | ICD-10-CM | POA: Diagnosis not present

## 2022-04-05 DIAGNOSIS — M797 Fibromyalgia: Secondary | ICD-10-CM | POA: Diagnosis not present

## 2022-04-05 DIAGNOSIS — N1832 Chronic kidney disease, stage 3b: Secondary | ICD-10-CM | POA: Diagnosis not present

## 2022-04-05 DIAGNOSIS — I4892 Unspecified atrial flutter: Secondary | ICD-10-CM | POA: Diagnosis not present

## 2022-04-05 DIAGNOSIS — M79605 Pain in left leg: Secondary | ICD-10-CM | POA: Diagnosis not present

## 2022-04-05 DIAGNOSIS — G894 Chronic pain syndrome: Secondary | ICD-10-CM | POA: Diagnosis not present

## 2022-04-05 DIAGNOSIS — U071 COVID-19: Secondary | ICD-10-CM | POA: Diagnosis not present

## 2022-04-07 DIAGNOSIS — I129 Hypertensive chronic kidney disease with stage 1 through stage 4 chronic kidney disease, or unspecified chronic kidney disease: Secondary | ICD-10-CM | POA: Diagnosis not present

## 2022-04-07 DIAGNOSIS — I4892 Unspecified atrial flutter: Secondary | ICD-10-CM | POA: Diagnosis not present

## 2022-04-07 DIAGNOSIS — M797 Fibromyalgia: Secondary | ICD-10-CM | POA: Diagnosis not present

## 2022-04-07 DIAGNOSIS — M79605 Pain in left leg: Secondary | ICD-10-CM | POA: Diagnosis not present

## 2022-04-07 DIAGNOSIS — N1832 Chronic kidney disease, stage 3b: Secondary | ICD-10-CM | POA: Diagnosis not present

## 2022-04-07 DIAGNOSIS — G894 Chronic pain syndrome: Secondary | ICD-10-CM | POA: Diagnosis not present

## 2022-04-07 DIAGNOSIS — J449 Chronic obstructive pulmonary disease, unspecified: Secondary | ICD-10-CM | POA: Diagnosis not present

## 2022-04-07 DIAGNOSIS — M48061 Spinal stenosis, lumbar region without neurogenic claudication: Secondary | ICD-10-CM | POA: Diagnosis not present

## 2022-04-07 DIAGNOSIS — U071 COVID-19: Secondary | ICD-10-CM | POA: Diagnosis not present

## 2022-04-10 DIAGNOSIS — N1832 Chronic kidney disease, stage 3b: Secondary | ICD-10-CM | POA: Diagnosis not present

## 2022-04-10 DIAGNOSIS — J449 Chronic obstructive pulmonary disease, unspecified: Secondary | ICD-10-CM | POA: Diagnosis not present

## 2022-04-10 DIAGNOSIS — M79605 Pain in left leg: Secondary | ICD-10-CM | POA: Diagnosis not present

## 2022-04-10 DIAGNOSIS — I4892 Unspecified atrial flutter: Secondary | ICD-10-CM | POA: Diagnosis not present

## 2022-04-10 DIAGNOSIS — I129 Hypertensive chronic kidney disease with stage 1 through stage 4 chronic kidney disease, or unspecified chronic kidney disease: Secondary | ICD-10-CM | POA: Diagnosis not present

## 2022-04-10 DIAGNOSIS — U071 COVID-19: Secondary | ICD-10-CM | POA: Diagnosis not present

## 2022-04-10 DIAGNOSIS — M48061 Spinal stenosis, lumbar region without neurogenic claudication: Secondary | ICD-10-CM | POA: Diagnosis not present

## 2022-04-10 DIAGNOSIS — G894 Chronic pain syndrome: Secondary | ICD-10-CM | POA: Diagnosis not present

## 2022-04-10 DIAGNOSIS — M797 Fibromyalgia: Secondary | ICD-10-CM | POA: Diagnosis not present

## 2022-04-12 DIAGNOSIS — N1832 Chronic kidney disease, stage 3b: Secondary | ICD-10-CM | POA: Diagnosis not present

## 2022-04-12 DIAGNOSIS — J449 Chronic obstructive pulmonary disease, unspecified: Secondary | ICD-10-CM | POA: Diagnosis not present

## 2022-04-12 DIAGNOSIS — E559 Vitamin D deficiency, unspecified: Secondary | ICD-10-CM | POA: Diagnosis not present

## 2022-04-12 DIAGNOSIS — I129 Hypertensive chronic kidney disease with stage 1 through stage 4 chronic kidney disease, or unspecified chronic kidney disease: Secondary | ICD-10-CM | POA: Diagnosis not present

## 2022-04-12 DIAGNOSIS — M48061 Spinal stenosis, lumbar region without neurogenic claudication: Secondary | ICD-10-CM | POA: Diagnosis not present

## 2022-04-12 DIAGNOSIS — I4892 Unspecified atrial flutter: Secondary | ICD-10-CM | POA: Diagnosis not present

## 2022-04-12 DIAGNOSIS — G894 Chronic pain syndrome: Secondary | ICD-10-CM | POA: Diagnosis not present

## 2022-04-12 DIAGNOSIS — M797 Fibromyalgia: Secondary | ICD-10-CM | POA: Diagnosis not present

## 2022-04-12 DIAGNOSIS — I5032 Chronic diastolic (congestive) heart failure: Secondary | ICD-10-CM | POA: Diagnosis not present

## 2022-04-12 DIAGNOSIS — N280 Ischemia and infarction of kidney: Secondary | ICD-10-CM | POA: Diagnosis not present

## 2022-04-12 DIAGNOSIS — M79605 Pain in left leg: Secondary | ICD-10-CM | POA: Diagnosis not present

## 2022-04-12 DIAGNOSIS — D841 Defects in the complement system: Secondary | ICD-10-CM | POA: Diagnosis not present

## 2022-04-12 DIAGNOSIS — E211 Secondary hyperparathyroidism, not elsewhere classified: Secondary | ICD-10-CM | POA: Diagnosis not present

## 2022-04-12 DIAGNOSIS — U071 COVID-19: Secondary | ICD-10-CM | POA: Diagnosis not present

## 2022-04-19 DIAGNOSIS — M797 Fibromyalgia: Secondary | ICD-10-CM | POA: Diagnosis not present

## 2022-04-19 DIAGNOSIS — M48061 Spinal stenosis, lumbar region without neurogenic claudication: Secondary | ICD-10-CM | POA: Diagnosis not present

## 2022-04-19 DIAGNOSIS — M79605 Pain in left leg: Secondary | ICD-10-CM | POA: Diagnosis not present

## 2022-04-19 DIAGNOSIS — I4892 Unspecified atrial flutter: Secondary | ICD-10-CM | POA: Diagnosis not present

## 2022-04-19 DIAGNOSIS — G894 Chronic pain syndrome: Secondary | ICD-10-CM | POA: Diagnosis not present

## 2022-04-19 DIAGNOSIS — J449 Chronic obstructive pulmonary disease, unspecified: Secondary | ICD-10-CM | POA: Diagnosis not present

## 2022-04-19 DIAGNOSIS — U071 COVID-19: Secondary | ICD-10-CM | POA: Diagnosis not present

## 2022-04-19 DIAGNOSIS — N1832 Chronic kidney disease, stage 3b: Secondary | ICD-10-CM | POA: Diagnosis not present

## 2022-04-19 DIAGNOSIS — I129 Hypertensive chronic kidney disease with stage 1 through stage 4 chronic kidney disease, or unspecified chronic kidney disease: Secondary | ICD-10-CM | POA: Diagnosis not present

## 2022-04-20 ENCOUNTER — Encounter: Payer: Self-pay | Admitting: Internal Medicine

## 2022-04-20 ENCOUNTER — Ambulatory Visit (INDEPENDENT_AMBULATORY_CARE_PROVIDER_SITE_OTHER): Payer: Medicare HMO | Admitting: Internal Medicine

## 2022-04-20 VITALS — BP 134/54 | HR 72 | Resp 18 | Ht 66.0 in | Wt 205.8 lb

## 2022-04-20 DIAGNOSIS — Z122 Encounter for screening for malignant neoplasm of respiratory organs: Secondary | ICD-10-CM | POA: Insufficient documentation

## 2022-04-20 DIAGNOSIS — F5104 Psychophysiologic insomnia: Secondary | ICD-10-CM

## 2022-04-20 DIAGNOSIS — K219 Gastro-esophageal reflux disease without esophagitis: Secondary | ICD-10-CM | POA: Diagnosis not present

## 2022-04-20 DIAGNOSIS — N3946 Mixed incontinence: Secondary | ICD-10-CM | POA: Diagnosis not present

## 2022-04-20 DIAGNOSIS — I10 Essential (primary) hypertension: Secondary | ICD-10-CM | POA: Diagnosis not present

## 2022-04-20 DIAGNOSIS — N1832 Chronic kidney disease, stage 3b: Secondary | ICD-10-CM | POA: Diagnosis not present

## 2022-04-20 DIAGNOSIS — J42 Unspecified chronic bronchitis: Secondary | ICD-10-CM | POA: Diagnosis not present

## 2022-04-20 DIAGNOSIS — J9611 Chronic respiratory failure with hypoxia: Secondary | ICD-10-CM

## 2022-04-20 DIAGNOSIS — R7303 Prediabetes: Secondary | ICD-10-CM

## 2022-04-20 DIAGNOSIS — Z720 Tobacco use: Secondary | ICD-10-CM | POA: Diagnosis not present

## 2022-04-20 DIAGNOSIS — I4892 Unspecified atrial flutter: Secondary | ICD-10-CM

## 2022-04-20 MED ORDER — IPRATROPIUM-ALBUTEROL 0.5-2.5 (3) MG/3ML IN SOLN: 3.0000 mL | Freq: Four times a day (QID) | RESPIRATORY_TRACT | 5 refills | Status: DC | PRN

## 2022-04-20 MED ORDER — TRAZODONE HCL 150 MG PO TABS: 150.0000 mg | ORAL_TABLET | Freq: Every day | ORAL | 1 refills | Status: DC

## 2022-04-20 MED ORDER — DEXLANSOPRAZOLE 60 MG PO CPDR: 60.0000 mg | DELAYED_RELEASE_CAPSULE | Freq: Every day | ORAL | 5 refills | Status: DC

## 2022-04-20 MED ORDER — LABETALOL HCL 200 MG PO TABS: 200.0000 mg | ORAL_TABLET | Freq: Two times a day (BID) | ORAL | 1 refills | Status: DC

## 2022-04-20 NOTE — Assessment & Plan Note (Signed)
Recently had COVID-19 infection Completed Paxlovid and oral steroids Still has dyspnea and O2 desats upon ambulation, would benefit from home O2 Continue albuterol neb/inhaler as needed for dyspnea or wheezing

## 2022-04-20 NOTE — Patient Instructions (Signed)
Please continue taking medications as prescribed.  Please continue to follow low salt diet and ambulate as tolerated.  Please increase fluid intake to at least 64 ounces in a day.

## 2022-04-20 NOTE — Assessment & Plan Note (Signed)
On Trazodone 150 mg QD

## 2022-04-20 NOTE — Assessment & Plan Note (Signed)
Smokes 1.5 pack/day Has > 20-pack-year smoking history Ordered low-dose CT chest after discussing with the patient.

## 2022-04-20 NOTE — Assessment & Plan Note (Signed)
Switched to [place] Has tried pantoprazole, Nexium and omeprazole

## 2022-04-20 NOTE — Assessment & Plan Note (Signed)
BP Readings from Last 1 Encounters:  04/20/22 (!) 134/54   Well-controlled with Amlodipine 10 mg QD and Labetalol 200 mg BID Needs to improve hydration Counseled for compliance with the medications Advised DASH diet and moderate exercise/walking as tolerated Follows up with Cardiologist

## 2022-04-20 NOTE — Progress Notes (Signed)
Established Patient Office Visit  Subjective:  Patient ID: Paula Long, female    DOB: 04-28-1950  Age: 72 y.o. MRN: [id number]  CC:  Chief Complaint  Patient presents with   Hypertension    Four week follow up     HPI Paula Long is a 72 y.o. 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  HTN: BP is well-controlled. Takes medications regularly. Patient denies headache, dizziness, chest pain, dyspnea or palpitations.   A Flutter: Denies dyspnea or palpitations. Takes Eliquis regularly, although mentions having cold sensation while she takes it.  CKD stage III: She has seen nephrology.  She denies any dysuria, hematuria, urinary hesitancy or resistance. She prefers to do blood tests in our office.  She complains of chronic low back pain and burning pain in her b/l LE.  She has chronic weakness of bilateral LE.  She was on Cymbalta for neuropathy, but it did not work for her.  Of note, she used to go to pain clinic for lumbar radiculopathy, and take Dilaudid 4 mg twice daily, but states that it did not help her pain and has stopped taking it.  Denies any recent fall or injury.  Denies any saddle anesthesia or stool incontinence. She was given Lyrica in the last visit, which has been helping her.  COPD: She reports dyspnea upon minimal exertion.  She was given albuterol nebulizer for better compliance and easier use, but was not able to get nebulizer machine through pharmacy.  She denies any fever or chills.  She still smokes 1.5 pack/day.  Her oxygen saturation was 91% on room air in resting conditions.  Her oxygen saturation dropped to 86% on ambulation on room air, but improved to 92% on 2 L O2.   Past Medical History:  Diagnosis Date    Anxiety    Arthritis 12-07-11   arthritis,DDD,spinal stenosis   Chronic back pain    COPD (chronic obstructive pulmonary disease) ([place]) 12-07-11   pt. uses nebulizer as needed and inhaler daily   Fibromyalgia    skin cancer   GERD (gastroesophageal reflux disease)    Hypertension 12-07-11   presently no meds   Normal cardiac stress test 06/2009   Skin cancer    melanoma.  face 2014   TIA (transient ischemic attack) ?    Past Surgical History:  Procedure Laterality Date   ABDOMINAL HYSTERECTOMY     BACK SURGERY  12-07-11   2'10 L5 x2   COLONOSCOPY WITH PROPOFOL N/A 06/03/2014   Procedure: COLONOSCOPY WITH PROPOFOL; IN CECUM AT 0750; TOTAL WITHDRAWAL TIME 16 MINUTES;  Surgeon: Rogene Houston, MD;  Location: [facility];  Service: Endoscopy;  Laterality: N/A;   left ankle surgery     tendonitis   LUMBAR LAMINECTOMY/DECOMPRESSION MICRODISCECTOMY  12/11/2011   Procedure: LUMBAR LAMINECTOMY/DECOMPRESSION MICRODISCECTOMY;  Surgeon: Tobi Bastos, MD;  Location: [facility];  Service: Orthopedics;  Laterality: Left;  Decompressive Lumbar Laminectomy L5-S1 on Left   POLYPECTOMY N/A 06/03/2014   Procedure: POLYPECTOMY;  Surgeon: Rogene Houston, MD;  Location: [facility];  Service: Endoscopy;  Laterality: N/A;   right wrist surgery for pinched nerve     trigger finger surgery  12-07-11   rt. middle trigger finger release    Family History  Problem Relation Age  of Onset   Cancer Mother        uterine   Cancer Father        lung    Social History   Socioeconomic History   Marital status: Single    Spouse name: Not on file   Number of children: Not on file   Years of education: Not on file   Highest education level: Not on file  Occupational History   Not on file  Tobacco Use   Smoking status: Every Day    Packs/day: 1.50    Years: 48.00    Total pack years: 72.00    Types: Cigarettes    Start date: 01/07/1970   Smokeless tobacco: Never   Tobacco comments:    gone down to 1 pack per day    Vaping Use   Vaping Use: Never used  Substance and Sexual Activity   Alcohol use: No    Alcohol/week: 0.0 standard drinks of alcohol   Drug use: No   Sexual activity: Yes    Birth control/protection: Surgical  Other Topics Concern   Not on file  Social History Narrative   Not on file   Social Determinants of Health   Financial Resource Strain: Low Risk  (03/21/2022)   Overall Financial Resource Strain (CARDIA)    Difficulty of Paying Living Expenses: Not hard at all  Food Insecurity: No Food Insecurity (10/17/2021)   Hunger Vital Sign    Worried About Running Out of Food in the Last Year: Never true    Ran Out of Food in the Last Year: Never true  Transportation Needs: No Transportation Needs (03/21/2022)   PRAPARE - Hydrologist (Medical): No    Lack of Transportation (Non-Medical): No  Physical Activity: Inactive (10/17/2021)   Exercise Vital Sign    Days of Exercise per Week: 0 days    Minutes of Exercise per Session: 0 min  Stress: No Stress Concern Present (10/17/2021)   [place]    Feeling of Stress : Not at all  Social Connections: Moderately Isolated (10/17/2021)   Social Connection and Isolation Panel [NHANES]    Frequency of Communication with Friends and Family: More than three times a week    Frequency of Social Gatherings with Friends and Family: More than three times a week    Attends Religious Services: Never    Marine scientist or Organizations: No    Attends Archivist Meetings: Never    Marital Status: Living with partner  Intimate Partner Violence: Not At Risk (10/17/2021)   Humiliation, Afraid, Rape, and Kick questionnaire    Fear of Current or Ex-Partner: No    Emotionally Abused: No    Physically Abused: No    Sexually Abused: No    Outpatient Medications Prior to Visit  Medication Sig Dispense Refill   albuterol (VENTOLIN HFA) 108 (90  Base) MCG/ACT inhaler Inhale 2 puffs into the lungs every 6 (six) hours as needed for wheezing or shortness of breath. 18 g 2   amLODipine (NORVASC) 10 MG tablet Take 1 tablet (10 mg total) by mouth daily. 90 tablet 3   atorvastatin (LIPITOR) 10 MG tablet Take 1 tablet (10 mg total) by mouth daily. 90 tablet 3   desmopressin (DDAVP) 0.2 MG tablet TAKE 1 TABLET BY MOUTH ONCE AT BEDTIME FOR BLADDER. 90 tablet 0   EASYMAX TEST test strip USE TO TEST ONCE DAILY. 100 each  3   ELIQUIS 5 MG TABS tablet TAKE (1) TABLET BY MOUTH TWICE DAILY. 60 tablet 11   Fluocinolone Acetonide Body (DERMA-SMOOTHE/FS BODY) 0.01 % OIL Apply to scalp daily a three times a week (Patient not taking: Reported on 03/16/2022) 120 mL 1   guaiFENesin-codeine (CHERATUSSIN AC) 100-10 MG/5ML syrup Take 5 mLs by mouth 3 (three) times daily as needed for cough. 120 mL 0   pregabalin (LYRICA) 50 MG capsule Take 1 capsule (50 mg total) by mouth 2 (two) times daily. 60 capsule 3   albuterol (PROVENTIL) (2.5 MG/3ML) 0.083% nebulizer solution Take 3 mLs (2.5 mg total) by nebulization every 4 (four) hours as needed for wheezing or shortness of breath. 90 mL 1   labetalol (NORMODYNE) 200 MG tablet Take 1 tablet (200 mg total) by mouth 2 (two) times daily. (Patient not taking: Reported on 03/16/2022) 180 tablet 1   Magnesium 400 MG CAPS Take 1 capsule by mouth daily. (Patient not taking: Reported on 03/16/2022) 90 capsule 1   pantoprazole (PROTONIX) 40 MG tablet Take 1 tablet (40 mg total) by mouth daily. 90 tablet 3   traZODone (DESYREL) 150 MG tablet TAKE (1) TABLET BY MOUTH AT BEDTIME. 90 tablet 0   No facility-administered medications prior to visit.    Allergies  Allergen Reactions   Hydrocodone-Acetaminophen    Oxycodone-Acetaminophen    Sulfa Antibiotics    Nystatin Rash    Oral rash   Percocet [Oxycodone-Acetaminophen] Rash    Oral rash   Vesicare [Solifenacin] Rash   Vicodin [Hydrocodone-Acetaminophen] Rash    Oral rash     ROS Review of Systems  Constitutional:  Negative for chills and fever.  HENT:  Negative for congestion, sinus pressure, sinus pain and sore throat.   Eyes:  Negative for pain and discharge.  Respiratory:  Positive for cough and shortness of breath.   Cardiovascular:  Negative for chest pain and palpitations.  Gastrointestinal:  Negative for abdominal pain, diarrhea, nausea and vomiting.  Endocrine: Negative for polydipsia and polyuria.  Genitourinary:  Negative for dysuria and hematuria.  Musculoskeletal:  Positive for arthralgias and back pain. Negative for neck pain and neck stiffness.  Skin:  Negative for wound.  Neurological:  Positive for weakness. Negative for dizziness, syncope and headaches.  Psychiatric/Behavioral:  Positive for sleep disturbance. Negative for agitation and behavioral problems. The patient is not nervous/anxious.       Objective:    Physical Exam Vitals reviewed.  Constitutional:      General: She is not in acute distress.    Appearance: She is not diaphoretic.  HENT:     Head: Normocephalic and atraumatic.     Nose: Nose normal.     Mouth/Throat:     Mouth: Mucous membranes are moist.  Eyes:     General: No scleral icterus.    Extraocular Movements: Extraocular movements intact.  Cardiovascular:     Rate and Rhythm: Normal rate and regular rhythm.     Pulses: Normal pulses.     Heart sounds: Murmur (Systolic, right upper sternal border) heard.  Pulmonary:     Breath sounds: Wheezing (Mild, diffuse) present. No rales.  Abdominal:     Palpations: Abdomen is soft.     Tenderness: There is no abdominal tenderness.  Musculoskeletal:        General: Tenderness (Lumbar spinal area) present.     Cervical back: Neck supple. No tenderness.     Right lower leg: No edema.     Left lower  leg: No edema.  Skin:    General: Skin is warm and dry.  Neurological:     General: No focal deficit present.     Mental Status: She is alert and oriented to  person, place, and time.     Sensory: No sensory deficit.     Motor: Weakness (3/5 in b/l LE, 4/5 in b/l UE) present.     Gait: Gait abnormal (Slow, unsteady).  Psychiatric:        Mood and Affect: Mood normal.        Behavior: Behavior normal.     BP (!) 134/54 (BP Location: Right Arm, Cuff Size: Normal)   Pulse 72   Resp 18   Ht '5\' 6"'$  (1.676 m)   Wt 205 lb 12.8 oz (93.4 kg)   SpO2 91%   BMI 33.22 kg/m  Wt Readings from Last 3 Encounters:  04/20/22 205 lb 12.8 oz (93.4 kg)  03/23/22 209 lb 12.8 oz (95.2 kg)  03/17/22 210 lb 15.7 oz (95.7 kg)    Lab Results  Component Value Date   TSH 1.571 09/30/2018   Lab Results  Component Value Date   WBC 12.2 (H) 03/23/2022   HGB 12.7 03/23/2022   HCT 39.1 03/23/2022   MCV 94 03/23/2022   PLT 245 03/23/2022   Lab Results  Component Value Date   NA 141 03/23/2022   K 4.9 03/23/2022   CO2 22 03/23/2022   GLUCOSE 131 (H) 03/23/2022   BUN 29 (H) 03/23/2022   CREATININE 1.64 (H) 03/23/2022   BILITOT 0.4 03/09/2020   ALKPHOS 59 03/09/2020   AST 19 03/09/2020   ALT 14 03/09/2020   PROT 7.2 03/09/2020   ALBUMIN 4.0 03/09/2020   CALCIUM 9.2 03/23/2022   ANIONGAP 5 03/18/2022   EGFR 33 (L) 03/23/2022   Lab Results  Component Value Date   CHOL 123 12/15/2020   Lab Results  Component Value Date   HDL 70 12/15/2020   Lab Results  Component Value Date   LDLCALC 44 12/15/2020   Lab Results  Component Value Date   TRIG 33 12/15/2020   Lab Results  Component Value Date   CHOLHDL 1.8 12/15/2020   Lab Results  Component Value Date   HGBA1C 5.4 02/18/2020      Assessment & Plan:   Problem List Items Addressed This Visit       Cardiovascular and Mediastinum   Essential hypertension - Primary    BP Readings from Last 1 Encounters:  04/20/22 (!) 134/54  Well-controlled with Amlodipine 10 mg QD and Labetalol 200 mg BID Needs to improve hydration Counseled for compliance with the medications Advised DASH diet  and moderate exercise/walking as tolerated Follows up with Cardiologist      Relevant Medications   labetalol (NORMODYNE) 200 MG tablet   Paroxysmal atrial flutter (HCC)    Currently in sinus rhythm On labetalol for rate control and HTN On Eliquis, needs to take it twice daily      Relevant Medications   labetalol (NORMODYNE) 200 MG tablet     Respiratory   COPD (chronic obstructive pulmonary disease) ([place])    Uncontrolled with Albuterol PRN Switched to Duoneb for better compliance and easier use       Relevant Medications   ipratropium-albuterol (DUONEB) 0.5-2.5 (3) MG/3ML SOLN   Chronic hypoxic respiratory failure (HCC)    Recently had COVID-19 infection Completed Paxlovid and oral steroids Still has dyspnea and O2 desats upon ambulation, would benefit from home  O2 Continue albuterol neb/inhaler as needed for dyspnea or wheezing      Relevant Medications   ipratropium-albuterol (DUONEB) 0.5-2.5 (3) MG/3ML SOLN   Other Relevant Orders   CBC with Differential/Platelet   For home use only DME oxygen     Digestive   Gastroesophageal reflux disease without esophagitis    Switched to [place] Has tried pantoprazole, Nexium and omeprazole      Relevant Medications   dexlansoprazole (DEXILANT) 60 MG capsule     Genitourinary   CKD (chronic kidney disease) stage 3, GFR 30-59 ml/min (HCC)    AKI likely due to dehydration CKD likely due to h/o uncontrolled HTN Avoid nephrotoxic agents including NSAIDs Followed by nephrology Check BMP, complement and urine protein/creatinine ratio      Relevant Orders   CBC with Differential/Platelet   Basic Metabolic Panel (BMET)   Complement, total   C3 complement   C4 complement   Protein / creatinine ratio, urine     Other   Tobacco abuse    Smokes about 1.5 pack/day  Asked about quitting: confirms that he/she currently smokes cigarettes Advise to quit smoking: Educated about QUITTING to reduce the risk of cancer, cardio  and cerebrovascular disease. Assess willingness: Unwilling to quit at this time, but is working on cutting back. Assist with counseling and pharmacotherapy: Counseled for 5 minutes and literature provided. Arrange for follow up: follow up in 3 months and continue to offer help.      Relevant Orders   CT CHEST LUNG CANCER SCREENING LOW DOSE WO CONTRAST   Chronic insomnia    On Trazodone 150 mg QD      Relevant Medications   traZODone (DESYREL) 150 MG tablet   Urinary incontinence, mixed    Well controlled with desmopressin      Screening for lung cancer    Smokes 1.5 pack/day Has > 20-pack-year smoking history Ordered low-dose CT chest after discussing with the patient.       Relevant Orders   CT CHEST LUNG CANCER SCREENING LOW DOSE WO CONTRAST   Other Visit Diagnoses     Prediabetes       Relevant Orders   Basic Metabolic Panel (BMET)   Hemoglobin A1c      Meds ordered this encounter  Medications   labetalol (NORMODYNE) 200 MG tablet    Sig: Take 1 tablet (200 mg total) by mouth 2 (two) times daily.    Dispense:  180 tablet    Refill:  1   traZODone (DESYREL) 150 MG tablet    Sig: Take 1 tablet (150 mg total) by mouth at bedtime.    Dispense:  90 tablet    Refill:  1   dexlansoprazole (DEXILANT) 60 MG capsule    Sig: Take 1 capsule (60 mg total) by mouth daily.    Dispense:  30 capsule    Refill:  5   ipratropium-albuterol (DUONEB) 0.5-2.5 (3) MG/3ML SOLN    Sig: Take 3 mLs by nebulization every 6 (six) hours as needed.    Dispense:  360 mL    Refill:  5    Please dispense nebulizer as well. Mail order    Follow-up: Return in about 4 months (around 08/19/2022) for HTN and COPD.    Lindell Spar, MD

## 2022-04-20 NOTE — Assessment & Plan Note (Addendum)
Uncontrolled with Albuterol PRN Switched to Duoneb for better compliance and easier use

## 2022-04-20 NOTE — Assessment & Plan Note (Signed)
Currently in sinus rhythm On labetalol for rate control and HTN On Eliquis, needs to take it twice daily

## 2022-04-20 NOTE — Assessment & Plan Note (Signed)
Well controlled with desmopressin

## 2022-04-20 NOTE — Assessment & Plan Note (Addendum)
AKI likely due to dehydration CKD likely due to h/o uncontrolled HTN Avoid nephrotoxic agents including NSAIDs Followed by nephrology Check BMP, complement and urine protein/creatinine ratio

## 2022-04-20 NOTE — Assessment & Plan Note (Signed)
Smokes about 1.5 pack/day  Asked about quitting: confirms that he/she currently smokes cigarettes Advise to quit smoking: Educated about QUITTING to reduce the risk of cancer, cardio and cerebrovascular disease. Assess willingness: Unwilling to quit at this time, but is working on cutting back. Assist with counseling and pharmacotherapy: Counseled for 5 minutes and literature provided. Arrange for follow up: follow up in 3 months and continue to offer help.

## 2022-04-21 ENCOUNTER — Telehealth: Payer: Self-pay | Admitting: Internal Medicine

## 2022-04-21 NOTE — Telephone Encounter (Signed)
Lewis w/[facility] [phone]) called and stated that the patient declined the script for oxygen because she does not have the funds for it.

## 2022-04-23 LAB — BASIC METABOLIC PANEL
BUN/Creatinine Ratio: 14 (ref 12–28)
BUN: 25 mg/dL (ref 8–27)
CO2: 20 mmol/L (ref 20–29)
Calcium: 9.3 mg/dL (ref 8.7–10.3)
Chloride: 105 mmol/L (ref 96–106)
Creatinine, Ser: 1.77 mg/dL — ABNORMAL HIGH (ref 0.57–1.00)
Glucose: 94 mg/dL (ref 70–99)
Potassium: 4.7 mmol/L (ref 3.5–5.2)
Sodium: 143 mmol/L (ref 134–144)
eGFR: 31 mL/min/{1.73_m2} — ABNORMAL LOW (ref >59)

## 2022-04-23 LAB — CBC WITH DIFFERENTIAL/PLATELET
Basophils Absolute: 0.1 10*3/uL (ref 0.0–0.2)
Basos: 1 %
EOS (ABSOLUTE): 0.1 10*3/uL (ref 0.0–0.4)
Eos: 1 %
Hematocrit: 39.2 % (ref 34.0–46.6)
Hemoglobin: 13 g/dL (ref 11.1–15.9)
Immature Grans (Abs): 0.1 10*3/uL (ref 0.0–0.1)
Immature Granulocytes: 1 %
Lymphocytes Absolute: 1.8 10*3/uL (ref 0.7–3.1)
Lymphs: 17 %
MCH: 31.1 pg (ref 26.6–33.0)
MCHC: 33.2 g/dL (ref 31.5–35.7)
MCV: 94 fL (ref 79–97)
Monocytes Absolute: 0.6 10*3/uL (ref 0.1–0.9)
Monocytes: 6 %
Neutrophils Absolute: 8 10*3/uL — ABNORMAL HIGH (ref 1.4–7.0)
Neutrophils: 74 %
Platelets: 310 10*3/uL (ref 150–450)
RBC: 4.18 x10E6/uL (ref 3.77–5.28)
RDW: 13.6 % (ref 11.7–15.4)
WBC: 10.7 10*3/uL (ref 3.4–10.8)

## 2022-04-23 LAB — COMPLEMENT, TOTAL: Compl, Total (CH50): >60 U/mL (ref >41)

## 2022-04-23 LAB — PROTEIN / CREATININE RATIO, URINE
Creatinine, Urine: 253.1 mg/dL
Protein, Ur: 9.9 mg/dL
Protein/Creat Ratio: 39 mg/g creat (ref 0–200)

## 2022-04-23 LAB — HEMOGLOBIN A1C
Est. average glucose Bld gHb Est-mCnc: 126 mg/dL
Hgb A1c MFr Bld: 6 % — ABNORMAL HIGH (ref 4.8–5.6)

## 2022-04-23 LAB — C3 COMPLEMENT: Complement C3, Serum: 127 mg/dL (ref 82–167)

## 2022-04-23 LAB — C4 COMPLEMENT: Complement C4, Serum: 17 mg/dL (ref 12–38)

## 2022-05-01 DIAGNOSIS — H524 Presbyopia: Secondary | ICD-10-CM | POA: Diagnosis not present

## 2022-05-23 ENCOUNTER — Other Ambulatory Visit: Payer: Self-pay

## 2022-05-23 ENCOUNTER — Other Ambulatory Visit: Payer: Self-pay | Admitting: Internal Medicine

## 2022-05-23 DIAGNOSIS — K551 Chronic vascular disorders of intestine: Secondary | ICD-10-CM

## 2022-05-23 DIAGNOSIS — I6523 Occlusion and stenosis of bilateral carotid arteries: Secondary | ICD-10-CM

## 2022-05-23 DIAGNOSIS — N3281 Overactive bladder: Secondary | ICD-10-CM

## 2022-05-26 ENCOUNTER — Other Ambulatory Visit: Payer: Self-pay | Admitting: Internal Medicine

## 2022-05-26 DIAGNOSIS — U071 COVID-19: Secondary | ICD-10-CM

## 2022-06-01 ENCOUNTER — Encounter (HOSPITAL_COMMUNITY): Payer: Medicare HMO

## 2022-06-01 ENCOUNTER — Ambulatory Visit: Payer: Medicare HMO

## 2022-06-09 ENCOUNTER — Ambulatory Visit (HOSPITAL_COMMUNITY): Payer: Medicare HMO

## 2022-06-12 ENCOUNTER — Ambulatory Visit (HOSPITAL_COMMUNITY): Payer: Medicare HMO | Attending: Surgery

## 2022-06-12 ENCOUNTER — Ambulatory Visit (HOSPITAL_COMMUNITY): Payer: Medicare HMO

## 2022-06-12 ENCOUNTER — Ambulatory Visit: Payer: Medicare HMO

## 2022-06-23 ENCOUNTER — Other Ambulatory Visit: Payer: Self-pay | Admitting: Internal Medicine

## 2022-06-23 DIAGNOSIS — E782 Mixed hyperlipidemia: Secondary | ICD-10-CM

## 2022-08-21 ENCOUNTER — Encounter: Payer: Self-pay | Admitting: Internal Medicine

## 2022-08-21 ENCOUNTER — Ambulatory Visit (INDEPENDENT_AMBULATORY_CARE_PROVIDER_SITE_OTHER): Payer: Medicare HMO | Admitting: Internal Medicine

## 2022-08-21 VITALS — BP 126/62 | HR 67 | Ht 66.0 in | Wt 200.0 lb

## 2022-08-21 DIAGNOSIS — N3281 Overactive bladder: Secondary | ICD-10-CM

## 2022-08-21 DIAGNOSIS — I4892 Unspecified atrial flutter: Secondary | ICD-10-CM | POA: Diagnosis not present

## 2022-08-21 DIAGNOSIS — F5104 Psychophysiologic insomnia: Secondary | ICD-10-CM

## 2022-08-21 DIAGNOSIS — J42 Unspecified chronic bronchitis: Secondary | ICD-10-CM

## 2022-08-21 DIAGNOSIS — E782 Mixed hyperlipidemia: Secondary | ICD-10-CM | POA: Diagnosis not present

## 2022-08-21 DIAGNOSIS — M48062 Spinal stenosis, lumbar region with neurogenic claudication: Secondary | ICD-10-CM

## 2022-08-21 DIAGNOSIS — J418 Mixed simple and mucopurulent chronic bronchitis: Secondary | ICD-10-CM | POA: Diagnosis not present

## 2022-08-21 DIAGNOSIS — F17210 Nicotine dependence, cigarettes, uncomplicated: Secondary | ICD-10-CM

## 2022-08-21 DIAGNOSIS — I10 Essential (primary) hypertension: Secondary | ICD-10-CM

## 2022-08-21 DIAGNOSIS — Z720 Tobacco use: Secondary | ICD-10-CM

## 2022-08-21 DIAGNOSIS — I6523 Occlusion and stenosis of bilateral carotid arteries: Secondary | ICD-10-CM | POA: Diagnosis not present

## 2022-08-21 DIAGNOSIS — R7303 Prediabetes: Secondary | ICD-10-CM | POA: Diagnosis not present

## 2022-08-21 DIAGNOSIS — N1832 Chronic kidney disease, stage 3b: Secondary | ICD-10-CM | POA: Diagnosis not present

## 2022-08-21 DIAGNOSIS — G6289 Other specified polyneuropathies: Secondary | ICD-10-CM

## 2022-08-21 MED ORDER — DESMOPRESSIN ACETATE 0.2 MG PO TABS: 0.2000 mg | ORAL_TABLET | Freq: Every day | ORAL | 3 refills | Status: DC

## 2022-08-21 MED ORDER — ALBUTEROL SULFATE HFA 108 (90 BASE) MCG/ACT IN AERS: 2.0000 | INHALATION_SPRAY | Freq: Four times a day (QID) | RESPIRATORY_TRACT | 2 refills | Status: DC | PRN

## 2022-08-21 MED ORDER — ATORVASTATIN CALCIUM 10 MG PO TABS: 10.0000 mg | ORAL_TABLET | Freq: Every day | ORAL | 0 refills | Status: DC

## 2022-08-21 MED ORDER — APIXABAN 5 MG PO TABS: 5.0000 mg | ORAL_TABLET | Freq: Two times a day (BID) | ORAL | 11 refills | Status: DC

## 2022-08-21 MED ORDER — PREGABALIN 75 MG PO CAPS: 75.0000 mg | ORAL_CAPSULE | Freq: Two times a day (BID) | ORAL | 3 refills | Status: DC

## 2022-08-21 NOTE — Assessment & Plan Note (Signed)
BP Readings from Last 1 Encounters:  08/21/22 126/62   Well-controlled Was on Amlodipine 10 mg QD and Labetalol 200 mg BID, now well-controlled without medications Needs to improve hydration Counseled for compliance with the medications Advised DASH diet and moderate exercise/walking as tolerated Follows up with Cardiologist

## 2022-08-21 NOTE — Assessment & Plan Note (Signed)
Uncontrolled with Albuterol PRN Switched to Duoneb for better compliance and easier use, will contact DME as she has not been able to get it yet

## 2022-08-21 NOTE — Assessment & Plan Note (Addendum)
Followed by Vascular surgery On statin On Eliquis for A flutter

## 2022-08-21 NOTE — Assessment & Plan Note (Signed)
Currently in sinus rhythm Was on labetalol for rate control and HTN, but now rate-controlled without medication On Eliquis, needs to take it twice daily

## 2022-08-21 NOTE — Assessment & Plan Note (Signed)
CKD likely due to h/o uncontrolled HTN Avoid nephrotoxic agents including NSAIDs Followed by nephrology Check BMP and urine protein/creatinine ratio

## 2022-08-21 NOTE — Patient Instructions (Signed)
Please start taking Lyrica 75 mg twice daily.  Please continue to take medications as prescribed.  Please continue to follow low salt diet and ambulate as tolerated.

## 2022-08-21 NOTE — Progress Notes (Unsigned)
Established Patient Office Visit  Subjective:  Patient ID: Paula Long, female    DOB: [birth year]  Age: 72 y.o. MRN: [id number]  CC:  Chief Complaint  Patient presents with   Hypertension    Follow up for hypertension. Patient is having pain and difficulties walking    HPI Paula Long is a 72 y.o. 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  HTN: BP is well-controlled. Takes medications regularly. Patient denies headache, dizziness, chest pain, dyspnea or palpitations.   A Flutter: Denies dyspnea or palpitations. Takes Eliquis regularly, although mentions having cold sensation while she takes it.  CKD stage III: She has seen nephrology.  She denies any dysuria, hematuria, urinary hesitancy or resistance. She prefers to do blood tests in our office.  She complains of chronic low back pain and burning pain in her b/l LE.  She has chronic weakness of bilateral LE.  She was on Cymbalta for neuropathy, but it did not work for her.  Of note, she used to go to pain clinic for lumbar radiculopathy, and take Dilaudid 4 mg twice daily, but states that it did not help her pain and has stopped taking it.  Denies any recent fall or injury.  Denies any saddle anesthesia or stool incontinence. She was given Lyrica in the last visit, which has been helping her.  COPD: She reports dyspnea upon minimal exertion.  She was given albuterol nebulizer for better compliance and easier use, but was not able to get nebulizer machine through pharmacy.  She denies any fever or chills.  She still smokes 1.5 pack/day.  Her oxygen saturation was 91% on room air in resting conditions.  Her oxygen saturation dropped to 86% on ambulation on room air, but improved to 92% on 2 L O2.    Past Medical History:  Diagnosis Date   Anxiety    Arthritis 12-07-11   arthritis,DDD,spinal stenosis   Chronic back pain    COPD (chronic obstructive pulmonary disease) (HCC) 12-07-11   pt. uses nebulizer as needed and inhaler daily   Fibromyalgia    skin cancer   GERD (gastroesophageal reflux disease)    Hypertension 12-07-11   presently no meds   Normal cardiac stress test 06/2009   Skin cancer    melanoma.  face 2014   TIA (transient ischemic attack) ?    Past Surgical History:  Procedure Laterality Date   ABDOMINAL HYSTERECTOMY     BACK SURGERY  12-07-11   2'10 L5 x2   COLONOSCOPY WITH PROPOFOL N/A 06/03/2014   Procedure: COLONOSCOPY WITH PROPOFOL; IN CECUM AT 0750; TOTAL WITHDRAWAL TIME 16 MINUTES;  Surgeon: Malissa Hippo, MD;  Location: [facility];  Service: Endoscopy;  Laterality: N/A;   left ankle surgery     tendonitis   LUMBAR LAMINECTOMY/DECOMPRESSION MICRODISCECTOMY  12/11/2011   Procedure: LUMBAR LAMINECTOMY/DECOMPRESSION MICRODISCECTOMY;  Surgeon: Jacki Cones, MD;  Location: [facility];  Service: Orthopedics;  Laterality: Left;  Decompressive Lumbar Laminectomy L5-S1 on Left   POLYPECTOMY N/A 06/03/2014   Procedure: POLYPECTOMY;  Surgeon: Malissa Hippo, MD;  Location: [facility];  Service: Endoscopy;  Laterality: N/A;   right wrist surgery for pinched nerve     trigger finger surgery  12-07-11   rt. middle trigger finger release    Family  History  Problem Relation Age of Onset   Cancer Mother        uterine   Cancer Father        lung    Social History   Socioeconomic History   Marital status: Single    Spouse name: Not on file   Number of children: Not on file   Years of education: Not on file   Highest education level: Not on file  Occupational History   Not on file  Tobacco Use   Smoking status: Every Day    Packs/day: 1.50    Years: 48.00    Additional pack years: 0.00    Total pack years: 72.00    Types: Cigarettes    Start date: 01/07/1970    Smokeless tobacco: Never   Tobacco comments:    gone down to 1 pack per day   Vaping Use   Vaping Use: Never used  Substance and Sexual Activity   Alcohol use: No    Alcohol/week: 0.0 standard drinks of alcohol   Drug use: No   Sexual activity: Yes    Birth control/protection: Surgical  Other Topics Concern   Not on file  Social History Narrative   Not on file   Social Determinants of Health   Financial Resource Strain: Low Risk  (03/21/2022)   Overall Financial Resource Strain (CARDIA)    Difficulty of Paying Living Expenses: Not hard at all  Food Insecurity: No Food Insecurity (10/17/2021)   Hunger Vital Sign    Worried About Running Out of Food in the Last Year: Never true    Ran Out of Food in the Last Year: Never true  Transportation Needs: No Transportation Needs (03/21/2022)   PRAPARE - Administrator, Civil Service (Medical): No    Lack of Transportation (Non-Medical): No  Physical Activity: Inactive (10/17/2021)   Exercise Vital Sign    Days of Exercise per Week: 0 days    Minutes of Exercise per Session: 0 min  Stress: No Stress Concern Present (10/17/2021)   [employer] of Occupational Health - Occupational Stress Questionnaire    Feeling of Stress : Not at all  Social Connections: Moderately Isolated (10/17/2021)   Social Connection and Isolation Panel [NHANES]    Frequency of Communication with Friends and Family: More than three times a week    Frequency of Social Gatherings with Friends and Family: More than three times a week    Attends Religious Services: Never    Database administrator or Organizations: No    Attends Banker Meetings: Never    Marital Status: Living with partner  Intimate Partner Violence: Not At Risk (10/17/2021)   Humiliation, Afraid, Rape, and Kick questionnaire    Fear of Current or Ex-Partner: No    Emotionally Abused: No    Physically Abused: No    Sexually Abused: No    Outpatient Medications Prior to  Visit  Medication Sig Dispense Refill   albuterol (VENTOLIN HFA) 108 (90 Base) MCG/ACT inhaler Inhale 2 puffs into the lungs every 6 (six) hours as needed for wheezing or shortness of breath. 18 g 2   amLODipine (NORVASC) 10 MG tablet Take 1 tablet (10 mg total) by mouth daily. 90 tablet 3   atorvastatin (LIPITOR) 10 MG tablet TAKE 1 TABLET BY MOUTH DAILY. 90 tablet 0   desmopressin (DDAVP) 0.2 MG tablet TAKE 1 TABLET BY MOUTH ONCE AT BEDTIME FOR BLADDER. 90 tablet 0   dexlansoprazole (DEXILANT)  60 MG capsule Take 1 capsule (60 mg total) by mouth daily. 30 capsule 5   EASYMAX TEST test strip USE TO TEST ONCE DAILY. 100 each 3   ELIQUIS 5 MG TABS tablet TAKE (1) TABLET BY MOUTH TWICE DAILY. 60 tablet 11   Fluocinolone Acetonide Body (DERMA-SMOOTHE/FS BODY) 0.01 % OIL Apply to scalp daily a three times a week (Patient not taking: Reported on 03/16/2022) 120 mL 1   guaiFENesin-codeine (CHERATUSSIN AC) 100-10 MG/5ML syrup Take 5 mLs by mouth 3 (three) times daily as needed for cough. 120 mL 0   ipratropium-albuterol (DUONEB) 0.5-2.5 (3) MG/3ML SOLN Take 3 mLs by nebulization every 6 (six) hours as needed. 360 mL 5   labetalol (NORMODYNE) 200 MG tablet Take 1 tablet (200 mg total) by mouth 2 (two) times daily. 180 tablet 1   pregabalin (LYRICA) 50 MG capsule Take 1 capsule (50 mg total) by mouth 2 (two) times daily. 60 capsule 3   traZODone (DESYREL) 150 MG tablet Take 1 tablet (150 mg total) by mouth at bedtime. 90 tablet 1   No facility-administered medications prior to visit.    Allergies  Allergen Reactions   Hydrocodone-Acetaminophen    Oxycodone-Acetaminophen    Sulfa Antibiotics    Nystatin Rash    Oral rash   Percocet [Oxycodone-Acetaminophen] Rash    Oral rash   Vesicare [Solifenacin] Rash   Vicodin [Hydrocodone-Acetaminophen] Rash    Oral rash    ROS Review of Systems  Constitutional:  Negative for chills and fever.  HENT:  Negative for congestion, sinus pressure, sinus pain  and sore throat.   Eyes:  Negative for pain and discharge.  Respiratory:  Positive for cough and shortness of breath.   Cardiovascular:  Negative for chest pain and palpitations.  Gastrointestinal:  Negative for abdominal pain, diarrhea, nausea and vomiting.  Endocrine: Negative for polydipsia and polyuria.  Genitourinary:  Negative for dysuria and hematuria.  Musculoskeletal:  Positive for arthralgias and back pain. Negative for neck pain and neck stiffness.  Skin:  Negative for wound.  Neurological:  Positive for weakness. Negative for dizziness, syncope and headaches.  Psychiatric/Behavioral:  Positive for sleep disturbance. Negative for agitation and behavioral problems. The patient is not nervous/anxious.       Objective:    Physical Exam Vitals reviewed.  Constitutional:      General: She is not in acute distress.    Appearance: She is not diaphoretic.  HENT:     Head: Normocephalic and atraumatic.     Nose: Nose normal.     Mouth/Throat:     Mouth: Mucous membranes are moist.  Eyes:     General: No scleral icterus.    Extraocular Movements: Extraocular movements intact.  Cardiovascular:     Rate and Rhythm: Normal rate and regular rhythm.     Pulses: Normal pulses.     Heart sounds: Murmur (Systolic, right upper sternal border) heard.  Pulmonary:     Breath sounds: Wheezing (Mild, diffuse) present. No rales.  Abdominal:     Palpations: Abdomen is soft.     Tenderness: There is no abdominal tenderness.  Musculoskeletal:        General: Tenderness (Lumbar spinal area) present.     Cervical back: Neck supple. No tenderness.     Right lower leg: No edema.     Left lower leg: No edema.  Skin:    General: Skin is warm and dry.  Neurological:     General: No focal deficit present.  Mental Status: She is alert and oriented to person, place, and time.     Sensory: No sensory deficit.     Motor: Weakness (3/5 in b/l LE, 4/5 in b/l UE) present.     Gait: Gait abnormal  (Slow, unsteady).  Psychiatric:        Mood and Affect: Mood normal.        Behavior: Behavior normal.     BP 126/62 (BP Location: Left Arm, Patient Position: Sitting, Cuff Size: Normal)   Pulse 67   Ht 5\' 6"  (1.676 m)   Wt 200 lb (90.7 kg)   SpO2 94%   BMI 32.28 kg/m  Wt Readings from Last 3 Encounters:  08/21/22 200 lb (90.7 kg)  04/20/22 205 lb 12.8 oz (93.4 kg)  03/23/22 209 lb 12.8 oz (95.2 kg)    Lab Results  Component Value Date   TSH 1.571 09/30/2018   Lab Results  Component Value Date   WBC 10.7 04/20/2022   HGB 13.0 04/20/2022   HCT 39.2 04/20/2022   MCV 94 04/20/2022   PLT 310 04/20/2022   Lab Results  Component Value Date   NA 143 04/20/2022   K 4.7 04/20/2022   CO2 20 04/20/2022   GLUCOSE 94 04/20/2022   BUN 25 04/20/2022   CREATININE 1.77 (H) 04/20/2022   BILITOT 0.4 03/09/2020   ALKPHOS 59 03/09/2020   AST 19 03/09/2020   ALT 14 03/09/2020   PROT 7.2 03/09/2020   ALBUMIN 4.0 03/09/2020   CALCIUM 9.3 04/20/2022   ANIONGAP 5 03/18/2022   EGFR 31 (L) 04/20/2022   Lab Results  Component Value Date   CHOL 123 12/15/2020   Lab Results  Component Value Date   HDL 70 12/15/2020   Lab Results  Component Value Date   LDLCALC 44 12/15/2020   Lab Results  Component Value Date   TRIG 33 12/15/2020   Lab Results  Component Value Date   CHOLHDL 1.8 12/15/2020   Lab Results  Component Value Date   HGBA1C 6.0 (H) 04/20/2022      Assessment & Plan:   Problem List Items Addressed This Visit   None No orders of the defined types were placed in this encounter.   Follow-up: No follow-ups on file.    Anabel Halon, MD

## 2022-08-22 ENCOUNTER — Telehealth: Payer: Self-pay | Admitting: Internal Medicine

## 2022-08-22 ENCOUNTER — Encounter: Payer: Self-pay | Admitting: Internal Medicine

## 2022-08-22 LAB — HEMOGLOBIN A1C
Est. average glucose Bld gHb Est-mCnc: 126 mg/dL
Hgb A1c MFr Bld: 6 % — ABNORMAL HIGH (ref 4.8–5.6)

## 2022-08-22 LAB — CMP14+EGFR
ALT: 11 IU/L (ref 0–32)
AST: 13 IU/L (ref 0–40)
Albumin/Globulin Ratio: 1.5 (ref 1.2–2.2)
Albumin: 4 g/dL (ref 3.9–4.9)
Alkaline Phosphatase: 76 IU/L (ref 44–121)
BUN/Creatinine Ratio: 12 (ref 12–28)
BUN: 21 mg/dL (ref 8–27)
Bilirubin Total: <0.2 mg/dL (ref 0.0–1.2)
CO2: 24 mmol/L (ref 20–29)
Calcium: 9.2 mg/dL (ref 8.7–10.3)
Chloride: 103 mmol/L (ref 96–106)
Creatinine, Ser: 1.82 mg/dL — ABNORMAL HIGH (ref 0.57–1.00)
Globulin, Total: 2.7 g/dL (ref 1.5–4.5)
Glucose: 91 mg/dL (ref 70–99)
Potassium: 4.5 mmol/L (ref 3.5–5.2)
Sodium: 141 mmol/L (ref 134–144)
Total Protein: 6.7 g/dL (ref 6.0–8.5)
eGFR: 30 mL/min/{1.73_m2} — ABNORMAL LOW (ref >59)

## 2022-08-22 LAB — LIPID PANEL
Chol/HDL Ratio: 1.6 ratio (ref 0.0–4.4)
Cholesterol, Total: 99 mg/dL — ABNORMAL LOW (ref 100–199)
HDL: 62 mg/dL (ref >39)
LDL Chol Calc (NIH): 26 mg/dL (ref 0–99)
Triglycerides: 40 mg/dL (ref 0–149)
VLDL Cholesterol Cal: 11 mg/dL (ref 5–40)

## 2022-08-22 LAB — CBC WITH DIFFERENTIAL/PLATELET
Basophils Absolute: 0.1 10*3/uL (ref 0.0–0.2)
Basos: 1 %
EOS (ABSOLUTE): 0.1 10*3/uL (ref 0.0–0.4)
Eos: 1 %
Hematocrit: 39.3 % (ref 34.0–46.6)
Hemoglobin: 13 g/dL (ref 11.1–15.9)
Immature Grans (Abs): 0 10*3/uL (ref 0.0–0.1)
Immature Granulocytes: 0 %
Lymphocytes Absolute: 2.4 10*3/uL (ref 0.7–3.1)
Lymphs: 28 %
MCH: 31.3 pg (ref 26.6–33.0)
MCHC: 33.1 g/dL (ref 31.5–35.7)
MCV: 95 fL (ref 79–97)
Monocytes Absolute: 0.5 10*3/uL (ref 0.1–0.9)
Monocytes: 6 %
Neutrophils Absolute: 5.4 10*3/uL (ref 1.4–7.0)
Neutrophils: 64 %
Platelets: 242 10*3/uL (ref 150–450)
RBC: 4.16 x10E6/uL (ref 3.77–5.28)
RDW: 13.1 % (ref 11.7–15.4)
WBC: 8.5 10*3/uL (ref 3.4–10.8)

## 2022-08-22 LAB — TSH: TSH: 2.18 u[IU]/mL (ref 0.450–4.500)

## 2022-08-22 NOTE — Assessment & Plan Note (Signed)
On statin.

## 2022-08-22 NOTE — Assessment & Plan Note (Signed)
On Trazodone 150 mg QD 

## 2022-08-22 NOTE — Telephone Encounter (Signed)
Prescriptions are ready for pick up at [facility] , left message to let patient know that her medication was ready to contact pharmacy

## 2022-08-22 NOTE — Assessment & Plan Note (Signed)
Now better with desmopressin 0.2 mg daily 

## 2022-08-22 NOTE — Assessment & Plan Note (Signed)
Has tried Cymbalta with no relief Was placed on Lyrica 25 mg twice daily, but did not continue it Started Lyrica 75 mg BID, she needs to take it regularly Does not prefer any Neurology referral as she cannot drive to [facility]

## 2022-08-22 NOTE — Assessment & Plan Note (Signed)
Smokes about 1 pack/day, has cut down from 2 packs/day  Asked about quitting: confirms that he/she currently smokes cigarettes Advise to quit smoking: Educated about QUITTING to reduce the risk of cancer, cardio and cerebrovascular disease. Assess willingness: Unwilling to quit at this time, but is working on cutting back. Assist with counseling and pharmacotherapy: Counseled for 5 minutes and literature provided. Arrange for follow up: follow up in 3 months and continue to offer help.

## 2022-08-22 NOTE — Assessment & Plan Note (Signed)
S/p lumbar spine surgeries Has chronic low back pain, used to see [facility] and later [facility] pain clinic Has stopped opioid medications Restart Lyrica, increased dose to 75 mg BID

## 2022-08-22 NOTE — Telephone Encounter (Signed)
Patient called said she did not get any of her medications that Dr Allena Katz called into [facility] yesterday, can you please resend.  albuterol (VENTOLIN HFA) 108 (90 Base) MCG/ACT inhaler [[id number]]   apixaban (ELIQUIS) 5 MG TABS tablet [[id number]]   atorvastatin (LIPITOR) 10 MG tablet [[id number]]   desmopressin (DDAVP) 0.2 MG tablet [[id number]]   [facility]

## 2022-11-27 DIAGNOSIS — D485 Neoplasm of uncertain behavior of skin: Secondary | ICD-10-CM | POA: Diagnosis not present

## 2022-11-27 DIAGNOSIS — L0100 Impetigo, unspecified: Secondary | ICD-10-CM | POA: Diagnosis not present

## 2022-11-27 DIAGNOSIS — L309 Dermatitis, unspecified: Secondary | ICD-10-CM | POA: Diagnosis not present

## 2022-11-27 DIAGNOSIS — B354 Tinea corporis: Secondary | ICD-10-CM | POA: Diagnosis not present

## 2022-11-27 DIAGNOSIS — L570 Actinic keratosis: Secondary | ICD-10-CM | POA: Diagnosis not present

## 2022-11-27 DIAGNOSIS — L308 Other specified dermatitis: Secondary | ICD-10-CM | POA: Diagnosis not present

## 2022-12-07 DIAGNOSIS — L84 Corns and callosities: Secondary | ICD-10-CM | POA: Diagnosis not present

## 2022-12-07 DIAGNOSIS — B351 Tinea unguium: Secondary | ICD-10-CM | POA: Diagnosis not present

## 2022-12-07 DIAGNOSIS — M79676 Pain in unspecified toe(s): Secondary | ICD-10-CM | POA: Diagnosis not present

## 2022-12-07 DIAGNOSIS — I70203 Unspecified atherosclerosis of native arteries of extremities, bilateral legs: Secondary | ICD-10-CM | POA: Diagnosis not present

## 2022-12-18 DIAGNOSIS — Z85828 Personal history of other malignant neoplasm of skin: Secondary | ICD-10-CM | POA: Diagnosis not present

## 2022-12-18 DIAGNOSIS — L304 Erythema intertrigo: Secondary | ICD-10-CM | POA: Diagnosis not present

## 2022-12-18 DIAGNOSIS — L570 Actinic keratosis: Secondary | ICD-10-CM | POA: Diagnosis not present

## 2022-12-21 ENCOUNTER — Other Ambulatory Visit: Payer: Self-pay | Admitting: Internal Medicine

## 2022-12-21 ENCOUNTER — Ambulatory Visit: Payer: Medicare HMO | Admitting: Internal Medicine

## 2022-12-21 DIAGNOSIS — E782 Mixed hyperlipidemia: Secondary | ICD-10-CM

## 2023-01-03 ENCOUNTER — Ambulatory Visit (INDEPENDENT_AMBULATORY_CARE_PROVIDER_SITE_OTHER): Payer: Medicare HMO | Admitting: Internal Medicine

## 2023-01-03 ENCOUNTER — Encounter: Payer: Self-pay | Admitting: Internal Medicine

## 2023-01-03 VITALS — BP 170/74 | HR 92 | Ht 65.0 in | Wt 200.6 lb

## 2023-01-03 DIAGNOSIS — I10 Essential (primary) hypertension: Secondary | ICD-10-CM

## 2023-01-03 DIAGNOSIS — G6289 Other specified polyneuropathies: Secondary | ICD-10-CM | POA: Diagnosis not present

## 2023-01-03 DIAGNOSIS — F5104 Psychophysiologic insomnia: Secondary | ICD-10-CM

## 2023-01-03 DIAGNOSIS — I4892 Unspecified atrial flutter: Secondary | ICD-10-CM | POA: Diagnosis not present

## 2023-01-03 DIAGNOSIS — N3281 Overactive bladder: Secondary | ICD-10-CM | POA: Diagnosis not present

## 2023-01-03 DIAGNOSIS — J42 Unspecified chronic bronchitis: Secondary | ICD-10-CM | POA: Diagnosis not present

## 2023-01-03 DIAGNOSIS — N1832 Chronic kidney disease, stage 3b: Secondary | ICD-10-CM

## 2023-01-03 DIAGNOSIS — R7303 Prediabetes: Secondary | ICD-10-CM | POA: Insufficient documentation

## 2023-01-03 MED ORDER — PREGABALIN 100 MG PO CAPS: 100.0000 mg | ORAL_CAPSULE | Freq: Two times a day (BID) | ORAL | 0 refills | Status: DC

## 2023-01-03 MED ORDER — LABETALOL HCL 200 MG PO TABS
200.0000 mg | ORAL_TABLET | Freq: Two times a day (BID) | ORAL | 1 refills | Status: DC
Start: 2023-01-03 — End: 2023-08-07

## 2023-01-03 MED ORDER — GEMTESA 75 MG PO TABS
1.0000 | ORAL_TABLET | Freq: Every day | ORAL | 3 refills | Status: DC
Start: 2023-01-03 — End: 2024-01-28

## 2023-01-03 NOTE — Assessment & Plan Note (Signed)
Was better with desmopressin 0.2 mg daily But she complains of overactive bladder and urge incontinence lately, added Gemtesa She has tried oxybutynin, Vesicare, Toviaz and Myrbetriq in the past

## 2023-01-03 NOTE — Patient Instructions (Addendum)
Please start taking Labetalol 200 mg twice daily.  Please start taking Lyrica 100 mg twice daily.  Please take Gemtesa for overactive bladder.  Please continue to take medications as prescribed.  Please continue to follow low salt diet and ambulate as tolerated.

## 2023-01-03 NOTE — Assessment & Plan Note (Addendum)
Currently in sinus rhythm Was on labetalol for rate control and HTN, restart labetalol On Eliquis, needs to take it twice daily

## 2023-01-03 NOTE — Progress Notes (Signed)
Established Patient Office Visit  Subjective:  Patient ID: ZYVA ZINS, female    DOB: 23-Jan-1951  Age: 72 y.o. MRN: [id number]  CC:  Chief Complaint  Patient presents with   Hypertension    Four month follow up    Chronic Kidney Disease    Four month follow up     HPI Paula Long is a 72 y.o. 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  HTN: BP is elevated now. Does not take medications currently.  She had stopped taking amlodipine and labetalol as her BP was staying wnl. Patient denies headache, dizziness, chest pain, dyspnea or palpitations.  A Flutter: Denies dyspnea or palpitations. Takes Eliquis regularly, although mentions having cold sensation while she takes it.  CKD stage III: She has seen nephrology.  She denies any dysuria, hematuria, urinary hesitancy or resistance. She prefers to do blood tests in our office.  She has chronic urinary incontinence, and takes desmopressin 0.2 mg nightly currently.  She has noticed recent worsening of urinary urgency, but denies any frequency,  dysuria or hematuria.  She complains of chronic low back pain and burning pain in her b/l LE.  She has chronic weakness of bilateral LE.  She was on Cymbalta for neuropathy, but it did not work for her.  Of note, she used to go to pain clinic for lumbar radiculopathy, and take Dilaudid 4 mg twice daily, but states that it did not help her pain and has stopped taking it.  Denies any recent fall or injury.  Denies any saddle anesthesia or stool incontinence. She was given Lyrica in the past, but is not effective.  COPD: She reports improvement in dyspnea upon minimal exertion.  She was given albuterol nebulizer for better compliance and easier use, but was not able to get  nebulizer machine through pharmacy.  She denies any fever or chills.  She is currently using albuterol inhaler as needed for dyspnea, but has not needed it frequently.  She still smokes 1.5 pack/day.  From previous visit: Her oxygen saturation was 91% on room air in resting conditions.  Her oxygen saturation dropped to 86% on ambulation on room air, but improved to 92% on 2 L O2.   Past Medical History:  Diagnosis Date   Anxiety    Arthritis 12-07-11   arthritis,DDD,spinal stenosis   Chronic back pain    COPD (chronic obstructive pulmonary disease) (HCC) 12-07-11   pt. uses nebulizer as needed and inhaler daily   Fibromyalgia    skin cancer   GERD (gastroesophageal reflux disease)    Hypertension 12-07-11   presently no meds   Normal cardiac stress test 06/2009   Skin cancer    melanoma.  face 2014   TIA (transient ischemic attack) ?    Past Surgical History:  Procedure Laterality Date   ABDOMINAL HYSTERECTOMY     BACK SURGERY  12-07-11   2'10 L5 x2   COLONOSCOPY WITH PROPOFOL N/A 06/03/2014   Procedure: COLONOSCOPY WITH PROPOFOL; IN CECUM AT 0750; TOTAL WITHDRAWAL TIME 16 MINUTES;  Surgeon: Malissa Hippo, MD;  Location: [facility];  Service: Endoscopy;  Laterality: N/A;   left ankle surgery     tendonitis   LUMBAR LAMINECTOMY/DECOMPRESSION MICRODISCECTOMY  12/11/2011   Procedure: LUMBAR LAMINECTOMY/DECOMPRESSION MICRODISCECTOMY;  Surgeon: Windy Fast  Sanjuana Kava, MD;  Location: [facility];  Service: Orthopedics;  Laterality: Left;  Decompressive Lumbar Laminectomy L5-S1 on Left   POLYPECTOMY N/A 06/03/2014   Procedure: POLYPECTOMY;  Surgeon: Malissa Hippo, MD;  Location: [facility];  Service: Endoscopy;  Laterality: N/A;   right wrist surgery for pinched nerve     trigger finger surgery  12-07-11   rt. middle trigger finger release    Family History  Problem Relation Age of Onset   Cancer Mother        uterine   Cancer Father        lung    Social History   Socioeconomic History    Marital status: Single    Spouse name: Not on file   Number of children: Not on file   Years of education: Not on file   Highest education level: Not on file  Occupational History   Not on file  Tobacco Use   Smoking status: Every Day    Current packs/day: 1.50    Average packs/day: 1.5 packs/day for 53.0 years (79.5 ttl pk-yrs)    Types: Cigarettes    Start date: 01/07/1970   Smokeless tobacco: Never   Tobacco comments:    gone down to 1 pack per day   Vaping Use   Vaping status: Never Used  Substance and Sexual Activity   Alcohol use: No    Alcohol/week: 0.0 standard drinks of alcohol   Drug use: No   Sexual activity: Yes    Birth control/protection: Surgical  Other Topics Concern   Not on file  Social History Narrative   Not on file   Social Determinants of Health   Financial Resource Strain: Low Risk  (03/21/2022)   Overall Financial Resource Strain (CARDIA)    Difficulty of Paying Living Expenses: Not hard at all  Food Insecurity: No Food Insecurity (10/17/2021)   Hunger Vital Sign    Worried About Running Out of Food in the Last Year: Never true    Ran Out of Food in the Last Year: Never true  Transportation Needs: No Transportation Needs (03/21/2022)   PRAPARE - Administrator, Civil Service (Medical): No    Lack of Transportation (Non-Medical): No  Physical Activity: Inactive (10/17/2021)   Exercise Vital Sign    Days of Exercise per Week: 0 days    Minutes of Exercise per Session: 0 min  Stress: No Stress Concern Present (10/17/2021)   [employer] of Occupational Health - Occupational Stress Questionnaire    Feeling of Stress : Not at all  Social Connections: Moderately Isolated (10/17/2021)   Social Connection and Isolation Panel [NHANES]    Frequency of Communication with Friends and Family: More than three times a week    Frequency of Social Gatherings with Friends and Family: More than three times a week    Attends Religious Services: Never     Database administrator or Organizations: No    Attends Banker Meetings: Never    Marital Status: Living with partner  Intimate Partner Violence: Not At Risk (10/17/2021)   Humiliation, Afraid, Rape, and Kick questionnaire    Fear of Current or Ex-Partner: No    Emotionally Abused: No    Physically Abused: No    Sexually Abused: No    Outpatient Medications Prior to Visit  Medication Sig Dispense Refill   albuterol (VENTOLIN HFA) 108 (90 Base) MCG/ACT inhaler Inhale 2 puffs into the lungs every 6 (six) hours as needed  for wheezing or shortness of breath. 18 g 2   apixaban (ELIQUIS) 5 MG TABS tablet Take 1 tablet (5 mg total) by mouth 2 (two) times daily. 60 tablet 11   atorvastatin (LIPITOR) 10 MG tablet TAKE ONE TABLET BY MOUTH EVERY DAY 90 tablet 0   desmopressin (DDAVP) 0.2 MG tablet Take 1 tablet (0.2 mg total) by mouth at bedtime. 90 tablet 3   dexlansoprazole (DEXILANT) 60 MG capsule Take 1 capsule (60 mg total) by mouth daily. 30 capsule 5   EASYMAX TEST test strip USE TO TEST ONCE DAILY. 100 each 3   Fluocinolone Acetonide Body (DERMA-SMOOTHE/FS BODY) 0.01 % OIL Apply to scalp daily a three times a week (Patient not taking: Reported on 03/16/2022) 120 mL 1   guaiFENesin-codeine (CHERATUSSIN AC) 100-10 MG/5ML syrup Take 5 mLs by mouth 3 (three) times daily as needed for cough. 120 mL 0   ipratropium-albuterol (DUONEB) 0.5-2.5 (3) MG/3ML SOLN Take 3 mLs by nebulization every 6 (six) hours as needed. 360 mL 5   traZODone (DESYREL) 150 MG tablet Take 1 tablet (150 mg total) by mouth at bedtime. 90 tablet 1   labetalol (NORMODYNE) 200 MG tablet Take 1 tablet (200 mg total) by mouth 2 (two) times daily. 180 tablet 1   pregabalin (LYRICA) 75 MG capsule Take 1 capsule (75 mg total) by mouth 2 (two) times daily. 60 capsule 3   No facility-administered medications prior to visit.    Allergies  Allergen Reactions   Hydrocodone-Acetaminophen    Oxycodone-Acetaminophen     Sulfa Antibiotics    Nystatin Rash    Oral rash   Percocet [Oxycodone-Acetaminophen] Rash    Oral rash   Vesicare [Solifenacin] Rash   Vicodin [Hydrocodone-Acetaminophen] Rash    Oral rash    ROS Review of Systems  Constitutional:  Negative for chills and fever.  HENT:  Negative for congestion, sinus pressure, sinus pain and sore throat.   Eyes:  Negative for pain and discharge.  Respiratory:  Positive for cough (Chronic) and shortness of breath.   Cardiovascular:  Negative for chest pain and palpitations.  Gastrointestinal:  Negative for abdominal pain, diarrhea, nausea and vomiting.  Endocrine: Negative for polydipsia and polyuria.  Genitourinary:  Negative for dysuria and hematuria.  Musculoskeletal:  Positive for arthralgias and back pain. Negative for neck pain and neck stiffness.  Skin:  Negative for wound.  Neurological:  Positive for weakness. Negative for dizziness, syncope and headaches.  Psychiatric/Behavioral:  Positive for sleep disturbance. Negative for agitation and behavioral problems. The patient is not nervous/anxious.       Objective:    Physical Exam Vitals reviewed.  Constitutional:      General: She is not in acute distress.    Appearance: She is not diaphoretic.  HENT:     Head: Normocephalic and atraumatic.     Nose: Nose normal.     Mouth/Throat:     Mouth: Mucous membranes are moist.  Eyes:     General: No scleral icterus.    Extraocular Movements: Extraocular movements intact.  Cardiovascular:     Rate and Rhythm: Normal rate and regular rhythm.     Pulses: Normal pulses.     Heart sounds: Murmur (Systolic, right upper sternal border) heard.  Pulmonary:     Breath sounds: No wheezing or rales.  Abdominal:     Palpations: Abdomen is soft.     Tenderness: There is no abdominal tenderness.  Musculoskeletal:        General:  Tenderness (Lumbar spinal area) present.     Cervical back: Neck supple. No tenderness.     Right lower leg: No edema.      Left lower leg: No edema.  Skin:    General: Skin is warm and dry.  Neurological:     General: No focal deficit present.     Mental Status: She is alert and oriented to person, place, and time.     Sensory: No sensory deficit.     Motor: Weakness (3/5 in b/l LE, 4/5 in b/l UE) present.     Gait: Gait abnormal (Slow, unsteady).  Psychiatric:        Mood and Affect: Mood normal.        Behavior: Behavior normal.     BP (!) 170/74 (BP Location: Right Arm)   Pulse 92   Ht 5\' 5"  (1.651 m)   Wt 200 lb 9.6 oz (91 kg)   SpO2 94%   BMI 33.38 kg/m  Wt Readings from Last 3 Encounters:  01/03/23 200 lb 9.6 oz (91 kg)  08/21/22 200 lb (90.7 kg)  04/20/22 205 lb 12.8 oz (93.4 kg)    Lab Results  Component Value Date   TSH 2.180 08/21/2022   Lab Results  Component Value Date   WBC 8.5 08/21/2022   HGB 13.0 08/21/2022   HCT 39.3 08/21/2022   MCV 95 08/21/2022   PLT 242 08/21/2022   Lab Results  Component Value Date   NA 141 08/21/2022   K 4.5 08/21/2022   CO2 24 08/21/2022   GLUCOSE 91 08/21/2022   BUN 21 08/21/2022   CREATININE 1.82 (H) 08/21/2022   BILITOT <0.2 08/21/2022   ALKPHOS 76 08/21/2022   AST 13 08/21/2022   ALT 11 08/21/2022   PROT 6.7 08/21/2022   ALBUMIN 4.0 08/21/2022   CALCIUM 9.2 08/21/2022   ANIONGAP 5 03/18/2022   EGFR 30 (L) 08/21/2022   Lab Results  Component Value Date   CHOL 99 (L) 08/21/2022   Lab Results  Component Value Date   HDL 62 08/21/2022   Lab Results  Component Value Date   LDLCALC 26 08/21/2022   Lab Results  Component Value Date   TRIG 40 08/21/2022   Lab Results  Component Value Date   CHOLHDL 1.6 08/21/2022   Lab Results  Component Value Date   HGBA1C 6.0 (H) 08/21/2022      Assessment & Plan:   Problem List Items Addressed This Visit       Cardiovascular and Mediastinum   Essential hypertension    BP Readings from Last 1 Encounters:  01/03/23 (!) 170/74   Uncontrolled Was on Amlodipine 10 mg QD  and Labetalol 200 mg BID - advised to restart Labetalol 200 mg BID for now and reassess in 2 weeks, if persistently elevated, can add amlodipine Needs to improve hydration Counseled for compliance with the medications Advised DASH diet and moderate exercise/walking as tolerated Follows up with Cardiologist      Relevant Medications   labetalol (NORMODYNE) 200 MG tablet   Paroxysmal atrial flutter (HCC)    Currently in sinus rhythm Was on labetalol for rate control and HTN, restart labetalol On Eliquis, needs to take it twice daily      Relevant Medications   labetalol (NORMODYNE) 200 MG tablet     Respiratory   COPD (chronic obstructive pulmonary disease) (HCC)    Well-controlled with Albuterol PRN Would prefer to add maintenance inhaler, but were not cost effective  Nervous and Auditory   Peripheral neuropathy    Has lumbar radiculopathy as well Has tried Cymbalta with no relief Had tried Lyrica 75 mg BID, but not optimally effective Increased dose of Lyrica to 100 mg twice daily Does not prefer any Neurology referral as she cannot drive to [facility]      Relevant Medications   pregabalin (LYRICA) 100 MG capsule     Genitourinary   OAB (overactive bladder)    Was better with desmopressin 0.2 mg daily But she complains of overactive bladder and urge incontinence lately, added Gemtesa She has tried oxybutynin, Vesicare, Toviaz and Myrbetriq in the past      Relevant Medications   Vibegron (GEMTESA) 75 MG TABS   CKD (chronic kidney disease) stage 3, GFR 30-59 ml/min (HCC) - Primary    CKD likely due to h/o uncontrolled HTN Avoid nephrotoxic agents including NSAIDs Followed by nephrology Check BMP and urine protein/creatinine ratio      Relevant Orders   CBC with Differential/Platelet   CMP14+EGFR   Protein / creatinine ratio, urine     Other   Chronic insomnia    On Trazodone 150 mg QD      Prediabetes    Lab Results  Component Value Date    HGBA1C 6.0 (H) 08/21/2022   Advised to follow DASH diet      Relevant Orders   Hemoglobin A1c    Meds ordered this encounter  Medications   labetalol (NORMODYNE) 200 MG tablet    Sig: Take 1 tablet (200 mg total) by mouth 2 (two) times daily.    Dispense:  180 tablet    Refill:  1   pregabalin (LYRICA) 100 MG capsule    Sig: Take 1 capsule (100 mg total) by mouth 2 (two) times daily.    Dispense:  30 capsule    Refill:  0   Vibegron (GEMTESA) 75 MG TABS    Sig: Take 1 tablet (75 mg total) by mouth daily.    Dispense:  30 tablet    Refill:  3    Follow-up: Return in about 2 weeks (around 01/17/2023) for HTN and neuropathy.    Anabel Halon, MD

## 2023-01-03 NOTE — Assessment & Plan Note (Signed)
CKD likely due to h/o uncontrolled HTN Avoid nephrotoxic agents including NSAIDs Followed by nephrology Check BMP and urine protein/creatinine ratio

## 2023-01-03 NOTE — Assessment & Plan Note (Addendum)
Lab Results  Component Value Date   HGBA1C 6.0 (H) 08/21/2022   Advised to follow DASH diet

## 2023-01-03 NOTE — Assessment & Plan Note (Addendum)
BP Readings from Last 1 Encounters:  01/03/23 (!) 170/74   Uncontrolled Was on Amlodipine 10 mg QD and Labetalol 200 mg BID - advised to restart Labetalol 200 mg BID for now and reassess in 2 weeks, if persistently elevated, can add amlodipine Needs to improve hydration Counseled for compliance with the medications Advised DASH diet and moderate exercise/walking as tolerated Follows up with Cardiologist

## 2023-01-03 NOTE — Assessment & Plan Note (Signed)
On Trazodone 150 mg QD

## 2023-01-03 NOTE — Assessment & Plan Note (Addendum)
Has lumbar radiculopathy as well Has tried Cymbalta with no relief Had tried Lyrica 75 mg BID, but not optimally effective Increased dose of Lyrica to 100 mg twice daily Does not prefer any Neurology referral as she cannot drive to [facility]

## 2023-01-03 NOTE — Assessment & Plan Note (Addendum)
Well-controlled with Albuterol PRN Would prefer to add maintenance inhaler, but were not cost effective

## 2023-01-05 ENCOUNTER — Other Ambulatory Visit: Payer: Self-pay | Admitting: Internal Medicine

## 2023-01-05 DIAGNOSIS — N2581 Secondary hyperparathyroidism of renal origin: Secondary | ICD-10-CM | POA: Diagnosis not present

## 2023-01-05 DIAGNOSIS — F5104 Psychophysiologic insomnia: Secondary | ICD-10-CM

## 2023-01-05 DIAGNOSIS — N1832 Chronic kidney disease, stage 3b: Secondary | ICD-10-CM | POA: Diagnosis not present

## 2023-01-05 DIAGNOSIS — E559 Vitamin D deficiency, unspecified: Secondary | ICD-10-CM | POA: Diagnosis not present

## 2023-01-17 ENCOUNTER — Ambulatory Visit: Payer: Medicare HMO | Admitting: Internal Medicine

## 2023-01-18 ENCOUNTER — Ambulatory Visit (INDEPENDENT_AMBULATORY_CARE_PROVIDER_SITE_OTHER): Payer: Medicare HMO | Admitting: Internal Medicine

## 2023-01-18 ENCOUNTER — Encounter: Payer: Self-pay | Admitting: Internal Medicine

## 2023-01-18 VITALS — BP 122/57 | HR 63 | Ht 65.0 in | Wt 200.0 lb

## 2023-01-18 DIAGNOSIS — J42 Unspecified chronic bronchitis: Secondary | ICD-10-CM | POA: Diagnosis not present

## 2023-01-18 DIAGNOSIS — N1832 Chronic kidney disease, stage 3b: Secondary | ICD-10-CM | POA: Diagnosis not present

## 2023-01-18 DIAGNOSIS — M48062 Spinal stenosis, lumbar region with neurogenic claudication: Secondary | ICD-10-CM

## 2023-01-18 DIAGNOSIS — R042 Hemoptysis: Secondary | ICD-10-CM

## 2023-01-18 MED ORDER — GUAIFENESIN-CODEINE 100-10 MG/5ML PO SYRP: 5.0000 mL | ORAL_SOLUTION | Freq: Three times a day (TID) | ORAL | 0 refills | Status: DC | PRN

## 2023-01-18 MED ORDER — OXYCODONE-ACETAMINOPHEN 5-325 MG PO TABS
1.0000 | ORAL_TABLET | Freq: Four times a day (QID) | ORAL | 0 refills | Status: DC | PRN
Start: 2023-01-18 — End: 2023-05-08

## 2023-01-18 NOTE — Assessment & Plan Note (Addendum)
Usually well-controlled with Albuterol PRN Would prefer to add maintenance inhaler, but were not cost effective Cheratussin as needed for cough

## 2023-01-18 NOTE — Assessment & Plan Note (Signed)
CKD likely due to h/o uncontrolled HTN Avoid nephrotoxic agents including NSAIDs Followed by nephrology Check CBC and BMP

## 2023-01-18 NOTE — Progress Notes (Signed)
Acute Office Visit  Subjective:    Patient ID: Paula Long, female    DOB: 07/14/1950, 72 y.o.   MRN: [id number]  Chief Complaint  Patient presents with   spitting up blood    Spitting up blood since 10/19   Back Pain    Back pain     HPI Patient is in today for complaint of hemoptysis since 01/06/23.  She had a teaspoon full blood with cough on the first day, followed by blood mixed with sputum since then.  Denies any fever or chills.  She has longstanding smoking history and COPD.  Denies any LAD or night sweats.  Her weight has been stable lately.  She takes Eliquis for history of A flutter.  She uses albuterol inhaler and DuoNeb for COPD.  Denies any chest pain or palpitations currently.  She also reports recent worsening of low back pain.  Her pain is constant, worse with movement, radiating to bilateral LE and is associated with bilateral leg weakness and numbness.  Her pain is better upon bending.  She used to take Dilaudid for it, but has been off of it since 2023.  She was recently given Lyrica, but states that it did not help her pain.  Past Medical History:  Diagnosis Date   Anxiety    Arthritis 12-07-11   arthritis,DDD,spinal stenosis   Chronic back pain    COPD (chronic obstructive pulmonary disease) (HCC) 12-07-11   pt. uses nebulizer as needed and inhaler daily   Fibromyalgia    skin cancer   GERD (gastroesophageal reflux disease)    Hypertension 12-07-11   presently no meds   Normal cardiac stress test 06/2009   Skin cancer    melanoma.  face 2014   TIA (transient ischemic attack) ?    Past Surgical History:  Procedure Laterality Date   ABDOMINAL HYSTERECTOMY     BACK SURGERY  12-07-11   2'10 L5 x2   COLONOSCOPY WITH PROPOFOL N/A 06/03/2014   Procedure: COLONOSCOPY WITH PROPOFOL; IN CECUM AT 0750; TOTAL WITHDRAWAL TIME 16 MINUTES;  Surgeon: Malissa Hippo, MD;  Location: [facility];  Service: Endoscopy;  Laterality: N/A;   left ankle surgery     tendonitis    LUMBAR LAMINECTOMY/DECOMPRESSION MICRODISCECTOMY  12/11/2011   Procedure: LUMBAR LAMINECTOMY/DECOMPRESSION MICRODISCECTOMY;  Surgeon: Jacki Cones, MD;  Location: [facility];  Service: Orthopedics;  Laterality: Left;  Decompressive Lumbar Laminectomy L5-S1 on Left   POLYPECTOMY N/A 06/03/2014   Procedure: POLYPECTOMY;  Surgeon: Malissa Hippo, MD;  Location: [facility];  Service: Endoscopy;  Laterality: N/A;   right wrist surgery for pinched nerve     trigger finger surgery  12-07-11   rt. middle trigger finger release    Family History  Problem Relation Age of Onset   Cancer Mother        uterine   Cancer Father        lung    Social History   Socioeconomic History   Marital status: Single    Spouse name: Not on file   Number of children: Not on file   Years of education: Not on file   Highest education level: Not on file  Occupational History   Not on file  Tobacco Use   Smoking status: Every Day    Current packs/day: 1.50    Average packs/day: 1.5 packs/day for 53.0 years (79.5 ttl pk-yrs)    Types: Cigarettes    Start date: 01/07/1970   Smokeless tobacco:  Never   Tobacco comments:    gone down to 1 pack per day   Vaping Use   Vaping status: Never Used  Substance and Sexual Activity   Alcohol use: No    Alcohol/week: 0.0 standard drinks of alcohol   Drug use: No   Sexual activity: Yes    Birth control/protection: Surgical  Other Topics Concern   Not on file  Social History Narrative   Not on file   Social Determinants of Health   Financial Resource Strain: Low Risk  (03/21/2022)   Overall Financial Resource Strain (CARDIA)    Difficulty of Paying Living Expenses: Not hard at all  Food Insecurity: No Food Insecurity (10/17/2021)   Hunger Vital Sign    Worried About Running Out of Food in the Last Year: Never true    Ran Out of Food in the Last Year: Never true  Transportation Needs: No Transportation Needs (03/21/2022)   PRAPARE - Scientist, research (physical sciences) (Medical): No    Lack of Transportation (Non-Medical): No  Physical Activity: Inactive (10/17/2021)   Exercise Vital Sign    Days of Exercise per Week: 0 days    Minutes of Exercise per Session: 0 min  Stress: No Stress Concern Present (10/17/2021)   [employer] of Occupational Health - Occupational Stress Questionnaire    Feeling of Stress : Not at all  Social Connections: Moderately Isolated (10/17/2021)   Social Connection and Isolation Panel [NHANES]    Frequency of Communication with Friends and Family: More than three times a week    Frequency of Social Gatherings with Friends and Family: More than three times a week    Attends Religious Services: Never    Database administrator or Organizations: No    Attends Banker Meetings: Never    Marital Status: Living with partner  Intimate Partner Violence: Not At Risk (10/17/2021)   Humiliation, Afraid, Rape, and Kick questionnaire    Fear of Current or Ex-Partner: No    Emotionally Abused: No    Physically Abused: No    Sexually Abused: No    Outpatient Medications Prior to Visit  Medication Sig Dispense Refill   albuterol (VENTOLIN HFA) 108 (90 Base) MCG/ACT inhaler Inhale 2 puffs into the lungs every 6 (six) hours as needed for wheezing or shortness of breath. 18 g 2   apixaban (ELIQUIS) 5 MG TABS tablet Take 1 tablet (5 mg total) by mouth 2 (two) times daily. 60 tablet 11   atorvastatin (LIPITOR) 10 MG tablet TAKE ONE TABLET BY MOUTH EVERY DAY 90 tablet 0   desmopressin (DDAVP) 0.2 MG tablet Take 1 tablet (0.2 mg total) by mouth at bedtime. 90 tablet 3   dexlansoprazole (DEXILANT) 60 MG capsule Take 1 capsule (60 mg total) by mouth daily. 30 capsule 5   EASYMAX TEST test strip USE TO TEST ONCE DAILY. 100 each 3   Fluocinolone Acetonide Body (DERMA-SMOOTHE/FS BODY) 0.01 % OIL Apply to scalp daily a three times a week (Patient not taking: Reported on 03/16/2022) 120 mL 1   ipratropium-albuterol  (DUONEB) 0.5-2.5 (3) MG/3ML SOLN Take 3 mLs by nebulization every 6 (six) hours as needed. 360 mL 5   labetalol (NORMODYNE) 200 MG tablet Take 1 tablet (200 mg total) by mouth 2 (two) times daily. 180 tablet 1   pregabalin (LYRICA) 100 MG capsule Take 1 capsule (100 mg total) by mouth 2 (two) times daily. 30 capsule 0   traZODone (DESYREL) 150  MG tablet TAKE (1) TABLET BY MOUTH AT BEDTIME. 90 tablet 1   Vibegron (GEMTESA) 75 MG TABS Take 1 tablet (75 mg total) by mouth daily. 30 tablet 3   guaiFENesin-codeine (CHERATUSSIN AC) 100-10 MG/5ML syrup Take 5 mLs by mouth 3 (three) times daily as needed for cough. 120 mL 0   No facility-administered medications prior to visit.    Allergies  Allergen Reactions   Hydrocodone-Acetaminophen    Oxycodone-Acetaminophen    Sulfa Antibiotics    Nystatin Rash    Oral rash   Percocet [Oxycodone-Acetaminophen] Rash    Oral rash   Vesicare [Solifenacin] Rash   Vicodin [Hydrocodone-Acetaminophen] Rash    Oral rash    Review of Systems  Constitutional:  Negative for chills and fever.  HENT:  Negative for congestion, sinus pressure, sinus pain and sore throat.   Eyes:  Negative for pain and discharge.  Respiratory:  Positive for cough (Chronic) and shortness of breath.   Cardiovascular:  Negative for chest pain and palpitations.  Gastrointestinal:  Negative for abdominal pain, diarrhea, nausea and vomiting.  Endocrine: Negative for polydipsia and polyuria.  Genitourinary:  Negative for dysuria and hematuria.  Musculoskeletal:  Positive for arthralgias and back pain. Negative for neck pain and neck stiffness.  Skin:  Negative for wound.  Neurological:  Positive for weakness. Negative for dizziness, syncope and headaches.  Psychiatric/Behavioral:  Positive for sleep disturbance. Negative for agitation and behavioral problems. The patient is not nervous/anxious.        Objective:    Physical Exam Vitals reviewed.  Constitutional:      General:  She is not in acute distress.    Appearance: She is not diaphoretic.  HENT:     Head: Normocephalic and atraumatic.     Nose: Nose normal.     Mouth/Throat:     Mouth: Mucous membranes are moist.  Eyes:     General: No scleral icterus.    Extraocular Movements: Extraocular movements intact.  Cardiovascular:     Rate and Rhythm: Normal rate and regular rhythm.     Pulses: Normal pulses.     Heart sounds: Murmur (Systolic, right upper sternal border) heard.  Pulmonary:     Breath sounds: No wheezing or rales.  Abdominal:     Palpations: Abdomen is soft.     Tenderness: There is no abdominal tenderness.  Musculoskeletal:        General: Tenderness (Lumbar spinal area) present.     Cervical back: Neck supple. No tenderness.     Right lower leg: No edema.     Left lower leg: No edema.  Skin:    General: Skin is warm and dry.  Neurological:     General: No focal deficit present.     Mental Status: She is alert and oriented to person, place, and time.     Sensory: No sensory deficit.     Motor: Weakness (3/5 in b/l LE, 4/5 in b/l UE) present.     Gait: Gait abnormal (Slow, unsteady).  Psychiatric:        Mood and Affect: Mood normal.        Behavior: Behavior normal.     BP (!) 122/57 (BP Location: Left Arm, Patient Position: Sitting, Cuff Size: Normal)   Pulse 63   Ht 5\' 5"  (1.651 m)   Wt 200 lb (90.7 kg)   SpO2 93%   BMI 33.28 kg/m  Wt Readings from Last 3 Encounters:  01/18/23 200 lb (90.7 kg)  01/03/23 200 lb 9.6 oz (91 kg)  08/21/22 200 lb (90.7 kg)        Assessment & Plan:   Problem List Items Addressed This Visit       Respiratory   COPD (chronic obstructive pulmonary disease) (HCC)    Usually well-controlled with Albuterol PRN Would prefer to add maintenance inhaler, but were not cost effective Cheratussin as needed for cough       Relevant Medications   guaiFENesin-codeine (ROBITUSSIN AC) 100-10 MG/5ML syrup     Genitourinary   CKD (chronic  kidney disease) stage 3, GFR 30-59 ml/min (HCC)    CKD likely due to h/o uncontrolled HTN Avoid nephrotoxic agents including NSAIDs Followed by nephrology Check CBC and BMP      Relevant Orders   CBC with Differential/Platelet   Basic Metabolic Panel (BMET)     Other   Spinal stenosis, lumbar region, with neurogenic claudication (Chronic)    S/p lumbar spine surgeries Has chronic low back pain, used to see [facility] Neurology and later [facility] pain clinic Has stopped opioid medications Restarted Lyrica, increased dose to 100 mg BID Due to persistent low back pain, started Percocet 5-325 mg q6h PRN      Relevant Medications   oxyCODONE-acetaminophen (PERCOCET/ROXICET) 5-325 MG tablet   Hemoptysis - Primary    Hemoptysis concerning for suspicious lung nodules considering her history of COPD and current smoking Check CT chest stat Cheratussin as needed for cough Check CBC      Relevant Orders   CT Chest Wo Contrast   CBC with Differential/Platelet     Meds ordered this encounter  Medications   oxyCODONE-acetaminophen (PERCOCET/ROXICET) 5-325 MG tablet    Sig: Take 1 tablet by mouth every 6 (six) hours as needed for severe pain (pain score 7-10).    Dispense:  30 tablet    Refill:  0   guaiFENesin-codeine (ROBITUSSIN AC) 100-10 MG/5ML syrup    Sig: Take 5 mLs by mouth 3 (three) times daily as needed.    Dispense:  120 mL    Refill:  0     Ledora Delker Concha Se, MD

## 2023-01-18 NOTE — Assessment & Plan Note (Addendum)
S/p lumbar spine surgeries Has chronic low back pain, used to see [facility] Neurology and later [facility] pain clinic Has stopped opioid medications Restarted Lyrica, increased dose to 100 mg BID Due to persistent low back pain, started Percocet 5-325 mg q6h PRN

## 2023-01-18 NOTE — Assessment & Plan Note (Signed)
Hemoptysis concerning for suspicious lung nodules considering her history of COPD and current smoking Check CT chest stat Cheratussin as needed for cough Check CBC

## 2023-01-18 NOTE — Patient Instructions (Signed)
Please get CT chest done at [facility].  Please take Percocet as needed for severe back pain.

## 2023-01-19 ENCOUNTER — Ambulatory Visit (HOSPITAL_COMMUNITY)
Admission: RE | Admit: 2023-01-19 | Discharge: 2023-01-19 | Disposition: A | Discharge status: Home / Self Care | Payer: Medicare HMO | Source: Ambulatory Visit | Attending: Internal Medicine | Admitting: Internal Medicine

## 2023-01-19 DIAGNOSIS — R042 Hemoptysis: Secondary | ICD-10-CM | POA: Diagnosis not present

## 2023-01-19 DIAGNOSIS — R918 Other nonspecific abnormal finding of lung field: Secondary | ICD-10-CM | POA: Diagnosis not present

## 2023-01-19 LAB — BASIC METABOLIC PANEL
BUN/Creatinine Ratio: 14 (ref 12–28)
BUN: 22 mg/dL (ref 8–27)
CO2: 28 mmol/L (ref 20–29)
Calcium: 8.8 mg/dL (ref 8.7–10.3)
Chloride: 103 mmol/L (ref 96–106)
Creatinine, Ser: 1.55 mg/dL — ABNORMAL HIGH (ref 0.57–1.00)
Glucose: 85 mg/dL (ref 70–99)
Potassium: 4.7 mmol/L (ref 3.5–5.2)
Sodium: 143 mmol/L (ref 134–144)
eGFR: 36 mL/min/{1.73_m2} — ABNORMAL LOW (ref >59)

## 2023-01-19 LAB — CBC WITH DIFFERENTIAL/PLATELET
Basophils Absolute: 0.1 10*3/uL (ref 0.0–0.2)
Basos: 1 %
EOS (ABSOLUTE): 0.1 10*3/uL (ref 0.0–0.4)
Eos: 2 %
Hematocrit: 38.1 % (ref 34.0–46.6)
Hemoglobin: 11.8 g/dL (ref 11.1–15.9)
Immature Grans (Abs): 0 10*3/uL (ref 0.0–0.1)
Immature Granulocytes: 0 %
Lymphocytes Absolute: 1.6 10*3/uL (ref 0.7–3.1)
Lymphs: 21 %
MCH: 30.6 pg (ref 26.6–33.0)
MCHC: 31 g/dL — ABNORMAL LOW (ref 31.5–35.7)
MCV: 99 fL — ABNORMAL HIGH (ref 79–97)
Monocytes Absolute: 0.5 10*3/uL (ref 0.1–0.9)
Monocytes: 7 %
Neutrophils Absolute: 5.2 10*3/uL (ref 1.4–7.0)
Neutrophils: 69 %
Platelets: 228 10*3/uL (ref 150–450)
RBC: 3.86 x10E6/uL (ref 3.77–5.28)
RDW: 13 % (ref 11.7–15.4)
WBC: 7.6 10*3/uL (ref 3.4–10.8)

## 2023-01-22 ENCOUNTER — Telehealth: Payer: Self-pay | Admitting: Internal Medicine

## 2023-01-22 ENCOUNTER — Other Ambulatory Visit: Payer: Self-pay | Admitting: Internal Medicine

## 2023-01-22 DIAGNOSIS — J189 Pneumonia, unspecified organism: Secondary | ICD-10-CM

## 2023-01-22 MED ORDER — AMOXICILLIN-POT CLAVULANATE 875-125 MG PO TABS
1.0000 | ORAL_TABLET | Freq: Two times a day (BID) | ORAL | 0 refills | Status: DC
Start: 2023-01-22 — End: 2023-01-25

## 2023-01-22 NOTE — Telephone Encounter (Signed)
Spoke to patient

## 2023-01-22 NOTE — Telephone Encounter (Signed)
Pt returning call to office

## 2023-01-25 ENCOUNTER — Other Ambulatory Visit: Payer: Self-pay | Admitting: Internal Medicine

## 2023-01-25 ENCOUNTER — Telehealth: Payer: Self-pay | Admitting: Internal Medicine

## 2023-01-25 ENCOUNTER — Telehealth: Payer: Self-pay

## 2023-01-25 ENCOUNTER — Ambulatory Visit: Payer: Self-pay | Admitting: Internal Medicine

## 2023-01-25 DIAGNOSIS — J189 Pneumonia, unspecified organism: Secondary | ICD-10-CM

## 2023-01-25 MED ORDER — LEVOFLOXACIN 750 MG PO TABS
750.0000 mg | ORAL_TABLET | Freq: Every day | ORAL | 0 refills | Status: DC
Start: 2023-01-25 — End: 2023-02-13

## 2023-01-25 NOTE — Telephone Encounter (Signed)
Chief Complaint: Fall this morning; Rash in mouth Symptoms: Rib pain Frequency: none Pertinent Negatives: Patient denies chest pain or shortness of breath Disposition: [] ED /[] Urgent Care (no appt availability in office) / [x] Appointment(In office/virtual)/ []  [facility]/ [] Home Care/ [] Refused Recommended Disposition /[] [place] [facility]/ [x]  Follow-up with PCP  Additional Notes: Pt reports fall this morning, landed on buttocks, reports rib pain. Refuses to go to ED. Advised to see PCP, next avail is 11/14, declines appointment.     Copied from CRM [id number]. Topic: Clinical - Red Word Triage >> Jan 25, 2023  2:09 PM Deaijah H wrote: Red Word that prompted transfer to Nurse Triage: Fall Answer Assessment - Initial Assessment Questions 1. MECHANISM: "How did the fall happen?"     Fell this morning in bathroom, sts legs just gave out  2. DOMESTIC VIOLENCE AND ELDER ABUSE SCREENING: "Did you fall because someone pushed you or tried to hurt you?" If Yes, ask: "Are you safe now?"     No  3. ONSET: "When did the fall happen?" (e.g., minutes, hours, or days ago)     This morning around 7am  4. LOCATION: "What part of the body hit the ground?" (e.g., back, buttocks, head, hips, knees, hands, head, stomach)     Buttocks, between tube and toilet  5. INJURY: "Did you hurt (injure) yourself when you fell?" If Yes, ask: "What did you injure? Tell me more about this?" (e.g., body area; type of injury; pain severity)"     Yes, expresses rib pain  6. PAIN: "Is there any pain?" If Yes, ask: "How bad is the pain?" (e.g., Scale 1-10; or mild,  moderate, severe)   - NONE (0): No pain   - MILD (1-3): Doesn't interfere with normal activities    - MODERATE (4-7): Interferes with normal activities or awakens from sleep    - SEVERE (8-10): Excruciating pain, unable to do any normal activities      9  7. SIZE: For cuts, bruises, or swelling, ask: "How large is it?" (e.g., inches or  centimeters)      No  8. PREGNANCY: "Is there any chance you are pregnant?" "When was your last menstrual period?"     No  9. OTHER SYMPTOMS: "Do you have any other symptoms?" (e.g., dizziness, fever, weakness; new onset or worsening).      dizziness 10. CAUSE: "What do you think caused the fall (or falling)?" (e.g., tripped, dizzy spell)       Weakness,sts legs just gave out  Protocols used: Falls and Falling-A-AH  Reason for Disposition  [1] MODERATE weakness (i.e., interferes with work, school, normal activities) AND [2] new-onset or worsening  Answer Assessment - Initial Assessment Questions 1. MECHANISM: "How did the fall happen?"     Fell this morning in bathroom, sts legs just gave out  2. DOMESTIC VIOLENCE AND ELDER ABUSE SCREENING: "Did you fall because someone pushed you or tried to hurt you?" If Yes, ask: "Are you safe now?"     No  3. ONSET: "When did the fall happen?" (e.g., minutes, hours, or days ago)     This morning around 7am  4. LOCATION: "What part of the body hit the ground?" (e.g., back, buttocks, head, hips, knees, hands, head, stomach)     Buttocks, between tube and toilet  5. INJURY: "Did you hurt (injure) yourself when you fell?" If Yes, ask: "What did you injure? Tell me more about this?" (e.g., body area; type of injury; pain  severity)"     Yes, expresses rib pain  6. PAIN: "Is there any pain?" If Yes, ask: "How bad is the pain?" (e.g., Scale 1-10; or mild,  moderate, severe)   - NONE (0): No pain   - MILD (1-3): Doesn't interfere with normal activities    - MODERATE (4-7): Interferes with normal activities or awakens from sleep    - SEVERE (8-10): Excruciating pain, unable to do any normal activities      9  7. SIZE: For cuts, bruises, or swelling, ask: "How large is it?" (e.g., inches or centimeters)      No  8. PREGNANCY: "Is there any chance you are pregnant?" "When was your last menstrual period?"     No  9. OTHER SYMPTOMS: "Do you have  any other symptoms?" (e.g., dizziness, fever, weakness; new onset or worsening).      dizziness 10. CAUSE: "What do you think caused the fall (or falling)?" (e.g., tripped, dizzy spell)       Weakness,sts legs just gave out  Protocols used: Falls and [facility]

## 2023-01-25 NOTE — Telephone Encounter (Signed)
Patient called in regard to amoxicillin-clavulanate (AUGMENTIN) 875-125 MG tablet [[id number]]    Pt is allergic to medication   Is wanting to have another medication sent in   Patient states that she has not taken the medication

## 2023-01-25 NOTE — Telephone Encounter (Signed)
Duplicate message. 

## 2023-01-25 NOTE — Telephone Encounter (Signed)
Patient advised.

## 2023-01-25 NOTE — Telephone Encounter (Signed)
Copied from CRM [id number]. Topic: Clinical - Prescription Issue >> Jan 25, 2023  2:14 PM Deaijah H wrote: Reason for CRM: amoxicillin-clavulanate (AUGMENTIN) 875-125 MG tablet - allergic /mouth breaking out

## 2023-01-29 ENCOUNTER — Other Ambulatory Visit: Payer: Self-pay | Admitting: Internal Medicine

## 2023-01-29 DIAGNOSIS — J418 Mixed simple and mucopurulent chronic bronchitis: Secondary | ICD-10-CM

## 2023-02-05 ENCOUNTER — Encounter: Payer: Self-pay | Admitting: Internal Medicine

## 2023-02-05 ENCOUNTER — Ambulatory Visit: Payer: Medicare HMO | Admitting: Internal Medicine

## 2023-02-05 VITALS — BP 131/75 | HR 60 | Ht 65.0 in | Wt 200.0 lb

## 2023-02-05 DIAGNOSIS — W19XXXA Unspecified fall, initial encounter: Secondary | ICD-10-CM | POA: Diagnosis not present

## 2023-02-05 DIAGNOSIS — J42 Unspecified chronic bronchitis: Secondary | ICD-10-CM | POA: Diagnosis not present

## 2023-02-05 DIAGNOSIS — G6289 Other specified polyneuropathies: Secondary | ICD-10-CM | POA: Diagnosis not present

## 2023-02-05 DIAGNOSIS — M48062 Spinal stenosis, lumbar region with neurogenic claudication: Secondary | ICD-10-CM

## 2023-02-05 DIAGNOSIS — R5381 Other malaise: Secondary | ICD-10-CM | POA: Diagnosis not present

## 2023-02-05 DIAGNOSIS — R0789 Other chest pain: Secondary | ICD-10-CM | POA: Diagnosis not present

## 2023-02-05 MED ORDER — PREGABALIN 50 MG PO CAPS: 50.0000 mg | ORAL_CAPSULE | Freq: Every day | ORAL | 3 refills | Status: DC

## 2023-02-05 NOTE — Patient Instructions (Addendum)
Please continue to take medications as prescribed.  Please continue to follow low salt diet and ambulate as tolerated.  Please take Lyrica 50 mg at bedtime instead of 100 mg.

## 2023-02-05 NOTE — Assessment & Plan Note (Signed)
Likely mechanical fall, has grab bars in bathroom, but was not able to hold it Check x-ray of left-sided ribs as she has chest wall pain Has chronic low back pain, currently has left-sided paraspinal tenderness, appears to be muscular strain

## 2023-02-05 NOTE — Assessment & Plan Note (Signed)
S/p fall Check x-ray of left-sided ribs to rule out rib fracture Tylenol as needed for pain Percocet as needed for severe pain

## 2023-02-05 NOTE — Assessment & Plan Note (Addendum)
Usually well-controlled with Albuterol PRN Would prefer to add maintenance inhaler, but were not cost effective Cheratussin as needed for cough Recently completed levofloxacin for CAP, will need repeat CT chest in 02/25 for pulmonary nodules

## 2023-02-05 NOTE — Assessment & Plan Note (Signed)
Has difficulty ambulating due to lumbar canal stenosis with neurogenic claudication, has chronic leg weakness Has multiple other comorbidities limiting her physical activity, including COPD, paroxysmal A flutter and CKD Would benefit from caregiver/home health aide - form will be submitted to Medicaid - CAP program

## 2023-02-05 NOTE — Assessment & Plan Note (Signed)
Has lumbar radiculopathy as well Has tried Cymbalta with no relief Decreased dose of Lyrica to 50 mg at bedtime as she had drowsiness with 100 mg dose Does not prefer any Neurology referral as she cannot drive to [facility]

## 2023-02-05 NOTE — Assessment & Plan Note (Signed)
S/p lumbar spine surgeries Has chronic low back pain, used to see [facility] Neurology and later [facility] pain clinic Has stopped opioid medications Restarted Lyrica, increased dose to 100 mg, but had drowsiness - decreased dose of Lyrica to 50 mg qHS Due to persistent low back pain, started Percocet 5-325 mg q6h PRN Her current worsening of pain  appears to be due to muscular strain from fall, less likely fracture

## 2023-02-05 NOTE — Progress Notes (Signed)
Acute Office Visit  Subjective:    Patient ID: Paula Long, female    DOB: [birth year], 72 y.o.   MRN: [id number]  Chief Complaint  Patient presents with   Fall    Patient fell about two weeks ago, is requesting assistance    HPI Patient is in today for evaluation after a fall on 01/22/23 in her bathroom.  She fell in between her shower and commode due to her legs giving out.  She has history of lumbar radiculopathy.  She denied any prodromal symptoms such as dizziness, headache or chest pain.  She hit the left side of her chest wall to the bathtub and fell backwards hitting her lower back and neck area.  Denies any major bruising currently.  She has left-sided rib cage pain, which is sharp, radiating towards back and worse with coughing.  She has left-sided low back pain, and has difficulty walking due to her underlying lumbar radiculopathy currently.  Denies any saddle anesthesia, urinary or stool incontinence.  She is taking Lyrica 100 mg nightly for neuropathic pain, and reports  improvement in her pain, but feels drowsy with it.  She was recently given levofloxacin after she had allergic reaction to Augmentin.  She had CT chest on 01/22/23 for hemoptysis, which showed groundglass opacity of the right lung apex with more focal area of consolidation.  Denies any episode of fever, chills or hemoptysis now.  She still has chronic cough and dyspnea, still smokes about 1 pack/day.  Past Medical History:  Diagnosis Date   Anxiety    Arthritis 12-07-11   arthritis,DDD,spinal stenosis   Chronic back pain    COPD (chronic obstructive pulmonary disease) (HCC) 12-07-11   pt. uses nebulizer as needed and inhaler daily   Fibromyalgia    skin cancer   GERD (gastroesophageal reflux disease)    Hypertension 12-07-11   presently no meds   Normal cardiac stress test 06/2009   Skin cancer    melanoma.  face 2014   TIA (transient ischemic attack) ?    Past Surgical History:  Procedure  Laterality Date   ABDOMINAL HYSTERECTOMY     BACK SURGERY  12-07-11   2'10 L5 x2   COLONOSCOPY WITH PROPOFOL N/A 06/03/2014   Procedure: COLONOSCOPY WITH PROPOFOL; IN CECUM AT 0750; TOTAL WITHDRAWAL TIME 16 MINUTES;  Surgeon: Malissa Hippo, MD;  Location: [facility];  Service: Endoscopy;  Laterality: N/A;   left ankle surgery     tendonitis   LUMBAR LAMINECTOMY/DECOMPRESSION MICRODISCECTOMY  12/11/2011   Procedure: LUMBAR LAMINECTOMY/DECOMPRESSION MICRODISCECTOMY;  Surgeon: Jacki Cones, MD;  Location: [facility];  Service: Orthopedics;  Laterality: Left;  Decompressive Lumbar Laminectomy L5-S1 on Left   POLYPECTOMY N/A 06/03/2014   Procedure: POLYPECTOMY;  Surgeon: Malissa Hippo, MD;  Location: [facility];  Service: Endoscopy;  Laterality: N/A;   right wrist surgery for pinched nerve     trigger finger surgery  12-07-11   rt. middle trigger finger release    Family History  Problem Relation Age of Onset   Cancer Mother        uterine   Cancer Father        lung    Social History   Socioeconomic History   Marital status: Single    Spouse name: Not on file   Number of children: Not on file   Years of education: Not on file   Highest education level: Not on file  Occupational History   Not on file  Tobacco Use   Smoking status: Every Day    Current packs/day: 1.50    Average packs/day: 1.5 packs/day for 53.1 years (79.6 ttl pk-yrs)    Types: Cigarettes    Start date: 01/07/1970   Smokeless tobacco: Never   Tobacco comments:    gone down to 1 pack per day   Vaping Use   Vaping status: Never Used  Substance and Sexual Activity   Alcohol use: No    Alcohol/week: 0.0 standard drinks of alcohol   Drug use: No   Sexual activity: Yes    Birth control/protection: Surgical  Other Topics Concern   Not on file  Social History Narrative   Not on file   Social Determinants of Health   Financial Resource Strain: Low Risk  (03/21/2022)   Overall Financial Resource Strain (CARDIA)     Difficulty of Paying Living Expenses: Not hard at all  Food Insecurity: No Food Insecurity (10/17/2021)   Hunger Vital Sign    Worried About Running Out of Food in the Last Year: Never true    Ran Out of Food in the Last Year: Never true  Transportation Needs: No Transportation Needs (03/21/2022)   PRAPARE - Administrator, Civil Service (Medical): No    Lack of Transportation (Non-Medical): No  Physical Activity: Inactive (10/17/2021)   Exercise Vital Sign    Days of Exercise per Week: 0 days    Minutes of Exercise per Session: 0 min  Stress: No Stress Concern Present (10/17/2021)   [employer] of Occupational Health - Occupational Stress Questionnaire    Feeling of Stress : Not at all  Social Connections: Moderately Isolated (10/17/2021)   Social Connection and Isolation Panel [NHANES]    Frequency of Communication with Friends and Family: More than three times a week    Frequency of Social Gatherings with Friends and Family: More than three times a week    Attends Religious Services: Never    Database administrator or Organizations: No    Attends Banker Meetings: Never    Marital Status: Living with partner  Intimate Partner Violence: Not At Risk (10/17/2021)   Humiliation, Afraid, Rape, and Kick questionnaire    Fear of Current or Ex-Partner: No    Emotionally Abused: No    Physically Abused: No    Sexually Abused: No    Outpatient Medications Prior to Visit  Medication Sig Dispense Refill   apixaban (ELIQUIS) 5 MG TABS tablet Take 1 tablet (5 mg total) by mouth 2 (two) times daily. 60 tablet 11   atorvastatin (LIPITOR) 10 MG tablet TAKE ONE TABLET BY MOUTH EVERY DAY 90 tablet 0   desmopressin (DDAVP) 0.2 MG tablet Take 1 tablet (0.2 mg total) by mouth at bedtime. 90 tablet 3   dexlansoprazole (DEXILANT) 60 MG capsule Take 1 capsule (60 mg total) by mouth daily. 30 capsule 5   EASYMAX TEST test strip USE TO TEST ONCE DAILY. 100 each 3   Fluocinolone  Acetonide Body (DERMA-SMOOTHE/FS BODY) 0.01 % OIL Apply to scalp daily a three times a week (Patient not taking: Reported on 03/16/2022) 120 mL 1   guaiFENesin-codeine (ROBITUSSIN AC) 100-10 MG/5ML syrup Take 5 mLs by mouth 3 (three) times daily as needed. 120 mL 0   ipratropium-albuterol (DUONEB) 0.5-2.5 (3) MG/3ML SOLN Take 3 mLs by nebulization every 6 (six) hours as needed. 360 mL 5   labetalol (NORMODYNE) 200 MG tablet Take 1 tablet (200 mg total) by mouth 2 (  two) times daily. 180 tablet 1   levofloxacin (LEVAQUIN) 750 MG tablet Take 1 tablet (750 mg total) by mouth daily. 7 tablet 0   oxyCODONE-acetaminophen (PERCOCET/ROXICET) 5-325 MG tablet Take 1 tablet by mouth every 6 (six) hours as needed for severe pain (pain score 7-10). 30 tablet 0   traZODone (DESYREL) 150 MG tablet TAKE (1) TABLET BY MOUTH AT BEDTIME. 90 tablet 1   VENTOLIN HFA 108 (90 Base) MCG/ACT inhaler INHALE 2 PUFFS INTO THE LUNGS EVERY 6 HOURS AS NEEDED FOR WHEEZING OR SHORTNESS OF BREATH 18 g 2   Vibegron (GEMTESA) 75 MG TABS Take 1 tablet (75 mg total) by mouth daily. 30 tablet 3   pregabalin (LYRICA) 100 MG capsule Take 1 capsule (100 mg total) by mouth 2 (two) times daily. 30 capsule 0   No facility-administered medications prior to visit.    Allergies  Allergen Reactions   Hydrocodone-Acetaminophen    Oxycodone-Acetaminophen    Sulfa Antibiotics    Nystatin Rash    Oral rash   Percocet [Oxycodone-Acetaminophen] Rash    Oral rash   Vesicare [Solifenacin] Rash   Vicodin [Hydrocodone-Acetaminophen] Rash    Oral rash    Review of Systems  Constitutional:  Negative for chills and fever.  HENT:  Negative for congestion, sinus pressure, sinus pain and sore throat.   Eyes:  Negative for pain and discharge.  Respiratory:  Positive for cough (Chronic) and shortness of breath.   Cardiovascular:  Negative for chest pain and palpitations.  Gastrointestinal:  Negative for abdominal pain, diarrhea, nausea and vomiting.   Endocrine: Negative for polydipsia and polyuria.  Genitourinary:  Negative for dysuria and hematuria.  Musculoskeletal:  Positive for arthralgias, back pain and gait problem. Negative for neck pain and neck stiffness.  Skin:  Negative for wound.  Neurological:  Positive for weakness. Negative for dizziness, syncope and headaches.  Psychiatric/Behavioral:  Positive for sleep disturbance. Negative for agitation and behavioral problems. The patient is not nervous/anxious.        Objective:    Physical Exam Vitals reviewed.  Constitutional:      General: She is not in acute distress.    Appearance: She is not diaphoretic.  HENT:     Head: Normocephalic and atraumatic.     Nose: Nose normal.     Mouth/Throat:     Mouth: Mucous membranes are moist.  Eyes:     General: No scleral icterus.    Extraocular Movements: Extraocular movements intact.  Cardiovascular:     Rate and Rhythm: Normal rate and regular rhythm.     Pulses: Normal pulses.     Heart sounds: Murmur (Systolic, right upper sternal border) heard.  Pulmonary:     Breath sounds: No wheezing or rales.  Chest:     Chest wall: Tenderness (Left lower chest wall) present.  Musculoskeletal:        General: Tenderness (Lumbar spinal area) present.     Cervical back: Neck supple. No tenderness.     Right lower leg: No edema.     Left lower leg: No edema.  Skin:    General: Skin is warm and dry.  Neurological:     General: No focal deficit present.     Mental Status: She is alert and oriented to person, place, and time.     Sensory: No sensory deficit.     Motor: Weakness (3/5 in b/l LE, 4/5 in b/l UE) present.     Gait: Gait abnormal (Slow, unsteady).  Psychiatric:  Mood and Affect: Mood normal.        Behavior: Behavior normal.     BP 131/75 (BP Location: Left Arm, Patient Position: Sitting, Cuff Size: Normal)   Pulse 60   Ht 5\' 5"  (1.651 m)   Wt 200 lb (90.7 kg)   SpO2 94%   BMI 33.28 kg/m  Wt Readings  from Last 3 Encounters:  02/05/23 200 lb (90.7 kg)  01/18/23 200 lb (90.7 kg)  01/03/23 200 lb 9.6 oz (91 kg)        Assessment & Plan:   Problem List Items Addressed This Visit       Respiratory   COPD (chronic obstructive pulmonary disease) (HCC)    Usually well-controlled with Albuterol PRN Would prefer to add maintenance inhaler, but were not cost effective Cheratussin as needed for cough Recently completed levofloxacin for CAP, will need repeat CT chest in 02/25 for pulmonary nodules        Nervous and Auditory   Peripheral neuropathy    Has lumbar radiculopathy as well Has tried Cymbalta with no relief Decreased dose of Lyrica to 50 mg at bedtime as she had drowsiness with 100 mg dose Does not prefer any Neurology referral as she cannot drive to [facility]      Relevant Medications   pregabalin (LYRICA) 50 MG capsule     Other   Spinal stenosis, lumbar region, with neurogenic claudication (Chronic)    S/p lumbar spine surgeries Has chronic low back pain, used to see [facility] and later [facility] pain clinic Has stopped opioid medications Restarted Lyrica, increased dose to 100 mg, but had drowsiness - decreased dose of Lyrica to 50 mg qHS Due to persistent low back pain, started Percocet 5-325 mg q6h PRN Her current worsening of pain  appears to be due to muscular strain from fall, less likely fracture      Relevant Medications   pregabalin (LYRICA) 50 MG capsule   Fall - Primary    Likely mechanical fall, has grab bars in bathroom, but was not able to hold it Check x-ray of left-sided ribs as she has chest wall pain Has chronic low back pain, currently has left-sided paraspinal tenderness, appears to be muscular strain       Relevant Orders   DG Ribs Unilateral Left   Physical deconditioning    Has difficulty ambulating due to lumbar canal stenosis with neurogenic claudication, has chronic leg weakness Has multiple other comorbidities limiting  her physical activity, including COPD, paroxysmal A flutter and CKD Would benefit from caregiver/home health aide - form will be submitted to Medicaid - CAP program      Left-sided chest wall pain    S/p fall Check x-ray of left-sided ribs to rule out rib fracture Tylenol as needed for pain Percocet as needed for severe pain      Relevant Orders   DG Ribs Unilateral Left     Meds ordered this encounter  Medications   pregabalin (LYRICA) 50 MG capsule    Sig: Take 1 capsule (50 mg total) by mouth at bedtime.    Dispense:  30 capsule    Refill:  3     Dallas Torok Concha Se, MD

## 2023-02-13 ENCOUNTER — Encounter: Payer: Self-pay | Admitting: Internal Medicine

## 2023-02-13 ENCOUNTER — Ambulatory Visit (INDEPENDENT_AMBULATORY_CARE_PROVIDER_SITE_OTHER): Payer: Medicare HMO | Admitting: Internal Medicine

## 2023-02-13 VITALS — BP 104/54 | HR 67

## 2023-02-13 DIAGNOSIS — K219 Gastro-esophageal reflux disease without esophagitis: Secondary | ICD-10-CM

## 2023-02-13 DIAGNOSIS — I10 Essential (primary) hypertension: Secondary | ICD-10-CM

## 2023-02-13 DIAGNOSIS — J441 Chronic obstructive pulmonary disease with (acute) exacerbation: Secondary | ICD-10-CM | POA: Diagnosis not present

## 2023-02-13 DIAGNOSIS — J9611 Chronic respiratory failure with hypoxia: Secondary | ICD-10-CM

## 2023-02-13 MED ORDER — IPRATROPIUM-ALBUTEROL 0.5-2.5 (3) MG/3ML IN SOLN: 3.0000 mL | Freq: Four times a day (QID) | RESPIRATORY_TRACT | 5 refills | Status: DC | PRN

## 2023-02-13 MED ORDER — AZITHROMYCIN 250 MG PO TABS: ORAL_TABLET | ORAL | 0 refills | Status: DC

## 2023-02-13 MED ORDER — PREDNISONE 10 MG (21) PO TBPK: ORAL_TABLET | ORAL | 0 refills | Status: DC

## 2023-02-13 MED ORDER — DEXLANSOPRAZOLE 60 MG PO CPDR: 60.0000 mg | DELAYED_RELEASE_CAPSULE | Freq: Every day | ORAL | 5 refills | Status: DC

## 2023-02-13 NOTE — Assessment & Plan Note (Addendum)
Has wheezing and rhonchi today, likely COPD exacerbation Started azithromycin and Sterapred taper Needs to use DuoNeb regularly for now - nebulizer prescription sent, would be more compliant and better tolerated for her than inhaler Uncontrolled with Albuterol PRN Would prefer to add maintenance inhaler, but were not cost effective Cheratussin as needed for cough Recently completed levofloxacin for CAP, will need repeat CT chest in 02/25 for pulmonary nodules

## 2023-02-13 NOTE — Progress Notes (Signed)
Established Patient Office Visit  Subjective:  Patient ID: Paula Long, female    DOB: 1950-08-14  Age: 72 y.o. MRN: [id number]  CC:  Chief Complaint  Patient presents with   Pneumonia    Patient had pneumonia, has completed course of medication, is not any better     HPI Paula Long is a 72 y.o. 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  She was recently given levofloxacin after she had allergic reaction to Augmentin.  She had CT chest on 01/22/23 for hemoptysis, which showed groundglass opacity of the right lung apex with more focal area of consolidation.  Denies any episode of fever, chills or hemoptysis now.  She still has chronic cough and dyspnea, worse for the last 2 weeks, still smokes about 1 pack/day. She has not been using her Duoneb as she has lost her nebulizer machine. She has not been able to afford maintenance inhaler.   Past Medical History:  Diagnosis Date   Anxiety    Arthritis 12-07-11   arthritis,DDD,spinal stenosis   Chronic back pain    COPD (chronic obstructive pulmonary disease) (HCC) 12-07-11   pt. uses nebulizer as needed and inhaler daily   Fibromyalgia    skin cancer   GERD (gastroesophageal reflux disease)    Hypertension 12-07-11   presently no meds   Normal cardiac stress test 06/2009   Skin cancer    melanoma.  face 2014   TIA (transient ischemic attack) ?    Past Surgical History:  Procedure Laterality Date   ABDOMINAL HYSTERECTOMY     BACK SURGERY  12-07-11   2'10 L5 x2   COLONOSCOPY WITH PROPOFOL N/A 06/03/2014   Procedure: COLONOSCOPY WITH PROPOFOL; IN CECUM AT 0750; TOTAL WITHDRAWAL TIME 16 MINUTES;  Surgeon: Malissa Hippo, MD;  Location: [facility];  Service: Endoscopy;  Laterality: N/A;   left ankle surgery      tendonitis   LUMBAR LAMINECTOMY/DECOMPRESSION MICRODISCECTOMY  12/11/2011   Procedure: LUMBAR LAMINECTOMY/DECOMPRESSION MICRODISCECTOMY;  Surgeon: Jacki Cones, MD;  Location: [facility];  Service: Orthopedics;  Laterality: Left;  Decompressive Lumbar Laminectomy L5-S1 on Left   POLYPECTOMY N/A 06/03/2014   Procedure: POLYPECTOMY;  Surgeon: Malissa Hippo, MD;  Location: [facility];  Service: Endoscopy;  Laterality: N/A;   right wrist surgery for pinched nerve     trigger finger surgery  12-07-11   rt. middle trigger finger release    Family History  Problem Relation Age of Onset   Cancer Mother        uterine   Cancer Father        lung    Social History   Socioeconomic History   Marital status: Single    Spouse name: Not on file   Number of children: Not on file   Years of education: Not on file   Highest education level: Not on file  Occupational History   Not on file  Tobacco Use   Smoking status: Every Day    Current packs/day: 1.50    Average packs/day: 1.5 packs/day for 53.1 years (79.6 ttl pk-yrs)    Types: Cigarettes    Start date: 01/07/1970   Smokeless tobacco: Never   Tobacco comments:    gone down to 1 pack per day   Vaping Use  Vaping status: Never Used  Substance and Sexual Activity   Alcohol use: No    Alcohol/week: 0.0 standard drinks of alcohol   Drug use: No   Sexual activity: Yes    Birth control/protection: Surgical  Other Topics Concern   Not on file  Social History Narrative   Not on file   Social Determinants of Health   Financial Resource Strain: Low Risk  (03/21/2022)   Overall Financial Resource Strain (CARDIA)    Difficulty of Paying Living Expenses: Not hard at all  Food Insecurity: No Food Insecurity (10/17/2021)   Hunger Vital Sign    Worried About Running Out of Food in the Last Year: Never true    Ran Out of Food in the Last Year: Never true  Transportation Needs: No Transportation Needs (03/21/2022)   PRAPARE - Therapist, art (Medical): No    Lack of Transportation (Non-Medical): No  Physical Activity: Inactive (10/17/2021)   Exercise Vital Sign    Days of Exercise per Week: 0 days    Minutes of Exercise per Session: 0 min  Stress: No Stress Concern Present (10/17/2021)   [employer] of Occupational Health - Occupational Stress Questionnaire    Feeling of Stress : Not at all  Social Connections: Moderately Isolated (10/17/2021)   Social Connection and Isolation Panel [NHANES]    Frequency of Communication with Friends and Family: More than three times a week    Frequency of Social Gatherings with Friends and Family: More than three times a week    Attends Religious Services: Never    Database administrator or Organizations: No    Attends Banker Meetings: Never    Marital Status: Living with partner  Intimate Partner Violence: Not At Risk (10/17/2021)   Humiliation, Afraid, Rape, and Kick questionnaire    Fear of Current or Ex-Partner: No    Emotionally Abused: No    Physically Abused: No    Sexually Abused: No    Outpatient Medications Prior to Visit  Medication Sig Dispense Refill   apixaban (ELIQUIS) 5 MG TABS tablet Take 1 tablet (5 mg total) by mouth 2 (two) times daily. 60 tablet 11   atorvastatin (LIPITOR) 10 MG tablet TAKE ONE TABLET BY MOUTH EVERY DAY 90 tablet 0   desmopressin (DDAVP) 0.2 MG tablet Take 1 tablet (0.2 mg total) by mouth at bedtime. 90 tablet 3   EASYMAX TEST test strip USE TO TEST ONCE DAILY. 100 each 3   Fluocinolone Acetonide Body (DERMA-SMOOTHE/FS BODY) 0.01 % OIL Apply to scalp daily a three times a week (Patient not taking: Reported on 03/16/2022) 120 mL 1   guaiFENesin-codeine (ROBITUSSIN AC) 100-10 MG/5ML syrup Take 5 mLs by mouth 3 (three) times daily as needed. 120 mL 0   labetalol (NORMODYNE) 200 MG tablet Take 1 tablet (200 mg total) by mouth 2 (two) times daily. 180 tablet 1   oxyCODONE-acetaminophen (PERCOCET/ROXICET) 5-325 MG  tablet Take 1 tablet by mouth every 6 (six) hours as needed for severe pain (pain score 7-10). 30 tablet 0   pregabalin (LYRICA) 50 MG capsule Take 1 capsule (50 mg total) by mouth at bedtime. 30 capsule 3   traZODone (DESYREL) 150 MG tablet TAKE (1) TABLET BY MOUTH AT BEDTIME. 90 tablet 1   VENTOLIN HFA 108 (90 Base) MCG/ACT inhaler INHALE 2 PUFFS INTO THE LUNGS EVERY 6 HOURS AS NEEDED FOR WHEEZING OR SHORTNESS OF BREATH 18 g 2   Vibegron (GEMTESA) 75  MG TABS Take 1 tablet (75 mg total) by mouth daily. 30 tablet 3   dexlansoprazole (DEXILANT) 60 MG capsule Take 1 capsule (60 mg total) by mouth daily. 30 capsule 5   ipratropium-albuterol (DUONEB) 0.5-2.5 (3) MG/3ML SOLN Take 3 mLs by nebulization every 6 (six) hours as needed. 360 mL 5   levofloxacin (LEVAQUIN) 750 MG tablet Take 1 tablet (750 mg total) by mouth daily. 7 tablet 0   No facility-administered medications prior to visit.    Allergies  Allergen Reactions   Hydrocodone-Acetaminophen    Oxycodone-Acetaminophen    Sulfa Antibiotics    Nystatin Rash    Oral rash   Percocet [Oxycodone-Acetaminophen] Rash    Oral rash   Vesicare [Solifenacin] Rash   Vicodin [Hydrocodone-Acetaminophen] Rash    Oral rash    ROS Review of Systems  Constitutional:  Negative for chills and fever.  HENT:  Negative for congestion, sinus pressure, sinus pain and sore throat.   Eyes:  Negative for pain and discharge.  Respiratory:  Positive for cough (Chronic) and shortness of breath.   Cardiovascular:  Negative for chest pain and palpitations.  Gastrointestinal:  Negative for abdominal pain, diarrhea, nausea and vomiting.  Endocrine: Negative for polydipsia and polyuria.  Genitourinary:  Negative for dysuria and hematuria.  Musculoskeletal:  Positive for arthralgias, back pain and gait problem. Negative for neck pain and neck stiffness.  Skin:  Negative for wound.  Neurological:  Positive for weakness. Negative for dizziness, syncope and  headaches.  Psychiatric/Behavioral:  Positive for sleep disturbance. Negative for agitation and behavioral problems. The patient is not nervous/anxious.       Objective:    Physical Exam Vitals reviewed.  Constitutional:      General: She is not in acute distress.    Appearance: She is not diaphoretic.  HENT:     Head: Normocephalic and atraumatic.     Nose: Nose normal.     Mouth/Throat:     Mouth: Mucous membranes are moist.  Eyes:     General: No scleral icterus.    Extraocular Movements: Extraocular movements intact.  Cardiovascular:     Rate and Rhythm: Normal rate and regular rhythm.     Pulses: Normal pulses.     Heart sounds: Murmur (Systolic, right upper sternal border) heard.  Pulmonary:     Breath sounds: Wheezing (B/l, diffuse) and rhonchi (Diffuse) present.  Chest:     Chest wall: Tenderness (Left lower chest wall) present.  Musculoskeletal:        General: Tenderness (Lumbar spinal area) present.     Cervical back: Neck supple. No tenderness.     Right lower leg: No edema.     Left lower leg: No edema.  Skin:    General: Skin is warm and dry.  Neurological:     General: No focal deficit present.     Mental Status: She is alert and oriented to person, place, and time.     Sensory: No sensory deficit.     Motor: Weakness (3/5 in b/l LE, 4/5 in b/l UE) present.     Gait: Gait abnormal (Slow, unsteady).  Psychiatric:        Mood and Affect: Mood normal.        Behavior: Behavior normal.     BP (!) 104/54 (BP Location: Left Arm, Patient Position: Sitting, Cuff Size: Normal)   Pulse 67   SpO2 93%  Wt Readings from Last 3 Encounters:  02/05/23 200 lb (90.7 kg)  01/18/23  200 lb (90.7 kg)  01/03/23 200 lb 9.6 oz (91 kg)    Lab Results  Component Value Date   TSH 2.180 08/21/2022   Lab Results  Component Value Date   WBC 7.6 01/18/2023   HGB 11.8 01/18/2023   HCT 38.1 01/18/2023   MCV 99 (H) 01/18/2023   PLT 228 01/18/2023   Lab Results   Component Value Date   NA 143 01/18/2023   K 4.7 01/18/2023   CO2 28 01/18/2023   GLUCOSE 85 01/18/2023   BUN 22 01/18/2023   CREATININE 1.55 (H) 01/18/2023   BILITOT <0.2 08/21/2022   ALKPHOS 76 08/21/2022   AST 13 08/21/2022   ALT 11 08/21/2022   PROT 6.7 08/21/2022   ALBUMIN 4.0 08/21/2022   CALCIUM 8.8 01/18/2023   ANIONGAP 5 03/18/2022   EGFR 36 (L) 01/18/2023   Lab Results  Component Value Date   CHOL 99 (L) 08/21/2022   Lab Results  Component Value Date   HDL 62 08/21/2022   Lab Results  Component Value Date   LDLCALC 26 08/21/2022   Lab Results  Component Value Date   TRIG 40 08/21/2022   Lab Results  Component Value Date   CHOLHDL 1.6 08/21/2022   Lab Results  Component Value Date   HGBA1C 6.0 (H) 08/21/2022      Assessment & Plan:   Problem List Items Addressed This Visit       Cardiovascular and Mediastinum   Essential hypertension    BP Readings from Last 1 Encounters:  02/13/23 (!) 104/54   Well-controlled now Was on Amlodipine 10 mg QD and Labetalol 200 mg BID - advised to continue Labetalol 200 mg BID for now, if persistently elevated, can add amlodipine Needs to improve hydration Counseled for compliance with the medications Advised DASH diet and moderate exercise/walking as tolerated Follows up with Cardiologist        Respiratory   COPD (chronic obstructive pulmonary disease) (HCC) with acute exacerbation - Primary    Has wheezing and rhonchi today, likely COPD exacerbation Started azithromycin and Sterapred taper Needs to use DuoNeb regularly for now - nebulizer prescription sent, would be more compliant and better tolerated for her than inhaler Uncontrolled with Albuterol PRN Would prefer to add maintenance inhaler, but were not cost effective Cheratussin as needed for cough Recently completed levofloxacin for CAP, will need repeat CT chest in 02/25 for pulmonary nodules      Relevant Medications   azithromycin  (ZITHROMAX) 250 MG tablet   predniSONE (STERAPRED UNI-PAK 21 TAB) 10 MG (21) TBPK tablet   ipratropium-albuterol (DUONEB) 0.5-2.5 (3) MG/3ML SOLN   Other Relevant Orders   For home use only DME Nebulizer machine   Chronic hypoxic respiratory failure (HCC)    Has COPD exacerbation Still has dyspnea and O2 desats upon ambulation, would benefit from home O2 Continue albuterol neb/inhaler as needed for dyspnea or wheezing        Digestive   Gastroesophageal reflux disease    Well controlled with Dexilant, needs to continue it Has tried pantoprazole, Nexium and omeprazole      Relevant Medications   dexlansoprazole (DEXILANT) 60 MG capsule    Meds ordered this encounter  Medications   azithromycin (ZITHROMAX) 250 MG tablet    Sig: Take 2 tablets on day 1, then 1 tablet daily on days 2 through 5    Dispense:  6 tablet    Refill:  0   predniSONE (STERAPRED UNI-PAK 21 TAB) 10 MG (  21) TBPK tablet    Sig: Take as package instructions.    Dispense:  1 each    Refill:  0   dexlansoprazole (DEXILANT) 60 MG capsule    Sig: Take 1 capsule (60 mg total) by mouth daily.    Dispense:  30 capsule    Refill:  5   ipratropium-albuterol (DUONEB) 0.5-2.5 (3) MG/3ML SOLN    Sig: Take 3 mLs by nebulization every 6 (six) hours as needed.    Dispense:  360 mL    Refill:  5    Please dispense nebulizer as well.    Follow-up: Return if symptoms worsen or fail to improve.    Anabel Halon, MD

## 2023-02-13 NOTE — Assessment & Plan Note (Signed)
Has COPD exacerbation Still has dyspnea and O2 desats upon ambulation, would benefit from home O2 Continue albuterol neb/inhaler as needed for dyspnea or wheezing

## 2023-02-13 NOTE — Patient Instructions (Addendum)
Please start taking Azithromycin and Prednisone as prescribed.  Please use Duoneb nebulizer at least 4 times in a day for next 1 week.  Please try to cut down-> quit smoking.

## 2023-02-13 NOTE — Assessment & Plan Note (Signed)
Well controlled with Dexilant, needs to continue it Has tried pantoprazole, Nexium and omeprazole

## 2023-02-13 NOTE — Assessment & Plan Note (Signed)
BP Readings from Last 1 Encounters:  02/13/23 (!) 104/54   Well-controlled now Was on Amlodipine 10 mg QD and Labetalol 200 mg BID - advised to continue Labetalol 200 mg BID for now, if persistently elevated, can add amlodipine Needs to improve hydration Counseled for compliance with the medications Advised DASH diet and moderate exercise/walking as tolerated Follows up with Cardiologist

## 2023-02-16 ENCOUNTER — Other Ambulatory Visit: Payer: Self-pay | Admitting: Internal Medicine

## 2023-02-16 DIAGNOSIS — J42 Unspecified chronic bronchitis: Secondary | ICD-10-CM

## 2023-02-16 DIAGNOSIS — J441 Chronic obstructive pulmonary disease with (acute) exacerbation: Secondary | ICD-10-CM

## 2023-02-22 ENCOUNTER — Telehealth: Payer: Self-pay

## 2023-02-22 ENCOUNTER — Other Ambulatory Visit: Payer: Self-pay | Admitting: Internal Medicine

## 2023-02-22 NOTE — Telephone Encounter (Signed)
Lmtrc

## 2023-02-22 NOTE — Telephone Encounter (Signed)
Copied from CRM [id number]. Topic: Clinical - Medication Question >> Feb 22, 2023  2:22 PM Colletta Maryland S wrote: Reason for CRM: pt would like to know if pregabalin (LYRICA) 50 MG capsule rx can be switched back to 100 mg instead of 50 mg

## 2023-02-22 NOTE — Telephone Encounter (Signed)
Patient is returning the call from the office she would like a callback regarding this matter

## 2023-02-22 NOTE — Telephone Encounter (Signed)
Patient advised.

## 2023-03-01 ENCOUNTER — Other Ambulatory Visit: Payer: Self-pay | Admitting: Internal Medicine

## 2023-03-01 DIAGNOSIS — M48062 Spinal stenosis, lumbar region with neurogenic claudication: Secondary | ICD-10-CM

## 2023-03-28 ENCOUNTER — Other Ambulatory Visit: Payer: Self-pay

## 2023-03-28 DIAGNOSIS — E782 Mixed hyperlipidemia: Secondary | ICD-10-CM

## 2023-03-28 MED ORDER — ATORVASTATIN CALCIUM 10 MG PO TABS: 10.0000 mg | ORAL_TABLET | Freq: Every day | ORAL | 0 refills | Status: DC

## 2023-04-10 ENCOUNTER — Ambulatory Visit: Payer: Medicare Other | Admitting: Internal Medicine

## 2023-04-12 ENCOUNTER — Ambulatory Visit: Payer: Medicare HMO | Admitting: Internal Medicine

## 2023-04-24 DIAGNOSIS — Z6833 Body mass index (BMI) 33.0-33.9, adult: Secondary | ICD-10-CM | POA: Diagnosis not present

## 2023-04-24 DIAGNOSIS — I5032 Chronic diastolic (congestive) heart failure: Secondary | ICD-10-CM | POA: Diagnosis not present

## 2023-04-24 DIAGNOSIS — N2581 Secondary hyperparathyroidism of renal origin: Secondary | ICD-10-CM | POA: Diagnosis not present

## 2023-04-24 DIAGNOSIS — I129 Hypertensive chronic kidney disease with stage 1 through stage 4 chronic kidney disease, or unspecified chronic kidney disease: Secondary | ICD-10-CM | POA: Diagnosis not present

## 2023-04-24 DIAGNOSIS — N1832 Chronic kidney disease, stage 3b: Secondary | ICD-10-CM | POA: Diagnosis not present

## 2023-04-26 DIAGNOSIS — I129 Hypertensive chronic kidney disease with stage 1 through stage 4 chronic kidney disease, or unspecified chronic kidney disease: Secondary | ICD-10-CM | POA: Diagnosis not present

## 2023-04-26 DIAGNOSIS — N2581 Secondary hyperparathyroidism of renal origin: Secondary | ICD-10-CM | POA: Diagnosis not present

## 2023-04-26 DIAGNOSIS — I5032 Chronic diastolic (congestive) heart failure: Secondary | ICD-10-CM | POA: Diagnosis not present

## 2023-04-26 DIAGNOSIS — N1832 Chronic kidney disease, stage 3b: Secondary | ICD-10-CM | POA: Diagnosis not present

## 2023-05-08 ENCOUNTER — Encounter: Payer: Self-pay | Admitting: Internal Medicine

## 2023-05-08 ENCOUNTER — Ambulatory Visit (INDEPENDENT_AMBULATORY_CARE_PROVIDER_SITE_OTHER): Payer: No Typology Code available for payment source | Admitting: Internal Medicine

## 2023-05-08 VITALS — BP 138/88 | HR 82 | Ht 65.0 in | Wt 211.0 lb

## 2023-05-08 DIAGNOSIS — E559 Vitamin D deficiency, unspecified: Secondary | ICD-10-CM | POA: Diagnosis not present

## 2023-05-08 DIAGNOSIS — J9611 Chronic respiratory failure with hypoxia: Secondary | ICD-10-CM

## 2023-05-08 DIAGNOSIS — N1832 Chronic kidney disease, stage 3b: Secondary | ICD-10-CM

## 2023-05-08 DIAGNOSIS — I10 Essential (primary) hypertension: Secondary | ICD-10-CM | POA: Diagnosis not present

## 2023-05-08 DIAGNOSIS — I4892 Unspecified atrial flutter: Secondary | ICD-10-CM | POA: Diagnosis not present

## 2023-05-08 DIAGNOSIS — E782 Mixed hyperlipidemia: Secondary | ICD-10-CM | POA: Diagnosis not present

## 2023-05-08 DIAGNOSIS — J42 Unspecified chronic bronchitis: Secondary | ICD-10-CM | POA: Diagnosis not present

## 2023-05-08 DIAGNOSIS — K219 Gastro-esophageal reflux disease without esophagitis: Secondary | ICD-10-CM

## 2023-05-08 DIAGNOSIS — Z1231 Encounter for screening mammogram for malignant neoplasm of breast: Secondary | ICD-10-CM

## 2023-05-08 DIAGNOSIS — R918 Other nonspecific abnormal finding of lung field: Secondary | ICD-10-CM

## 2023-05-08 DIAGNOSIS — R7303 Prediabetes: Secondary | ICD-10-CM

## 2023-05-08 DIAGNOSIS — M48062 Spinal stenosis, lumbar region with neurogenic claudication: Secondary | ICD-10-CM

## 2023-05-08 DIAGNOSIS — G894 Chronic pain syndrome: Secondary | ICD-10-CM

## 2023-05-08 MED ORDER — OXYCODONE-ACETAMINOPHEN 5-325 MG PO TABS: 1.0000 | ORAL_TABLET | Freq: Two times a day (BID) | ORAL | 0 refills | Status: DC | PRN

## 2023-05-08 NOTE — Assessment & Plan Note (Addendum)
 Needs to use DuoNeb regularly for now - nebulizer prescription sent, would be more compliant and better tolerated for her than inhaler Well-controlled now Would prefer to add maintenance inhaler, but were not cost effective Cheratussin as needed for cough Recently completed levofloxacin for CAP, needs repeat CT chest for pulmonary nodules

## 2023-05-08 NOTE — Assessment & Plan Note (Signed)
 Has COPD exacerbation Still has dyspnea and O2 desats upon ambulation, would benefit from home O2 Continue albuterol neb/inhaler as needed for dyspnea or wheezing

## 2023-05-08 NOTE — Assessment & Plan Note (Signed)
 Well controlled with Dexilant, needs to continue it Has tried pantoprazole, Nexium and omeprazole

## 2023-05-08 NOTE — Assessment & Plan Note (Signed)
 On statin.

## 2023-05-08 NOTE — Assessment & Plan Note (Addendum)
 Currently in sinus rhythm On labetalol for rate control and HTN, counseled for compliance On Eliquis, needs to take it twice daily

## 2023-05-08 NOTE — Assessment & Plan Note (Addendum)
 Was on chronic opioid treatment Referred to pain clinic in [place] again as she prefers to restart chronic opioids

## 2023-05-08 NOTE — Assessment & Plan Note (Signed)
Lab Results  Component Value Date   HGBA1C 6.0 (H) 08/21/2022   Advised to follow DASH diet

## 2023-05-08 NOTE — Assessment & Plan Note (Signed)
 BP Readings from Last 1 Encounters:  05/08/23 138/88   Well-controlled now On Labetalol 200 mg BID - advised to continue Labetalol 200 mg BID for now, if persistently elevated, can add amlodipine Needs to improve hydration Counseled for compliance with the medications Advised DASH diet and moderate exercise/walking as tolerated Follows up with Cardiologist

## 2023-05-08 NOTE — Patient Instructions (Addendum)
 Please schedule medicare annual wellness.  Please schedule Mammogram.  Please get CT chest done as scheduled.  You are being referred to [facility] pain clinic.  Please continue to take medications as prescribed.  Please continue to follow low salt diet and perform moderate exercise/walking as tolerated.  Please get fasting blood tests done before the next visit.

## 2023-05-08 NOTE — Progress Notes (Addendum)
 Established Patient Office Visit  Subjective:  Patient ID: Paula Long, female    DOB: [birth year]  Age: 73 y.o. MRN: [id number]  CC:  Chief Complaint  Patient presents with   Care Management    2 month f/u, pt reports legs giving out on her reports pain is 10/10 on her back.     HPI Paula Long is a 73 y.o. 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  HTN: BP is wnl now.  Takes labetalol 200 mg twice daily currently.  Patient denies headache, dizziness, chest pain, or palpitations.  A Flutter: Denies dyspnea or palpitations. Takes labetalol and Eliquis regularly, although mentions having cold sensation while she takes Eliquis.  CKD stage III: She has seen nephrology.  She denies any dysuria, hematuria, urinary hesitancy or resistance. Last BMP showed GFR of 31, overall stable. She has chronic urinary incontinence, and takes desmopressin 0.2 mg nightly currently.  She complains of chronic low back pain and burning pain in her b/l LE.  She has chronic weakness of bilateral LE.  She was on Cymbalta for neuropathy, but it did not work for her.  Of note, she used to go to pain clinic for lumbar radiculopathy, and take Dilaudid 4 mg twice daily, but states that it did not help her pain and has stopped taking it.  She recently took Percocet 5-325 mg with adequate relief.  Denies any recent fall or injury.  Denies any saddle anesthesia or stool incontinence. She was given Lyrica in the past, but is not effective.  COPD: She reports improvement in dyspnea upon minimal exertion.  She was given albuterol nebulizer for better compliance and easier use, but was not able to get nebulizer machine through pharmacy.  She denies any fever or chills.  She is currently using  albuterol inhaler as needed for dyspnea, but has not needed it frequently.  She still smokes 1.5 pack/day.  From previous visit: Her oxygen saturation was 91% on room air in resting conditions.  Her oxygen saturation dropped to 86% on ambulation on room air, but improved to 92% on 2 L O2.   Past Medical History:  Diagnosis Date   Anxiety    Arthritis 12-07-11   arthritis,DDD,spinal stenosis   Chronic back pain    COPD (chronic obstructive pulmonary disease) (HCC) 12-07-11   pt. uses nebulizer as needed and inhaler daily   Fibromyalgia    skin cancer   GERD (gastroesophageal reflux disease)    Hypertension 12-07-11   presently no meds   Normal cardiac stress test 06/2009   Skin cancer    melanoma.  face 2014   TIA (transient ischemic attack) ?    Past Surgical History:  Procedure Laterality Date   ABDOMINAL HYSTERECTOMY     BACK SURGERY  12-07-11   2'10 L5 x2   COLONOSCOPY WITH PROPOFOL N/A 06/03/2014   Procedure: COLONOSCOPY WITH PROPOFOL; IN CECUM AT 0750; TOTAL WITHDRAWAL TIME 16 MINUTES;  Surgeon: Malissa Hippo, MD;  Location: [facility];  Service: Endoscopy;  Laterality: N/A;   left ankle surgery     tendonitis   LUMBAR LAMINECTOMY/DECOMPRESSION MICRODISCECTOMY  12/11/2011   Procedure: LUMBAR LAMINECTOMY/DECOMPRESSION MICRODISCECTOMY;  Surgeon: Jacki Cones, MD;  Location: [facility];  Service: Orthopedics;  Laterality: Left;  Decompressive Lumbar Laminectomy  L5-S1 on Left   POLYPECTOMY N/A 06/03/2014   Procedure: POLYPECTOMY;  Surgeon: Malissa Hippo, MD;  Location: [facility];  Service: Endoscopy;  Laterality: N/A;   right wrist surgery for pinched nerve     trigger finger surgery  12-07-11   rt. middle trigger finger release    Family History  Problem Relation Age of Onset   Cancer Mother        uterine   Cancer Father        lung    Social History   Socioeconomic History   Marital status: Single    Spouse name: Not on file   Number of children: Not on file   Years of  education: Not on file   Highest education level: Not on file  Occupational History   Not on file  Tobacco Use   Smoking status: Every Day    Current packs/day: 1.50    Average packs/day: 1.5 packs/day for 53.3 years (80.0 ttl pk-yrs)    Types: Cigarettes    Start date: 01/07/1970   Smokeless tobacco: Never   Tobacco comments:    gone down to 1 pack per day   Vaping Use   Vaping status: Never Used  Substance and Sexual Activity   Alcohol use: No    Alcohol/week: 0.0 standard drinks of alcohol   Drug use: No   Sexual activity: Yes    Birth control/protection: Surgical  Other Topics Concern   Not on file  Social History Narrative   Not on file   Social Drivers of Health   Financial Resource Strain: Low Risk  (03/21/2022)   Overall Financial Resource Strain (CARDIA)    Difficulty of Paying Living Expenses: Not hard at all  Food Insecurity: No Food Insecurity (10/17/2021)   Hunger Vital Sign    Worried About Running Out of Food in the Last Year: Never true    Ran Out of Food in the Last Year: Never true  Transportation Needs: No Transportation Needs (03/21/2022)   PRAPARE - Administrator, Civil Service (Medical): No    Lack of Transportation (Non-Medical): No  Physical Activity: Inactive (10/17/2021)   Exercise Vital Sign    Days of Exercise per Week: 0 days    Minutes of Exercise per Session: 0 min  Stress: No Stress Concern Present (10/17/2021)   [employer] of Occupational Health - Occupational Stress Questionnaire    Feeling of Stress : Not at all  Social Connections: Moderately Isolated (10/17/2021)   Social Connection and Isolation Panel [NHANES]    Frequency of Communication with Friends and Family: More than three times a week    Frequency of Social Gatherings with Friends and Family: More than three times a week    Attends Religious Services: Never    Database administrator or Organizations: No    Attends Banker Meetings: Never     Marital Status: Living with partner  Intimate Partner Violence: Not At Risk (10/17/2021)   Humiliation, Afraid, Rape, and Kick questionnaire    Fear of Current or Ex-Partner: No    Emotionally Abused: No    Physically Abused: No    Sexually Abused: No    Outpatient Medications Prior to Visit  Medication Sig Dispense Refill   apixaban (ELIQUIS) 5 MG TABS tablet Take 1 tablet (5 mg total) by mouth 2 (two) times daily. 60 tablet 11   atorvastatin (LIPITOR) 10 MG tablet Take 1 tablet (10 mg total) by mouth daily.  90 tablet 0   desmopressin (DDAVP) 0.2 MG tablet Take 1 tablet (0.2 mg total) by mouth at bedtime. 90 tablet 3   dexlansoprazole (DEXILANT) 60 MG capsule Take 1 capsule (60 mg total) by mouth daily. 30 capsule 5   EASYMAX TEST test strip USE TO TEST ONCE DAILY. 100 each 3   Fluocinolone Acetonide Body (DERMA-SMOOTHE/FS BODY) 0.01 % OIL Apply to scalp daily a three times a week 120 mL 1   ipratropium-albuterol (DUONEB) 0.5-2.5 (3) MG/3ML SOLN Take 3 mLs by nebulization every 6 (six) hours as needed. 360 mL 5   labetalol (NORMODYNE) 200 MG tablet Take 1 tablet (200 mg total) by mouth 2 (two) times daily. 180 tablet 1   pantoprazole (PROTONIX) 40 MG tablet TAKE 1 TABLET BY MOUTH ONCE DAILY. 90 tablet 3   traZODone (DESYREL) 150 MG tablet TAKE (1) TABLET BY MOUTH AT BEDTIME. 90 tablet 1   VENTOLIN HFA 108 (90 Base) MCG/ACT inhaler INHALE 2 PUFFS INTO THE LUNGS EVERY 6 HOURS AS NEEDED FOR WHEEZING OR SHORTNESS OF BREATH 18 g 2   Vibegron (GEMTESA) 75 MG TABS Take 1 tablet (75 mg total) by mouth daily. 30 tablet 3   azithromycin (ZITHROMAX) 250 MG tablet Take 2 tablets on day 1, then 1 tablet daily on days 2 through 5 6 tablet 0   guaiFENesin-codeine 100-10 MG/5ML syrup TAKE BY MOUTH THREE TIMES DAILY AS NEEDED 120 mL 0   oxyCODONE-acetaminophen (PERCOCET/ROXICET) 5-325 MG tablet Take 1 tablet by mouth every 6 (six) hours as needed for severe pain (pain score 7-10). 30 tablet 0    predniSONE (STERAPRED UNI-PAK 21 TAB) 10 MG (21) TBPK tablet Take as package instructions. 1 each 0   pregabalin (LYRICA) 50 MG capsule Take 1 capsule (50 mg total) by mouth at bedtime. 30 capsule 3   No facility-administered medications prior to visit.    Allergies  Allergen Reactions   Hydrocodone-Acetaminophen    Sulfa Antibiotics    Nystatin Rash    Oral rash   Percocet [Oxycodone-Acetaminophen] Rash    Oral rash   Vesicare [Solifenacin] Rash   Vicodin [Hydrocodone-Acetaminophen] Rash    Oral rash    ROS Review of Systems  Constitutional:  Negative for chills and fever.  HENT:  Negative for congestion, sinus pressure, sinus pain and sore throat.   Eyes:  Negative for pain and discharge.  Respiratory:  Positive for cough (Chronic) and shortness of breath (Intermittent).   Cardiovascular:  Negative for chest pain and palpitations.  Gastrointestinal:  Negative for abdominal pain, diarrhea, nausea and vomiting.  Endocrine: Negative for polydipsia and polyuria.  Genitourinary:  Negative for dysuria and hematuria.  Musculoskeletal:  Positive for arthralgias and back pain. Negative for neck pain and neck stiffness.  Skin:  Negative for wound.  Neurological:  Positive for weakness. Negative for dizziness, syncope and headaches.  Psychiatric/Behavioral:  Positive for sleep disturbance. Negative for agitation and behavioral problems. The patient is not nervous/anxious.       Objective:    Physical Exam Vitals reviewed.  Constitutional:      General: She is not in acute distress.    Appearance: She is not diaphoretic.  HENT:     Head: Normocephalic and atraumatic.     Nose: Nose normal.     Mouth/Throat:     Mouth: Mucous membranes are moist.  Eyes:     General: No scleral icterus.    Extraocular Movements: Extraocular movements intact.  Cardiovascular:  Rate and Rhythm: Normal rate and regular rhythm.     Pulses: Normal pulses.     Heart sounds: Murmur (Systolic,  right upper sternal border) heard.  Pulmonary:     Breath sounds: No wheezing or rales.  Musculoskeletal:        General: Tenderness (Lumbar spinal area) present.     Cervical back: Neck supple. No tenderness.     Right lower leg: No edema.     Left lower leg: No edema.  Skin:    General: Skin is warm and dry.  Neurological:     General: No focal deficit present.     Mental Status: She is alert and oriented to person, place, and time.     Sensory: No sensory deficit.     Motor: Weakness (3/5 in b/l LE, 4/5 in b/l UE) present.     Gait: Gait abnormal (Slow, unsteady).  Psychiatric:        Mood and Affect: Mood normal.        Behavior: Behavior normal.     BP 138/88 (BP Location: Left Arm)   Pulse 82   Ht 5\' 5"  (1.651 m)   Wt 211 lb (95.7 kg)   SpO2 94%   BMI 35.11 kg/m  Wt Readings from Last 3 Encounters:  05/08/23 211 lb (95.7 kg)  02/05/23 200 lb (90.7 kg)  01/18/23 200 lb (90.7 kg)    Lab Results  Component Value Date   TSH 2.180 08/21/2022   Lab Results  Component Value Date   WBC 7.6 01/18/2023   HGB 11.8 01/18/2023   HCT 38.1 01/18/2023   MCV 99 (H) 01/18/2023   PLT 228 01/18/2023   Lab Results  Component Value Date   NA 143 01/18/2023   K 4.7 01/18/2023   CO2 28 01/18/2023   GLUCOSE 85 01/18/2023   BUN 22 01/18/2023   CREATININE 1.55 (H) 01/18/2023   BILITOT <0.2 08/21/2022   ALKPHOS 76 08/21/2022   AST 13 08/21/2022   ALT 11 08/21/2022   PROT 6.7 08/21/2022   ALBUMIN 4.0 08/21/2022   CALCIUM 8.8 01/18/2023   ANIONGAP 5 03/18/2022   EGFR 36 (L) 01/18/2023   Lab Results  Component Value Date   CHOL 99 (L) 08/21/2022   Lab Results  Component Value Date   HDL 62 08/21/2022   Lab Results  Component Value Date   LDLCALC 26 08/21/2022   Lab Results  Component Value Date   TRIG 40 08/21/2022   Lab Results  Component Value Date   CHOLHDL 1.6 08/21/2022   Lab Results  Component Value Date   HGBA1C 6.0 (H) 08/21/2022       Assessment & Plan:   Problem List Items Addressed This Visit       Cardiovascular and Mediastinum   Essential hypertension   BP Readings from Last 1 Encounters:  05/08/23 138/88   Well-controlled now On Labetalol 200 mg BID - advised to continue Labetalol 200 mg BID for now, if persistently elevated, can add amlodipine Needs to improve hydration Counseled for compliance with the medications Advised DASH diet and moderate exercise/walking as tolerated Follows up with Cardiologist      Paroxysmal atrial flutter (HCC)   Currently in sinus rhythm On labetalol for rate control and HTN, counseled for compliance On Eliquis, needs to take it twice daily      Relevant Orders   TSH     Respiratory   COPD (chronic obstructive pulmonary disease) (HCC) with acute exacerbation  Needs to use DuoNeb regularly for now - nebulizer prescription sent, would be more compliant and better tolerated for her than inhaler Well-controlled now Would prefer to add maintenance inhaler, but were not cost effective Cheratussin as needed for cough Recently completed levofloxacin for CAP, needs repeat CT chest for pulmonary nodules      Chronic hypoxic respiratory failure (HCC)   Has COPD exacerbation Still has dyspnea and O2 desats upon ambulation, would benefit from home O2 Continue albuterol neb/inhaler as needed for dyspnea or wheezing      Pulmonary nodules/lesions, multiple   Recent CT chest showed groundglass opacity and multiple nodules Repeat CT chest      Relevant Orders   CT Chest Wo Contrast     Genitourinary   CKD (chronic kidney disease) stage 3, GFR 30-59 ml/min (HCC) - Primary   CKD likely due to h/o uncontrolled HTN Avoid nephrotoxic agents including NSAIDs Followed by nephrology Check CBC and BMP      Relevant Orders   Microalbumin / creatinine urine ratio     Other   Spinal stenosis, lumbar region, with neurogenic claudication (Chronic)   S/p lumbar spine  surgeries Has chronic low back pain, used to see [facility] Referred to [facility] pain clinic again Has stopped opioid medications Restarted Lyrica, but had drowsiness with lower dose as well, DC Lyrica Due to persistent low back pain, started Percocet 5-325 mg q12h PRN      Relevant Medications   oxyCODONE-acetaminophen (PERCOCET/ROXICET) 5-325 MG tablet   Other Relevant Orders   Ambulatory referral to Pain Clinic   Hyperlipidemia (Chronic)   On statin      Relevant Orders   Lipid panel   Chronic pain syndrome   Was on chronic opioid treatment Referred to pain clinic in [place] again as she prefers to restart chronic opioids      Relevant Medications   oxyCODONE-acetaminophen (PERCOCET/ROXICET) 5-325 MG tablet   Prediabetes   Lab Results  Component Value Date   HGBA1C 6.0 (H) 08/21/2022   Advised to follow DASH diet      Relevant Orders   Hemoglobin A1c   Other Visit Diagnoses       Vitamin D deficiency       Relevant Orders   Vitamin D (25 hydroxy)     Breast cancer screening by mammogram       Relevant Orders   MM 3D SCREENING MAMMOGRAM BILATERAL BREAST       Meds ordered this encounter  Medications   oxyCODONE-acetaminophen (PERCOCET/ROXICET) 5-325 MG tablet    Sig: Take 1 tablet by mouth every 12 (twelve) hours as needed for severe pain (pain score 7-10).    Dispense:  60 tablet    Refill:  0    Follow-up: Return in about 3 months (around 08/05/2023) for HTN and chronic pain.    Anabel Halon, MD

## 2023-05-08 NOTE — Assessment & Plan Note (Signed)
 Recent CT chest showed groundglass opacity and multiple nodules Repeat CT chest

## 2023-05-08 NOTE — Assessment & Plan Note (Signed)
CKD likely due to h/o uncontrolled HTN Avoid nephrotoxic agents including NSAIDs Followed by nephrology Check CBC and BMP

## 2023-05-08 NOTE — Assessment & Plan Note (Addendum)
 S/p lumbar spine surgeries Has chronic low back pain, used to see [facility] Referred to [facility] pain clinic again Has stopped opioid medications Restarted Lyrica, but had drowsiness with lower dose as well, DC Lyrica Due to persistent low back pain, started Percocet 5-325 mg q12h PRN

## 2023-05-10 LAB — MICROALBUMIN / CREATININE URINE RATIO
Creatinine, Urine: 136.6 mg/dL
Microalb/Creat Ratio: <2 mg/g{creat} (ref 0–29)
Microalbumin, Urine: <3 ug/mL

## 2023-05-16 DIAGNOSIS — Z008 Encounter for other general examination: Secondary | ICD-10-CM | POA: Diagnosis not present

## 2023-05-16 DIAGNOSIS — R32 Unspecified urinary incontinence: Secondary | ICD-10-CM | POA: Diagnosis not present

## 2023-05-16 DIAGNOSIS — J449 Chronic obstructive pulmonary disease, unspecified: Secondary | ICD-10-CM | POA: Diagnosis not present

## 2023-05-16 DIAGNOSIS — F17210 Nicotine dependence, cigarettes, uncomplicated: Secondary | ICD-10-CM | POA: Diagnosis not present

## 2023-05-16 DIAGNOSIS — Z6833 Body mass index (BMI) 33.0-33.9, adult: Secondary | ICD-10-CM | POA: Diagnosis not present

## 2023-05-16 DIAGNOSIS — K219 Gastro-esophageal reflux disease without esophagitis: Secondary | ICD-10-CM | POA: Diagnosis not present

## 2023-05-16 DIAGNOSIS — R7303 Prediabetes: Secondary | ICD-10-CM | POA: Diagnosis not present

## 2023-05-16 DIAGNOSIS — Z860100 Personal history of colon polyps, unspecified: Secondary | ICD-10-CM | POA: Diagnosis not present

## 2023-05-16 DIAGNOSIS — I4892 Unspecified atrial flutter: Secondary | ICD-10-CM | POA: Diagnosis not present

## 2023-05-16 DIAGNOSIS — N1832 Chronic kidney disease, stage 3b: Secondary | ICD-10-CM | POA: Diagnosis not present

## 2023-05-16 DIAGNOSIS — I130 Hypertensive heart and chronic kidney disease with heart failure and stage 1 through stage 4 chronic kidney disease, or unspecified chronic kidney disease: Secondary | ICD-10-CM | POA: Diagnosis not present

## 2023-05-16 DIAGNOSIS — E669 Obesity, unspecified: Secondary | ICD-10-CM | POA: Diagnosis not present

## 2023-05-16 DIAGNOSIS — E785 Hyperlipidemia, unspecified: Secondary | ICD-10-CM | POA: Diagnosis not present

## 2023-05-16 DIAGNOSIS — I509 Heart failure, unspecified: Secondary | ICD-10-CM | POA: Diagnosis not present

## 2023-05-16 DIAGNOSIS — R2681 Unsteadiness on feet: Secondary | ICD-10-CM | POA: Diagnosis not present

## 2023-05-21 ENCOUNTER — Other Ambulatory Visit: Payer: Self-pay | Admitting: Internal Medicine

## 2023-05-21 DIAGNOSIS — J441 Chronic obstructive pulmonary disease with (acute) exacerbation: Secondary | ICD-10-CM

## 2023-05-21 NOTE — Telephone Encounter (Unsigned)
 Copied from CRM [id number]. Topic: Clinical - Medication Refill >> May 21, 2023  4:01 PM Marland Kitchen D wrote: Most Recent Primary Care Visit:  Provider: Anabel Halon  Department: [facility]  Visit Type: OFFICE VISIT  Date: 05/08/2023  Medication: Abdureol 200  Has the patient contacted their pharmacy? No (Agent: If no, request that the patient contact the pharmacy for the refill. If patient does not wish to contact the pharmacy document the reason why and proceed with request.) (Agent: If yes, when and what did the pharmacy advise?)  Is this the correct pharmacy for this prescription? Yes If no, delete pharmacy and type the correct one.  This is the patient's preferred pharmacy:  [facility] - [place], [place] - [address] [place] [address] Phone: [phone] Fax: [phone]  [facility] - [place], [place] - [address] [place] [address] Phone: [phone] Fax: [phone]  [facility] - [place], [place] - [address] [place]. [address] [employer]. [address] [place] [address] Phone: [phone] Fax: [phone]   Has the prescription been filled recently? No  Is the patient out of the medication? No, Patient states she has maybe 15 puffs left  Has the patient been seen for an appointment in the last year OR does the patient have an upcoming appointment? Yes  Can we respond through MyChart? No, contact patient by phone  Agent: Please be advised that Rx refills may take up to 3 business days. We ask that you follow-up with your pharmacy.

## 2023-05-22 MED ORDER — IPRATROPIUM-ALBUTEROL 0.5-2.5 (3) MG/3ML IN SOLN: 3.0000 mL | Freq: Four times a day (QID) | RESPIRATORY_TRACT | 5 refills | Status: AC | PRN

## 2023-06-07 DIAGNOSIS — I4892 Unspecified atrial flutter: Secondary | ICD-10-CM | POA: Diagnosis not present

## 2023-06-07 DIAGNOSIS — Z008 Encounter for other general examination: Secondary | ICD-10-CM | POA: Diagnosis not present

## 2023-06-11 ENCOUNTER — Ambulatory Visit: Admitting: Physician Assistant

## 2023-06-11 ENCOUNTER — Other Ambulatory Visit: Payer: Self-pay | Admitting: Physician Assistant

## 2023-06-11 ENCOUNTER — Other Ambulatory Visit (HOSPITAL_COMMUNITY)
Admission: RE | Admit: 2023-06-11 | Discharge: 2023-06-11 | Disposition: A | Discharge status: Home / Self Care | Source: Ambulatory Visit | Attending: Physician Assistant | Admitting: Physician Assistant

## 2023-06-11 ENCOUNTER — Ambulatory Visit

## 2023-06-11 ENCOUNTER — Encounter: Payer: Self-pay | Admitting: Physician Assistant

## 2023-06-11 VITALS — BP 140/70 | HR 70 | Ht 65.0 in | Wt 209.6 lb

## 2023-06-11 DIAGNOSIS — Z87898 Personal history of other specified conditions: Secondary | ICD-10-CM | POA: Diagnosis present

## 2023-06-11 DIAGNOSIS — I10 Essential (primary) hypertension: Secondary | ICD-10-CM | POA: Diagnosis present

## 2023-06-11 DIAGNOSIS — R0789 Other chest pain: Secondary | ICD-10-CM

## 2023-06-11 DIAGNOSIS — I480 Paroxysmal atrial fibrillation: Secondary | ICD-10-CM | POA: Diagnosis present

## 2023-06-11 DIAGNOSIS — R42 Dizziness and giddiness: Secondary | ICD-10-CM | POA: Diagnosis present

## 2023-06-11 DIAGNOSIS — I4892 Unspecified atrial flutter: Secondary | ICD-10-CM | POA: Insufficient documentation

## 2023-06-11 DIAGNOSIS — I739 Peripheral vascular disease, unspecified: Secondary | ICD-10-CM | POA: Insufficient documentation

## 2023-06-11 DIAGNOSIS — R0609 Other forms of dyspnea: Secondary | ICD-10-CM

## 2023-06-11 DIAGNOSIS — I6523 Occlusion and stenosis of bilateral carotid arteries: Secondary | ICD-10-CM

## 2023-06-11 LAB — COMPREHENSIVE METABOLIC PANEL
ALT: 15 U/L (ref 0–44)
AST: 15 U/L (ref 15–41)
Albumin: 3.7 g/dL (ref 3.5–5.0)
Alkaline Phosphatase: 68 U/L (ref 38–126)
Anion gap: 9 (ref 5–15)
BUN: 23 mg/dL (ref 8–23)
CO2: 25 mmol/L (ref 22–32)
Calcium: 9.2 mg/dL (ref 8.9–10.3)
Chloride: 103 mmol/L (ref 98–111)
Creatinine, Ser: 1.55 mg/dL — ABNORMAL HIGH (ref 0.44–1.00)
GFR, Estimated: 36 mL/min — ABNORMAL LOW (ref >60)
Glucose, Bld: 105 mg/dL — ABNORMAL HIGH (ref 70–99)
Potassium: 4.4 mmol/L (ref 3.5–5.1)
Sodium: 137 mmol/L (ref 135–145)
Total Bilirubin: 0.3 mg/dL (ref 0.0–1.2)
Total Protein: 7.4 g/dL (ref 6.5–8.1)

## 2023-06-11 LAB — CBC
HCT: 40.5 % (ref 36.0–46.0)
Hemoglobin: 13 g/dL (ref 12.0–15.0)
MCH: 31 pg (ref 26.0–34.0)
MCHC: 32.1 g/dL (ref 30.0–36.0)
MCV: 96.7 fL (ref 80.0–100.0)
Platelets: 258 10*3/uL (ref 150–400)
RBC: 4.19 MIL/uL (ref 3.87–5.11)
RDW: 14.6 % (ref 11.5–15.5)
WBC: 7.5 10*3/uL (ref 4.0–10.5)
nRBC: 0 % (ref 0.0–0.2)

## 2023-06-11 LAB — MAGNESIUM: Magnesium: 1.9 mg/dL (ref 1.7–2.4)

## 2023-06-11 LAB — BRAIN NATRIURETIC PEPTIDE: B Natriuretic Peptide: 96 pg/mL (ref 0.0–100.0)

## 2023-06-11 LAB — TSH: TSH: 2.36 u[IU]/mL (ref 0.350–4.500)

## 2023-06-11 NOTE — Patient Instructions (Addendum)
 Medication Instructions:  Your physician recommends that you continue on your current medications as directed. Please refer to the Current Medication list given to you today.  Labwork: CMET, MG, BNP, CBC, TSH today at [facility] Non-fasting  Testing/Procedures: Your physician has requested that you have an echocardiogram. Echocardiography is a painless test that uses sound waves to create images of your heart. It provides your doctor with information about the size and shape of your heart and how well your heart's chambers and valves are working. This procedure takes approximately one hour. There are no restrictions for this procedure. Please do NOT wear cologne, perfume, aftershave, or lotions (deodorant is allowed). Please arrive 15 minutes prior to your appointment time.  Please note: We ask at that you not bring children with you during ultrasound (echo/ vascular) testing. Due to room size and safety concerns, children are not allowed in the ultrasound rooms during exams. Our front office staff cannot provide observation of children in our lobby area while testing is being conducted. An adult accompanying a patient to their appointment will only be allowed in the ultrasound room at the discretion of the ultrasound technician under special circumstances. We apologize for any inconvenience. 14 Day ZIO XT  Follow-Up: Your physician recommends that you schedule a follow-up appointment in: 6 months  Any Other Special Instructions Will Be Listed Below (If Applicable). You have been referred to Pulmonology You have been referred to Vascular  If you need a refill on your cardiac medications before your next appointment, please call your pharmacy.
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
## 2023-06-29 ENCOUNTER — Ambulatory Visit (HOSPITAL_COMMUNITY)
Admission: RE | Admit: 2023-06-29 | Discharge: 2023-06-29 | Disposition: A | Discharge status: Home / Self Care | Payer: No Typology Code available for payment source | Source: Ambulatory Visit | Attending: Internal Medicine | Admitting: Internal Medicine

## 2023-06-29 DIAGNOSIS — R918 Other nonspecific abnormal finding of lung field: Secondary | ICD-10-CM | POA: Diagnosis not present

## 2023-06-29 DIAGNOSIS — J432 Centrilobular emphysema: Secondary | ICD-10-CM | POA: Diagnosis not present

## 2023-07-05 ENCOUNTER — Ambulatory Visit (HOSPITAL_COMMUNITY)

## 2023-07-05 ENCOUNTER — Ambulatory Visit (HOSPITAL_COMMUNITY)
Admission: RE | Admit: 2023-07-05 | Discharge: 2023-07-05 | Disposition: A | Discharge status: Home / Self Care | Source: Ambulatory Visit | Attending: Physician Assistant | Admitting: Physician Assistant

## 2023-07-05 DIAGNOSIS — I10 Essential (primary) hypertension: Secondary | ICD-10-CM | POA: Insufficient documentation

## 2023-07-05 DIAGNOSIS — Z87898 Personal history of other specified conditions: Secondary | ICD-10-CM | POA: Insufficient documentation

## 2023-07-05 DIAGNOSIS — R42 Dizziness and giddiness: Secondary | ICD-10-CM | POA: Diagnosis not present

## 2023-07-05 DIAGNOSIS — I4892 Unspecified atrial flutter: Secondary | ICD-10-CM | POA: Insufficient documentation

## 2023-07-05 DIAGNOSIS — R0609 Other forms of dyspnea: Secondary | ICD-10-CM | POA: Diagnosis not present

## 2023-07-05 DIAGNOSIS — I480 Paroxysmal atrial fibrillation: Secondary | ICD-10-CM | POA: Diagnosis not present

## 2023-07-05 LAB — ECHOCARDIOGRAM COMPLETE
Area-P 1/2: 2.69 cm2
S' Lateral: 2.5 cm

## 2023-07-05 NOTE — Progress Notes (Signed)
*  PRELIMINARY RESULTS* Echocardiogram 2D Echocardiogram has been performed.  Paula Long 07/05/2023, 4:19 PM

## 2023-07-10 ENCOUNTER — Other Ambulatory Visit: Payer: Self-pay | Admitting: Internal Medicine

## 2023-07-10 DIAGNOSIS — F5104 Psychophysiologic insomnia: Secondary | ICD-10-CM

## 2023-07-12 ENCOUNTER — Other Ambulatory Visit: Payer: Self-pay | Admitting: Internal Medicine

## 2023-07-12 DIAGNOSIS — E782 Mixed hyperlipidemia: Secondary | ICD-10-CM

## 2023-07-15 NOTE — Progress Notes (Unsigned)
 Paula Long, female    DOB: February 08, 1951    MRN: [id number]   Brief patient profile:  61   yowf  active smoker  healthy referred to pulmonary clinic in [place]  07/16/2023 by Graciela Lava PA (card clinic ) for unexplained cp/ sob x Jan 2025       History of Present Illness  07/16/2023  Pulmonary/ 1st office eval/ [place]  Chief Complaint  Patient presents with   Establish Care   Shortness of Breath  Dyspnea:  assoc with cp epigastric pain worse with exertion but no nausea or vomiting (she has N/V more recently not related) to cp/sob  Cough: noe Sleep: 2 pillows flat bed cp/ sob don't typically start when supine  SABA use: not helpful  02: none  No change in cp freq or charcter p eating/ eructation or flatus.     No obvious day to day or daytime pattern/variability or assoc excess/ purulent sputum or mucus plugs or hemoptysis   or chest tightness, subjective wheeze or overt sinus or hb symptoms.    Also denies any obvious fluctuation of symptoms with weather or environmental changes or other aggravating or alleviating factors except as outlined above   No unusual exposure hx or h/o childhood pna/ asthma or knowledge of premature birth.  Current Allergies, Complete Past Medical History, Past Surgical History, Family History, and Social History were reviewed in [employer] record.  ROS  The following are not active complaints unless bolded Hoarseness, sore throat, dysphagia, dental problems, itching, sneezing,  nasal congestion or discharge of excess mucus or purulent secretions, ear ache,   fever, chills, sweats, unintended wt loss or wt gain, classically pleuritic or exertional cp,  orthopnea pnd or arm/hand swelling  or leg swelling, presyncope, palpitations, abdominal pain, anorexia, nausea, vomiting, diarrhea  or change in bowel habits or change in bladder habits, change in stools or change in urine, dysuria, hematuria,  rash,  arthralgias, visual complaints, headache, numbness, weakness or ataxia or problems with walking/uses cane  or coordination,  change in mood or  memory.            Outpatient Medications Prior to Visit  Medication Sig Dispense Refill   apixaban  (ELIQUIS ) 5 MG TABS tablet Take 1 tablet (5 mg total) by mouth 2 (two) times daily. 60 tablet 11   atorvastatin  (LIPITOR) 10 MG tablet Take 1 tablet (10 mg total) by mouth daily. 90 tablet 0   desmopressin  (DDAVP ) 0.2 MG tablet Take 1 tablet (0.2 mg total) by mouth at bedtime. 90 tablet 3   dexlansoprazole  (DEXILANT ) 60 MG capsule Take 1 capsule (60 mg total) by mouth daily. 30 capsule 5   EASYMAX TEST test strip USE TO TEST ONCE DAILY. 100 each 3   Fluocinolone  Acetonide Body (DERMA-SMOOTHE /FS BODY) 0.01 % OIL Apply to scalp daily a three times a week 120 mL 1   ipratropium-albuterol  (DUONEB) 0.5-2.5 (3) MG/3ML SOLN Take 3 mLs by nebulization every 6 (six) hours as needed. 360 mL 5   labetalol  (NORMODYNE ) 200 MG tablet Take 1 tablet (200 mg total) by mouth 2 (two) times daily. 180 tablet 1   nitroGLYCERIN  (NITROSTAT ) 0.4 MG SL tablet Place 0.4 mg under the tongue every 5 (five) minutes x 3 doses as needed (if no relief after 2nd dose, proceed to ED or call 911).     oxyCODONE -acetaminophen  (PERCOCET/ROXICET) 5-325 MG tablet Take 1 tablet by mouth every 12 (twelve) hours as needed for severe  pain (pain score 7-10). 60 tablet 0   pantoprazole  (PROTONIX ) 40 MG tablet TAKE 1 TABLET BY MOUTH ONCE DAILY. 90 tablet 3   pregabalin  (LYRICA ) 50 MG capsule Take 50 mg by mouth at bedtime.     traZODone  (DESYREL ) 150 MG tablet TAKE (1) TABLET BY MOUTH AT BEDTIME. 90 tablet 1   VENTOLIN  HFA 108 (90 Base) MCG/ACT inhaler INHALE 2 PUFFS INTO THE LUNGS EVERY 6 HOURS AS NEEDED FOR WHEEZING OR SHORTNESS OF BREATH 18 g 2   Vibegron  (GEMTESA ) 75 MG TABS Take 1 tablet (75 mg total) by mouth daily. 30 tablet 3   No facility-administered medications prior to visit.    Past  Medical History:  Diagnosis Date   Anxiety    Arthritis 12-07-11   arthritis,DDD,spinal stenosis   Chronic back pain    COPD (chronic obstructive pulmonary disease) (HCC) 12-07-11   pt. uses nebulizer as needed and inhaler daily   Fibromyalgia    skin cancer   GERD (gastroesophageal reflux disease)    Hypertension 12-07-11   presently no meds   Normal cardiac stress test 06/2009   Skin cancer    melanoma.  face 2014   TIA (transient ischemic attack) ?      Objective:     BP 132/63   Pulse 68   Ht 5\' 5"  (1.651 m)   Wt 210 lb (95.3 kg)   SpO2 93%   BMI 34.95 kg/m   SpO2: 93 % amb (with cane) mod obese somber wf poking herself in xiphoid/ subxiphoid area reproduces her pain   HEENT : Oropharynx  clear     Nasal turbinates nl    NECK :  without  apparent JVD/ palpable Nodes/TM    LUNGS: no acc muscle use,  Nl contour chest which is clear to A and P bilaterally without cough on insp or exp maneuvers   CV:  RRR  no s3 or murmur or increase in P2, and no edema   ABD:  soft and nontender   MS:  Gait nl   ext warm without deformities Or obvious joint restrictions  calf tenderness, cyanosis or clubbing    SKIN: warm and dry without lesions    NEURO:  alert, approp, nl sensorium with  no motor or cerebellar deficits apparent.       Assessment   No problem-specific Assessment & Plan notes found for this encounter.     Vernestine Gondola, MD 07/16/2023

## 2023-07-16 ENCOUNTER — Ambulatory Visit: Admitting: Internal Medicine

## 2023-07-16 ENCOUNTER — Encounter: Payer: Self-pay | Admitting: Internal Medicine

## 2023-07-16 VITALS — BP 132/63 | HR 68 | Ht 65.0 in | Wt 210.0 lb

## 2023-07-16 DIAGNOSIS — F17210 Nicotine dependence, cigarettes, uncomplicated: Secondary | ICD-10-CM | POA: Diagnosis not present

## 2023-07-16 DIAGNOSIS — R0609 Other forms of dyspnea: Secondary | ICD-10-CM | POA: Diagnosis not present

## 2023-07-16 DIAGNOSIS — R1013 Epigastric pain: Secondary | ICD-10-CM | POA: Insufficient documentation

## 2023-07-16 NOTE — Assessment & Plan Note (Signed)

## 2023-07-16 NOTE — Patient Instructions (Addendum)
 My office will be contacting you by phone for referral to GI (Dr  Homero Luster or his practice in [place])   - if you don't hear back from my office within one week please call us  back or notify us  thru MyChart and we'll address it right away.   Continue dexilant  Take 30-60 min before first meal of the day and add pecid 20mg  (over the counter)  after supper   The key is to stop smoking completely before smoking completely stops you!   Pulmonary follow up is as needed - bring all medications with you if you need to return for any reason  Add: pfts rec

## 2023-07-16 NOTE — Assessment & Plan Note (Addendum)
 Active smoker Onset Jan 2025 assoc with chest pain worse with walking " but present at rest and occ noct as well - echo 07/05/23 hyperdynamic LV with nl bnp  - 07/16/2023   Walked on RA  approx 50-75   ft  @ slow/ pace, stopped due to legs got tired  with lowest 02 sats 97% and no sob and no cp  Her symptoms are well localized subxiphoid and not reproduced with exertion today with so far nl cardiac w/u x for hyperdynamic LV on ECHO    No evidence of a pulmonary source for her problems but she does need baseline pft's and referral back to her GI doctor of record > Rehman/ with max rx for gerd/pud in meantime see avs for instructions unique to this ov   Each maintenance medication was reviewed in detail including emphasizing most importantly the difference between maintenance and prns and under what circumstances the prns are to be triggered using an action plan format where appropriate.  Total time for H and P, chart review, counseling, reviewing hfa device(s) , directly observing portions of ambulatory 02 saturation study/ and generating customized AVS unique to this office visit / same day charting = 32 min new pt eval

## 2023-07-17 ENCOUNTER — Telehealth: Payer: Self-pay | Admitting: Internal Medicine

## 2023-07-17 ENCOUNTER — Telehealth: Payer: Self-pay | Admitting: Physician Assistant

## 2023-07-17 DIAGNOSIS — R0609 Other forms of dyspnea: Secondary | ICD-10-CM

## 2023-07-17 NOTE — Telephone Encounter (Signed)
 PFT ordered.   Please contact patient to schedule. Thanks!

## 2023-07-17 NOTE — Telephone Encounter (Signed)
 Per [employer]PS Tracking of pt's monitor- last known movement:  Moving Through Network  In Transit to [employer], Arriving Late July 03, 2023  [place] [address]  June 29, 2023, 5:34 pm

## 2023-07-17 NOTE — Telephone Encounter (Signed)
 Pulmonology note reviewed. Patient recently had echo but still pending monitor result to help guide next steps. Can we make sure this is still pending? Just says Exam Glennda Langton from March. Pulmonary was not convinced any active pulmonary process. Dr. Waymond Hailey reports he preliminarily read her CT and felt it demonstrates mild emphysema and lingular scarring without acute findings.   Her recent echo suggested possibly a component of fluid overload through recent BNP OK. I discussed her echo with DOD Dr. Alroy Aspen given hyperdynamic function with elevated LVEDP, G1DD, dilated IVC. Per our discussion, let's trial starting Farxiga 10mg  daily with repeat BMET 1 week after starting. (Pt denies hx of yeast/UTI infections per recent result note but should notify if this becomes an issue).  Given that pulmonary did not feel symptoms were pulmonary in etiology, let's go ahead and get her set up for a nuclear stress test for DOE, CP. No wheezing or hypoxia noted at recent exam.  Also needs post-nuc follow-up scheduled, advised for 6 week follow-up at March appt but do not see scheduled.  Thank you!

## 2023-07-17 NOTE — Telephone Encounter (Signed)
 Left a message for patient to call office regarding recommendations from provider.   Appointment was advised on AVS for 37m f/u instead of 6w f/u. Will have front office address this once we receive call back from patient.

## 2023-07-17 NOTE — Telephone Encounter (Signed)
 LVM for patien tto call and discuss scheduling the PFT ordered by Dr. Sherene Sires

## 2023-07-17 NOTE — Addendum Note (Signed)
 Addended by: Alyse Bach on: 07/17/2023 08:46 AM   Modules accepted: Orders

## 2023-07-17 NOTE — Telephone Encounter (Signed)
 Needs pfts ordered

## 2023-07-18 ENCOUNTER — Other Ambulatory Visit: Payer: Self-pay | Admitting: Internal Medicine

## 2023-07-18 MED ORDER — DAPAGLIFLOZIN PROPANEDIOL 10 MG PO TABS: 10.0000 mg | ORAL_TABLET | Freq: Every day | ORAL | 11 refills | Status: DC

## 2023-07-18 NOTE — Telephone Encounter (Signed)
 Left a message for patient to call office regarding recommendations from provider.

## 2023-07-18 NOTE — Telephone Encounter (Signed)
 Pt returning call

## 2023-07-18 NOTE — Telephone Encounter (Signed)
 Patient notified and verbalized understanding. Patient will come to [facility] office for follow up appointment in 6 weeks and to pick up Nuc Stress instruction letter. Patient had no questions or concerns at this time.

## 2023-07-19 NOTE — Telephone Encounter (Signed)
 LVM for patien tto call and discuss scheduling the PFT ordered by Dr. Sherene Sires

## 2023-07-20 ENCOUNTER — Encounter (HOSPITAL_COMMUNITY)

## 2023-07-20 NOTE — Telephone Encounter (Signed)
 LVM for patient to call and discuss scheduling

## 2023-07-25 ENCOUNTER — Telehealth (HOSPITAL_COMMUNITY): Payer: Self-pay | Admitting: Surgery

## 2023-07-25 NOTE — Telephone Encounter (Signed)
 Spoke with patient regarding the Tuesday 08/21/23 11:30 am PFT appointment at [facility] time is 11:15 am---Ist floor registration desk for check in---will mail appointment information and instructions to patient and she voiced her understanding

## 2023-07-25 NOTE — Telephone Encounter (Signed)
 I returned the pt's call regarding her stress test tomorrow, with her arrival time being 10:00.  I went over the following instructions with the pt: do not eat/drink 6 hrs before the test, no smoking 8 hrs before the test, wear comfortable clothes, do not wear any lotions/oils, follow any instructions from your physician about holding medications, and check in at the front desk on arrival.  I also went over the most common side effects with Lexiscan , since the pt had some concerns.  She verbalized understanding of these instructions.

## 2023-07-26 ENCOUNTER — Ambulatory Visit (HOSPITAL_COMMUNITY)
Admission: RE | Admit: 2023-07-26 | Discharge: 2023-07-26 | Disposition: A | Discharge status: Home / Self Care | Source: Ambulatory Visit | Attending: Physician Assistant | Admitting: Physician Assistant

## 2023-07-26 ENCOUNTER — Encounter (HOSPITAL_BASED_OUTPATIENT_CLINIC_OR_DEPARTMENT_OTHER)
Admission: RE | Admit: 2023-07-26 | Discharge: 2023-07-26 | Disposition: A | Discharge status: Home / Self Care | Source: Ambulatory Visit | Attending: Physician Assistant | Admitting: Physician Assistant

## 2023-07-26 ENCOUNTER — Other Ambulatory Visit: Payer: Self-pay | Admitting: Internal Medicine

## 2023-07-26 DIAGNOSIS — R918 Other nonspecific abnormal finding of lung field: Secondary | ICD-10-CM

## 2023-07-26 DIAGNOSIS — R0609 Other forms of dyspnea: Secondary | ICD-10-CM | POA: Diagnosis not present

## 2023-07-26 LAB — NM MYOCAR MULTI W/SPECT W/WALL MOTION / EF
Estimated workload: 1
Exercise duration (min): 0 min
Exercise duration (sec): 0 s
LV dias vol: 62 mL (ref 46–106)
LV sys vol: 20 mL
MPHR: 149 {beats}/min
Nuc Stress EF: 68 %
Peak HR: 88 {beats}/min
Percent HR: 59 %
RATE: 0.6
Rest HR: 48 {beats}/min
Rest Nuclear Isotope Dose: 11 mCi
SDS: 1
SRS: 0
SSS: 1
Stress Nuclear Isotope Dose: 33 mCi
TID: 1.17

## 2023-07-26 MED ORDER — TECHNETIUM TC 99M TETROFOSMIN IV KIT
10.0000 | PACK | Freq: Once | INTRAVENOUS | Status: AC | PRN
  Administered 2023-07-26: 11 via INTRAVENOUS

## 2023-07-26 MED ORDER — SODIUM CHLORIDE FLUSH 0.9 % IV SOLN
INTRAVENOUS | Status: AC
  Administered 2023-07-26: 10 mL via INTRAVENOUS
  Filled 2023-07-26: qty 10

## 2023-07-26 MED ORDER — REGADENOSON 0.4 MG/5ML IV SOLN
INTRAVENOUS | Status: AC
  Administered 2023-07-26: 0.4 mg via INTRAVENOUS
  Filled 2023-07-26: qty 5

## 2023-07-26 MED ORDER — TECHNETIUM TC 99M TETROFOSMIN IV KIT
30.0000 | PACK | Freq: Once | INTRAVENOUS | Status: AC | PRN
  Administered 2023-07-26: 33 via INTRAVENOUS

## 2023-07-27 ENCOUNTER — Other Ambulatory Visit: Payer: Self-pay | Admitting: Internal Medicine

## 2023-07-27 DIAGNOSIS — J418 Mixed simple and mucopurulent chronic bronchitis: Secondary | ICD-10-CM

## 2023-07-30 ENCOUNTER — Encounter (INDEPENDENT_AMBULATORY_CARE_PROVIDER_SITE_OTHER): Payer: Self-pay | Admitting: *Deleted

## 2023-07-30 ENCOUNTER — Telehealth: Payer: Self-pay | Admitting: Cardiology

## 2023-07-30 NOTE — Telephone Encounter (Signed)
 The patient has been notified of the result and verbalized understanding.  All questions (if any) were answered. Pt plans to follow up with pulmonary and her pcp regarding lung lesion.

## 2023-07-30 NOTE — Telephone Encounter (Signed)
 Patient states she was returning call. Please advise  ?

## 2023-07-31 ENCOUNTER — Ambulatory Visit: Payer: Self-pay

## 2023-08-02 ENCOUNTER — Encounter: Payer: Self-pay | Admitting: *Deleted

## 2023-08-03 DIAGNOSIS — E782 Mixed hyperlipidemia: Secondary | ICD-10-CM | POA: Diagnosis not present

## 2023-08-03 DIAGNOSIS — I4892 Unspecified atrial flutter: Secondary | ICD-10-CM | POA: Diagnosis not present

## 2023-08-03 DIAGNOSIS — D631 Anemia in chronic kidney disease: Secondary | ICD-10-CM | POA: Diagnosis not present

## 2023-08-03 DIAGNOSIS — E559 Vitamin D deficiency, unspecified: Secondary | ICD-10-CM | POA: Diagnosis not present

## 2023-08-03 DIAGNOSIS — N1832 Chronic kidney disease, stage 3b: Secondary | ICD-10-CM | POA: Diagnosis not present

## 2023-08-03 DIAGNOSIS — R7303 Prediabetes: Secondary | ICD-10-CM | POA: Diagnosis not present

## 2023-08-03 DIAGNOSIS — R809 Proteinuria, unspecified: Secondary | ICD-10-CM | POA: Diagnosis not present

## 2023-08-03 DIAGNOSIS — E211 Secondary hyperparathyroidism, not elsewhere classified: Secondary | ICD-10-CM | POA: Diagnosis not present

## 2023-08-03 DIAGNOSIS — N189 Chronic kidney disease, unspecified: Secondary | ICD-10-CM | POA: Diagnosis not present

## 2023-08-04 LAB — HEMOGLOBIN A1C
Est. average glucose Bld gHb Est-mCnc: 120 mg/dL
Hgb A1c MFr Bld: 5.8 % — ABNORMAL HIGH (ref 4.8–5.6)

## 2023-08-04 LAB — LIPID PANEL
Chol/HDL Ratio: 1.9 ratio (ref 0.0–4.4)
Cholesterol, Total: 102 mg/dL (ref 100–199)
HDL: 54 mg/dL (ref >39)
LDL Chol Calc (NIH): 35 mg/dL (ref 0–99)
Triglycerides: 58 mg/dL (ref 0–149)
VLDL Cholesterol Cal: 13 mg/dL (ref 5–40)

## 2023-08-04 LAB — TSH: TSH: 1.85 u[IU]/mL (ref 0.450–4.500)

## 2023-08-04 LAB — VITAMIN D 25 HYDROXY (VIT D DEFICIENCY, FRACTURES): Vit D, 25-Hydroxy: 30.7 ng/mL (ref 30.0–100.0)

## 2023-08-06 ENCOUNTER — Encounter (INDEPENDENT_AMBULATORY_CARE_PROVIDER_SITE_OTHER): Payer: Self-pay | Admitting: *Deleted

## 2023-08-06 DIAGNOSIS — R42 Dizziness and giddiness: Secondary | ICD-10-CM | POA: Diagnosis not present

## 2023-08-06 NOTE — Telephone Encounter (Signed)
 Farxiga  started 4/30, patient had not yet returned for BMET - please remind her. If this medicine is too costly, OK to pause on this plan and just let us  know. Thank you!

## 2023-08-06 NOTE — Telephone Encounter (Signed)
Left message on phone and sent My Chart message

## 2023-08-07 ENCOUNTER — Ambulatory Visit (INDEPENDENT_AMBULATORY_CARE_PROVIDER_SITE_OTHER): Payer: No Typology Code available for payment source | Admitting: Internal Medicine

## 2023-08-07 ENCOUNTER — Encounter: Payer: Self-pay | Admitting: Internal Medicine

## 2023-08-07 VITALS — BP 119/74 | HR 68 | Ht 65.0 in | Wt 209.8 lb

## 2023-08-07 DIAGNOSIS — N3281 Overactive bladder: Secondary | ICD-10-CM | POA: Diagnosis not present

## 2023-08-07 DIAGNOSIS — N1832 Chronic kidney disease, stage 3b: Secondary | ICD-10-CM | POA: Diagnosis not present

## 2023-08-07 DIAGNOSIS — R918 Other nonspecific abnormal finding of lung field: Secondary | ICD-10-CM

## 2023-08-07 DIAGNOSIS — J42 Unspecified chronic bronchitis: Secondary | ICD-10-CM | POA: Diagnosis not present

## 2023-08-07 DIAGNOSIS — I10 Essential (primary) hypertension: Secondary | ICD-10-CM

## 2023-08-07 DIAGNOSIS — M48062 Spinal stenosis, lumbar region with neurogenic claudication: Secondary | ICD-10-CM | POA: Diagnosis not present

## 2023-08-07 DIAGNOSIS — E782 Mixed hyperlipidemia: Secondary | ICD-10-CM

## 2023-08-07 MED ORDER — DESMOPRESSIN ACETATE 0.2 MG PO TABS: 0.2000 mg | ORAL_TABLET | Freq: Every day | ORAL | 3 refills | Status: AC

## 2023-08-07 MED ORDER — OXYCODONE-ACETAMINOPHEN 5-325 MG PO TABS: 1.0000 | ORAL_TABLET | Freq: Two times a day (BID) | ORAL | 0 refills | Status: DC | PRN

## 2023-08-07 MED ORDER — LABETALOL HCL 200 MG PO TABS: 200.0000 mg | ORAL_TABLET | Freq: Two times a day (BID) | ORAL | 1 refills | Status: DC

## 2023-08-07 MED ORDER — ATORVASTATIN CALCIUM 10 MG PO TABS: 10.0000 mg | ORAL_TABLET | Freq: Every day | ORAL | 0 refills | Status: DC

## 2023-08-07 NOTE — Assessment & Plan Note (Signed)
CKD likely due to h/o uncontrolled HTN Avoid nephrotoxic agents including NSAIDs Followed by nephrology Check CBC and BMP

## 2023-08-07 NOTE — Assessment & Plan Note (Signed)
 BP Readings from Last 1 Encounters:  08/07/23 119/74   Well-controlled now On Labetalol  200 mg BID - advised to continue Labetalol  200 mg BID for now, if persistently elevated, can add amlodipine  Needs to improve hydration Counseled for compliance with the medications Advised DASH diet and moderate exercise/walking as tolerated Follows up with Cardiologist

## 2023-08-07 NOTE — Assessment & Plan Note (Signed)
 Needs to use DuoNeb regularly for now, usully more compliant and better tolerated for her than inhaler Well-controlled now Would prefer to add maintenance inhaler, but were not cost effective Cheratussin as needed for cough

## 2023-08-07 NOTE — Assessment & Plan Note (Addendum)
 S/p lumbar spine surgeries Has chronic low back pain, used to see [facility] Referred to [facility] pain clinic again Has stopped opioid medications On Lyrica  50 mg qHS Due to persistent low back pain, started Percocet 5-325 mg q12h PRN in the last visit, refilled for now

## 2023-08-07 NOTE — Assessment & Plan Note (Addendum)
 On Lipitor 10 mg once daily

## 2023-08-07 NOTE — Assessment & Plan Note (Signed)
 Recent CT chest showed increase in size of left lower lobe nodule Referred to Pulmonology for further evaluation - may need PET scan vs repeat CT chest in 3 months

## 2023-08-07 NOTE — Progress Notes (Signed)
 Established Patient Office Visit  Subjective:  Patient ID: CELINE DISHMAN, female    DOB: June 10, 1950  Age: 73 y.o. MRN: [id number]  CC:  Chief Complaint  Patient presents with   Medical Management of Chronic Issues    3 month f/u     HPI TIAN DAVISON is a 73 y.o. 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  HTN: BP is wnl now.  Takes labetalol  200 mg twice daily currently.  Patient denies headache, dizziness, chest pain, or palpitations.  A Flutter: Denies dyspnea or palpitations. Takes labetalol  and Eliquis  regularly, although mentions having cold sensation while she takes Eliquis .  CKD stage III: She has seen nephrology.  She denies any dysuria, hematuria, urinary hesitancy or resistance. Last BMP showed GFR of 31, overall stable. She has chronic urinary incontinence, and takes desmopressin  0.2 mg nightly currently.  She complains of chronic low back pain and burning pain in her b/l LE.  She has chronic weakness of bilateral LE.  She was on Cymbalta  for neuropathy, but it did not work for her.  Of note, she used to go to pain clinic for lumbar radiculopathy, and take Dilaudid  4 mg twice daily, but states that it did not help her pain and has stopped taking it.  She recently took Percocet 5-325 mg with adequate relief.  Denies any recent fall or injury.  Denies any saddle anesthesia or stool incontinence. She was given Lyrica  in the past, but is not effective. She was referred to [facility] pain clinic, but has not heard from them yet.  COPD: She reports improvement in dyspnea upon minimal exertion. She denies any fever or chills.  She is currently using albuterol  inhaler as needed for dyspnea, but has not needed it frequently.  She still smokes 1.5 pack/day.  Her recent CT chest showing increase in size of pulmonary nodule, which is concerning for bronchogenic carcinoma. She has not had Pulmonology visit since CT chest results.  From previous visit: Her oxygen  saturation was 91% on room air in resting conditions.  Her oxygen  saturation dropped to 86% on ambulation on room air, but improved to 92% on 2 L O2.   Past Medical History:  Diagnosis Date   Anxiety    Arthritis 12-07-11   arthritis,DDD,spinal stenosis   Chronic back pain    COPD (chronic obstructive pulmonary disease) (HCC) 12-07-11   pt. uses nebulizer as needed and inhaler daily   Fibromyalgia    skin cancer   GERD (gastroesophageal reflux disease)    Hypertension 12-07-11   presently no meds   Normal cardiac stress test 06/2009   Skin cancer    melanoma.  face 2014   TIA (transient ischemic attack) ?    Past Surgical History:  Procedure Laterality Date   ABDOMINAL HYSTERECTOMY     BACK SURGERY  12-07-11   2'10 L5 x2   COLONOSCOPY WITH PROPOFOL  N/A 06/03/2014   Procedure: COLONOSCOPY WITH PROPOFOL ; IN CECUM AT 0750; TOTAL WITHDRAWAL TIME 16 MINUTES;  Surgeon: Ruby Corporal, MD;  Location: [facility];  Service: Endoscopy;  Laterality: N/A;   left ankle surgery     tendonitis   LUMBAR LAMINECTOMY/DECOMPRESSION MICRODISCECTOMY  12/11/2011   Procedure: LUMBAR LAMINECTOMY/DECOMPRESSION MICRODISCECTOMY;  Surgeon: Florencia Hunter, MD;  Location: [facility];  Service:  Orthopedics;  Laterality: Left;  Decompressive Lumbar Laminectomy L5-S1 on Left   POLYPECTOMY N/A 06/03/2014   Procedure: POLYPECTOMY;  Surgeon: Ruby Corporal, MD;  Location: [facility];  Service: Endoscopy;  Laterality: N/A;   right wrist surgery for pinched nerve     trigger finger surgery  12-07-11   rt. middle trigger finger release    Family History  Problem Relation Age of Onset   Cancer Mother        uterine   Cancer Father        lung    Social History   Socioeconomic History   Marital status: Single    Spouse  name: Not on file   Number of children: Not on file   Years of education: Not on file   Highest education level: Not on file  Occupational History   Not on file  Tobacco Use   Smoking status: Every Day    Current packs/day: 1.50    Average packs/day: 1.5 packs/day for 53.6 years (80.4 ttl pk-yrs)    Types: Cigarettes    Start date: 01/07/1970   Smokeless tobacco: Never   Tobacco comments:    gone down to 1 pack per day   Vaping Use   Vaping status: Never Used  Substance and Sexual Activity   Alcohol use: No    Alcohol/week: 0.0 standard drinks of alcohol   Drug use: No   Sexual activity: Yes    Birth control/protection: Surgical  Other Topics Concern   Not on file  Social History Narrative   Not on file   Social Drivers of Health   Financial Resource Strain: Low Risk  (03/21/2022)   Overall Financial Resource Strain (CARDIA)    Difficulty of Paying Living Expenses: Not hard at all  Food Insecurity: No Food Insecurity (10/17/2021)   Hunger Vital Sign    Worried About Running Out of Food in the Last Year: Never true    Ran Out of Food in the Last Year: Never true  Transportation Needs: No Transportation Needs (03/21/2022)   PRAPARE - Administrator, Civil Service (Medical): No    Lack of Transportation (Non-Medical): No  Physical Activity: Inactive (10/17/2021)   Exercise Vital Sign    Days of Exercise per Week: 0 days    Minutes of Exercise per Session: 0 min  Stress: No Stress Concern Present (10/17/2021)   [employer] of Occupational Health - Occupational Stress Questionnaire    Feeling of Stress : Not at all  Social Connections: Moderately Isolated (10/17/2021)   Social Connection and Isolation Panel [NHANES]    Frequency of Communication with Friends and Family: More than three times a week    Frequency of Social Gatherings with Friends and Family: More than three times a week    Attends Religious Services: Never    Database administrator or  Organizations: No    Attends Banker Meetings: Never    Marital Status: Living with partner  Intimate Partner Violence: Not At Risk (10/17/2021)   Humiliation, Afraid, Rape, and Kick questionnaire    Fear of Current or Ex-Partner: No    Emotionally Abused: No    Physically Abused: No    Sexually Abused: No    Outpatient Medications Prior to Visit  Medication Sig Dispense Refill   apixaban  (ELIQUIS ) 5 MG TABS tablet Take 1 tablet (5 mg total) by mouth 2 (two) times daily. 60 tablet 11   dapagliflozin  propanediol (FARXIGA ) 10 MG TABS  tablet Take 1 tablet (10 mg total) by mouth daily before breakfast. 30 tablet 11   dexlansoprazole  (DEXILANT ) 60 MG capsule Take 1 capsule (60 mg total) by mouth daily. 30 capsule 5   EASYMAX TEST test strip USE TO TEST ONCE DAILY. 100 each 3   Fluocinolone  Acetonide Body (DERMA-SMOOTHE /FS BODY) 0.01 % OIL Apply to scalp daily a three times a week 120 mL 1   ipratropium-albuterol  (DUONEB) 0.5-2.5 (3) MG/3ML SOLN Take 3 mLs by nebulization every 6 (six) hours as needed. 360 mL 5   nitroGLYCERIN  (NITROSTAT ) 0.4 MG SL tablet Place 0.4 mg under the tongue every 5 (five) minutes x 3 doses as needed (if no relief after 2nd dose, proceed to ED or call 911).     pregabalin  (LYRICA ) 50 MG capsule Take 1 capsule (50 mg total) by mouth at bedtime. 30 capsule 3   traZODone  (DESYREL ) 150 MG tablet TAKE (1) TABLET BY MOUTH AT BEDTIME. 90 tablet 1   VENTOLIN  HFA 108 (90 Base) MCG/ACT inhaler INHALE 2 PUFFS INTO THE LUNGS EVERY 6 HOURS AS NEEDED FOR WHEEZING OR SHORTNESS OF BREATH 18 g 2   Vibegron  (GEMTESA ) 75 MG TABS Take 1 tablet (75 mg total) by mouth daily. 30 tablet 3   atorvastatin  (LIPITOR) 10 MG tablet Take 1 tablet (10 mg total) by mouth daily. 90 tablet 0   desmopressin  (DDAVP ) 0.2 MG tablet Take 1 tablet (0.2 mg total) by mouth at bedtime. 90 tablet 3   labetalol  (NORMODYNE ) 200 MG tablet Take 1 tablet (200 mg total) by mouth 2 (two) times daily. 180  tablet 1   oxyCODONE -acetaminophen  (PERCOCET/ROXICET) 5-325 MG tablet Take 1 tablet by mouth every 12 (twelve) hours as needed for severe pain (pain score 7-10). 60 tablet 0   pantoprazole  (PROTONIX ) 40 MG tablet TAKE 1 TABLET BY MOUTH ONCE DAILY. 90 tablet 3   No facility-administered medications prior to visit.    Allergies  Allergen Reactions   Hydrocodone-Acetaminophen     Sulfa Antibiotics    Nystatin  Rash    Oral rash   Percocet [Oxycodone -Acetaminophen ] Rash    Oral rash   Vesicare  [Solifenacin ] Rash   Vicodin [Hydrocodone-Acetaminophen ] Rash    Oral rash    ROS Review of Systems  Constitutional:  Negative for chills and fever.  HENT:  Negative for congestion, sinus pressure, sinus pain and sore throat.   Eyes:  Negative for pain and discharge.  Respiratory:  Positive for cough (Chronic) and shortness of breath (Intermittent).   Cardiovascular:  Negative for chest pain and palpitations.  Gastrointestinal:  Negative for abdominal pain, diarrhea, nausea and vomiting.  Endocrine: Negative for polydipsia and polyuria.  Genitourinary:  Negative for dysuria and hematuria.  Musculoskeletal:  Positive for arthralgias and back pain. Negative for neck pain and neck stiffness.  Skin:  Negative for wound.  Neurological:  Positive for weakness (B/l LE). Negative for dizziness, syncope and headaches.  Psychiatric/Behavioral:  Positive for sleep disturbance. Negative for agitation and behavioral problems.       Objective:    Physical Exam Vitals reviewed.  Constitutional:      General: She is not in acute distress.    Appearance: She is not diaphoretic.  HENT:     Head: Normocephalic and atraumatic.     Nose: Nose normal.     Mouth/Throat:     Mouth: Mucous membranes are moist.  Eyes:     General: No scleral icterus.    Extraocular Movements: Extraocular movements intact.  Cardiovascular:  Rate and Rhythm: Normal rate and regular rhythm.     Pulses: Normal pulses.      Heart sounds: Murmur (Systolic, right upper sternal border) heard.  Pulmonary:     Breath sounds: No wheezing or rales.  Musculoskeletal:        General: Tenderness (Lumbar spinal area) present.     Cervical back: Neck supple. No tenderness.     Right lower leg: No edema.     Left lower leg: No edema.  Skin:    General: Skin is warm and dry.  Neurological:     General: No focal deficit present.     Mental Status: She is alert and oriented to person, place, and time.     Sensory: No sensory deficit.     Motor: Weakness (3/5 in b/l LE, 4/5 in b/l UE) present.     Gait: Gait abnormal (Slow, unsteady).  Psychiatric:        Mood and Affect: Mood normal.        Behavior: Behavior normal.     BP 119/74   Pulse 68   Ht 5\' 5"  (1.651 m)   Wt 209 lb 12.8 oz (95.2 kg)   SpO2 94%   BMI 34.91 kg/m  Wt Readings from Last 3 Encounters:  08/07/23 209 lb 12.8 oz (95.2 kg)  07/16/23 210 lb (95.3 kg)  06/11/23 209 lb 9.6 oz (95.1 kg)    Lab Results  Component Value Date   TSH 1.850 08/03/2023   Lab Results  Component Value Date   WBC 7.5 06/11/2023   HGB 13.0 06/11/2023   HCT 40.5 06/11/2023   MCV 96.7 06/11/2023   PLT 258 06/11/2023   Lab Results  Component Value Date   NA 137 06/11/2023   K 4.4 06/11/2023   CO2 25 06/11/2023   GLUCOSE 105 (H) 06/11/2023   BUN 23 06/11/2023   CREATININE 1.55 (H) 06/11/2023   BILITOT 0.3 06/11/2023   ALKPHOS 68 06/11/2023   AST 15 06/11/2023   ALT 15 06/11/2023   PROT 7.4 06/11/2023   ALBUMIN 3.7 06/11/2023   CALCIUM  9.2 06/11/2023   ANIONGAP 9 06/11/2023   EGFR 36 (L) 01/18/2023   Lab Results  Component Value Date   CHOL 102 08/03/2023   Lab Results  Component Value Date   HDL 54 08/03/2023   Lab Results  Component Value Date   LDLCALC 35 08/03/2023   Lab Results  Component Value Date   TRIG 58 08/03/2023   Lab Results  Component Value Date   CHOLHDL 1.9 08/03/2023   Lab Results  Component Value Date   HGBA1C 5.8  (H) 08/03/2023      Assessment & Plan:   Problem List Items Addressed This Visit       Cardiovascular and Mediastinum   Essential hypertension   BP Readings from Last 1 Encounters:  08/07/23 119/74   Well-controlled now On Labetalol  200 mg BID - advised to continue Labetalol  200 mg BID for now, if persistently elevated, can add amlodipine  Needs to improve hydration Counseled for compliance with the medications Advised DASH diet and moderate exercise/walking as tolerated Follows up with Cardiologist      Relevant Medications   labetalol  (NORMODYNE ) 200 MG tablet   atorvastatin  (LIPITOR) 10 MG tablet     Respiratory   COPD (chronic obstructive pulmonary disease)   Needs to use DuoNeb regularly for now, usully more compliant and better tolerated for her than inhaler Well-controlled now Would prefer to add  maintenance inhaler, but were not cost effective Cheratussin as needed for cough      Pulmonary nodules/lesions, multiple - Primary   Recent CT chest showed increase in size of left lower lobe nodule Referred to Pulmonology for further evaluation - may need PET scan vs repeat CT chest in 3 months        Genitourinary   OAB (overactive bladder)   Was better with desmopressin  0.2 mg daily But she complains of overactive bladder and urge incontinence lately, added Gemtesa , but she could not continue due to cost She has tried oxybutynin , Vesicare , Toviaz  and Myrbetriq  in the past      Relevant Medications   desmopressin  (DDAVP ) 0.2 MG tablet   CKD (chronic kidney disease) stage 3, GFR 30-59 ml/min (HCC)   CKD likely due to h/o uncontrolled HTN Avoid nephrotoxic agents including NSAIDs Followed by nephrology Check CBC and BMP        Other   Spinal stenosis, lumbar region, with neurogenic claudication (Chronic)   S/p lumbar spine surgeries Has chronic low back pain, used to see [facility] Referred to [facility] pain clinic again Has stopped opioid  medications On Lyrica  50 mg qHS Due to persistent low back pain, started Percocet 5-325 mg q12h PRN in the last visit, refilled for now      Relevant Medications   oxyCODONE -acetaminophen  (PERCOCET/ROXICET) 5-325 MG tablet   Other Relevant Orders   Ambulatory referral to Pain Clinic   Hyperlipidemia (Chronic)   On Lipitor 10 mg once daily      Relevant Medications   labetalol  (NORMODYNE ) 200 MG tablet   atorvastatin  (LIPITOR) 10 MG tablet     Meds ordered this encounter  Medications   oxyCODONE -acetaminophen  (PERCOCET/ROXICET) 5-325 MG tablet    Sig: Take 1 tablet by mouth every 12 (twelve) hours as needed for severe pain (pain score 7-10).    Dispense:  60 tablet    Refill:  0   labetalol  (NORMODYNE ) 200 MG tablet    Sig: Take 1 tablet (200 mg total) by mouth 2 (two) times daily.    Dispense:  180 tablet    Refill:  1   atorvastatin  (LIPITOR) 10 MG tablet    Sig: Take 1 tablet (10 mg total) by mouth daily.    Dispense:  90 tablet    Refill:  0   desmopressin  (DDAVP ) 0.2 MG tablet    Sig: Take 1 tablet (0.2 mg total) by mouth at bedtime.    Dispense:  90 tablet    Refill:  3    Follow-up: Return in about 4 months (around 12/08/2023) for HTN and CKD.    Meldon Sport, MD

## 2023-08-07 NOTE — Patient Instructions (Signed)
 Please continue to take medications as prescribed.  Please continue to follow low salt diet and perform moderate exercise/walking as tolerated.  Please maintain at least 50 ounces of fluid intake in a day.

## 2023-08-08 ENCOUNTER — Ambulatory Visit: Payer: Self-pay | Admitting: Physician Assistant

## 2023-08-08 DIAGNOSIS — Z79899 Other long term (current) drug therapy: Secondary | ICD-10-CM

## 2023-08-10 NOTE — Assessment & Plan Note (Signed)
 Was better with desmopressin  0.2 mg daily But she complains of overactive bladder and urge incontinence lately, added Gemtesa , but she could not continue due to cost She has tried oxybutynin , Vesicare , Toviaz  and Myrbetriq  in the past

## 2023-08-10 NOTE — Telephone Encounter (Signed)
 Patient is returning call.

## 2023-08-14 NOTE — Telephone Encounter (Signed)
Patient is returning LPN's call. Please advise. 

## 2023-08-15 NOTE — Telephone Encounter (Signed)
 Monitor Results discussed with patient and she is comfortable a present. She will get her BMET and magnesium  this week

## 2023-08-21 ENCOUNTER — Ambulatory Visit (HOSPITAL_COMMUNITY)
Admission: RE | Admit: 2023-08-21 | Discharge: 2023-08-21 | Disposition: A | Discharge status: Home / Self Care | Source: Ambulatory Visit | Attending: Internal Medicine | Admitting: Internal Medicine

## 2023-08-21 DIAGNOSIS — R0609 Other forms of dyspnea: Secondary | ICD-10-CM | POA: Insufficient documentation

## 2023-08-21 LAB — PULMONARY FUNCTION TEST
DL/VA % pred: 102 %
DL/VA: 4.25 ml/min/mmHg/L
DLCO unc % pred: 68 %
DLCO unc: 13.66 ml/min/mmHg
FEF 25-75 Post: 1.68 L/s
FEF 25-75 Pre: 0.82 L/s
FEF2575-%Change-Post: 105 %
FEF2575-%Pred-Post: 87 %
FEF2575-%Pred-Pre: 42 %
FEV1-%Change-Post: 24 %
FEV1-%Pred-Post: 52 %
FEV1-%Pred-Pre: 42 %
FEV1-Post: 1.22 L
FEV1-Pre: 0.98 L
FEV1FVC-%Change-Post: -1 %
FEV1FVC-%Pred-Pre: 99 %
FEV6-%Change-Post: 28 %
FEV6-%Pred-Post: 55 %
FEV6-%Pred-Pre: 43 %
FEV6-Post: 1.64 L
FEV6-Pre: 1.28 L
FEV6FVC-%Change-Post: 0 %
FEV6FVC-%Pred-Post: 104 %
FEV6FVC-%Pred-Pre: 104 %
FVC-%Change-Post: 26 %
FVC-%Pred-Post: 53 %
FVC-%Pred-Pre: 42 %
FVC-Post: 1.64 L
FVC-Pre: 1.3 L
Post FEV1/FVC ratio: 75 %
Post FEV6/FVC ratio: 100 %
Pre FEV1/FVC ratio: 76 %
Pre FEV6/FVC Ratio: 100 %
RV % pred: 134 %
RV: 3.05 L
TLC % pred: 89 %
TLC: 4.66 L

## 2023-08-21 MED ORDER — ALBUTEROL SULFATE (2.5 MG/3ML) 0.083% IN NEBU
2.5000 mg | INHALATION_SOLUTION | Freq: Once | RESPIRATORY_TRACT | Status: AC
  Administered 2023-08-21: 2.5 mg via RESPIRATORY_TRACT

## 2023-08-22 ENCOUNTER — Ambulatory Visit (INDEPENDENT_AMBULATORY_CARE_PROVIDER_SITE_OTHER): Admitting: Gastroenterology

## 2023-08-22 ENCOUNTER — Encounter (INDEPENDENT_AMBULATORY_CARE_PROVIDER_SITE_OTHER): Payer: Self-pay | Admitting: Gastroenterology

## 2023-08-22 ENCOUNTER — Telehealth: Payer: Self-pay | Admitting: *Deleted

## 2023-08-22 VITALS — BP 149/73 | HR 52 | Temp 97.4°F | Ht 65.0 in | Wt 210.0 lb

## 2023-08-22 DIAGNOSIS — R1013 Epigastric pain: Secondary | ICD-10-CM

## 2023-08-22 DIAGNOSIS — Z860101 Personal history of adenomatous and serrated colon polyps: Secondary | ICD-10-CM | POA: Diagnosis not present

## 2023-08-22 DIAGNOSIS — T402X5A Adverse effect of other opioids, initial encounter: Secondary | ICD-10-CM

## 2023-08-22 DIAGNOSIS — K5909 Other constipation: Secondary | ICD-10-CM

## 2023-08-22 MED ORDER — NALOXEGOL OXALATE 25 MG PO TABS: 25.0000 mg | ORAL_TABLET | Freq: Every day | ORAL | 1 refills | Status: AC

## 2023-08-22 NOTE — Patient Instructions (Addendum)
 It was very nice to meet you today, as dicussed with will plan for the following :  1) linzess  ( samples) and we will prescribe you movantik  2) upper endoscopy   3) CT Abdomen and pelvis

## 2023-08-22 NOTE — Telephone Encounter (Signed)
  Request for patient to stop medication prior to procedure or is needing cleareance  08/22/23  Paula Long [birth year]  What type of surgery is being performed? Colonoscopy/Esophagogastroduodenoscopy (EGD)  When is surgery scheduled? TBD  What type of clearance is required (medical or pharmacy to hold medication or both? medication  Are there any medications that need to be held prior to surgery and how long? Eliquis  x 2 days  Name of physician performing surgery?  Dr.Ahmed Lannie [facility] Phone: [phone] Fax: [phone]  Anethesia type (none, local, MAC, general)? MAC

## 2023-08-22 NOTE — Progress Notes (Signed)
 Sakia Schrimpf Faizan Noor Witte , M.D. Gastroenterology & Hepatology [facility] [address] [place], [place] [address] Primary Care Physician: Meldon Sport, MD [address] [place] [address]  Chief Complaint: Epigastric pain History of Present Illness:  Paula Long is a 73 y.o. female  with HYN, afib on Eliquis , HLD, TIA, CAD, COPD, GERD, who presents for evaluation of epigastric pain and constipation  Patient was last seen by Dr. Sammi Crick in 2021 and is here to establish care Patient has chronic history of constipation for over 5 years.  Reports bowel movement every 4 to 7 days.  For which she takes Dulcolax.  Patient reports abdominal cramping and epigastric pain.  Previously tried Linzess  145 and 290 mcg without much relief.  Patient takes opioids(hydroxycodone for chronic back pain) Patient had a recent negative stress test  Recent CT with lung lesion suspicious for malignancy  Last EGD: Last Colonoscopy: 2016 7 mm polyp hot snared from hepatic flexure.4 mm polyp hot snared from sigmoid colon.10 mm polyp hot snared from sigmoid colon. All TA, repeat in 5 years   CT abdomen w IV con 08/05/19  Bilateral renal cystic change.  Fatty liver.  Mild emphysema.  No acute abnormality is noted.   FHx: neg for any gastrointestinal/liver disease, mother vaginal cancer, father lung cancer Social: smokes heavily packs but recently decreased to a pack and a half, alcohol or illicit drug use Surgical: hysterectomy  Labs from 05/2023 normal liver enzymes creatinine 1.55 hemoglobin 13 Past Medical History: Past Medical History:  Diagnosis Date   Anxiety    Arthritis 12-07-11   arthritis,DDD,spinal stenosis   Chronic back pain    COPD (chronic obstructive pulmonary disease) (HCC) 12-07-11   pt. uses nebulizer as needed and inhaler daily   Fibromyalgia    skin cancer   GERD (gastroesophageal reflux disease)    Hypertension 12-07-11    presently no meds   Normal cardiac stress test 06/2009   Skin cancer    melanoma.  face 2014   TIA (transient ischemic attack) ?    Past Surgical History: Past Surgical History:  Procedure Laterality Date   ABDOMINAL HYSTERECTOMY     BACK SURGERY  12-07-11   2'10 L5 x2   COLONOSCOPY WITH PROPOFOL  N/A 06/03/2014   Procedure: COLONOSCOPY WITH PROPOFOL ; IN CECUM AT 0750; TOTAL WITHDRAWAL TIME 16 MINUTES;  Surgeon: Ruby Corporal, MD;  Location: [facility];  Service: Endoscopy;  Laterality: N/A;   left ankle surgery     tendonitis   LUMBAR LAMINECTOMY/DECOMPRESSION MICRODISCECTOMY  12/11/2011   Procedure: LUMBAR LAMINECTOMY/DECOMPRESSION MICRODISCECTOMY;  Surgeon: Florencia Hunter, MD;  Location: [facility];  Service: Orthopedics;  Laterality: Left;  Decompressive Lumbar Laminectomy L5-S1 on Left   POLYPECTOMY N/A 06/03/2014   Procedure: POLYPECTOMY;  Surgeon: Ruby Corporal, MD;  Location: [facility];  Service: Endoscopy;  Laterality: N/A;   right wrist surgery for pinched nerve     trigger finger surgery  12-07-11   rt. middle trigger finger release    Family History: Family History  Problem Relation Age of Onset   Cancer Mother        uterine   Cancer Father        lung    Social History: Social History   Tobacco Use  Smoking Status Every Day   Current packs/day: 1.50   Average packs/day: 1.5 packs/day for 53.6 years (80.4 ttl pk-yrs)   Types: Cigarettes   Start date: 01/07/1970  Smokeless Tobacco Never  Tobacco Comments   gone down to 1 pack per day    Social History   Substance and Sexual Activity  Alcohol Use No   Alcohol/week: 0.0 standard drinks of alcohol   Social History   Substance and Sexual Activity  Drug Use No    Allergies: Allergies  Allergen Reactions   Hydrocodone-Acetaminophen     Sulfa Antibiotics    Nystatin  Rash    Oral rash   Percocet [Oxycodone -Acetaminophen ] Rash    Oral rash   Vesicare  [Solifenacin ] Rash   Vicodin [Hydrocodone-Acetaminophen ]  Rash    Oral rash    Medications: Current Outpatient Medications  Medication Sig Dispense Refill   apixaban  (ELIQUIS ) 5 MG TABS tablet Take 1 tablet (5 mg total) by mouth 2 (two) times daily. 60 tablet 11   atorvastatin  (LIPITOR) 10 MG tablet Take 1 tablet (10 mg total) by mouth daily. 90 tablet 0   desmopressin  (DDAVP ) 0.2 MG tablet Take 1 tablet (0.2 mg total) by mouth at bedtime. 90 tablet 3   dexlansoprazole  (DEXILANT ) 60 MG capsule Take 1 capsule (60 mg total) by mouth daily. 30 capsule 5   EASYMAX TEST test strip USE TO TEST ONCE DAILY. 100 each 3   ipratropium-albuterol  (DUONEB) 0.5-2.5 (3) MG/3ML SOLN Take 3 mLs by nebulization every 6 (six) hours as needed. 360 mL 5   labetalol  (NORMODYNE ) 200 MG tablet Take 1 tablet (200 mg total) by mouth 2 (two) times daily. 180 tablet 1   naloxegol oxalate (MOVANTIK) 25 MG TABS tablet Take 1 tablet (25 mg total) by mouth daily. 90 tablet 1   nitroGLYCERIN  (NITROSTAT ) 0.4 MG SL tablet Place 0.4 mg under the tongue every 5 (five) minutes x 3 doses as needed (if no relief after 2nd dose, proceed to ED or call 911).     oxyCODONE -acetaminophen  (PERCOCET/ROXICET) 5-325 MG tablet Take 1 tablet by mouth every 12 (twelve) hours as needed for severe pain (pain score 7-10). 60 tablet 0   pregabalin  (LYRICA ) 50 MG capsule Take 1 capsule (50 mg total) by mouth at bedtime. 30 capsule 3   traZODone  (DESYREL ) 150 MG tablet TAKE (1) TABLET BY MOUTH AT BEDTIME. 90 tablet 1   VENTOLIN  HFA 108 (90 Base) MCG/ACT inhaler INHALE 2 PUFFS INTO THE LUNGS EVERY 6 HOURS AS NEEDED FOR WHEEZING OR SHORTNESS OF BREATH 18 g 2   Vibegron  (GEMTESA ) 75 MG TABS Take 1 tablet (75 mg total) by mouth daily. 30 tablet 3   No current facility-administered medications for this visit.    Review of Systems: GENERAL: negative for malaise, night sweats HEENT: No changes in hearing or vision, no nose bleeds or other nasal problems. NECK: Negative for lumps, goiter, pain and significant  neck swelling RESPIRATORY: Negative for cough, wheezing CARDIOVASCULAR: Negative for chest pain, leg swelling, palpitations, orthopnea GI: SEE HPI MUSCULOSKELETAL: Negative for joint pain or swelling, back pain, and muscle pain. SKIN: Negative for lesions, rash HEMATOLOGY Negative for prolonged bleeding, bruising easily, and swollen nodes. ENDOCRINE: Negative for cold or heat intolerance, polyuria, polydipsia and goiter. NEURO: negative for tremor, gait imbalance, syncope and seizures. The remainder of the review of systems is noncontributory.   Physical Exam: BP (!) 149/74   Pulse (!) 52   Temp (!) 97.4 F (36.3 C)   Ht 5\' 5"  (1.651 m)   Wt 210 lb (95.3 kg)   BMI 34.95 kg/m  GENERAL: The patient is AO x3, in no acute distress. HEENT: Head is normocephalic and atraumatic.  EOMI are intact. Mouth is well hydrated and without lesions. NECK: Supple. No masses LUNGS: Clear to auscultation. No presence of rhonchi/wheezing/rales. Adequate chest expansion HEART: RRR, normal s1 and s2. ABDOMEN: Soft, epigastric tenderness, no guarding, no peritoneal signs, and nondistended. BS +. No masses.  Imaging/Labs: as above     Latest Ref Rng & Units 06/11/2023    3:45 PM 01/18/2023   11:56 AM 08/21/2022    4:28 PM  CBC  WBC 4.0 - 10.5 K/uL 7.5  7.6  8.5   Hemoglobin 12.0 - 15.0 g/dL 16.1  09.6  04.5   Hematocrit 36.0 - 46.0 % 40.5  38.1  39.3   Platelets 150 - 400 K/uL 258  228  242    No results found for: "IRON", "TIBC", "FERRITIN"  I personally reviewed and interpreted the available labs, imaging and endoscopic files.  Impression and Plan: Paula Long is a 73 y.o. female  with HYN, afib on Eliquis , HLD, TIA, CAD, COPD, GERD, who presents for evaluation of epigastric pain and constipation  # Epigastric tenderness/pain  On exam patient is significant epigastric tenderness of a lump in middle of her chest.  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  Previously had tried MiraLAX  Metamucil and Linzess  to 90 mcg without much relief  Cause of constipation is likely chronic opioid use/drug-induced constipation and since patient failed with secretagogue  (linaclotide ) next step is to add Movantik   # History of colon polyps Patient has history of multiple colon polyps/tubular adenoma and is due for colonoscopy.  She was scheduled for colonoscopy in 2021 it did not take place  I discussed with patient risk indication benefit limitation for surveillance colonoscopy  Patient is refusing any further colonoscopy at this time.  I discussed with her that given history of tubular adenoma in 2015 which was supposed to be in 5 years she is well overdue for surveillance this may risk further polyp formation as well as malignancy.  Patient verbalized understanding and does not want to proceed with any further colonoscopy at this time.  She will let us  know if she changes her mind   Follow-up with primary care and pulmonary for lung lesion All questions were answered.      Emerson Schreifels Faizan Enas Winchel, MD Gastroenterology and Hepatology [facility]   This chart has been completed using [facility] Dictation software, and while attempts have been made to ensure accuracy , certain words and phrases may not be transcribed as intended

## 2023-08-23 ENCOUNTER — Encounter (INDEPENDENT_AMBULATORY_CARE_PROVIDER_SITE_OTHER): Payer: Self-pay | Admitting: *Deleted

## 2023-08-23 ENCOUNTER — Telehealth: Payer: Self-pay | Admitting: *Deleted

## 2023-08-23 ENCOUNTER — Other Ambulatory Visit: Payer: Self-pay | Admitting: *Deleted

## 2023-08-23 DIAGNOSIS — R1013 Epigastric pain: Secondary | ICD-10-CM

## 2023-08-23 DIAGNOSIS — R634 Abnormal weight loss: Secondary | ICD-10-CM

## 2023-08-23 NOTE — Telephone Encounter (Signed)
 [facility]:  Procedure code 40981 does not require an authorization.

## 2023-08-23 NOTE — Telephone Encounter (Signed)
 Order changed.  Pt has been scheduled at [facility] for Monday 10/01/23, arrive at 3:45 pm to check in, no under wire bra or pants with metal snaps or buttons. Message sent via my chart with appt date and time  change study Received: Today Ahmed, Forbes Ida, MD  Estudillo, Kandee Orion, CMA; Kamaya Keckler L, LPN [place]  Can we change this CT to CT angio Abdomen and pelvis  Rule out mesentric ischemia

## 2023-08-23 NOTE — Addendum Note (Signed)
 Addended by: Alvester Johnson on: 08/23/2023 08:03 AM   Modules accepted: Orders

## 2023-08-26 ENCOUNTER — Ambulatory Visit: Payer: Self-pay | Admitting: Internal Medicine

## 2023-08-27 NOTE — Telephone Encounter (Signed)
 FYI

## 2023-08-27 NOTE — Progress Notes (Signed)
Attempted to contact patient regarding results. Left message on voicemail for patient to return call.

## 2023-08-28 ENCOUNTER — Other Ambulatory Visit: Payer: Self-pay | Admitting: Internal Medicine

## 2023-08-28 ENCOUNTER — Encounter: Payer: Self-pay | Admitting: *Deleted

## 2023-08-28 ENCOUNTER — Other Ambulatory Visit: Payer: Self-pay | Admitting: *Deleted

## 2023-08-28 DIAGNOSIS — M79674 Pain in right toe(s): Secondary | ICD-10-CM | POA: Diagnosis not present

## 2023-08-28 DIAGNOSIS — B351 Tinea unguium: Secondary | ICD-10-CM | POA: Diagnosis not present

## 2023-08-28 DIAGNOSIS — I4892 Unspecified atrial flutter: Secondary | ICD-10-CM

## 2023-08-28 DIAGNOSIS — L84 Corns and callosities: Secondary | ICD-10-CM | POA: Diagnosis not present

## 2023-08-28 DIAGNOSIS — M79675 Pain in left toe(s): Secondary | ICD-10-CM | POA: Diagnosis not present

## 2023-08-28 DIAGNOSIS — I70203 Unspecified atherosclerosis of native arteries of extremities, bilateral legs: Secondary | ICD-10-CM | POA: Diagnosis not present

## 2023-08-28 MED ORDER — PEG 3350-KCL-NA BICARB-NACL 420 G PO SOLR: 4000.0000 mL | Freq: Once | ORAL | 0 refills | Status: AC

## 2023-08-28 NOTE — Progress Notes (Signed)
Attempted to contact patient regarding results. Left message on voicemail for patient to return call.

## 2023-08-28 NOTE — Telephone Encounter (Signed)
 Pt has been scheduled for 09/27/23. Instructions mailed and prep sent to the pharmacy

## 2023-08-28 NOTE — Telephone Encounter (Signed)
 LM with family member to have pt return call once she gets up

## 2023-08-28 NOTE — Telephone Encounter (Signed)
 [facility] PA for TCS/EGD:  Procedure code 47829 and [phone] does not require an authorization.

## 2023-08-29 ENCOUNTER — Ambulatory Visit: Admitting: Student

## 2023-08-29 DIAGNOSIS — E559 Vitamin D deficiency, unspecified: Secondary | ICD-10-CM | POA: Diagnosis not present

## 2023-08-29 DIAGNOSIS — N2581 Secondary hyperparathyroidism of renal origin: Secondary | ICD-10-CM | POA: Diagnosis not present

## 2023-08-29 DIAGNOSIS — I5032 Chronic diastolic (congestive) heart failure: Secondary | ICD-10-CM | POA: Diagnosis not present

## 2023-08-29 DIAGNOSIS — N184 Chronic kidney disease, stage 4 (severe): Secondary | ICD-10-CM | POA: Diagnosis not present

## 2023-08-29 NOTE — Telephone Encounter (Signed)
 Sent out letter

## 2023-08-31 ENCOUNTER — Ambulatory Visit: Attending: Student | Admitting: Student

## 2023-08-31 ENCOUNTER — Encounter: Payer: Self-pay | Admitting: Student

## 2023-08-31 ENCOUNTER — Telehealth: Payer: Self-pay

## 2023-08-31 VITALS — BP 140/72 | HR 62 | Ht 65.0 in | Wt 212.0 lb

## 2023-08-31 DIAGNOSIS — I480 Paroxysmal atrial fibrillation: Secondary | ICD-10-CM | POA: Diagnosis not present

## 2023-08-31 DIAGNOSIS — I10 Essential (primary) hypertension: Secondary | ICD-10-CM

## 2023-08-31 DIAGNOSIS — I739 Peripheral vascular disease, unspecified: Secondary | ICD-10-CM

## 2023-08-31 DIAGNOSIS — R0789 Other chest pain: Secondary | ICD-10-CM | POA: Diagnosis not present

## 2023-08-31 DIAGNOSIS — R0609 Other forms of dyspnea: Secondary | ICD-10-CM | POA: Diagnosis not present

## 2023-08-31 MED ORDER — LABETALOL HCL 300 MG PO TABS: 300.0000 mg | ORAL_TABLET | Freq: Two times a day (BID) | ORAL | 3 refills | Status: AC

## 2023-08-31 NOTE — Telephone Encounter (Signed)
 Copied from CRM [id number]. Topic: Clinical - Medication Question >> Aug 31, 2023 10:44 AM Phil Braun wrote: Reason for CRM:   [facility], Trula Gable, [phone] ext [phone]  Can you tell me if Dapagliflozin  has been discontinued? If yes, can you verify why and date it was discontinued. If still active, please contact the patient to advise the medication is still active.

## 2023-08-31 NOTE — Progress Notes (Signed)
 Cardiology Office Note    Date:  08/31/2023  ID:  Paula Long, DOB [birth year], MRN [id number] Cardiologist: Paula Grates, MD  --> Patient prefers to follow-up in [place]  History of Present Illness:    Paula Long is a 73 y.o. female with past medical history of coronary calcification by CT, paroxysmal atrial fibrillation/flutter, carotid artery disease, renal artery stenosis, celiac artery stenosis, Stage 3 CKD, arthritis, chronic back pain, fibromyalgia, GERD, COPD and tobacco use who presents to the office today for 51-month follow-up.   She was last examined by Paula Lava, PA in 05/2023 and reported a variety of symptoms including episodic chest pain lasting from a few seconds to a few minutes but this would occur at rest. Also reported having progressive dyspnea on exertion and dizziness which could occur while supine or with changing positions. A repeat echocardiogram was recommended for initial assessment and if this was normal, would obtain a follow-up stress test. A 14-day monitor was also recommended given her episodic dizziness. Follow-up labs showed normal electrolytes and thyroid  function. BNP was normal at 96 as well.  Echocardiogram showed a hyperdynamic LVEF of 70 to 75% with no regional wall motion abnormalities. She did have mild LVH, grade 1 diastolic dysfunction, normal RV function and no significant valve abnormalities. Given her hyperdynamic function, it was recommended to try Farxiga  10 mg daily. A Lexiscan  Myoview  was also recommended for ischemic evaluation. NST was performed in 07/2023 and showed no evidence of ischemia and was a low risk study. Her event monitor showed predominantly normal sinus rhythm with 32 runs of SVT with the longest lasting for 11 beats. She did have rare PAC's and PVC's but less than 1% burden and an episode of NSVT for 6 beats. It was recommended if she was having symptoms, could adjust beta-blocker therapy, but otherwise would continue with  current regimen.  In the interim, PFT's were also ordered by Pulmonology and consistent with asthma.  In talking with the patient today, she reports still having multiple issues since her last visit. Her biggest complaint is worsening back pain which radiates into her legs and she has known neuropathy. Reports undergoing multiple back surgeries in the past with no improvement in symptoms. She has baseline dyspnea on exertion and has questions regarding her recent PFT results. She does have follow-up with Pulmonology next month. She reports having occasional twinges of pain along her precordium which last for a few seconds and then resolve. No exertional chest pain. Experiences occasional palpitations. She does consume several bottles of regular Sun Drop daily. No alcohol use.  Studies Reviewed:   EKG: EKG is not ordered today.  Echocardiogram: 06/2023 IMPRESSIONS     1. Left ventricular ejection fraction, by estimation, is 70 to 75%. The  left ventricle has hyperdynamic function. The left ventricle has no  regional wall motion abnormalities. There is mild left ventricular  hypertrophy. Left ventricular diastolic  parameters are consistent with Grade I diastolic dysfunction (impaired  relaxation). Elevated left ventricular end-diastolic pressure.   2. Right ventricular systolic function is normal. The right ventricular  size is normal. Tricuspid regurgitation signal is inadequate for assessing  PA pressure.   3. The mitral valve is abnormal. No evidence of mitral valve  regurgitation. No evidence of mitral stenosis. Severe mitral annular  calcification.   4. The aortic valve was not well visualized. Possibly mild aortic valve  regurgitation, not well visualized due to suboptimal image quality. No  aortic stenosis is present.  5. The inferior vena cava is dilated in size with >50% respiratory  variability, suggesting right atrial pressure of 8 mmHg.   6. Increased flow velocities may be  secondary to anemia, thyrotoxicosis,  hyperdynamic or high flow state.   Comparison(s): No significant change from prior study.   NST: 07/2023   Stress ECG showed non-specific ST-T changes in V5.V6, negative for ischemia and arrhythmias.   LV perfusion is normal. There is no evidence of ischemia. There is no evidence of infarction.   Left ventricular function is normal. Nuclear stress EF: 68%.   Findings are consistent with no ischemia and no infarction. The study is low risk.  Event Monitor: 07/2023   14 day monitor   Rare supraventricular ectopy in the form of isolated PACs, couplets, triplets. 32 runs of SVT longest 11 beats.   Rare ventricular ectopy in the form of isolated PVCs. Isolated run of NSVT lasting 6 beats.   No symptoms reported  Risk Assessment/Calculations:    CHA2DS2-VASc Score = 4   This indicates a 4.8% annual risk of stroke. The patient's score is based upon: CHF History: 0 HTN History: 1 Diabetes History: 0 Stroke History: 0 Vascular Disease History: 1 Age Score: 1 Gender Score: 1    HYPERTENSION CONTROL Vitals:   08/31/23 1529 08/31/23 1612  BP: (!) 150/70 (!) 140/72    The patient's blood pressure is elevated above target today.  In order to address the patient's elevated BP: A current anti-hypertensive medication was adjusted today.            Physical Exam:   VS:  BP (!) 140/72   Pulse 62   Ht 5' 5 (1.651 m)   Wt 212 lb (96.2 kg)   SpO2 96%   BMI 35.28 kg/m    Wt Readings from Last 3 Encounters:  08/31/23 212 lb (96.2 kg)  08/22/23 210 lb (95.3 kg)  08/07/23 209 lb 12.8 oz (95.2 kg)     GEN: Well nourished, well developed female appearing in no acute distress NECK: No JVD; No carotid bruits CARDIAC: RRR, no murmurs, rubs, gallops RESPIRATORY:  Clear to auscultation without rales, wheezing or rhonchi  ABDOMEN: Appears non-distended. No obvious abdominal masses. EXTREMITIES: No clubbing or cyanosis. No pitting edema.  Distal  pedal pulses are 2+ bilaterally.   Assessment and Plan:   1. DOE (dyspnea on exertion) - She has undergone extensive cardiac workup as discussed above and recent echocardiogram showed hyperdynamic LVEF and increased flow velocities as discussed above. Was intolerant to Farxiga  as she reports this caused vision changes and she self-discontinued the medication. NST showed no evidence of ischemia. Recent PFT's did show asthma and she does have upcoming follow-up with Pulmonology for further discussion of this. No indication for additional cardiac testing at this time.   2. History of Chest Pain/Coronary Calcification by CT/HLD - She has coronary artery calcification by prior CT and recent and NST last month showed no evidence of ischemia and was a low-risk study. Her episodes of pain overall seem atypical for a cardiac etiology.  - LDL was at 35 when checked in 07/2023. Continue current medical therapy with Atorvastatin  10 mg daily. She is not on ASA given the need for anticoagulation.  3. PAF (paroxysmal atrial fibrillation) (HCC) - Recent monitor showed predominantly normal sinus rhythm with rare PAC's and PVC's as discussed above and brief SVT. She is currently on Labetalol  200 mg twice daily. Given her elevated BP and brief palpitations, will titrate Labetalol   to 300mg  BID. We did review the importance of reducing her caffeine intake as well.  - She is on Eliquis  5 mg twice daily for anticoagulation which is the appropriate dose given her current age, weight and renal function. She has been taking both tablets in the morning and we reviewed the importance of dividing them out by 12 hours.   4. Essential hypertension - BP is at 140/72 during today's visit. Will titrate Labetalol  to 300mg  BID as discussed above.   5. PAD (peripheral artery disease) (HCC) - She has known carotid artery disease, renal artery stenosis and celiac artery stenosis. Carotid dopplers in 02/2021 showed 1-39% stenosis  bilaterally. She is scheduled to follow-up with Vascular in 12/2023.   Signed, Dorma Gash, PA-C

## 2023-08-31 NOTE — Patient Instructions (Signed)
 Medication Instructions:  Your physician has recommended you make the following change in your medication:   Increase Labetalol  to 300 mg Two Times Daily  Take Eliquis  5 mg in the AM and in the PM   *If you need a refill on your cardiac medications before your next appointment, please call your pharmacy*  Lab Work: NONE  If you have labs (blood work) drawn today and your tests are completely normal, you will receive your results only by: MyChart Message (if you have MyChart) OR A paper copy in the mail If you have any lab test that is abnormal or we need to change your treatment, we will call you to review the results.  Testing/Procedures: NONE   Follow-Up: At [facility], you and your health needs are our priority.  As part of our continuing mission to provide you with exceptional heart care, our providers are all part of one team.  This team includes your primary Cardiologist (physician) and Advanced Practice Providers or APPs (Physician Assistants and Nurse Practitioners) who all work together to provide you with the care you need, when you need it.  Your next appointment:   6 month(s)  Provider:   Vishnu Mallipeddi, MD    We recommend signing up for the patient portal called MyChart.  Sign up information is provided on this After Visit Summary.  MyChart is used to connect with patients for Virtual Visits (Telemedicine).  Patients are able to view lab/test results, encounter notes, upcoming appointments, etc.  Non-urgent messages can be sent to your provider as well.   To learn more about what you can do with MyChart, go to [email or web address].   Other Instructions Thank you for choosing [facility]!

## 2023-09-03 NOTE — Telephone Encounter (Signed)
Attempted to return call; unable to get through.

## 2023-09-03 NOTE — Telephone Encounter (Signed)
 lmtrc

## 2023-09-14 ENCOUNTER — Telehealth: Payer: Self-pay

## 2023-09-14 NOTE — Telephone Encounter (Signed)
 Copied from CRM [id number]. Topic: General - Other >> Sep 04, 2023  1:43 PM Whitney O wrote: Reason for CRM: jules from  pharmacy team from [facility]  sent over a fax yesterday in regards to the member inhaler asking doctor to put patient  on a maintenance inhaler and recommendation inhalers are on the faxed document  ms jules is looking for a response back within 3 days . Did relay that I seen the documents scanned on patients chart >> Sep 11, 2023  4:20 PM Leila BROCKS wrote: [place] from [facility] [phone] ext [phone] called 09/04/23 sent over a faxed 09/03/23 regards to the member inhaler asking doctor to put patient on a maintenance inhaler and recommendation inhalers. [name] has not heard from the office. Please advise and call back.  >> Sep 07, 2023 10:16 AM Isabell A wrote: Burman checking for an update - states she will call back next week.   ATC x1 regarding maintance inhaler

## 2023-09-17 ENCOUNTER — Telehealth: Payer: Self-pay

## 2023-09-17 NOTE — Telephone Encounter (Signed)
 Called and spoke with [facility]. Pt states she can not walk long distances without having SOB, coughing and wheezing issues. Pt states she has bad wheezing when laying down. Uses rescue albuterol  inhaler atleast 3x daily.  Dr. Darlean can you please advise pt would like maintenance inhaler.

## 2023-09-17 NOTE — Telephone Encounter (Signed)
 Copied from CRM [id number]. Topic: Clinical - Medication Question >> Sep 13, 2023 10:33 AM [name] wrote: Reason for CRM: [name] from pharmacy of patient health plan.  09/03/2023 requested script for maintenance inhaler for patient as patient is using rescue inhaler. Has called a couple times. Please call [phone] FAX: [phone]   ----------------------------------------------------------------------- From previous Reason for Contact - Other: Reason for CRM:  ATC LMTCB

## 2023-09-18 ENCOUNTER — Telehealth: Payer: Self-pay | Admitting: *Deleted

## 2023-09-18 MED ORDER — BUDESONIDE-FORMOTEROL FUMARATE 80-4.5 MCG/ACT IN AERO: 2.0000 | INHALATION_SPRAY | Freq: Two times a day (BID) | RESPIRATORY_TRACT | 12 refills | Status: AC

## 2023-09-18 NOTE — Telephone Encounter (Signed)
 Copied from CRM [id number]. Topic: General - Other >> Sep 17, 2023 10:13 AM [name] wrote: Reason for CRM: Burman from the Pharmacy Team [facility], checking the status on request. Please call and advise. [phone].  Symbicort RX sent to pharamcy [facility]

## 2023-09-18 NOTE — Telephone Encounter (Signed)
 Patient is calling returning call from nurse samantha concerning the crm that was sent yesterday . Transferred call to tanessa .

## 2023-09-18 NOTE — Telephone Encounter (Signed)
 Pt called in to cancel procedure for 7/10. Did not want to reschedule at this time. Aware to call back when ready

## 2023-09-18 NOTE — Telephone Encounter (Signed)
 Copied from CRM [id number]. Topic: Clinical - Medication Question >> Sep 13, 2023 10:33 AM [name] wrote: Reason for CRM: [name] from pharmacy of patient health plan.  09/03/2023 requested script for maintenance inhaler for patient as patient is using rescue inhaler. Has called a couple times. Please call [phone] ext [phone] FAX: [phone]   ----------------------------------------------------------------------- From previous Reason for Contact - Other: Reason for CRM:  Told pt that her symbicort was sent to pharmacy, [facility]

## 2023-09-18 NOTE — Telephone Encounter (Signed)
 PT ret your call. Please try again.

## 2023-09-18 NOTE — Telephone Encounter (Signed)
 ATC x1. Left detailed vm per DPR regarding symbicort inhaler has been sent to pharmacy. I verbalized in vm inhaler usage and advised to call back with any concerns. Nothing further needed.

## 2023-09-18 NOTE — Telephone Encounter (Signed)
 Copied from CRM [id number]. Topic: Clinical - Medication Question >> Sep 13, 2023 10:33 AM [name] wrote: Reason for CRM: [name] from pharmacy of patient health plan.  09/03/2023 requested script for maintenance inhaler for patient as patient is using rescue inhaler. Has called a couple times. Please call [phone] ext [phone] FAX: [phone]   ----------------------------------------------------------------------- From previous Reason for Contact - Other: Reason for CRM:

## 2023-09-24 ENCOUNTER — Encounter (HOSPITAL_COMMUNITY)

## 2023-09-27 ENCOUNTER — Encounter (HOSPITAL_COMMUNITY): Admission: RE | Payer: Self-pay | Source: Home / Self Care

## 2023-09-27 ENCOUNTER — Ambulatory Visit (HOSPITAL_COMMUNITY): Admission: RE | Admit: 2023-09-27 | Source: Home / Self Care | Admitting: Gastroenterology

## 2023-09-27 SURGERY — COLONOSCOPY
Anesthesia: Choice

## 2023-09-28 ENCOUNTER — Telehealth: Payer: Self-pay | Admitting: Internal Medicine

## 2023-09-28 NOTE — Telephone Encounter (Signed)
 Please advise Copied from CRM [id number]. Topic: General - Call Back - No Documentation >> Sep 28, 2023 10:13 AM Montie POUR wrote: Reason for CRM:  She had a missed call from clinic. Please call her back if needed. Thanks

## 2023-10-01 ENCOUNTER — Ambulatory Visit (HOSPITAL_COMMUNITY)

## 2023-10-03 NOTE — Progress Notes (Deleted)
 AMIYRAH LAMERE, female    DOB: [birth year]    MRN: [id number]   Brief patient profile:  66   yowf  active smoker  healthy referred to pulmonary clinic in [place]  07/16/2023 by Raphael Bring PA (card clinic ) for unexplained cp/ sob x Jan 2025    History of Present Illness  07/16/2023  Pulmonary/ 1st office eval/ [facility]  Chief Complaint  Patient presents with   Establish Care   Shortness of Breath  Dyspnea:  assoc with cp epigastric pain worse with exertion but no diaphoresis nausea or vomiting (she has N/V more recently not related  to cp/sob)  and also at rest x months  Cough: none Sleep: 2 pillows flat bed cp/ sob episodes don't typically  occur when supine  SABA use: not helpful  02: none  No change in cp freq or character p eating/ eructation or flatus.  Rec My office will be contacting you by phone for referral to GI (Dr  Golda or his practice in [place])   - if you don't hear back from my office within one week please call us  back or notify us  thru MyChart and we'll address it right away.  Continue dexilant  Take 30-60 min before first meal of the day and add pecid 20mg  (over the counter)  after supper   The key is to stop smoking completely before smoking completely stops you!  Pulmonary follow up is as needed - bring all medications with you if you need to return for any reason  - PFT's  08/21/23   FEV1 1.22 (52% % ) ratio 0.75  p 24 % improvement from saba p 0 prior to study with DLCO  13.66 (68%)   and FV curve mildly concave and ERV 1% at wt 209     10/04/2023  f/u ov/[facility]/Cola Gane re: cp/sob x Jan 2025 maint on ***  No chief complaint on file.   Dyspnea:  *** Cough: *** Sleeping: ***   resp cc  SABA use: *** 02: ***  Lung cancer screening: ***   No obvious day to day or daytime variability or assoc excess/ purulent sputum or mucus plugs or hemoptysis or cp or chest tightness, subjective wheeze or overt sinus or hb symptoms.    Also denies  any obvious fluctuation of symptoms with weather or environmental changes or other aggravating or alleviating factors except as outlined above   No unusual exposure hx or h/o childhood pna/ asthma or knowledge of premature birth.  Current Allergies, Complete Past Medical History, Past Surgical History, Family History, and Social History were reviewed in [employer] record.  ROS  The following are not active complaints unless bolded Hoarseness, sore throat, dysphagia, dental problems, itching, sneezing,  nasal congestion or discharge of excess mucus or purulent secretions, ear ache,   fever, chills, sweats, unintended wt loss or wt gain, classically pleuritic or exertional cp,  orthopnea pnd or arm/hand swelling  or leg swelling, presyncope, palpitations, abdominal pain, anorexia, nausea, vomiting, diarrhea  or change in bowel habits or change in bladder habits, change in stools or change in urine, dysuria, hematuria,  rash, arthralgias, visual complaints, headache, numbness, weakness or ataxia or problems with walking or coordination,  change in mood or  memory.        No outpatient medications have been marked as taking for the 10/04/23 encounter (Appointment) with Keryn Nessler B, MD.  Past Medical History:  Diagnosis Date   Anxiety    Arthritis 12-07-11   arthritis,DDD,spinal stenosis   Chronic back pain    COPD (chronic obstructive pulmonary disease) (HCC) 12-07-11   pt. uses nebulizer as needed and inhaler daily   Fibromyalgia    skin cancer   GERD (gastroesophageal reflux disease)    Hypertension 12-07-11   presently no meds   Normal cardiac stress test 06/2009   Skin cancer    melanoma.  face 2014   TIA (transient ischemic attack) ?      Objective:   Wts   10/04/2023        ***   08/31/23 212 lb (96.2 kg)  08/22/23 210 lb (95.3 kg)  08/07/23 209 lb 12.8 oz (95.2 kg)      Vital signs reviewed  10/04/2023  - Note at rest 02 sats  ***% on  ***   General appearance:    ***        Assessment

## 2023-10-04 ENCOUNTER — Encounter: Payer: Self-pay | Admitting: Internal Medicine

## 2023-10-04 ENCOUNTER — Ambulatory Visit: Admitting: Internal Medicine

## 2023-10-11 ENCOUNTER — Other Ambulatory Visit: Payer: Self-pay | Admitting: Internal Medicine

## 2023-10-11 DIAGNOSIS — M48062 Spinal stenosis, lumbar region with neurogenic claudication: Secondary | ICD-10-CM

## 2023-10-13 ENCOUNTER — Other Ambulatory Visit: Payer: Self-pay | Admitting: Family Medicine

## 2023-10-16 ENCOUNTER — Telehealth: Payer: Self-pay | Admitting: Internal Medicine

## 2023-10-16 NOTE — Telephone Encounter (Signed)
 Can you reroute Referral please?

## 2023-10-16 NOTE — Telephone Encounter (Signed)
 Patient here says pain clinic she was referred to does not accept her insurance wants to know if Dr Tobie can refer her to somewhere that does and fill her Rx until she is able to see them. Please advise  Thank you

## 2023-10-29 ENCOUNTER — Other Ambulatory Visit: Payer: Self-pay | Admitting: Internal Medicine

## 2023-10-31 ENCOUNTER — Other Ambulatory Visit: Payer: Self-pay | Admitting: Internal Medicine

## 2023-10-31 DIAGNOSIS — K219 Gastro-esophageal reflux disease without esophagitis: Secondary | ICD-10-CM

## 2023-11-28 ENCOUNTER — Ambulatory Visit: Admitting: Student in an Organized Health Care Education/Training Program

## 2023-12-03 ENCOUNTER — Ambulatory Visit (INDEPENDENT_AMBULATORY_CARE_PROVIDER_SITE_OTHER): Admitting: Gastroenterology

## 2023-12-03 ENCOUNTER — Encounter (INDEPENDENT_AMBULATORY_CARE_PROVIDER_SITE_OTHER): Payer: Self-pay | Admitting: Gastroenterology

## 2023-12-05 ENCOUNTER — Ambulatory Visit: Admitting: Student in an Organized Health Care Education/Training Program

## 2023-12-10 ENCOUNTER — Ambulatory Visit: Admitting: Internal Medicine

## 2023-12-17 ENCOUNTER — Other Ambulatory Visit: Payer: Self-pay

## 2023-12-17 DIAGNOSIS — I6523 Occlusion and stenosis of bilateral carotid arteries: Secondary | ICD-10-CM

## 2023-12-17 DIAGNOSIS — K551 Chronic vascular disorders of intestine: Secondary | ICD-10-CM

## 2023-12-25 ENCOUNTER — Ambulatory Visit: Admitting: Internal Medicine

## 2023-12-31 ENCOUNTER — Other Ambulatory Visit: Payer: Self-pay | Admitting: Internal Medicine

## 2023-12-31 DIAGNOSIS — F5104 Psychophysiologic insomnia: Secondary | ICD-10-CM

## 2024-01-02 ENCOUNTER — Other Ambulatory Visit: Payer: Self-pay | Admitting: Internal Medicine

## 2024-01-02 DIAGNOSIS — E782 Mixed hyperlipidemia: Secondary | ICD-10-CM

## 2024-01-02 MED ORDER — ATORVASTATIN CALCIUM 10 MG PO TABS: 10.0000 mg | ORAL_TABLET | Freq: Every day | ORAL | 0 refills | Status: AC

## 2024-01-02 NOTE — Telephone Encounter (Signed)
 Copied from CRM [id number]. Topic: Clinical - Medication Refill >> Jan 02, 2024  9:33 AM Montie POUR wrote: Medication:  atorvastatin  (LIPITOR) 10 MG tablet - 100 day supply  Has the patient contacted their pharmacy? Yes (Agent: If no, request that the patient contact the pharmacy for the refill. If patient does not wish to contact the pharmacy document the reason why and proceed with request.) (Agent: If yes, when and what did the pharmacy advise?) Pharmacy needs order to refill  This is the patient's preferred pharmacy:  [facility] - [place], [place] - [address] [place] [address] Phone: [phone] Fax: [phone]  Is this the correct pharmacy for this prescription? Yes If no, delete pharmacy and type the correct one.   Has the prescription been filled recently? No  Is the patient out of the medication? Yes  Has the patient been seen for an appointment in the last year OR does the patient have an upcoming appointment? Yes  Can we respond through MyChart? No  Agent: Please be advised that Rx refills may take up to 3 business days. We ask that you follow-up with your pharmacy.

## 2024-01-09 DIAGNOSIS — E669 Obesity, unspecified: Secondary | ICD-10-CM | POA: Diagnosis not present

## 2024-01-09 DIAGNOSIS — Z008 Encounter for other general examination: Secondary | ICD-10-CM | POA: Diagnosis not present

## 2024-01-09 DIAGNOSIS — R7303 Prediabetes: Secondary | ICD-10-CM | POA: Diagnosis not present

## 2024-01-09 DIAGNOSIS — Z6833 Body mass index (BMI) 33.0-33.9, adult: Secondary | ICD-10-CM | POA: Diagnosis not present

## 2024-01-09 DIAGNOSIS — N184 Chronic kidney disease, stage 4 (severe): Secondary | ICD-10-CM | POA: Diagnosis not present

## 2024-01-15 ENCOUNTER — Encounter

## 2024-01-15 ENCOUNTER — Encounter (HOSPITAL_COMMUNITY): Payer: Self-pay | Admitting: Surgery

## 2024-01-15 ENCOUNTER — Ambulatory Visit

## 2024-01-28 ENCOUNTER — Ambulatory Visit: Admitting: Internal Medicine

## 2024-01-28 ENCOUNTER — Encounter: Payer: Self-pay | Admitting: Internal Medicine

## 2024-01-28 VITALS — BP 123/72 | HR 100 | Ht 65.0 in | Wt 211.2 lb

## 2024-01-28 DIAGNOSIS — I4892 Unspecified atrial flutter: Secondary | ICD-10-CM

## 2024-01-28 DIAGNOSIS — N3281 Overactive bladder: Secondary | ICD-10-CM | POA: Diagnosis not present

## 2024-01-28 DIAGNOSIS — J9611 Chronic respiratory failure with hypoxia: Secondary | ICD-10-CM

## 2024-01-28 DIAGNOSIS — J449 Chronic obstructive pulmonary disease, unspecified: Secondary | ICD-10-CM

## 2024-01-28 DIAGNOSIS — F5104 Psychophysiologic insomnia: Secondary | ICD-10-CM

## 2024-01-28 DIAGNOSIS — I10 Essential (primary) hypertension: Secondary | ICD-10-CM

## 2024-01-28 DIAGNOSIS — F17210 Nicotine dependence, cigarettes, uncomplicated: Secondary | ICD-10-CM | POA: Diagnosis not present

## 2024-01-28 DIAGNOSIS — N1832 Chronic kidney disease, stage 3b: Secondary | ICD-10-CM

## 2024-01-28 DIAGNOSIS — M48062 Spinal stenosis, lumbar region with neurogenic claudication: Secondary | ICD-10-CM

## 2024-01-28 DIAGNOSIS — Z0001 Encounter for general adult medical examination with abnormal findings: Secondary | ICD-10-CM

## 2024-01-28 DIAGNOSIS — J42 Unspecified chronic bronchitis: Secondary | ICD-10-CM

## 2024-01-28 MED ORDER — OXYCODONE-ACETAMINOPHEN 5-325 MG PO TABS: 1.0000 | ORAL_TABLET | Freq: Two times a day (BID) | ORAL | 0 refills | Status: DC | PRN

## 2024-01-28 MED ORDER — PREGABALIN 50 MG PO CAPS: 50.0000 mg | ORAL_CAPSULE | Freq: Two times a day (BID) | ORAL | 3 refills | Status: AC

## 2024-01-28 NOTE — Assessment & Plan Note (Signed)
 Has COPD exacerbation Still has dyspnea and O2 desats upon ambulation, would benefit from home O2 Continue albuterol neb/inhaler as needed for dyspnea or wheezing

## 2024-01-28 NOTE — Patient Instructions (Addendum)
 Please take Trazodone  only half tablet at bedtime for now.  Please continue to take medications as prescribed.  Please continue to follow low salt diet and ambulate as tolerated.  Please take Percocet as needed for severe pain only.

## 2024-01-28 NOTE — Assessment & Plan Note (Signed)
 Needs to use DuoNeb regularly for now, usully more compliant and better tolerated for her than inhaler Better controlled now Would prefer to add maintenance inhaler, but were not cost effective Cheratussin as needed for cough

## 2024-01-28 NOTE — Assessment & Plan Note (Signed)
 Currently in sinus rhythm On labetalol for rate control and HTN, counseled for compliance On Eliquis, needs to take it twice daily

## 2024-01-28 NOTE — Assessment & Plan Note (Signed)
 BP Readings from Last 1 Encounters:  01/28/24 123/72   Well-controlled now On Labetalol  200 mg BID Needs to improve hydration Counseled for compliance with the medications Advised DASH diet and moderate exercise/walking as tolerated Follows up with Cardiologist

## 2024-01-28 NOTE — Progress Notes (Unsigned)
 Established Patient Office Visit  Subjective:  Patient ID: Paula Long, female    DOB: 04-14-1950  Age: 73 y.o. MRN: [id number]  CC:  Chief Complaint  Patient presents with   Hypertension    4 month f/u    Chronic Kidney Disease    4 month f/u    Urinary Frequency    Has been having urine frequency and bowel incontinence.     HPI Paula Long is a 73 y.o. 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  HTN: BP is wnl now.  Takes labetalol  200 mg twice daily currently.  Patient denies headache, dizziness, chest pain, or palpitations.  A Flutter: Denies dyspnea or palpitations. Takes labetalol  and Eliquis  regularly, although mentions having cold sensation while she takes Eliquis .  CKD stage III: She has seen nephrology.  She denies any dysuria, hematuria, urinary hesitancy or resistance. Last BMP showed GFR of 31, overall stable. She has chronic urinary incontinence, and takes desmopressin  0.2 mg nightly currently.  She complains of chronic low back pain and burning pain in her b/l LE.  She has chronic weakness of bilateral LE.  She was on Cymbalta  for neuropathy, but it did not work for her.  Of note, she used to go to pain clinic for lumbar radiculopathy, and take Dilaudid  4 mg twice daily, but states that it did not help her pain and has stopped taking it.  She recently took Percocet 5-325 mg with adequate relief.  Denies any recent fall or injury.  Denies any saddle anesthesia or stool incontinence. She was given Lyrica  in the past, but is not effective. She was referred to [facility] pain clinic, but has not heard from them yet.  COPD: She reports improvement in dyspnea upon minimal exertion. She denies any fever or chills.  She is currently using  albuterol  inhaler as needed for dyspnea, but has not needed it frequently.  She still smokes 1.5 pack/day. Her recent CT chest showing increase in size of pulmonary nodule, which is concerning for bronchogenic carcinoma. She has not had Pulmonology visit since CT chest results.  From previous visit: Her oxygen  saturation was 91% on room air in resting conditions.  Her oxygen  saturation dropped to 86% on ambulation on room air, but improved to 92% on 2 L O2.   Past Medical History:  Diagnosis Date   Anxiety    Arthritis 12-07-11   arthritis,DDD,spinal stenosis   Chronic back pain    COPD (chronic obstructive pulmonary disease) (HCC) 12-07-11   pt. uses nebulizer as needed and inhaler daily   Fibromyalgia    skin cancer   GERD (gastroesophageal reflux disease)    Hypertension 12-07-11   presently no meds   Normal cardiac stress test 06/2009   Skin cancer    melanoma.  face 2014   TIA (transient ischemic attack) ?    Past Surgical History:  Procedure Laterality Date   ABDOMINAL HYSTERECTOMY     BACK SURGERY  12-07-11   2'10 L5 x2   COLONOSCOPY WITH PROPOFOL  N/A 06/03/2014   Procedure: COLONOSCOPY WITH PROPOFOL ; IN CECUM AT 0750; TOTAL WITHDRAWAL TIME 16 MINUTES;  Surgeon: Claudis RAYMOND Rivet, MD;  Location: [facility];  Service: Endoscopy;  Laterality: N/A;   left ankle surgery     tendonitis  LUMBAR LAMINECTOMY/DECOMPRESSION MICRODISCECTOMY  12/11/2011   Procedure: LUMBAR LAMINECTOMY/DECOMPRESSION MICRODISCECTOMY;  Surgeon: Tanda DELENA Heading, MD;  Location: [facility];  Service: Orthopedics;  Laterality: Left;  Decompressive Lumbar Laminectomy L5-S1 on Left   POLYPECTOMY N/A 06/03/2014   Procedure: POLYPECTOMY;  Surgeon: Claudis RAYMOND Rivet, MD;  Location: [facility];  Service: Endoscopy;  Laterality: N/A;   right wrist surgery for pinched nerve     trigger finger surgery  12-07-11   rt. middle trigger finger release    Family History  Problem Relation Age of Onset   Cancer Mother        uterine    Cancer Father        lung    Social History   Socioeconomic History   Marital status: Single    Spouse name: Not on file   Number of children: Not on file   Years of education: Not on file   Highest education level: Not on file  Occupational History   Not on file  Tobacco Use   Smoking status: Every Day    Current packs/day: 1.50    Average packs/day: 1.5 packs/day for 54.1 years (81.1 ttl pk-yrs)    Types: Cigarettes    Start date: 01/07/1970   Smokeless tobacco: Never   Tobacco comments:    gone down to 1 pack per day   Vaping Use   Vaping status: Never Used  Substance and Sexual Activity   Alcohol use: No    Alcohol/week: 0.0 standard drinks of alcohol   Drug use: No   Sexual activity: Yes    Birth control/protection: Surgical  Other Topics Concern   Not on file  Social History Narrative   Not on file   Social Drivers of Health   Financial Resource Strain: Low Risk  (03/21/2022)   Overall Financial Resource Strain (CARDIA)    Difficulty of Paying Living Expenses: Not hard at all  Food Insecurity: No Food Insecurity (10/17/2021)   Hunger Vital Sign    Worried About Running Out of Food in the Last Year: Never true    Ran Out of Food in the Last Year: Never true  Transportation Needs: No Transportation Needs (03/21/2022)   PRAPARE - Administrator, Civil Service (Medical): No    Lack of Transportation (Non-Medical): No  Physical Activity: Inactive (10/17/2021)   Exercise Vital Sign    Days of Exercise per Week: 0 days    Minutes of Exercise per Session: 0 min  Stress: No Stress Concern Present (10/17/2021)   [employer] of Occupational Health - Occupational Stress Questionnaire    Feeling of Stress : Not at all  Social Connections: Moderately Isolated (10/17/2021)   Social Connection and Isolation Panel    Frequency of Communication with Friends and Family: More than three times a week    Frequency of Social Gatherings with Friends and Family: More  than three times a week    Attends Religious Services: Never    Database Administrator or Organizations: No    Attends Banker Meetings: Never    Marital Status: Living with partner  Intimate Partner Violence: Not At Risk (10/17/2021)   Humiliation, Afraid, Rape, and Kick questionnaire    Fear of Current or Ex-Partner: No    Emotionally Abused: No    Physically Abused: No    Sexually Abused: No    Outpatient Medications Prior to Visit  Medication Sig Dispense Refill   atorvastatin  (LIPITOR) 10 MG tablet Take 1  tablet (10 mg total) by mouth daily. 90 tablet 0   budesonide -formoterol  (SYMBICORT ) 80-4.5 MCG/ACT inhaler Inhale 2 puffs into the lungs in the morning and at bedtime. 1 each 12   desmopressin  (DDAVP ) 0.2 MG tablet Take 1 tablet (0.2 mg total) by mouth at bedtime. 90 tablet 3   dexlansoprazole  (DEXILANT ) 60 MG capsule Take 1 capsule (60 mg total) by mouth daily. 30 capsule 5   EASYMAX TEST test strip USE TO TEST ONCE DAILY. 100 each 3   ELIQUIS  5 MG TABS tablet TAKE ONE TABLET BY MOUTH 2 TIMES A DAY 60 tablet 11   ipratropium-albuterol  (DUONEB) 0.5-2.5 (3) MG/3ML SOLN Take 3 mLs by nebulization every 6 (six) hours as needed. 360 mL 5   labetalol  (NORMODYNE ) 300 MG tablet Take 1 tablet (300 mg total) by mouth 2 (two) times daily. 180 tablet 3   naloxegol  oxalate (MOVANTIK ) 25 MG TABS tablet Take 1 tablet (25 mg total) by mouth daily. 90 tablet 1   nitroGLYCERIN  (NITROSTAT ) 0.4 MG SL tablet Place 0.4 mg under the tongue every 5 (five) minutes x 3 doses as needed (if no relief after 2nd dose, proceed to ED or call 911).     oxyCODONE -acetaminophen  (PERCOCET/ROXICET) 5-325 MG tablet Take 1 tablet by mouth every 12 (twelve) hours as needed for severe pain (pain score 7-10). 60 tablet 0   pregabalin  (LYRICA ) 50 MG capsule Take 1 capsule (50 mg total) by mouth at bedtime. 30 capsule 3   traZODone  (DESYREL ) 150 MG tablet TAKE (1) TABLET BY MOUTH AT BEDTIME. 90 tablet 1    VENTOLIN  HFA 108 (90 Base) MCG/ACT inhaler INHALE 2 PUFFS INTO THE LUNGS EVERY 6 HOURS AS NEEDED FOR WHEEZING OR SHORTNESS OF BREATH 18 g 2   Vibegron  (GEMTESA ) 75 MG TABS Take 1 tablet (75 mg total) by mouth daily. 30 tablet 3   No facility-administered medications prior to visit.    Allergies  Allergen Reactions   Farxiga  [Dapagliflozin ]     Reported vision loss with the medication   Hydrocodone-Acetaminophen     Sulfa Antibiotics    Nystatin  Rash    Oral rash   Percocet [Oxycodone -Acetaminophen ] Rash    Oral rash   Vesicare  [Solifenacin ] Rash   Vicodin [Hydrocodone-Acetaminophen ] Rash    Oral rash    ROS Review of Systems  Constitutional:  Negative for chills and fever.  HENT:  Negative for congestion, sinus pressure, sinus pain and sore throat.   Eyes:  Negative for pain and discharge.  Respiratory:  Positive for cough (Chronic) and shortness of breath (Intermittent).   Cardiovascular:  Negative for chest pain and palpitations.  Gastrointestinal:  Negative for abdominal pain, diarrhea, nausea and vomiting.  Endocrine: Negative for polydipsia and polyuria.  Genitourinary:  Negative for dysuria and hematuria.  Musculoskeletal:  Positive for arthralgias and back pain. Negative for neck pain and neck stiffness.  Skin:  Negative for wound.  Neurological:  Positive for weakness (B/l LE). Negative for dizziness, syncope and headaches.  Psychiatric/Behavioral:  Positive for sleep disturbance. Negative for agitation and behavioral problems.       Objective:    Physical Exam Vitals reviewed.  Constitutional:      General: She is not in acute distress.    Appearance: She is not diaphoretic.  HENT:     Head: Normocephalic and atraumatic.     Nose: Nose normal.     Mouth/Throat:     Mouth: Mucous membranes are moist.  Eyes:     General: No  scleral icterus.    Extraocular Movements: Extraocular movements intact.  Cardiovascular:     Rate and Rhythm: Normal rate and  regular rhythm.     Pulses: Normal pulses.     Heart sounds: Murmur (Systolic, right upper sternal border) heard.  Pulmonary:     Breath sounds: No wheezing or rales.  Musculoskeletal:        General: Tenderness (Lumbar spinal area) present.     Cervical back: Neck supple. No tenderness.     Right lower leg: No edema.     Left lower leg: No edema.  Skin:    General: Skin is warm and dry.  Neurological:     General: No focal deficit present.     Mental Status: She is alert and oriented to person, place, and time.     Sensory: No sensory deficit.     Motor: Weakness (3/5 in b/l LE, 4/5 in b/l UE) present.     Gait: Gait abnormal (Slow, unsteady).  Psychiatric:        Mood and Affect: Mood normal.        Behavior: Behavior normal.     BP 123/72   Pulse 100   Ht 5' 5 (1.651 m)   Wt 211 lb 3.2 oz (95.8 kg)   SpO2 96%   BMI 35.15 kg/m  Wt Readings from Last 3 Encounters:  01/28/24 211 lb 3.2 oz (95.8 kg)  08/31/23 212 lb (96.2 kg)  08/22/23 210 lb (95.3 kg)    Lab Results  Component Value Date   TSH 1.850 08/03/2023   Lab Results  Component Value Date   WBC 7.5 06/11/2023   HGB 13.0 06/11/2023   HCT 40.5 06/11/2023   MCV 96.7 06/11/2023   PLT 258 06/11/2023   Lab Results  Component Value Date   NA 137 06/11/2023   K 4.4 06/11/2023   CO2 25 06/11/2023   GLUCOSE 105 (H) 06/11/2023   BUN 23 06/11/2023   CREATININE 1.55 (H) 06/11/2023   BILITOT 0.3 06/11/2023   ALKPHOS 68 06/11/2023   AST 15 06/11/2023   ALT 15 06/11/2023   PROT 7.4 06/11/2023   ALBUMIN 3.7 06/11/2023   CALCIUM  9.2 06/11/2023   ANIONGAP 9 06/11/2023   EGFR 36 (L) 01/18/2023   Lab Results  Component Value Date   CHOL 102 08/03/2023   Lab Results  Component Value Date   HDL 54 08/03/2023   Lab Results  Component Value Date   LDLCALC 35 08/03/2023   Lab Results  Component Value Date   TRIG 58 08/03/2023   Lab Results  Component Value Date   CHOLHDL 1.9 08/03/2023   Lab  Results  Component Value Date   HGBA1C 5.8 (H) 08/03/2023      Assessment & Plan:   Problem List Items Addressed This Visit   None     No orders of the defined types were placed in this encounter.   Follow-up: No follow-ups on file.    Suzzane MARLA Blanch, MD

## 2024-01-28 NOTE — Assessment & Plan Note (Signed)
 CKD likely due to h/o uncontrolled HTN Avoid nephrotoxic agents including NSAIDs Maintain adequate hydration Followed by nephrology - Dr. Rachele

## 2024-01-29 ENCOUNTER — Telehealth: Payer: Self-pay | Admitting: Pharmacy Technician

## 2024-01-29 ENCOUNTER — Encounter: Payer: Self-pay | Admitting: Internal Medicine

## 2024-01-29 ENCOUNTER — Other Ambulatory Visit (HOSPITAL_COMMUNITY): Payer: Self-pay

## 2024-01-29 NOTE — Telephone Encounter (Signed)
 Pharmacy Patient Advocate Encounter  Received notification from [facility] that Prior Authorization for oxyCODONE -Acetaminophen  5-325MG  tablets has been APPROVED from 01/29/2024 to 01/28/2025. Unable to obtain price due to refill too soon rejection, last fill date 01/29/2024 next available fill date12/08/2023.   PA #/Case ID/Reference #: [id number]

## 2024-01-29 NOTE — Assessment & Plan Note (Signed)
 Smokes about 0.5 pack/day, has cut down from 2 packs/day  Asked about quitting: confirms that he/she currently smokes cigarettes Advise to quit smoking: Educated about QUITTING to reduce the risk of cancer, cardio and cerebrovascular disease. Assess willingness: Unwilling to quit at this time, but is working on cutting back. Assist with counseling and pharmacotherapy: Counseled for 5 minutes and literature provided. Arrange for follow up: follow up in 3 months and continue to offer help.

## 2024-01-29 NOTE — Assessment & Plan Note (Signed)
 Physical exam as documented. Planned to get blood tests with nephrology. Needs to cut down to quit smoking. Advised to get Shingrix vaccine at the local pharmacy.

## 2024-01-29 NOTE — Assessment & Plan Note (Addendum)
 Uncontrolled with desmopressin  0.2 mg daily But she complains of overactive bladder and urge incontinence lately, added Gemtesa , but she could not continue due to cost She has tried oxybutynin , Vesicare , Toviaz  and Myrbetriq  in the past Referred to OB/GYN for discussion of pessary placement, unable to go to [facility] for urogynecology consultation

## 2024-01-29 NOTE — Assessment & Plan Note (Signed)
 On Trazodone  150 mg QD Due to hypersomnia, advised to take trazodone  half tablet qHS for now

## 2024-01-29 NOTE — Telephone Encounter (Signed)
 Pharmacy Patient Advocate Encounter   Received notification from CoverMyMeds that prior authorization for oxyCODONE -Acetaminophen  5-325MG  tablets is required/requested.   Insurance verification completed.   The patient is insured through [employer].   Per test claim: PA required; PA submitted to above mentioned insurance via Latent Key/confirmation #/EOC [id number] Status is pending

## 2024-01-29 NOTE — Assessment & Plan Note (Signed)
 S/p lumbar spine surgeries Has chronic low back pain, used to see [facility] On Lyrica  50 mg qHS Due to persistent low back pain, started Percocet 5-325 mg q12h PRN in the last visit, refilled for now

## 2024-01-30 ENCOUNTER — Other Ambulatory Visit (HOSPITAL_COMMUNITY): Payer: Self-pay

## 2024-02-11 ENCOUNTER — Telehealth: Payer: Self-pay

## 2024-02-11 ENCOUNTER — Other Ambulatory Visit: Payer: Self-pay | Admitting: Internal Medicine

## 2024-02-11 DIAGNOSIS — R7303 Prediabetes: Secondary | ICD-10-CM

## 2024-02-11 DIAGNOSIS — N1832 Chronic kidney disease, stage 3b: Secondary | ICD-10-CM

## 2024-02-11 DIAGNOSIS — J42 Unspecified chronic bronchitis: Secondary | ICD-10-CM

## 2024-02-11 DIAGNOSIS — E782 Mixed hyperlipidemia: Secondary | ICD-10-CM

## 2024-02-11 DIAGNOSIS — I10 Essential (primary) hypertension: Secondary | ICD-10-CM

## 2024-02-11 NOTE — Telephone Encounter (Signed)
 Copied from CRM [id number]. Topic: Clinical - Request for Lab/Test Order >> Feb 11, 2024 10:20 AM Deaijah H wrote: Reason for CRM: Patient called in looking for her blood work for her kidneys, I do not show any orders for. Would like to speak with someone Please call [phone]

## 2024-02-11 NOTE — Telephone Encounter (Signed)
 Copied from CRM #[id number]. Topic: Clinical - Request for Lab/Test Order >> Feb 11, 2024  2:50 PM Olam RAMAN wrote: Reason for CRM: Pt was calling about her lab work for kidneys. Advised kidney dr faxed over document and wanted  to know if it was rcvd yet. I advised I did not see anything as of yet and if rcvd provider would reach out to provider and or pt

## 2024-02-13 ENCOUNTER — Other Ambulatory Visit: Payer: Self-pay | Admitting: Internal Medicine

## 2024-02-13 DIAGNOSIS — J418 Mixed simple and mucopurulent chronic bronchitis: Secondary | ICD-10-CM

## 2024-03-05 ENCOUNTER — Ambulatory Visit: Admitting: Adult Health

## 2024-03-05 ENCOUNTER — Encounter: Payer: Self-pay | Admitting: Adult Health

## 2024-03-05 VITALS — BP 160/74 | HR 64 | Ht 65.0 in | Wt 209.0 lb

## 2024-03-05 DIAGNOSIS — Z90710 Acquired absence of both cervix and uterus: Secondary | ICD-10-CM | POA: Insufficient documentation

## 2024-03-05 DIAGNOSIS — Z1331 Encounter for screening for depression: Secondary | ICD-10-CM | POA: Diagnosis not present

## 2024-03-05 DIAGNOSIS — N3941 Urge incontinence: Secondary | ICD-10-CM | POA: Insufficient documentation

## 2024-03-05 DIAGNOSIS — N3281 Overactive bladder: Secondary | ICD-10-CM

## 2024-03-05 DIAGNOSIS — N993 Prolapse of vaginal vault after hysterectomy: Secondary | ICD-10-CM | POA: Diagnosis not present

## 2024-03-05 DIAGNOSIS — N8110 Cystocele, unspecified: Secondary | ICD-10-CM | POA: Insufficient documentation

## 2024-03-05 DIAGNOSIS — I10 Essential (primary) hypertension: Secondary | ICD-10-CM | POA: Diagnosis not present
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
## 2024-03-07 ENCOUNTER — Other Ambulatory Visit: Payer: Self-pay | Admitting: Internal Medicine

## 2024-03-07 DIAGNOSIS — M48062 Spinal stenosis, lumbar region with neurogenic claudication: Secondary | ICD-10-CM

## 2024-03-07 NOTE — Telephone Encounter (Signed)
 Copied from CRM [id number]. Topic: Clinical - Medication Refill >> Mar 07, 2024 12:45 PM Delon HERO wrote: Medication: oxyCODONE -acetaminophen  (PERCOCET/ROXICET) 5-325 MG tablet [[id number]]  Has the patient contacted their pharmacy? Yes (Agent: If no, request that the patient contact the pharmacy for the refill. If patient does not wish to contact the pharmacy document the reason why and proceed with request.) (Agent: If yes, when and what did the pharmacy advise?)  This is the patient's preferred pharmacy:  [facility] - [place], [place] - [address] [place] [address] Phone: [phone] Fax: [phone]   Is this the correct pharmacy for this prescription? Yes If no, delete pharmacy and type the correct one.   Has the prescription been filled recently? Yes  Is the patient out of the medication? Yes  Has the patient been seen for an appointment in the last year OR does the patient have an upcoming appointment? Yes  Can we respond through MyChart? Yes  Agent: Please be advised that Rx refills may take up to 3 business days. We ask that you follow-up with your pharmacy.

## 2024-03-10 MED ORDER — OXYCODONE-ACETAMINOPHEN 5-325 MG PO TABS: 1.0000 | ORAL_TABLET | Freq: Two times a day (BID) | ORAL | 0 refills | Status: DC | PRN

## 2024-04-07 ENCOUNTER — Telehealth: Payer: Self-pay

## 2024-04-07 NOTE — Telephone Encounter (Signed)
 Copied from CRM [id number]. Topic: Clinical - Medication Refill >> Apr 07, 2024  2:34 PM Paula Long wrote: Medication: oxyCODONE -acetaminophen  (PERCOCET/ROXICET) 5-325 MG tablet  Has the patient contacted their pharmacy? Yes (Agent: If no, request that the patient contact the pharmacy for the refill. If patient does not wish to contact the pharmacy document the reason why and proceed with request.) (Agent: If yes, when and what did the pharmacy advise?)  This is the patient's preferred pharmacy:  [facility] - [place], [place] - [address] [place] [address] Phone: [phone] Fax: [phone]    Is this the correct pharmacy for this prescription? Yes If no, delete pharmacy and type the correct one.   Has the prescription been filled recently? Yes  Is the patient out of the medication? No  Has the patient been seen for an appointment in the last year OR does the patient have an upcoming appointment? Yes  Can we respond through MyChart? No  Agent: Please be advised that Rx refills may take up to 3 business days. We ask that you follow-up with your pharmacy.

## 2024-04-22 ENCOUNTER — Telehealth: Payer: Self-pay

## 2024-04-22 NOTE — Telephone Encounter (Signed)
 Copied from CRM [id number]. Topic: Clinical - Medication Refill >> Apr 22, 2024 12:00 PM Yolanda T wrote: Patient took last dosage 3 days ago  Medication: oxyCODONE -acetaminophen  (PERCOCET/ROXICET) 5-325 MG tablet  Has the patient contacted their pharmacy? Yes (Agent: If no, request that the patient contact the pharmacy for the refill. If patient does not wish to contact the pharmacy document the reason why and proceed with request.) (Agent: If yes, when and what did the pharmacy advise?)  This is the patient's preferred pharmacy:  [facility] - [place], [place] - [address] [place] [address] Phone: [phone] Fax: [phone]  Is this the correct pharmacy for this prescription? Yes  Has the prescription been filled recently? Yes  Is the patient out of the medication? Yes  Has the patient been seen for an appointment in the last year OR does the patient have an upcoming appointment? Yes  Can we respond through MyChart? No  Agent: Please be advised that Rx refills may take up to 3 business days. We ask that you follow-up with your pharmacy.

## 2024-04-23 ENCOUNTER — Other Ambulatory Visit: Payer: Self-pay | Admitting: Internal Medicine

## 2024-04-23 ENCOUNTER — Ambulatory Visit: Payer: Self-pay

## 2024-04-23 DIAGNOSIS — M48062 Spinal stenosis, lumbar region with neurogenic claudication: Secondary | ICD-10-CM

## 2024-04-23 MED ORDER — OXYCODONE-ACETAMINOPHEN 5-325 MG PO TABS: 1.0000 | ORAL_TABLET | Freq: Two times a day (BID) | ORAL | 0 refills | Status: AC | PRN

## 2024-04-23 NOTE — Telephone Encounter (Signed)
 Patient advised

## 2024-04-23 NOTE — Telephone Encounter (Signed)
 FYI Only or Action Required?: Action required by provider: update on patient condition.  Patient was last seen in primary care on 01/28/2024 by Tobie Suzzane POUR, MD.  Called Nurse Triage reporting Back Pain.  Symptoms began yesterday.  Interventions attempted: Prescription medications: percocet.  Symptoms are: gradually worsening.  Triage Disposition: See HCP Within 4 Hours (Or PCP Triage)- pt stated she wanted to see PCP earlier. No openings in clinic- Made appt at [facility] with Dr Duanne 04/24/24  Patient/caregiver understands and will follow disposition?:       Message from [place] R sent at 04/23/2024  3:13 PM EST  Extreme back pain effecting her ability to walk, can't wait until appointment in March   Reason for Disposition  [1] SEVERE back pain (e.g., excruciating, unable to do any normal activities) AND [2] not improved 2 hours after pain medicine  Answer Assessment - Initial Assessment Questions 1. ONSET: When did the pain begin? (e.g., minutes, hours, days)     Yesterday cannot stand up straight 2. LOCATION: Where does it hurt? (upper, mid or lower back)     Lower back  3. SEVERITY: How bad is the pain?  (e.g., Scale 1-10; mild, moderate, or severe)     9/10 4. PATTERN: Is the pain constant? (e.g., yes, no; constant, intermittent)      yes 5. RADIATION: Does the pain shoot into your legs or somewhere else?     Left leg  6. CAUSE:  What do you think is causing the back pain?      H/o back surgeries  7. BACK OVERUSE:  Any recent lifting of heavy objects, strenuous work or exercise?     no 8. MEDICINES: What have you taken so far for the pain? (e.g., nothing, acetaminophen , NSAIDS)     Oxycodone  5 mg  9. NEUROLOGIC SYMPTOMS: Do you have any weakness, numbness, or problems with bowel/bladder control?     Wears pull up past months  10. OTHER SYMPTOMS: Do you have any other symptoms? (e.g., fever, abdomen pain, burning with urination, blood in urine)        no 11. PREGNANCY: Is there any chance you are pregnant? When was your last menstrual period?       N/a  Protocols used: Back Pain-A-AH

## 2024-04-23 NOTE — Telephone Encounter (Signed)
 noted

## 2024-04-24 ENCOUNTER — Ambulatory Visit: Admitting: Family Medicine

## 2024-04-25 ENCOUNTER — Ambulatory Visit: Payer: Self-pay

## 2024-04-25 ENCOUNTER — Ambulatory Visit: Admitting: Family Medicine
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
## 2024-04-28 ENCOUNTER — Ambulatory Visit: Payer: Self-pay | Admitting: Internal Medicine

## 2024-04-28 ENCOUNTER — Ambulatory Visit: Admitting: Family Medicine

## 2024-05-26 ENCOUNTER — Ambulatory Visit: Admitting: Internal Medicine
